# Patient Record
Sex: Female | Born: 1950 | Race: White | Hispanic: No | Marital: Married | Smoking: Never smoker
Health system: Southern US, Community
[De-identification: ages and names within clinical notes are randomized; demographics above are authoritative.]

## PROBLEM LIST (undated history)

## (undated) DIAGNOSIS — I10 Essential (primary) hypertension: Secondary | ICD-10-CM

## (undated) DIAGNOSIS — B019 Varicella without complication: Secondary | ICD-10-CM

## (undated) DIAGNOSIS — F419 Anxiety disorder, unspecified: Secondary | ICD-10-CM

## (undated) DIAGNOSIS — Z923 Personal history of irradiation: Secondary | ICD-10-CM

## (undated) DIAGNOSIS — J45909 Unspecified asthma, uncomplicated: Secondary | ICD-10-CM

## (undated) DIAGNOSIS — K219 Gastro-esophageal reflux disease without esophagitis: Secondary | ICD-10-CM

## (undated) DIAGNOSIS — E785 Hyperlipidemia, unspecified: Secondary | ICD-10-CM

## (undated) DIAGNOSIS — Z95828 Presence of other vascular implants and grafts: Secondary | ICD-10-CM

## (undated) DIAGNOSIS — F32A Depression, unspecified: Secondary | ICD-10-CM

## (undated) DIAGNOSIS — T7840XA Allergy, unspecified, initial encounter: Secondary | ICD-10-CM

## (undated) DIAGNOSIS — M1990 Unspecified osteoarthritis, unspecified site: Secondary | ICD-10-CM

## (undated) DIAGNOSIS — C259 Malignant neoplasm of pancreas, unspecified: Secondary | ICD-10-CM

## (undated) DIAGNOSIS — F329 Major depressive disorder, single episode, unspecified: Secondary | ICD-10-CM

## (undated) DIAGNOSIS — K635 Polyp of colon: Secondary | ICD-10-CM

## (undated) DIAGNOSIS — B029 Zoster without complications: Secondary | ICD-10-CM

## (undated) HISTORY — DX: Depression, unspecified: F32.A

## (undated) HISTORY — DX: Essential (primary) hypertension: I10

## (undated) HISTORY — DX: Hyperlipidemia, unspecified: E78.5

## (undated) HISTORY — DX: Unspecified asthma, uncomplicated: J45.909

## (undated) HISTORY — PX: BREAST REDUCTION SURGERY: SHX8

## (undated) HISTORY — DX: Unspecified osteoarthritis, unspecified site: M19.90

## (undated) HISTORY — DX: Varicella without complication: B01.9

## (undated) HISTORY — DX: Allergy, unspecified, initial encounter: T78.40XA

## (undated) HISTORY — DX: Polyp of colon: K63.5

## (undated) HISTORY — DX: Major depressive disorder, single episode, unspecified: F32.9

## (undated) HISTORY — PX: OTHER SURGICAL HISTORY: SHX169

---

## 2000-03-17 ENCOUNTER — Encounter: Admission: RE | Admit: 2000-03-17 | Discharge: 2000-03-17 | Payer: Self-pay | Admitting: Obstetrics and Gynecology

## 2000-03-17 ENCOUNTER — Encounter: Payer: Self-pay | Admitting: Obstetrics and Gynecology

## 2000-05-06 ENCOUNTER — Other Ambulatory Visit: Admission: RE | Admit: 2000-05-06 | Discharge: 2000-05-06 | Payer: Self-pay | Admitting: Obstetrics and Gynecology

## 2000-05-06 ENCOUNTER — Encounter (INDEPENDENT_AMBULATORY_CARE_PROVIDER_SITE_OTHER): Payer: Self-pay

## 2001-04-22 ENCOUNTER — Ambulatory Visit (HOSPITAL_COMMUNITY): Admission: RE | Admit: 2001-04-22 | Discharge: 2001-04-22 | Payer: Self-pay | Admitting: Obstetrics and Gynecology

## 2001-04-22 ENCOUNTER — Encounter (INDEPENDENT_AMBULATORY_CARE_PROVIDER_SITE_OTHER): Payer: Self-pay

## 2002-02-18 ENCOUNTER — Encounter: Admission: RE | Admit: 2002-02-18 | Discharge: 2002-02-18 | Payer: Self-pay | Admitting: Obstetrics and Gynecology

## 2002-02-18 ENCOUNTER — Encounter: Payer: Self-pay | Admitting: Obstetrics and Gynecology

## 2003-03-28 ENCOUNTER — Encounter: Admission: RE | Admit: 2003-03-28 | Discharge: 2003-03-28 | Payer: Self-pay | Admitting: Obstetrics and Gynecology

## 2003-03-28 ENCOUNTER — Encounter: Payer: Self-pay | Admitting: Obstetrics and Gynecology

## 2003-12-02 HISTORY — PX: HYSTEROSCOPY: SHX211

## 2005-08-21 ENCOUNTER — Encounter: Admission: RE | Admit: 2005-08-21 | Discharge: 2005-08-21 | Payer: Self-pay | Admitting: Obstetrics and Gynecology

## 2007-04-07 ENCOUNTER — Encounter: Admission: RE | Admit: 2007-04-07 | Discharge: 2007-04-07 | Payer: Self-pay | Admitting: Family Medicine

## 2008-09-15 ENCOUNTER — Encounter: Admission: RE | Admit: 2008-09-15 | Discharge: 2008-09-15 | Payer: Self-pay | Admitting: Family Medicine

## 2011-04-11 ENCOUNTER — Other Ambulatory Visit: Payer: Self-pay | Admitting: Obstetrics & Gynecology

## 2011-04-11 DIAGNOSIS — Z1231 Encounter for screening mammogram for malignant neoplasm of breast: Secondary | ICD-10-CM

## 2011-04-18 ENCOUNTER — Ambulatory Visit
Admission: RE | Admit: 2011-04-18 | Discharge: 2011-04-18 | Disposition: A | Discharge status: Home / Self Care | Payer: Medicare HMO | Source: Ambulatory Visit | Attending: Obstetrics & Gynecology | Admitting: Obstetrics & Gynecology

## 2011-04-18 DIAGNOSIS — Z1231 Encounter for screening mammogram for malignant neoplasm of breast: Secondary | ICD-10-CM

## 2011-04-18 NOTE — H&P (Signed)
[facility]  Patient:    DYMPHNA, Sonya Larson                        MRN: [id number] Adm. Date:  04/22/01 Attending:  Lenoard Aden, M.D.                         History and Physical  CHIEF COMPLAINT:              Postmenopausal bleeding.  HISTORY OF PRESENT ILLNESS:   Patient is a 60 year old white female G2, P2 with known history of symptomatic pelvic relaxation, rectocele, cystocele who presents with dysfunctional uterine bleeding.  Has a history of a normal endometrial biopsy and a saline sonohysterography which is consistent with posterior uterine thickening.  PAST MEDICAL HISTORY:         Remarkable for hypertension and asthma.  MEDICATIONS:                  Diovan, Claritin, Flovent, Serevent.  ALLERGIES:                    No known drug allergies.  FAMILY HISTORY:               Otherwise noncontributory.  PAST OBSTETRICAL HISTORY:     Two spontaneous vaginal deliveries.  PAST SURGICAL HISTORY:        None.  PHYSICAL EXAMINATION  GENERAL:                      She is a well-developed, well-nourished white female in no apparent distress.  HEENT:                        Normal.  LUNGS:                        Clear.  HEART:                        Regular rate and rhythm.  ABDOMEN:                      Soft, nontender.  PELVIC:                       Grade I cystocele, rectocele, and small amount of uterine descensus.  Uterus is small and anteflexed.  No adnexal masses are appreciated.  EXTREMITIES:                  No cords.  NEUROLOGIC:                   Nonfocal.  IMPRESSION:                   Postmenopausal bleeding with thickened endometrium, normal EBX, questionable structural lesion on saline sonohysterography.  PLAN:                         Proceed with diagnostic hysteroscopy/resectoscope.  Risks of anesthesia, infection, bleeding, injury to abdominal organs, need for repair is discussed.  Risks of uterine perforation  noted.  Patient acknowledges and will proceed with hysteroscopy and D&C. DD:  04/22/01 TD:  04/22/01 Job: [id number]

## 2011-04-18 NOTE — Op Note (Signed)
[facility]  Patient:    Sonya Larson, Sonya Larson                      MRN: [id number] Proc. Date: 04/22/01 Adm. Date:  [id number] Attending:  Lenoard Aden CC:         [facility]   Operative Report  PREOPERATIVE DIAGNOSIS:         Postmenopausal bleeding.  POSTOPERATIVE DIAGNOSIS:        Postoperative thickened endometrioma.  OPERATION:                      Diagnostic hysterectomy with dilatation and curettage.  SURGEON:                        Lenoard Aden, M.D.  ANESTHESIA:                     MAC with paracervical.  ESTIMATED BLOOD LOSS:           Less than 50 cc.  COMPLICATIONS:                  None.  FLUID DEFICIT:                  O.  DISPOSITION:                    Patient to recovery in good condition.  FINDINGS:                       Thickened endometrium.  Copious amounts of tissue on D&C specimen.  Normal tubal ostia.  No obvious structural intrauterine lesions.  DESCRIPTION OF PROCEDURE:       After being apprised of the risks of anesthesia, infection, bleeding, injury to abdominal organs, need for repair, uterine perforation with bowel and bladder injury and need for repair noted, the patient acknowledges.  Consents are signed.  The patient is brought to the operating room where she was administered IV sedation without difficulty. Prepped and draped in the usual sterile fashion.  Paracervical block placed using 25 cc of dilute Xylocaine solution in the standard fashion and dilute Pitressin solution, 12 cc placed in the cervicovaginal junction in the usual fashion.  At this time the anterior lip of the cervix was grasped using a single-tooth tenaculum.  The uterus sounded to 10 cm, dilated easily up to a #27 Pratt dilator.  Diagnostic hysteroscope placed through visualizing a very lush thickened endometrium but no obvious structural lesions.  D&C using a serrated Kevorkian curet is performed revealing copious amounts of  tissue for the D&C specimen.  Revisualization with the hysteroscope reveals an otherwise normal endometrial cavity and normal tubal ostia.  Fluid deficit is 0 as noted.  The procedure was terminated.  All instruments were removed.  The patient tolerated the procedure well and was transferred to recovery in good condition. DD:  04/22/01 TD:  04/22/01 Job: [id number]

## 2011-11-05 ENCOUNTER — Other Ambulatory Visit: Payer: Self-pay | Admitting: Occupational Medicine

## 2011-11-05 ENCOUNTER — Ambulatory Visit
Admission: RE | Admit: 2011-11-05 | Discharge: 2011-11-05 | Disposition: A | Discharge status: Home / Self Care | Payer: Medicare HMO | Source: Ambulatory Visit | Attending: Occupational Medicine | Admitting: Occupational Medicine

## 2011-11-05 DIAGNOSIS — Z0000 Encounter for general adult medical examination without abnormal findings: Secondary | ICD-10-CM

## 2013-01-26 ENCOUNTER — Other Ambulatory Visit: Payer: Self-pay

## 2013-01-26 DIAGNOSIS — Z1231 Encounter for screening mammogram for malignant neoplasm of breast: Secondary | ICD-10-CM

## 2013-02-28 ENCOUNTER — Ambulatory Visit
Admission: RE | Admit: 2013-02-28 | Discharge: 2013-02-28 | Disposition: A | Discharge status: Home / Self Care | Payer: BC Managed Care – PPO | Source: Ambulatory Visit

## 2013-02-28 DIAGNOSIS — Z1231 Encounter for screening mammogram for malignant neoplasm of breast: Secondary | ICD-10-CM

## 2013-12-21 ENCOUNTER — Telehealth: Payer: Self-pay | Admitting: Family Medicine

## 2013-12-21 NOTE — Telephone Encounter (Signed)
Pt used to be a pt of yours at [facility]. Would like to know if you will accept her back as a pt?

## 2013-12-21 NOTE — Telephone Encounter (Signed)
done

## 2013-12-21 NOTE — Telephone Encounter (Signed)
yes

## 2014-01-23 ENCOUNTER — Encounter: Payer: Self-pay | Admitting: Family Medicine

## 2014-01-23 ENCOUNTER — Ambulatory Visit (INDEPENDENT_AMBULATORY_CARE_PROVIDER_SITE_OTHER): Payer: BC Managed Care – PPO | Admitting: Family Medicine

## 2014-01-23 VITALS — BP 124/88 | HR 90 | Temp 98.6°F | Ht 63.5 in | Wt 210.0 lb

## 2014-01-23 DIAGNOSIS — J45998 Other asthma: Secondary | ICD-10-CM | POA: Insufficient documentation

## 2014-01-23 DIAGNOSIS — M79672 Pain in left foot: Secondary | ICD-10-CM

## 2014-01-23 DIAGNOSIS — Z860100 Personal history of colon polyps, unspecified: Secondary | ICD-10-CM

## 2014-01-23 DIAGNOSIS — Z8659 Personal history of other mental and behavioral disorders: Secondary | ICD-10-CM

## 2014-01-23 DIAGNOSIS — E669 Obesity, unspecified: Secondary | ICD-10-CM | POA: Insufficient documentation

## 2014-01-23 DIAGNOSIS — E785 Hyperlipidemia, unspecified: Secondary | ICD-10-CM

## 2014-01-23 DIAGNOSIS — J45909 Unspecified asthma, uncomplicated: Secondary | ICD-10-CM

## 2014-01-23 DIAGNOSIS — M79609 Pain in unspecified limb: Secondary | ICD-10-CM

## 2014-01-23 DIAGNOSIS — M1990 Unspecified osteoarthritis, unspecified site: Secondary | ICD-10-CM | POA: Insufficient documentation

## 2014-01-23 DIAGNOSIS — I10 Essential (primary) hypertension: Secondary | ICD-10-CM

## 2014-01-23 NOTE — Patient Instructions (Signed)
Schedule complete physical. 

## 2014-01-23 NOTE — Progress Notes (Signed)
   Subjective:    Patient ID: Sonya Larson, female    DOB: [birth year], 63 y.o.   MRN: [id number]  HPI Patient seen to establish care. Past medical history significant for asthma which is persistent and controlled, recurrent depression, hyperlipidemia, hypertension, history of benign colon polyps. She had colonoscopy 2014. Patient takes Symbicort for asthma but takes very low dosage of only one puff once daily. She takes lisinopril and HCTZ for hypertension and blood pressure been well controlled. No recent dizziness. She takes atorvastatin for hyperlipidemia. She has some arthralgias but no myalgias. No cardiac history.  Patient is married. She is retired [employer] social research officer. Nonsmoker. She has 4 grandchildren. Family history significant for parents with hypertension. Father had a brain tumor. Grandparent with pancreatic cancer.  She's had some arthritic issues mostly involving her ankles and feet. She's had some persistent left foot pain and is requesting podiatry referral. Her foot pain is poorly localized. She tried non-custom orthotic which has helped slightly.  Past Medical History  Diagnosis Date  . Asthma   . Arthritis   . Chicken pox   . Depression   . Allergy   . Hypertension   . Hyperlipidemia   . Polyp of colon    Past Surgical History  Procedure Laterality Date  . Breast reduction surgery      reports that she has never smoked. She does not have any smokeless tobacco history on file. She reports that she drinks about 0.5 ounces of alcohol per week. She reports that she does not use illicit drugs. family history includes Alcohol abuse in her father; Arthritis in her mother; Cancer in her father; Hypertension in her mother. No Known Allergies    Review of Systems  Constitutional: Negative for fatigue.  Eyes: Negative for visual disturbance.  Respiratory: Negative for cough, chest tightness, shortness of breath and wheezing.   Cardiovascular: Negative for  chest pain, palpitations and leg swelling.  Endocrine: Negative for polydipsia and polyuria.  Genitourinary: Negative for dysuria.  Neurological: Negative for dizziness, seizures, syncope, weakness, light-headedness and headaches.       Objective:   Physical Exam  Constitutional: She is oriented to person, place, and time. She appears well-developed and well-nourished.  Neck: Neck supple. No thyromegaly present.  Cardiovascular: Normal rate and regular rhythm.   Pulmonary/Chest: Effort normal and breath sounds normal. No respiratory distress. She has no wheezes. She has no rales.  Musculoskeletal: She exhibits no edema.  Lymphadenopathy:    She has no cervical adenopathy.  Neurological: She is alert and oriented to person, place, and time.  Psychiatric: She has a normal mood and affect. Her behavior is normal. Thought content normal.          Assessment & Plan:  #1 hypertension. Well controlled. Continue current medications #2 hyperlipidemia. Continue atorvastatin. We'll plan to check labs at followup in the fall  #3 persistent asthma well controlled. She is on substandard dose of Symbicort and we discussed increasing this if she has any persistent cough or wheezing. Consider Prevnar 13 at followup  #4 osteoarthritis involving multiple joints. Needs to lose some weight. Discussed further at physical #5 benign colon polyps #6 poorly localized left foot pain. Patient requesting names of podiatrists and these were given

## 2014-01-23 NOTE — Progress Notes (Signed)
Pre visit review using our clinic review tool, if applicable. No additional management support is needed unless otherwise documented below in the visit note. 

## 2014-01-24 ENCOUNTER — Telehealth: Payer: Self-pay | Admitting: Family Medicine

## 2014-01-24 NOTE — Telephone Encounter (Signed)
Relevant patient education assigned to patient using Emmi. ° °

## 2014-03-15 ENCOUNTER — Encounter: Payer: Self-pay | Admitting: Family Medicine

## 2014-03-15 ENCOUNTER — Other Ambulatory Visit: Payer: Self-pay

## 2014-03-15 MED ORDER — LISINOPRIL 40 MG PO TABS: 40.0000 mg | ORAL_TABLET | Freq: Every day | ORAL | Status: DC

## 2014-03-15 MED ORDER — HYDROCHLOROTHIAZIDE 25 MG PO TABS: 25.0000 mg | ORAL_TABLET | Freq: Every day | ORAL | Status: DC

## 2014-03-15 MED ORDER — ATORVASTATIN CALCIUM 10 MG PO TABS: 10.0000 mg | ORAL_TABLET | Freq: Every day | ORAL | Status: DC

## 2014-05-01 DIAGNOSIS — B029 Zoster without complications: Secondary | ICD-10-CM

## 2014-05-01 HISTORY — DX: Zoster without complications: B02.9

## 2014-05-09 ENCOUNTER — Telehealth: Payer: Self-pay | Admitting: Family Medicine

## 2014-05-09 ENCOUNTER — Ambulatory Visit (INDEPENDENT_AMBULATORY_CARE_PROVIDER_SITE_OTHER): Payer: BC Managed Care – PPO | Admitting: Family

## 2014-05-09 ENCOUNTER — Encounter: Payer: Self-pay | Admitting: Family

## 2014-05-09 VITALS — BP 140/72 | HR 84 | Temp 98.6°F | Wt 208.0 lb

## 2014-05-09 DIAGNOSIS — B029 Zoster without complications: Secondary | ICD-10-CM

## 2014-05-09 MED ORDER — OXYCODONE-ACETAMINOPHEN 5-325 MG PO TABS: 1.0000 | ORAL_TABLET | Freq: Three times a day (TID) | ORAL | Status: DC | PRN

## 2014-05-09 MED ORDER — ACYCLOVIR 400 MG PO TABS: 400.0000 mg | ORAL_TABLET | Freq: Every day | ORAL | Status: DC

## 2014-05-09 NOTE — Progress Notes (Signed)
Subjective:    Patient ID: Sonya Larson, female    DOB: [birth year], 63 y.o.   MRN: [id number]  HPI  63 yo caucasian female presents with c/o painful blistering lesions in a linear pattern along right clavicular-scapular region. Symptoms began last night following 2-3 days of soreness on R side of neck near the affected area. She has not tried anything for relief.   She had the Shingles vaccine on 10/2009.   Review of Systems  Constitutional: Negative for chills.  HENT: Negative.   Eyes: Negative.   Respiratory: Negative.   Cardiovascular: Negative.   Gastrointestinal: Negative.   Endocrine: Negative.   Genitourinary: Negative.   Musculoskeletal: Negative.   Skin: Positive for rash.  Allergic/Immunologic: Negative.   Neurological: Negative.   Hematological: Negative.   Psychiatric/Behavioral: Negative.    Past Medical History  Diagnosis Date  . Asthma   . Arthritis   . Chicken pox   . Depression   . Allergy   . Hypertension   . Hyperlipidemia   . Polyp of colon     History   Social History  . Marital Status: Married    Spouse Name: N/A    Number of Children: N/A  . Years of Education: N/A   Occupational History  . Not on file.   Social History Main Topics  . Smoking status: Never Smoker   . Smokeless tobacco: Not on file  . Alcohol Use: 0.5 oz/week    1 drink(s) per week  . Drug Use: No  . Sexual Activity: Not on file   Other Topics Concern  . Not on file   Social History Narrative  . No narrative on file    Past Surgical History  Procedure Laterality Date  . Breast reduction surgery      Family History  Problem Relation Age of Onset  . Arthritis Mother   . Hypertension Mother   . Alcohol abuse Father   . Cancer Father     brain tumor    No Known Allergies  Current Outpatient Prescriptions on File Prior to Visit  Medication Sig Dispense Refill  . atorvastatin (LIPITOR) 10 MG tablet Take 1 tablet (10 mg total) by mouth daily.  90  tablet  1  . budesonide-formoterol (SYMBICORT) 160-4.5 MCG/ACT inhaler Inhale 1 puff into the lungs daily.      . cetirizine (ZYRTEC) 10 MG tablet Take 10 mg by mouth daily.      . hydrochlorothiazide (HYDRODIURIL) 25 MG tablet Take 1 tablet (25 mg total) by mouth daily.  90 tablet  1  . lisinopril (PRINIVIL,ZESTRIL) 40 MG tablet Take 1 tablet (40 mg total) by mouth daily.  90 tablet  1  . Multiple Vitamins-Minerals ([employer] SPECTRAVITE ADULT 50+) TABS Take by mouth daily.       No current facility-administered medications on file prior to visit.    BP 140/72  Pulse 84  Temp(Src) 98.6 F (37 C) (Oral)  Wt 208 lb (94.348 kg)      Objective:   Physical Exam  Constitutional: She is oriented to person, place, and time. She appears well-developed and well-nourished. No distress.  HENT:  Head: Normocephalic and atraumatic.  Neck: Normal range of motion. Neck supple.  Cardiovascular: Normal rate and regular rhythm.   Pulmonary/Chest: Effort normal and breath sounds normal.  Musculoskeletal: She exhibits no edema.  Lymphadenopathy:    She has no cervical adenopathy.  Neurological: She is alert and oriented to person, place, and time.  Skin:  Rash noted. She is not diaphoretic.  Erythematous blistering papules in linear sequence following Right subclavicular-scapular dermatome.   Psychiatric: She has a normal mood and affect. Her behavior is normal. Judgment and thought content normal.       Assessment & Plan:  Shaun was seen today for no specified reason.  Diagnoses and associated orders for this visit:  Shingles - acyclovir (ZOVIRAX) 400 MG tablet; Take 1 tablet (400 mg total) by mouth 5 (five) times daily. - oxyCODONE-acetaminophen (ROXICET) 5-325 MG per tablet; Take 1 tablet by mouth every 8 (eight) hours as needed for severe pain.   Call the office with any questions or concerns. Recheck as needed.

## 2014-05-09 NOTE — Patient Instructions (Signed)
Shingles Shingles (herpes zoster) is an infection that is caused by the same virus that causes chickenpox (varicella). The infection causes a painful skin rash and fluid-filled blisters, which eventually break open, crust over, and heal. It may occur in any area of the body, but it usually affects only one side of the body or face. The pain of shingles usually lasts about 1 month. However, some people with shingles may develop long-term (chronic) pain in the affected area of the body. Shingles often occurs many years after the person had chickenpox. It is more common:  In people older than 50 years.  In people with weakened immune systems, such as those with HIV, AIDS, or cancer.  In people taking medicines that weaken the immune system, such as transplant medicines.  In people under great stress. CAUSES  Shingles is caused by the varicella zoster virus (VZV), which also causes chickenpox. After a person is infected with the virus, it can remain in the person's body for years in an inactive state (dormant). To cause shingles, the virus reactivates and breaks out as an infection in a nerve root. The virus can be spread from person to person (contagious) through contact with open blisters of the shingles rash. It will only spread to people who have not had chickenpox. When these people are exposed to the virus, they may develop chickenpox. They will not develop shingles. Once the blisters scab over, the person is no longer contagious and cannot spread the virus to others. SYMPTOMS  Shingles shows up in stages. The initial symptoms may be pain, itching, and tingling in an area of the skin. This pain is usually described as burning, stabbing, or throbbing.In a few days or weeks, a painful red rash will appear in the area where the pain, itching, and tingling were felt. The rash is usually on one side of the body in a band or belt-like pattern. Then, the rash usually turns into fluid-filled blisters. They  will scab over and dry up in approximately 2 3 weeks. Flu-like symptoms may also occur with the initial symptoms, the rash, or the blisters. These may include:  Fever.  Chills.  Headache.  Upset stomach. DIAGNOSIS  Your caregiver will perform a skin exam to diagnose shingles. Skin scrapings or fluid samples may also be taken from the blisters. This sample will be examined under a microscope or sent to a lab for further testing. TREATMENT  There is no specific cure for shingles. Your caregiver will likely prescribe medicines to help you manage the pain, recover faster, and avoid long-term problems. This may include antiviral drugs, anti-inflammatory drugs, and pain medicines. HOME CARE INSTRUCTIONS   Take a cool bath or apply cool compresses to the area of the rash or blisters as directed. This may help with the pain and itching.   Only take over-the-counter or prescription medicines as directed by your caregiver.   Rest as directed by your caregiver.  Keep your rash and blisters clean with mild soap and cool water or as directed by your caregiver.  Do not pick your blisters or scratch your rash. Apply an anti-itch cream or numbing creams to the affected area as directed by your caregiver.  Keep your shingles rash covered with a loose bandage (dressing).  Avoid skin contact with:  Babies.   Pregnant women.   Children with eczema.   Elderly people with transplants.   People with chronic illnesses, such as leukemia or AIDS.   Wear loose-fitting clothing to help ease   the pain of material rubbing against the rash.  Keep all follow-up appointments with your caregiver.If the area involved is on your face, you may receive a referral for follow-up to a specialist, such as an eye doctor (ophthalmologist) or an ear, nose, and throat (ENT) doctor. Keeping all follow-up appointments will help you avoid eye complications, chronic pain, or disability.  SEEK IMMEDIATE MEDICAL  CARE IF:   You have facial pain, pain around the eye area, or loss of feeling on one side of your face.  You have ear pain or ringing in your ear.  You have loss of taste.  Your pain is not relieved with prescribed medicines.   Your redness or swelling spreads.   You have more pain and swelling.  Your condition is worsening or has changed.   You have a feveror persistent symptoms for more than 2 3 days.  You have a fever and your symptoms suddenly get worse. MAKE SURE YOU:  Understand these instructions.  Will watch your condition.  Will get help right away if you are not doing well or get worse. Document Released: 11/17/2005 Document Revised: 08/11/2012 Document Reviewed: 07/01/2012 Exit[employer] Patient Information 2014 [employer].  

## 2014-05-09 NOTE — Telephone Encounter (Signed)
Noted  

## 2014-05-09 NOTE — Telephone Encounter (Signed)
Patient Information:  Caller Name: Vira  Phone: [phone]  Patient: Sonya Larson, Sonya Larson  Gender: Female  DOB: June 15, 1951  Age: 63 Years  PCP: Carolann Littler (Family Practice)  Office Follow Up:  Does the office need to follow up with this patient?: No  Instructions For The Office: N/A   Symptoms  Reason For Call & Symptoms: Pt states she thinks she has shingles.  Rash located on the right side of body at the breast, around to back and shoulder, small blisters.  Reviewed Health History In EMR: Yes  Reviewed Medications In EMR: Yes  Reviewed Allergies In EMR: Yes  Reviewed Surgeries / Procedures: Yes  Date of Onset of Symptoms: 05/08/2014  Guideline(s) Used:  Rash or Redness - Widespread  Disposition Per Guideline:   See Today in Office  Reason For Disposition Reached:   Patient wants to be seen  Advice Given:  Call Back If:   You become worse  Patient Will Follow Care Advice:  YES  Appointment Scheduled:  05/09/2014 14:00:00 Appointment Scheduled Provider:  Roxy Cedar [facility])

## 2014-05-09 NOTE — Progress Notes (Signed)
Pre visit review using our clinic review tool, if applicable. No additional management support is needed unless otherwise documented below in the visit note. 

## 2014-05-10 ENCOUNTER — Encounter: Payer: Self-pay | Admitting: Family

## 2014-06-07 ENCOUNTER — Telehealth: Payer: Self-pay | Admitting: Obstetrics & Gynecology

## 2014-06-07 DIAGNOSIS — N950 Postmenopausal bleeding: Secondary | ICD-10-CM

## 2014-06-07 NOTE — Telephone Encounter (Signed)
Spoke with patient. Advised spoke with Dr.Lathrop and patient will need OV early next week after returning from vacation. Patient agreeable. Appointment scheduled for 7/13 at 10:30 am. Possible EMB discussed with patient. Advised will be up to Dr.Lathrop and will be discussed at office visit if needed. Motrin instructions given. Motrin=Advil=Ibuprofen.800 mg (Can purchase over the counter, you will need four 200 mg pills) every 8 hours as needed.  Take with food. Patient agreeable and verbalizes understanding. Posbbile EMB order placed for precert.  Routing to provider for final review. Patient agreeable to disposition. Will close encounter

## 2014-06-07 NOTE — Telephone Encounter (Signed)
Patient had some light bleeding starting yesterday and stil today. Went through menopause 9 years ago. Is currently out of town and will not be in until Sunday.

## 2014-06-12 ENCOUNTER — Ambulatory Visit (INDEPENDENT_AMBULATORY_CARE_PROVIDER_SITE_OTHER): Payer: BC Managed Care – PPO | Admitting: Gynecology

## 2014-06-12 VITALS — BP 118/82 | HR 80 | Resp 14 | Wt 201.0 lb

## 2014-06-12 DIAGNOSIS — N950 Postmenopausal bleeding: Secondary | ICD-10-CM

## 2014-06-12 DIAGNOSIS — N882 Stricture and stenosis of cervix uteri: Secondary | ICD-10-CM

## 2014-06-12 MED ORDER — MISOPROSTOL 200 MCG PO TABS: ORAL_TABLET | ORAL | Status: DC

## 2014-06-12 NOTE — Patient Instructions (Signed)
sonohysterogram 06/13/14 at 2:45 Take motrin and cytotec before procedure

## 2014-06-12 NOTE — Progress Notes (Signed)
Subjective:    Sonya Larson is a 63 y.o., post-menopausal female who presents for concerns regarding vaginal bleeding. She has been menopausal for 9 years. Bleeding was after sex but it has never occurred after sex before. Currently on no HRT and has never been.  Bleeding is described as started as bright red, spot on underwear about 2" long, lasted for 2d, turned brown after the first day and has occurred no  time before this. Workup to date: none.  Pt recent stress, had shingles 4w ago, off zovarax.  Pt reports unexplained weight loss-8# over 4w but decrease in appetite due to indigestion. + nausea no vomiting. No recent change in medication. Pt was taking motrin for shingles pain but no longer.  Pt states using vaginal lubricants such as lotions, no sex after bleeding episode.  Pt did confirm vaginal in source  Menstrual History: OB History   Grav Para Term Preterm Abortions TAB SAB Ect Mult Living                  Menarche age: [place] No LMP recorded. Patient is postmenopausal.    The following portions of the patient's history were reviewed and updated as appropriate: allergies, current medications, past family history, past medical history, past social history, past surgical history and problem list.  Review of Systems Constitutional: positive for anorexia and weight loss Gastrointestinal: positive for dyspepsia Genitourinary:positive for bleeding Endocrine: negative    Objective:    BP 118/82  Pulse 80  Resp 14  Wt 201 lb (91.173 kg) General appearance: alert, cooperative and appears stated age Abdomen: soft, non-tender; bowel sounds normal; no masses,  no organomegaly Pelvic:   Pelvic: External genitalia:  no lesions              Urethra:  normal appearing urethra with no masses, tenderness or lesions              Bartholins and Skenes: normal                 Vagina: normal appearing vagina with normal color and discharge, no lesions              Cervix: post-menopausal,  stenotic os                      Bimanual Exam:  Uterus:  uterus is normal size, shape, consistency and nontender                                      Adnexa: normal adnexa in size, nontender and no masses                                       Assessment:    Postmenopausal bleeding   Plan:    Abdominal ultrasound. All questions answered. Endometrial biopsy - see separate procedure note.   Pt consented to EMB to evaluate lining.  Risks of bleeding, infection and uterine perforation reviewed and accepted. Speculum placed, cervix cleansed with betadine x3, xylocaine jelly placed anterior lip, single tooth tenaculum placed, os stenotic and os finder used to dilate.  pipelle advanced to 7cm, scant tissue.  Second attempt unsuccessful-aborted. Tissue held pending [facility] tomorrow 2:45, we will pretreat cervix with cytotec and motrin Pt tolerated well, no uterine tenderness Questions addressed  Addendum: U/S WITH ENDOMETRIAL DEFECT, SAMPLE DISCARDED

## 2014-06-13 ENCOUNTER — Encounter: Payer: Self-pay | Admitting: Gynecology

## 2014-06-13 ENCOUNTER — Ambulatory Visit (INDEPENDENT_AMBULATORY_CARE_PROVIDER_SITE_OTHER): Payer: BC Managed Care – PPO | Admitting: Gynecology

## 2014-06-13 ENCOUNTER — Other Ambulatory Visit: Payer: Self-pay | Admitting: Gynecology

## 2014-06-13 ENCOUNTER — Ambulatory Visit (INDEPENDENT_AMBULATORY_CARE_PROVIDER_SITE_OTHER): Payer: BC Managed Care – PPO

## 2014-06-13 VITALS — BP 130/88 | Resp 18 | Wt 202.0 lb

## 2014-06-13 DIAGNOSIS — N950 Postmenopausal bleeding: Secondary | ICD-10-CM

## 2014-06-13 DIAGNOSIS — N882 Stricture and stenosis of cervix uteri: Secondary | ICD-10-CM

## 2014-06-13 NOTE — Patient Instructions (Signed)
D&C hysteroscopy with tru clear  [email or web address] webmd

## 2014-06-13 NOTE — Progress Notes (Signed)
      Pt here for PUS for PMB and suspected inadequate biopsy earlier this week.  Pt has taken cytotec to aid in better uterine sampling.  Pt states that she has had no further bleeding since initial and no bleeding after EMB this week. Images reviewed with pt.   There is a minimal amount of fluid noted in cavity that outlines a small endometrial defect on anterior wall, lining otherwise thin, Nabothian cyst noted that most likely affected passage of pipelle 2nd time. Uterus normal-7.6x4.6x4cm, eMS 2.7 Ovaries atrophic appearing, no free fluid. Pt here with husband to discuss. Recommend assessment of cavity in OR, suspect minimal tissue obtained was inadequate. Pt had D&C in past between her 2 children.  Procedure outlined to pt.  Risks of bleeding, infection, uterine perforation discussed.  Pt aware that perforation may require laparoscopy to assess and treat.  She will have cytotec pre-op to decrease this risk.  Distending media NS monitoring intra-op, risks of DVT/PE reviewed.  Post-operative course as well as signs to be wary of reviewed. Questions addressed, pt agreeable to surgery. Pt concerned re uterine cancer, discussed need to properly evaluate to make that determination, overall risk of cancer and good prognosis reviewed. Questions addressed.  BP 130/88  Resp 18  Wt 202 lb (91.627 kg) General appearance: alert, cooperative and appears stated age Lungs: clear to auscultation bilaterally Heart: regular rate and rhythm, S1, S2 normal, no murmur, click, rub or gallop Abdomen: soft, non-tender; bowel sounds normal; no masses,  no organomegaly Pelvic: external genitalia normal, no adnexal masses or tenderness, no cervical motion tenderness, uterus normal size, shape, and consistency, vagina normal without discharge and cervix stenotic Inguinal LN negative  74m spent counseling, >50% face to face

## 2014-06-14 MED ORDER — MISOPROSTOL 200 MCG PO TABS: ORAL_TABLET | ORAL | Status: DC

## 2014-06-16 ENCOUNTER — Encounter: Payer: Self-pay | Admitting: Gynecology

## 2014-06-16 ENCOUNTER — Telehealth: Payer: Self-pay | Admitting: Gynecology

## 2014-06-16 ENCOUNTER — Encounter: Payer: Self-pay | Admitting: Family

## 2014-06-16 NOTE — Telephone Encounter (Signed)
Patient calling to schedule a surgery. She was seen on Tuesday, 06/13/14, and says she was told she would be hearing from [place] the very next day to get this scheduled.

## 2014-06-16 NOTE — Telephone Encounter (Signed)
Spoke to pt, she understands the time needed to schedule and the need to collect payment before, jsut very  anxoius since she knows that post-menopausal bleeding can be a sign of cancer, she is agreeable to pay upfront and will wait for call re time of OR

## 2014-06-16 NOTE — Telephone Encounter (Signed)
Answered call regarding scheduling. Patient states she has been waiting for two days. Apologized for miscommunication, it does take time for precert of insurance benefits and arrangements before scheduling. Patient upset that our office is even asking about payment. States she pays her bills. Advised this is not reflective to her, it is office policy to collect in advance (2 weeks)  for procedures/surgery.    Advised we may be able to get procedure schedule in less than 2 weeks. Patient states she might be "dead in 2 weeks."    Again states she pays her bills and she cant believe we are concerned with the bill before her health. Insists on speaking with Dr Charlies Constable personally.  Advise Dr Charlies Constable is still with patients but I will relay message to Dr Charlies Constable for her to call back at end of day.

## 2014-06-16 NOTE — Telephone Encounter (Signed)
Per note received from [facility] after reviewing this patients chart with Dr Charlies Constable, EMB was aborted. Patient did not have [place], she only had PUS. Patient responsibility for PUS $408.26 (due to deductible). Remaining deductible amount is $91.74. Patient responsibility for surgery is $186.05.  Called patient and advised of her out of pocket expense for PUS that was performed ($408.26) and her out of pocket for the surgeons portion of her surgery ($186.05). Advised patient that payment for the PUS is due and that payment for the surgeons costs is due 2 weeks prior to her scheduled surgery date. Advised that Gay Filler would call her regarding scheduling.  Patient response was that "I don't know if I don't like the fact that you are telling me this or if I don't like your office policy...what are you implying? I pay my bills and my health is my concern. I would like to speak with someone regarding scheduling". I told patient that I was not 'implying' anything, but wanted to make her aware of the financial aspect of the services performed and prospective services. Patient stated "my health is my concern" and insisted on speaking to someone regarding scheduling.  Passed call to [place].

## 2014-06-19 ENCOUNTER — Ambulatory Visit (INDEPENDENT_AMBULATORY_CARE_PROVIDER_SITE_OTHER): Payer: BC Managed Care – PPO | Admitting: Family Medicine

## 2014-06-19 ENCOUNTER — Encounter: Payer: Self-pay | Admitting: Family Medicine

## 2014-06-19 VITALS — BP 130/80 | HR 82 | Temp 98.6°F | Wt 201.0 lb

## 2014-06-19 DIAGNOSIS — K869 Disease of pancreas, unspecified: Secondary | ICD-10-CM

## 2014-06-19 DIAGNOSIS — R110 Nausea: Secondary | ICD-10-CM

## 2014-06-19 DIAGNOSIS — R634 Abnormal weight loss: Secondary | ICD-10-CM

## 2014-06-19 DIAGNOSIS — K8689 Other specified diseases of pancreas: Secondary | ICD-10-CM

## 2014-06-19 DIAGNOSIS — R109 Unspecified abdominal pain: Secondary | ICD-10-CM

## 2014-06-19 LAB — CBC WITH DIFFERENTIAL/PLATELET
Basophils Absolute: 0 10*3/uL (ref 0.0–0.1)
Basophils Relative: 0.5 % (ref 0.0–3.0)
Eosinophils Absolute: 0.2 10*3/uL (ref 0.0–0.7)
Eosinophils Relative: 3.6 % (ref 0.0–5.0)
HCT: 40.1 % (ref 36.0–46.0)
Hemoglobin: 13.5 g/dL (ref 12.0–15.0)
Lymphocytes Relative: 27.1 % (ref 12.0–46.0)
Lymphs Abs: 1.9 10*3/uL (ref 0.7–4.0)
MCHC: 33.8 g/dL (ref 30.0–36.0)
MCV: 87.6 fl (ref 78.0–100.0)
Monocytes Absolute: 0.6 10*3/uL (ref 0.1–1.0)
Monocytes Relative: 8.1 % (ref 3.0–12.0)
Neutro Abs: 4.2 10*3/uL (ref 1.4–7.7)
Neutrophils Relative %: 60.7 % (ref 43.0–77.0)
Platelets: 305 10*3/uL (ref 150.0–400.0)
RBC: 4.58 Mil/uL (ref 3.87–5.11)
RDW: 14.2 % (ref 11.5–15.5)
WBC: 6.8 10*3/uL (ref 4.0–10.5)

## 2014-06-19 NOTE — Progress Notes (Signed)
Pre visit review using our clinic review tool, if applicable. No additional management support is needed unless otherwise documented below in the visit note. 

## 2014-06-19 NOTE — Patient Instructions (Signed)

## 2014-06-19 NOTE — Progress Notes (Addendum)
   Subjective:    Patient ID: Sonya Larson, female    DOB: 10-Aug-1951, 63 y.o.   MRN: [id number]  HPI Patient seen with approximate 6 week history of abdominal pain, loss of appetite, and some modest weight loss. She had shingles about 5 weeks ago right thoracic region that is healing fairly well. She does have some mild numbness and mild persistent pain. Her abdominal pain actually preceded that for about one week. No clear trigger for pain. She has pain which is epigastric and right upper quadrant. Intermittent. She's lost 7 pounds since visit here in June.  Other pertinent history is that she had some recent vaginal spotting after 9 years menopause. Saw a gynecologist. Inadequate endometrial biopsy in patient scheduled for hysteroscopy.  Denies any recent exertional symptoms or chest pain. No dysphagia. No active GERD symptoms. No stool changes. No hematemesis.  Past Medical History  Diagnosis Date  . Asthma   . Arthritis   . Chicken pox   . Depression   . Allergy   . Hypertension   . Hyperlipidemia   . Polyp of colon    Past Surgical History  Procedure Laterality Date  . Breast reduction surgery      reports that she has never smoked. She does not have any smokeless tobacco history on file. She reports that she drinks about .5 ounces of alcohol per week. She reports that she does not use illicit drugs. family history includes Alcohol abuse in her father; Arthritis in her mother; Cancer in her father; Hypertension in her mother. No Known Allergies    Review of Systems  Constitutional: Positive for appetite change, fatigue and unexpected weight change. Negative for fever and chills.  Respiratory: Negative for cough and shortness of breath.   Cardiovascular: Negative for chest pain.  Gastrointestinal: Positive for nausea and abdominal pain. Negative for vomiting, diarrhea, constipation, blood in stool and abdominal distention.  Genitourinary: Negative for dysuria.         Objective:   Physical Exam  Constitutional: She appears well-developed and well-nourished.  Neck: Neck supple.  Cardiovascular: Normal rate and regular rhythm.   Pulmonary/Chest: Effort normal and breath sounds normal. No respiratory distress. She has no wheezes. She has no rales.  Abdominal: Soft. Bowel sounds are normal. She exhibits no distension and no mass. There is tenderness. There is no rebound and no guarding.  Patient has some mild tenderness epigastric and right upper quadrant  Skin: No rash noted.          Assessment & Plan:  Patient presents with several week history of loss of appetite, modest weight loss, epigastric and right upper quadrant pain and intermittent nausea. Start with labs with CBC, comprehensive metabolic panel, lipase. Set up abdominal ultrasound. Consider GI referral if above is unrevealing  MRI abdomen shows pancreatic mass worrisome for pancreatic cancer.  One enlarged lymph node.  Pt has been notified.  Will set set up referrals to general surgery and oncology.

## 2014-06-20 ENCOUNTER — Encounter: Payer: Self-pay | Admitting: Gynecology

## 2014-06-20 ENCOUNTER — Encounter: Payer: Self-pay | Admitting: *Deleted

## 2014-06-20 LAB — LIPASE: LIPASE: 54 U/L (ref 11.0–59.0)

## 2014-06-20 LAB — BASIC METABOLIC PANEL
BUN: 17 mg/dL (ref 6–23)
CO2: 31 mEq/L (ref 19–32)
CREATININE: 0.9 mg/dL (ref 0.4–1.2)
Calcium: 9.9 mg/dL (ref 8.4–10.5)
Chloride: 103 mEq/L (ref 96–112)
GFR: 63.58 mL/min (ref >60.00)
Glucose, Bld: 93 mg/dL (ref 70–99)
POTASSIUM: 4 meq/L (ref 3.5–5.1)
Sodium: 140 mEq/L (ref 135–145)

## 2014-06-20 LAB — HEPATIC FUNCTION PANEL
ALBUMIN: 4.3 g/dL (ref 3.5–5.2)
ALT: 35 U/L (ref 0–35)
AST: 33 U/L (ref 0–37)
Alkaline Phosphatase: 75 U/L (ref 39–117)
Bilirubin, Direct: 0.1 mg/dL (ref 0.0–0.3)
TOTAL PROTEIN: 7.4 g/dL (ref 6.0–8.3)
Total Bilirubin: 0.6 mg/dL (ref 0.2–1.2)

## 2014-06-20 NOTE — Telephone Encounter (Signed)
Sonya Larson contacted patient regarding bill. I was asked to speak with patient. I was put on speaker phone with patient and husband. They were both dissatisfied with our financial office policy, and did not want to pay for her surgery prior to the procedure and wanted to wait until she received her EOB from insurance. After a very lengthy phone call the patient expressed her desire to seek GYN care elsewhere and to not proceed with scheduling her surgery with Dr. Charlies Constable. I apologized to patient many times and after the phone call ended, I informed Dr. Charlies Constable of the conversation I had with patient and Dr. Charlies Constable agreed with decision to formally discharge patient from patient. Discharge letter created and waiting on Dr. Brion Aliment approval and then will mail certified and regular mail to patient.   Sent to provider for final review. Encounter closed.

## 2014-06-21 ENCOUNTER — Encounter: Payer: Self-pay | Admitting: Family Medicine

## 2014-06-23 ENCOUNTER — Telehealth: Payer: Self-pay

## 2014-06-23 ENCOUNTER — Ambulatory Visit
Admission: RE | Admit: 2014-06-23 | Discharge: 2014-06-23 | Disposition: A | Discharge status: Home / Self Care | Payer: BC Managed Care – PPO | Source: Ambulatory Visit | Attending: Family Medicine | Admitting: Family Medicine

## 2014-06-23 ENCOUNTER — Other Ambulatory Visit: Payer: BC Managed Care – PPO

## 2014-06-23 DIAGNOSIS — R109 Unspecified abdominal pain: Secondary | ICD-10-CM

## 2014-06-23 NOTE — Telephone Encounter (Signed)
Ultrasound results are in patient chart

## 2014-06-26 ENCOUNTER — Other Ambulatory Visit: Payer: Self-pay | Admitting: Family Medicine

## 2014-06-26 ENCOUNTER — Telehealth: Payer: Self-pay

## 2014-06-26 DIAGNOSIS — R1013 Epigastric pain: Secondary | ICD-10-CM

## 2014-06-26 NOTE — Telephone Encounter (Signed)
Pt has been notified.  We need to order "pancreatic protocol MRI" per radiologist.  Can we see how to get this ordered through EP[employer]?

## 2014-06-26 NOTE — Telephone Encounter (Signed)
Pt called about ultrasound results. Did you call her on Friday?

## 2014-06-26 NOTE — Telephone Encounter (Signed)
Ordered

## 2014-07-04 ENCOUNTER — Ambulatory Visit
Admission: RE | Admit: 2014-07-04 | Discharge: 2014-07-04 | Disposition: A | Discharge status: Home / Self Care | Payer: BC Managed Care – PPO | Source: Ambulatory Visit | Attending: Family Medicine | Admitting: Family Medicine

## 2014-07-04 DIAGNOSIS — R1013 Epigastric pain: Secondary | ICD-10-CM

## 2014-07-04 MED ORDER — GADOBENATE DIMEGLUMINE 529 MG/ML IV SOLN
18.0000 mL | Freq: Once | INTRAVENOUS | Status: AC | PRN
  Administered 2014-07-04: 18 mL via INTRAVENOUS

## 2014-07-06 ENCOUNTER — Telehealth: Payer: Self-pay | Admitting: Family Medicine

## 2014-07-06 ENCOUNTER — Encounter: Payer: Self-pay | Admitting: Family Medicine

## 2014-07-06 NOTE — Telephone Encounter (Signed)
Pt calling for results of MRI. Pt aware dr Elease Hashimoto out of office this week and someone to call her as soon as md reads results

## 2014-07-07 NOTE — Addendum Note (Signed)
Addended by: Eulas Post on: 07/07/2014 02:14 PM   Modules accepted: Orders

## 2014-07-10 ENCOUNTER — Telehealth: Payer: Self-pay | Admitting: Family Medicine

## 2014-07-10 ENCOUNTER — Telehealth: Payer: Self-pay | Admitting: *Deleted

## 2014-07-10 DIAGNOSIS — K8689 Other specified diseases of pancreas: Secondary | ICD-10-CM

## 2014-07-10 MED ORDER — LORAZEPAM 0.5 MG PO TABS: ORAL_TABLET | ORAL | Status: DC

## 2014-07-10 NOTE — Telephone Encounter (Signed)
Pt would like something call into [facility] for her nerves. Pt decline appt

## 2014-07-10 NOTE — Telephone Encounter (Signed)
Pt is wanting something to relax.

## 2014-07-10 NOTE — Telephone Encounter (Signed)
Pt informed and Rx called into pharmacy

## 2014-07-10 NOTE — Telephone Encounter (Signed)
Limited Lorazepam 0.5 mg every 6-8 hours prn severe anxiety #30 with no refills.

## 2014-07-10 NOTE — Telephone Encounter (Signed)
Barnett Applebaum called from the cancer center stating the pt is scheduled to see Dr Alen Blew on 07/13/2014 and she would like to know if the pt is aware of this appt as she did not see a note anywhere in EPIC stating the pt was informed of this nor did she see where the pt has had a biopsy and this was recommended by one of their physicians.  Barnett Applebaum wanted to know before she called the pt because most people are scared when they receive a call from them.  Please call her at ph#[phone].

## 2014-07-11 ENCOUNTER — Telehealth: Payer: Self-pay | Admitting: *Deleted

## 2014-07-11 ENCOUNTER — Other Ambulatory Visit: Payer: Self-pay | Admitting: Family Medicine

## 2014-07-11 ENCOUNTER — Other Ambulatory Visit: Payer: Self-pay | Admitting: Gastroenterology

## 2014-07-11 ENCOUNTER — Encounter (HOSPITAL_COMMUNITY): Payer: Self-pay | Admitting: *Deleted

## 2014-07-11 DIAGNOSIS — R1013 Epigastric pain: Secondary | ICD-10-CM

## 2014-07-11 NOTE — Telephone Encounter (Signed)
Pt has been notified of results.  See notes under imaging study (MRI) on 01-09-14 at 2:09 PM.  I was out of town last week but did notify pt by phone.  I have put in referrals for oncology and general surgery.  Pt is aware of all of this. I will also try to get interventional radiology to see to see if tissue bx can be obtained.

## 2014-07-11 NOTE — Addendum Note (Signed)
Addended by: Arta Silence on: 07/11/2014 04:25 PM   Modules accepted: Orders

## 2014-07-11 NOTE — Telephone Encounter (Signed)
Spoke with Dr. Elease Hashimoto re: patient appointment with Dr. Benay Spice on 07/17/14.  Per Dr. Benay Spice, patient needs to have EUS biopsy and staging.  Dr. Elease Hashimoto aware and in agreement.  Dr. Paulita Fujita to do EUS with biopsy on 07/12/14.  Patient to see Dr. Benay Spice on 07/17/14.  Spoke with patient and she is aware of above.  She expressed appreciation for the call and was without questions.  Contact names, numbers, and directions were provided.

## 2014-07-12 ENCOUNTER — Encounter (HOSPITAL_COMMUNITY)
Admission: RE | Disposition: A | Discharge status: Home / Self Care | Payer: Self-pay | Source: Ambulatory Visit | Attending: Gastroenterology

## 2014-07-12 ENCOUNTER — Encounter (HOSPITAL_COMMUNITY): Payer: Self-pay | Admitting: *Deleted

## 2014-07-12 ENCOUNTER — Encounter (HOSPITAL_COMMUNITY): Payer: BC Managed Care – PPO | Admitting: Anesthesiology

## 2014-07-12 ENCOUNTER — Ambulatory Visit (HOSPITAL_COMMUNITY)
Admission: RE | Admit: 2014-07-12 | Discharge: 2014-07-12 | Disposition: A | Discharge status: Home / Self Care | Payer: BC Managed Care – PPO | Source: Ambulatory Visit | Attending: Gastroenterology | Admitting: Gastroenterology

## 2014-07-12 ENCOUNTER — Ambulatory Visit (HOSPITAL_COMMUNITY): Payer: BC Managed Care – PPO | Admitting: Anesthesiology

## 2014-07-12 DIAGNOSIS — C259 Malignant neoplasm of pancreas, unspecified: Secondary | ICD-10-CM | POA: Diagnosis not present

## 2014-07-12 DIAGNOSIS — F3289 Other specified depressive episodes: Secondary | ICD-10-CM | POA: Insufficient documentation

## 2014-07-12 DIAGNOSIS — M129 Arthropathy, unspecified: Secondary | ICD-10-CM | POA: Diagnosis not present

## 2014-07-12 DIAGNOSIS — F329 Major depressive disorder, single episode, unspecified: Secondary | ICD-10-CM | POA: Diagnosis not present

## 2014-07-12 DIAGNOSIS — Z87891 Personal history of nicotine dependence: Secondary | ICD-10-CM | POA: Diagnosis not present

## 2014-07-12 DIAGNOSIS — J45909 Unspecified asthma, uncomplicated: Secondary | ICD-10-CM | POA: Diagnosis not present

## 2014-07-12 DIAGNOSIS — Z800 Family history of malignant neoplasm of digestive organs: Secondary | ICD-10-CM | POA: Diagnosis not present

## 2014-07-12 DIAGNOSIS — E785 Hyperlipidemia, unspecified: Secondary | ICD-10-CM | POA: Insufficient documentation

## 2014-07-12 DIAGNOSIS — K863 Pseudocyst of pancreas: Secondary | ICD-10-CM | POA: Diagnosis present

## 2014-07-12 DIAGNOSIS — I10 Essential (primary) hypertension: Secondary | ICD-10-CM | POA: Diagnosis not present

## 2014-07-12 DIAGNOSIS — Z860100 Personal history of colon polyps, unspecified: Secondary | ICD-10-CM | POA: Insufficient documentation

## 2014-07-12 DIAGNOSIS — K862 Cyst of pancreas: Secondary | ICD-10-CM | POA: Diagnosis present

## 2014-07-12 HISTORY — PX: FINE NEEDLE ASPIRATION: SHX5430

## 2014-07-12 HISTORY — DX: Malignant neoplasm of pancreas, unspecified: C25.9

## 2014-07-12 HISTORY — PX: EUS: SHX5427

## 2014-07-12 HISTORY — DX: Zoster without complications: B02.9

## 2014-07-12 SURGERY — ESOPHAGEAL ENDOSCOPIC ULTRASOUND (EUS) RADIAL
Anesthesia: Monitor Anesthesia Care

## 2014-07-12 MED ORDER — PROPOFOL INFUSION 10 MG/ML OPTIME
INTRAVENOUS | Status: DC | PRN
  Administered 2014-07-12: 140 ug/kg/min via INTRAVENOUS

## 2014-07-12 MED ORDER — GLYCOPYRROLATE 0.2 MG/ML IJ SOLN
INTRAMUSCULAR | Status: DC | PRN
  Administered 2014-07-12: 0.2 mg via INTRAVENOUS

## 2014-07-12 MED ORDER — MIDAZOLAM HCL 2 MG/2ML IJ SOLN
INTRAMUSCULAR | Status: AC
  Filled 2014-07-12: qty 2

## 2014-07-12 MED ORDER — LACTATED RINGERS IV SOLN
INTRAVENOUS | Status: DC
  Administered 2014-07-12: 1000 mL via INTRAVENOUS

## 2014-07-12 MED ORDER — LIDOCAINE HCL (CARDIAC) 20 MG/ML IV SOLN
INTRAVENOUS | Status: AC
  Filled 2014-07-12: qty 5

## 2014-07-12 MED ORDER — PROPOFOL 10 MG/ML IV BOLUS
INTRAVENOUS | Status: AC
  Filled 2014-07-12: qty 20

## 2014-07-12 MED ORDER — SODIUM CHLORIDE 0.9 % IV SOLN: INTRAVENOUS | Status: DC

## 2014-07-12 MED ORDER — MIDAZOLAM HCL 5 MG/5ML IJ SOLN
INTRAMUSCULAR | Status: DC | PRN
  Administered 2014-07-12: 2 mg via INTRAVENOUS

## 2014-07-12 MED ORDER — LACTATED RINGERS IV SOLN
INTRAVENOUS | Status: DC
  Administered 2014-07-12: 08:00:00 via INTRAVENOUS

## 2014-07-12 MED ORDER — BUTAMBEN-TETRACAINE-BENZOCAINE 2-2-14 % EX AERO
INHALATION_SPRAY | CUTANEOUS | Status: DC | PRN
  Administered 2014-07-12: 2 via TOPICAL

## 2014-07-12 MED ORDER — GLYCOPYRROLATE 0.2 MG/ML IJ SOLN
INTRAMUSCULAR | Status: AC
  Filled 2014-07-12: qty 1

## 2014-07-12 MED ORDER — FENTANYL CITRATE 0.05 MG/ML IJ SOLN: 25.0000 ug | INTRAMUSCULAR | Status: DC | PRN

## 2014-07-12 MED ORDER — LIDOCAINE HCL (CARDIAC) 20 MG/ML IV SOLN
INTRAVENOUS | Status: DC | PRN
  Administered 2014-07-12: 100 mg via INTRAVENOUS

## 2014-07-12 NOTE — Anesthesia Postprocedure Evaluation (Signed)
  Anesthesia Post-op Note  Patient: Sonya Larson  Procedure(s) Performed: Procedure(s) (LRB): ESOPHAGEAL ENDOSCOPIC ULTRASOUND (EUS) RADIAL (N/A) FINE NEEDLE ASPIRATION (FNA) RADIAL (N/A)  Patient Location: PACU  Anesthesia Type: MAC  Level of Consciousness: awake and alert   Airway and Oxygen Therapy: Patient Spontanous Breathing  Post-op Pain: mild  Post-op Assessment: Post-op Vital signs reviewed, Patient's Cardiovascular Status Stable, Respiratory Function Stable, Patent Airway and No signs of Nausea or vomiting  Last Vitals:  Filed Vitals:   07/12/14 1010  BP: 151/64  Pulse: 65  Temp:   Resp: 12    Post-op Vital Signs: stable   Complications: No apparent anesthesia complications

## 2014-07-12 NOTE — Transfer of Care (Signed)
Immediate Anesthesia Transfer of Care Note  Patient: Sonya Larson  Procedure(s) Performed: Procedure(s): ESOPHAGEAL ENDOSCOPIC ULTRASOUND (EUS) RADIAL (N/A) FINE NEEDLE ASPIRATION (FNA) RADIAL (N/A)  Patient Location: PACU  Anesthesia Type:MAC  Level of Consciousness: awake and alert   Airway & Oxygen Therapy: Patient Spontanous Breathing and Patient connected to nasal cannula oxygen  Post-op Assessment: Report given to PACU RN and Post -op Vital signs reviewed and stable  Post vital signs: Reviewed  Complications: No apparent anesthesia complications

## 2014-07-12 NOTE — Anesthesia Preprocedure Evaluation (Addendum)
Anesthesia Evaluation  Patient identified by MRN, date of birth, ID band Patient awake    Reviewed: Allergy & Precautions, H&P , NPO status , Patient's Chart, lab work & pertinent test results  Airway Mallampati: II TM Distance: >3 FB Neck ROM: full    Dental  (+) Caps, Dental Advisory Given Front upper 4 teeth all capped:   Pulmonary asthma , former smoker,  breath sounds clear to auscultation  Pulmonary exam normal       Cardiovascular Exercise Tolerance: Good hypertension, Pt. on medications Rhythm:regular Rate:Normal     Neuro/Psych negative neurological ROS  negative psych ROS   GI/Hepatic negative GI ROS, Neg liver ROS,   Endo/Other  negative endocrine ROS  Renal/GU negative Renal ROS  negative genitourinary   Musculoskeletal   Abdominal   Peds  Hematology negative hematology ROS (+)   Anesthesia Other Findings   Reproductive/Obstetrics negative OB ROS                          Anesthesia Physical Anesthesia Plan  ASA: III  Anesthesia Plan: MAC   Post-op Pain Management:    Induction:   Airway Management Planned:   Additional Equipment:   Intra-op Plan:   Post-operative Plan:   Informed Consent: I have reviewed the patients History and Physical, chart, labs and discussed the procedure including the risks, benefits and alternatives for the proposed anesthesia with the patient or authorized representative who has indicated his/her understanding and acceptance.   Dental Advisory Given  Plan Discussed with: CRNA and Surgeon  Anesthesia Plan Comments:         Anesthesia Quick Evaluation

## 2014-07-12 NOTE — Op Note (Signed)
[facility] [place], [id number]   ENDOSCOPIC ULTRASOUND PROCEDURE REPORT  PATIENT: Sonya Larson, Sonya Larson  MR#: [id number] BIRTHDATE: 03-11-1951  GENDER: Female ENDOSCOPIST: Arta Silence, MD REFERRED BY:  Arturo Morton, M.D.  Herbert Pun, M.D. PROCEDURE DATE:  07/12/2014 PROCEDURE:   Upper EUS w/FNA ASA CLASS:      Class II INDICATIONS:   1.  pancreatic mass. MEDICATIONS: MAC sedation, administered by CRNA  DESCRIPTION OF PROCEDURE:   After the risks benefits and alternatives of the procedure were  explained, informed consent was obtained. The patient was then placed in the left, lateral, decubitus postion and IV sedation was administered. Throughout the procedure, the patients blood pressure, pulse and oxygen saturations were monitored continuously.  Under direct visualization, the EUS scope 0464  endoscope was introduced through the mouth  and advanced to the second portion of the duodenum . Water was used as necessary to provide an acoustic interface.  Upon completion of the imaging, water was removed and the patient was sent to the recovery room in satisfactory condition.    FINDINGS:      Ill-defined hypoechoic approximately 2.5 x 3.0 cm mass along inferior margin of uncinate process.  Neighboring adenopathy seen.  No involvement of lesion with portal vasculature, SMV, SMA, celiac artery.  No biliary or pancreatic ductal dilatation.  Mass was biopsied x 3 with 25g FNA needle; preliminary cytology, reviewed in my presence by Dr. Tresa Moore, worrisome for adenocarcinoma.  IMPRESSION:     Uncinate pancreatic mass.  Assuming this is an adenocarcinoma, locoregional staging would be T3 N1 Mx by EUS.  RECOMMENDATIONS:     1.  Watch for potential complications of procedure. 2.  Await final cytology results. 3.  Will discuss results with Dr. Benay Spice.  There is no obvious involvement of lesion with neighboring vasculature nor  distant metastases on either EUS or MRI; accordingly, would consider surgical consultation for consideration of Whipple's procedure.    _______________________________ eSignedArta Silence, MD 07/12/2014 9:36 AM   CC:

## 2014-07-12 NOTE — Discharge Instructions (Signed)
Esophagogastroduodenoscopy °Care After °Refer to this sheet in the next few weeks. These instructions provide you with information on caring for yourself after your procedure. Your caregiver may also give you more specific instructions. Your treatment has been planned according to current medical practices, but problems sometimes occur. Call your caregiver if you have any problems or questions after your procedure.  °HOME CARE INSTRUCTIONS °· Do not eat or drink anything until the numbing medicine (local anesthetic) has worn off and your gag reflex has returned. You will know that the local anesthetic has worn off when you can swallow comfortably. °· Do not drive for 12 hours after the procedure or as directed by your caregiver. °· Only take medicines as directed by your caregiver. °SEEK MEDICAL CARE IF:  °· You cannot stop coughing. °· You are not urinating at all or less than usual. °SEEK IMMEDIATE MEDICAL CARE IF: °· You have difficulty swallowing. °· You cannot eat or drink. °· You have worsening throat or chest pain. °· You have dizziness, lightheadedness, or you faint. °· You have nausea or vomiting. °· You have chills. °· You have a fever. °· You have severe abdominal pain. °· You have black, tarry, or bloody stools. °Document Released: 11/03/2012 Document Reviewed: 11/03/2012 °Exit[employer]® Patient Information ©2015 [employer]. This information is not intended to replace advice given to you by your health care provider. Make sure you discuss any questions you have with your health care provider. ° °

## 2014-07-12 NOTE — H&P (Signed)
Patient interval history reviewed.  Patient examined again.  There has been no change from documented H/P dated 07/11/2014 (scanned into chart from our office) except as documented above.  Assessment:  1.  Abdominal pain. 2.  Unintentional weight loss. 3.  Pancreatic mass, as seen on MRI scan, without evidence of biliary obstruction.  Plan:  1.  Endoscopic ultrasound with anticipated fine needle aspiration biopsies. 2.  Risks (bleeding, infection, bowel perforation that could require surgery, sedation-related changes in cardiopulmonary systems), benefits (identification and possible treatment of source of symptoms, exclusion of certain causes of symptoms), and alternatives (watchful waiting, radiographic imaging studies, empiric medical treatment) of upper endoscopy with ultrasound and biopsies (EUS +/- FNA) were explained to patient/family in detail and patient wishes to proceed.

## 2014-07-13 ENCOUNTER — Other Ambulatory Visit: Payer: BC Managed Care – PPO

## 2014-07-13 ENCOUNTER — Ambulatory Visit: Payer: BC Managed Care – PPO

## 2014-07-13 ENCOUNTER — Encounter (HOSPITAL_COMMUNITY): Payer: Self-pay | Admitting: Gastroenterology

## 2014-07-13 ENCOUNTER — Ambulatory Visit: Payer: BC Managed Care – PPO | Admitting: Oncology

## 2014-07-17 ENCOUNTER — Telehealth: Payer: Self-pay | Admitting: Oncology

## 2014-07-17 ENCOUNTER — Ambulatory Visit (HOSPITAL_BASED_OUTPATIENT_CLINIC_OR_DEPARTMENT_OTHER): Payer: BC Managed Care – PPO | Admitting: Oncology

## 2014-07-17 ENCOUNTER — Other Ambulatory Visit: Payer: Self-pay | Admitting: *Deleted

## 2014-07-17 ENCOUNTER — Ambulatory Visit: Payer: BC Managed Care – PPO

## 2014-07-17 ENCOUNTER — Encounter: Payer: Self-pay | Admitting: Oncology

## 2014-07-17 ENCOUNTER — Ambulatory Visit (HOSPITAL_BASED_OUTPATIENT_CLINIC_OR_DEPARTMENT_OTHER): Payer: BC Managed Care – PPO

## 2014-07-17 VITALS — BP 163/88 | HR 60 | Temp 98.2°F | Resp 19 | Ht 63.0 in | Wt 194.0 lb

## 2014-07-17 DIAGNOSIS — C259 Malignant neoplasm of pancreas, unspecified: Secondary | ICD-10-CM

## 2014-07-17 DIAGNOSIS — C250 Malignant neoplasm of head of pancreas: Secondary | ICD-10-CM

## 2014-07-17 DIAGNOSIS — C251 Malignant neoplasm of body of pancreas: Secondary | ICD-10-CM

## 2014-07-17 DIAGNOSIS — R109 Unspecified abdominal pain: Secondary | ICD-10-CM

## 2014-07-17 DIAGNOSIS — C779 Secondary and unspecified malignant neoplasm of lymph node, unspecified: Secondary | ICD-10-CM

## 2014-07-17 DIAGNOSIS — E785 Hyperlipidemia, unspecified: Secondary | ICD-10-CM

## 2014-07-17 NOTE — Progress Notes (Signed)
Met with Sonya Larson and family. Explained role of nurse navigator. Educational information provided on pancreas cancer  Referral made to dietician for weight loss and diet education. [place] resources provided to patient, including SW service and support group information.  Contact names and phone numbers were provided for entire [facility] team.  Teach back method was used.  Patient staging work-up still in progress.   Will continue to follow as needed.

## 2014-07-17 NOTE — Progress Notes (Signed)
Checked in new pt with no financial concerns. °

## 2014-07-17 NOTE — Telephone Encounter (Signed)
gv and printed appt sched and avs for pt for Aug....sent pt to lab

## 2014-07-17 NOTE — Progress Notes (Signed)
[facility] New Patient Consult   Referring MD: Eulas Post, Md [place], [place] [address]   Sonya Larson 63 y.o.  January 15, 1951    Reason for Referral: Pancreas cancer   HPI: She reports abdominal pain and nausea beginning in June of this year. She had "shingles "at the right upper chest and back in June and initially related her symptoms to having shingles. She saw Dr. Elease Hashimoto when the pain persisted.  An abdominal ultrasound 06/23/2014 revealed no focal liver lesion. No gallbladder wall thickening or gallstones. A hypoechoic mass was noted in the pancreas head with no pancreatic ductal dilatation. She was referred for an MRI of the abdomen 07/04/2014. Diffuse pancreatic ductal dilatation was noted. A solid mass with central necrosis was seen in the pancreatic uncinate process measuring 2.4 x 4 cm. The mass does not involve the celiac axis, superior mesenteric artery, superior mesenteric vein, or portal vein. 11 mm lymph node was seen in the portacaval space suspicious for metastatic disease. No other lymphadenopathy. No liver masses.  Sonya Larson was referred to Dr. Paulita Fujita and was taken to an endoscopic ultrasound on 07/12/2014. A hypoechoic mass was noted in the uncinate process with may bring adenopathy. No involvement of the portal vasculature, superior mesenteric vein, superior mesenteric artery, or celiac artery. No biliary or pancreatic ductal dilatation. The mass was biopsied. The mass was staged as a T3 N1 lesion.  The cytology ([id number]) revealed adenocarcinoma.  Past Medical History  Diagnosis Date  . Asthma   . Arthritis   . Chicken pox   . Depression   . Allergy   . Hypertension   . Hyperlipidemia   . Polyp of colon   . Shingles JUNE 2015    RIGHT  SHOULDER    .   G2 P2  Past Surgical History  Procedure Laterality Date  . Breast reduction surgery Bilateral  1980   . Colonscopy   LAST DONE SEPT 2014    X 3  .  Hysteroscopy  2005  . Eus N/A 07/12/2014    Procedure: ESOPHAGEAL ENDOSCOPIC ULTRASOUND (EUS) RADIAL;  Surgeon: Arta Silence, MD;  Location: [facility];  Service: Endoscopy;  Laterality: N/A;  . Fine needle aspiration N/A 07/12/2014    Procedure: FINE NEEDLE ASPIRATION (FNA) RADIAL;  Surgeon: Arta Silence, MD;  Location: [facility];  Service: Endoscopy;  Laterality: N/A;    Medications: Reviewed  Allergies: No Known Allergies  Larson history: One sister. Her father had a brain tumor. Her maternal grandmother died of pancreas cancer at age 42.  Social History:   She is retired from an office occupation. She lives in [place]. She does not use tobacco. She drinks wine on rare occasion. No transfusion history. No risk factor for HIV or hepatitis.  History  Alcohol Use  . 0.5 oz/week  . 1 drink(s) per week    Comment: VERY RARE    History  Smoking status  . Former Smoker -- 0.50 packs/day for 5 years  . Quit date: 12/01/1978  Smokeless tobacco  . Never Used      ROS:   Positives include: Vaginal spotting for one day summer 2015-evaluated by gynecology, anorexia, 15 pound weight loss, nausea when she is anxious, left subcostal discomfort relieved with 600 mg of ibuprofen twice daily  A complete ROS was otherwise negative.  Physical Exam:  Blood pressure 163/88, pulse 60, temperature 98.2 F (36.8 C), temperature source Oral, resp. rate 19, height 5\' 3"  (1.6  m), weight 194 lb (87.998 kg), last menstrual period 10/31/2005, SpO2 99.00%.  HEENT: Oropharynx without visible mass, neck without mass Lungs: Clear bilaterally Cardiac: Regular rate and rhythm Abdomen: No hepatomegaly, nontender, no mass, no spinal megaly  Vascular: No leg edema Lymph nodes: No cervical, supraclavicular, axillary, or inguinal nodes Neurologic: Alert and oriented, the motor exam appears intact in the upper and lower extremities Skin: Resolved zoster rash at the right upper anterior  chest Musculoskeletal: No spine tenderness   LAB:  CBC  Lab Results  Component Value Date   WBC 6.8 06/19/2014   HGB 13.5 06/19/2014   HCT 40.1 06/19/2014   MCV 87.6 06/19/2014   PLT 305.0 06/19/2014   NEUTROABS 4.2 06/19/2014     CMP      Component Value Date/Time   NA 140 06/19/2014 1409   K 4.0 06/19/2014 1409   CL 103 06/19/2014 1409   CO2 31 06/19/2014 1409   GLUCOSE 93 06/19/2014 1409   BUN 17 06/19/2014 1409   CREATININE 0.9 06/19/2014 1409   CALCIUM 9.9 06/19/2014 1409   PROT 7.4 06/19/2014 1409   ALBUMIN 4.3 06/19/2014 1409   AST 33 06/19/2014 1409   ALT 35 06/19/2014 1409   ALKPHOS 75 06/19/2014 1409   BILITOT 0.6 06/19/2014 1409   CA 19-9 pending Imaging:  I reviewed the abdomen MRI from 07/04/2014 with Sonya Larson. There is a pancreas mass and a portacaval lymph node   Assessment/Plan:   1. Adenocarcinoma the pancreas, uncinate mass, EUS April 2015 revealed a uT3,uN1 lesion with an FNA biopsy confirming adenocarcinoma  MRI abdomen 07/04/2014 confirmed a pancreas uncinate 8 mass, 11 mm portacaval lymph node, no vascular involvement, and no evidence of distant metastatic disease 2. Hypertension  3.   Hyperlipidemia  4.   vaginal spotting summer 2015-evaluated by gynecology  5.   zoster rash at June 2015  6.   Asthma  7.   Larson history of pancreas cancer   Disposition:   Sonya Larson has been diagnosed with pancreas cancer. I discussed the staging evaluation and treatment options with Sonya Larson. She appears to be a resection candidate based on the current clinical staging. She understands surgery is the only potentially curative therapy in patients with adenocarcinoma of the pancreas.  She is scheduled to see Dr. Barry Dienes later this week.  We discussed adjuvant and neoadjuvant options pending Dr. Marlowe Aschoff evaluation and a staging PET scan.  She will use Ativan and Compazine as needed for nausea. Sonya Larson if  ibuprofen does not relieve her pain.  She will be scheduled for a staging PET scan within the next few days. Her case will be presented at the GI tumor conference and she will return for a followup visit 07/26/2014.  [place], [place] 07/17/2014, 6:19 PM

## 2014-07-18 ENCOUNTER — Other Ambulatory Visit: Payer: Self-pay | Admitting: *Deleted

## 2014-07-18 LAB — CANCER ANTIGEN 19-9: CA 19 9: 7872.6 U/mL — AB (ref <35.0)

## 2014-07-18 MED ORDER — PROCHLORPERAZINE MALEATE 5 MG PO TABS: 5.0000 mg | ORAL_TABLET | Freq: Four times a day (QID) | ORAL | Status: DC | PRN

## 2014-07-20 ENCOUNTER — Ambulatory Visit (HOSPITAL_COMMUNITY)
Admission: RE | Admit: 2014-07-20 | Discharge: 2014-07-20 | Disposition: A | Discharge status: Home / Self Care | Payer: BC Managed Care – PPO | Source: Ambulatory Visit | Attending: Oncology | Admitting: Oncology

## 2014-07-20 DIAGNOSIS — C259 Malignant neoplasm of pancreas, unspecified: Secondary | ICD-10-CM | POA: Insufficient documentation

## 2014-07-20 DIAGNOSIS — K7689 Other specified diseases of liver: Secondary | ICD-10-CM | POA: Diagnosis not present

## 2014-07-20 LAB — GLUCOSE, CAPILLARY: Glucose-Capillary: 104 mg/dL — ABNORMAL HIGH (ref 70–99)

## 2014-07-20 MED ORDER — FLUDEOXYGLUCOSE F - 18 (FDG) INJECTION
11.1000 | Freq: Once | INTRAVENOUS | Status: AC | PRN
  Administered 2014-07-20: 11.1 via INTRAVENOUS

## 2014-07-21 ENCOUNTER — Encounter (HOSPITAL_COMMUNITY): Payer: Self-pay | Admitting: *Deleted

## 2014-07-21 ENCOUNTER — Encounter (HOSPITAL_COMMUNITY): Payer: Self-pay | Admitting: Pharmacy Technician

## 2014-07-21 ENCOUNTER — Ambulatory Visit (INDEPENDENT_AMBULATORY_CARE_PROVIDER_SITE_OTHER): Payer: BC Managed Care – PPO | Admitting: General Surgery

## 2014-07-21 ENCOUNTER — Encounter (INDEPENDENT_AMBULATORY_CARE_PROVIDER_SITE_OTHER): Payer: Self-pay | Admitting: General Surgery

## 2014-07-21 VITALS — BP 142/90 | HR 64 | Temp 98.3°F | Resp 14 | Ht 63.0 in | Wt 192.8 lb

## 2014-07-21 DIAGNOSIS — C250 Malignant neoplasm of head of pancreas: Secondary | ICD-10-CM

## 2014-07-21 NOTE — Progress Notes (Signed)
I instructed patient to stop taking Ibuprofen, any Aspirin products and herbal medications.

## 2014-07-21 NOTE — Patient Instructions (Signed)
We will schedule diagnostic laparoscopy (looking for spread of disease) in the abdomen  If we find spread of disease, we will place a port a cath.    Then you can get chemoradiation or chemo set up with Dr. Benay Spice.

## 2014-07-22 DIAGNOSIS — C250 Malignant neoplasm of head of pancreas: Secondary | ICD-10-CM | POA: Insufficient documentation

## 2014-07-22 NOTE — Progress Notes (Signed)
Chief complaint:  New pancreatic cancer  Referring MD: Dr. Benay Spice  HISTORY: Pt is a 1 F who is here in consultation at the request of Dr. Benay Spice for a new pancreatic cancer.  She had shingles in June, but continued to have nausea and left sided abdominal pain even after lesions cleared up.  She underwent an ultrasound to look at her gallbladder and was found to have a mass in the head of her pancreas.  MRI demonstrated a 4 cm mass with necrosis in the uncinate process.  She also had an enlarged portacaval lymph node.  PET scan was positive in the pancreatic uncinate and in that lymph node.  EUS biopsy was positive for adenocarcinoma.  She has had some weight loss, but not a significant amount until the last 1-2 months.    She denies diarrhea, jaundice, vomiting.  She has had early satiety and decreased appetite.  She is not having any other rashes, and her shingles is better.  She continues to have some low grade left upper quadrant pain.    Past Medical History  Diagnosis Date  . Chicken pox   . Depression   . Allergy   . Hypertension   . Hyperlipidemia   . Polyp of colon   . Shingles JUNE 2015    RIGHT  SHOULDER  . Asthma     exercise induced  . Anxiety     since diagnosis  . Arthritis     mild    Past Surgical History  Procedure Laterality Date  . Breast reduction surgery Bilateral   . Colonscopy   LAST DONE SEPT 2014    X 3  . Hysteroscopy  2005  . Eus N/A 07/12/2014    Procedure: ESOPHAGEAL ENDOSCOPIC ULTRASOUND (EUS) RADIAL;  Surgeon: Arta Silence, MD;  Location: [facility];  Service: Endoscopy;  Laterality: N/A;  . Fine needle aspiration N/A 07/12/2014    Procedure: FINE NEEDLE ASPIRATION (FNA) RADIAL;  Surgeon: Arta Silence, MD;  Location: [facility];  Service: Endoscopy;  Laterality: N/A;    Current Outpatient Prescriptions  Medication Sig Dispense Refill  . atorvastatin (LIPITOR) 10 MG tablet Take 1 tablet (10 mg total) by mouth daily.  90 tablet  1  .  budesonide-formoterol (SYMBICORT) 160-4.5 MCG/ACT inhaler Inhale 1 puff into the lungs at bedtime.       . cetirizine (ZYRTEC) 10 MG tablet Take 10 mg by mouth daily.      . hydrochlorothiazide (HYDRODIURIL) 25 MG tablet Take 1 tablet (25 mg total) by mouth daily.  90 tablet  1  . lisinopril (PRINIVIL,ZESTRIL) 40 MG tablet Take 1 tablet (40 mg total) by mouth daily.  90 tablet  1  . Multiple Vitamins-Minerals ([employer] SPECTRAVITE ADULT 50+) TABS Take 1 tablet by mouth daily.       . Probiotic Product (ALIGN) 4 MG CAPS Take 4 mg by mouth daily.       Marland Kitchen ibuprofen (ADVIL,MOTRIN) 200 MG tablet Take 600 mg by mouth every 8 (eight) hours as needed (pain).      . LORazepam (ATIVAN) 0.5 MG tablet Take 0.5 mg by mouth every 6 (six) hours as needed for anxiety.       No current facility-administered medications for this visit.     No Known Allergies   Family History  Problem Relation Age of Onset  . Arthritis Mother   . Hypertension Mother   . Alcohol abuse Father   . Cancer Father     brain tumor  Pancreas cancer - grandmother 2 first cousins breast cancer   History   Social History  . Marital Status: Married    Spouse Name: N/A    Number of Children: N/A  . Years of Education: N/A   Social History Main Topics  . Smoking status: Former Smoker -- 0.50 packs/day for 5 years    Quit date: 12/01/1978  . Smokeless tobacco: Never Used  . Alcohol Use: Yes     Comment: VERY RARE  . Drug Use: No  . Sexual Activity: None   Other Topics Concern  . None   Social History Narrative  . None     REVIEW OF SYSTEMS - PERTINENT POSITIVES ONLY: 12 point review of systems negative other than HPI and PMH except for unexpected weight loss  EXAM: Filed Vitals:   07/21/14 0957  BP: 142/90  Pulse: 64  Temp: 98.3 F (36.8 C)  Resp: 14    Wt Readings from Last 3 Encounters:  07/21/14 192 lb 12.8 oz (87.454 kg)  07/17/14 194 lb (87.998 kg)  07/12/14 195 lb (88.451 kg)     Gen:  No acute  distress.  Well nourished and well groomed.   Neurological: Alert and oriented to person, place, and time. Coordination normal.  Head: Normocephalic and atraumatic.  Eyes: Conjunctivae are normal. Pupils are equal, round, and reactive to light. No scleral icterus.  Neck: Normal range of motion. Neck supple. No tracheal deviation or thyromegaly present.  Cardiovascular: Normal rate, regular rhythm, normal heart sounds and intact distal pulses.  Exam reveals no gallop and no friction rub.  No murmur heard. Respiratory: Effort normal.  No respiratory distress. No chest wall tenderness. Breath sounds normal.  No wheezes, rales or rhonchi.  GI: Soft. Bowel sounds are normal. The abdomen is soft and nontender.  There is no rebound and no guarding.  Musculoskeletal: Normal range of motion. Extremities are nontender.  Lymphadenopathy: No cervical, preauricular, postauricular or axillary adenopathy is present Skin: Skin is warm and dry. No rash noted. No diaphoresis. No erythema. No pallor. No clubbing, cyanosis, or edema.   Psychiatric:Very anxious, tremulous. Behavior is normal. Judgment and thought content normal.    LABORATORY RESULTS: Available labs are reviewed   Recent Results (from the past 2160 hour(s))  BASIC METABOLIC PANEL     Status: None   Collection Time    06/19/14  2:09 PM      Result Value Ref Range   Sodium 140  135 - 145 mEq/L   Potassium 4.0  3.5 - 5.1 mEq/L   Chloride 103  96 - 112 mEq/L   CO2 31  19 - 32 mEq/L   Glucose, Bld 93  70 - 99 mg/dL   BUN 17  6 - 23 mg/dL   Creatinine, Ser 0.9  0.4 - 1.2 mg/dL   Calcium 9.9  8.4 - 10.5 mg/dL   GFR 63.58  >60.00 mL/min  CBC WITH DIFFERENTIAL     Status: None   Collection Time    06/19/14  2:09 PM      Result Value Ref Range   WBC 6.8  4.0 - 10.5 K/uL   RBC 4.58  3.87 - 5.11 Mil/uL   Hemoglobin 13.5  12.0 - 15.0 g/dL   HCT 40.1  36.0 - 46.0 %   MCV 87.6  78.0 - 100.0 fl   MCHC 33.8  30.0 - 36.0 g/dL   RDW 14.2  11.5 -  15.5 %   Platelets 305.0  150.0 -  400.0 K/uL   Neutrophils Relative % 60.7  43.0 - 77.0 %   Lymphocytes Relative 27.1  12.0 - 46.0 %   Monocytes Relative 8.1  3.0 - 12.0 %   Eosinophils Relative 3.6  0.0 - 5.0 %   Basophils Relative 0.5  0.0 - 3.0 %   Neutro Abs 4.2  1.4 - 7.7 K/uL   Lymphs Abs 1.9  0.7 - 4.0 K/uL   Monocytes Absolute 0.6  0.1 - 1.0 K/uL   Eosinophils Absolute 0.2  0.0 - 0.7 K/uL   Basophils Absolute 0.0  0.0 - 0.1 K/uL  HEPATIC FUNCTION PANEL     Status: None   Collection Time    06/19/14  2:09 PM      Result Value Ref Range   Total Bilirubin 0.6  0.2 - 1.2 mg/dL   Bilirubin, Direct 0.1  0.0 - 0.3 mg/dL   Alkaline Phosphatase 75  39 - 117 U/L   AST 33  0 - 37 U/L   ALT 35  0 - 35 U/L   Total Protein 7.4  6.0 - 8.3 g/dL   Albumin 4.3  3.5 - 5.2 g/dL  LIPASE     Status: None   Collection Time    06/19/14  2:09 PM      Result Value Ref Range   Lipase 54.0  11.0 - 59.0 U/L  CANCER ANTIGEN 19-9     Status: Abnormal   Collection Time    07/17/14  3:41 PM      Result Value Ref Range   CA 19-9 7872.6 (*) <35.0 U/mL   Comment: Result repeated and verified.Result confirmed by automatic dilution.  GLUCOSE, CAPILLARY     Status: Abnormal   Collection Time    07/20/14 12:40 PM      Result Value Ref Range   Glucose-Capillary 104 (*) 70 - 99 mg/dL     RADIOLOGY RESULTS: See E-Chart or I-Site for most recent results.  Images and reports are reviewed.  Mr Abdomen W Wo Contrast  07/04/2014   CLINICAL DATA:  Epigastric abdominal pain and nausea. Pancreatic mass seen on ultrasound.  EXAM: MRI ABDOMEN WITHOUT AND WITH CONTRAST  TECHNIQUE: Multiplanar multisequence MR imaging of the abdomen was performed both before and after the administration of intravenous contrast.  CONTRAST:  45mL MULTIHANCE GADOBENATE DIMEGLUMINE 529 MG/ML IV SOLN  COMPARISON:  Abdominal ultrasound on 06/23/2014  FINDINGS: Diffuse pancreatic ductal dilatation is demonstrated. A solid mass with central  necrosis is seen in the pancreatic uncinate process which measures 2.4 x 4.0 cm on image 45 of series 14. This is consistent with pancreatic adenocarcinoma. This mass does not appear to involve the celiac axis, superior mesenteric artery or vein, or portal vein.  11 mm lymph node is seen in the portacaval space on image 31 of series 14, suspicious for metastatic disease. No other lymphadenopathy identified.  There is no evidence of biliary ductal dilatation. Hepatic steatosis is noted, however no liver masses are identified. The gallbladder, spleen, adrenal glands, and kidneys are normal in appearance. No evidence of hydronephrosis. No evidence of abdominal inflammatory process or abnormal fluid collections.  IMPRESSION: 4 cm solid mass with central necrosis in the pancreatic uncinate process, consistent with pancreatic adenocarcinoma.  11 mm portacaval lymph node, suspicious for metastatic disease.  No other metastatic disease identified within the abdomen. No evidence of biliary ductal dilatation.   Electronically Signed   By: Earle Gell M.D.   On: 07/04/2014 12:11  US Abdomen Complete  06/23/2014   CLINICAL DATA:  Epigastric pain radiating to the back with nausea and constipation and unintentional 10 lb weight loss over 6 weeks  EXAM: ULTRASOUND ABDOMEN COMPLETE  COMPARISON:  None.  FINDINGS: Gallbladder:  No gallstones or wall thickening visualized. No sonographic Murphy sign noted.  Common bile duct:  Diameter: 3.4 mm  Liver:  No focal lesion identified. The echotexture is heterogeneous and mildly increased. There is no ductal dilation.  IVC:  Partially obscured by bowel gas  Pancreas:  There is a hypoechoic mass in the pancreatic head measuring 3.2 x 2.1 x 3.6 cm. There is no pancreatic ductal dilation.  Spleen:  Size and appearance within normal limits.  Right Kidney:  Length: 11.2 cm. Echogenicity within normal limits. No mass or hydronephrosis visualized.  Left Kidney:  Length: 12.1 cm. Echogenicity  within normal limits. No mass or hydronephrosis visualized.  Abdominal aorta:  Bowel gas limits evaluation.  Other findings:  No ascites is demonstrated.  IMPRESSION: There is an abnormal hypodense mass measuring up to 3.6 cm in the pancreatic head that is worrisome for malignancy. A pancreatic protocol MRI or CT scan is recommended. No acute abnormality is demonstrated elsewhere.  These results will be called to the ordering clinician or representative by the Radiologist Assistant, and communication documented in the PACS or zVision Dashboard.   Electronically Signed   By: David  Martinique   On: 06/23/2014 11:29   Nm Pet Image Initial (pi) Skull Base To Thigh  07/20/2014   CLINICAL DATA:  Initial treatment strategy for pancreatic adenocarcinoma.  EXAM: NUCLEAR MEDICINE PET SKULL BASE TO THIGH  TECHNIQUE: 11.1 mCi F-18 FDG was injected intravenously. Full-ring PET imaging was performed from the skull base to thigh after the radiotracer. CT data was obtained and used for attenuation correction and anatomic localization.  FASTING BLOOD GLUCOSE:  Value: 104 mg/dl  COMPARISON:  MRI 07/04/2014  FINDINGS: NECK  No hypermetabolic lymph nodes in the neck.  CHEST  No hypermetabolic mediastinal or hilar nodes. No suspicious pulmonary nodules on the CT scan.  ABDOMEN/PELVIS  No hypermetabolic lesion in the uncinate process of pancreas with intense metabolic activity (SUV max 10.5). There is an adjacent intensely hypermetabolic lymph node at the periportal location (image 95 fused series) which was suspicious on comparison MRI and has intense metabolic activity also with SUV max 6.9.  There is no abnormal focal metabolic activity within the liver. There is low attenuation within the right hepatic lobe suggesting hepatic steatosis.  No hypermetabolic peritoneal or omental implants. No abnormal metabolic activity within pelvic lymph nodes.  SKELETON  No focal hypermetabolic activity to suggest skeletal metastasis.  IMPRESSION: 1.  Hypermetabolic mass in the uncinate process of the pancreas consists with primary pancreatic adenocarcinoma. 2. Single hypermetabolic nodal metastasis in periportal nodal station. 3. No evidence of liver metastasis or distant metastasis. 4. Hepatic steatosis in the right hepatic lobe.   Electronically Signed   By: Suzy Bouchard M.D.   On: 07/20/2014 15:12      ASSESSMENT AND PLAN: Adenocarcinoma of uncinate process of pancreas Patient appears to have probably resectable disease, but it is concerning that she has a very high CA 19-9 in association with the lack of jaundice.  She only appears to have the portal node metastatic, but is at high risk for having stage IV disease.    We will schedule her for a diagnostic laparoscopy in order to assess.  This would make a big difference in  the approach to her treatment.  If she had carcinomatosis, she would get more aggressive chemo, and would not get radiation.  If she appears to have localized disease, she would proceed with chemoradiation followed by plan of resection.  I discussed that she would be completely asleep, we would place 1-3 ports and look around for any small suspicious areas on her liver/diaphragm, peritoneal lining, or omentum.  We would send those for biopsy.  If she appears to have metastatic disease, we will place a port a cath for chemo.  We briefly reviewed pancreaticoduodenectomy.  I discussed the role for resection, the prognosis, the recovery and risks of complications.  She is very anxious. She has been given a script for ativan previously, but has not been taking it. I advised her to at least start taking it at night.   45 min spent in counseling with patient and family.       Milus Height MD Surgical Oncology, General and [facility]    Visit Diagnoses: 1. Adenocarcinoma of uncinate process of pancreas     Primary Care Physician: Eulas Post, MD  Other care  team [facility] [place]

## 2014-07-22 NOTE — Assessment & Plan Note (Signed)
Patient appears to have probably resectable disease, but it is concerning that she has a very high CA 19-9 in association with the lack of jaundice.  She only appears to have the portal node metastatic, but is at high risk for having stage IV disease.    We will schedule her for a diagnostic laparoscopy in order to assess.  This would make a big difference in the approach to her treatment.  If she had carcinomatosis, she would get more aggressive chemo, and would not get radiation.  If she appears to have localized disease, she would proceed with chemoradiation followed by plan of resection.  I discussed that she would be completely asleep, we would place 1-3 ports and look around for any small suspicious areas on her liver/diaphragm, peritoneal lining, or omentum.  We would send those for biopsy.  If she appears to have metastatic disease, we will place a port a cath for chemo.  We briefly reviewed pancreaticoduodenectomy.  I discussed the role for resection, the prognosis, the recovery and risks of complications.  She is very anxious. She has been given a script for ativan previously, but has not been taking it. I advised her to at least start taking it at night.   45 min spent in counseling with patient and family.

## 2014-07-23 MED ORDER — CEFAZOLIN SODIUM-DEXTROSE 2-3 GM-% IV SOLR
2.0000 g | INTRAVENOUS | Status: AC
  Administered 2014-07-24: 2 g via INTRAVENOUS
  Filled 2014-07-23: qty 50

## 2014-07-24 ENCOUNTER — Encounter (HOSPITAL_COMMUNITY)
Admission: RE | Disposition: A | Discharge status: Home / Self Care | Payer: Self-pay | Source: Ambulatory Visit | Attending: General Surgery

## 2014-07-24 ENCOUNTER — Encounter (HOSPITAL_COMMUNITY): Payer: Self-pay | Admitting: *Deleted

## 2014-07-24 ENCOUNTER — Ambulatory Visit (HOSPITAL_COMMUNITY): Payer: BC Managed Care – PPO

## 2014-07-24 ENCOUNTER — Ambulatory Visit (HOSPITAL_COMMUNITY): Payer: BC Managed Care – PPO | Admitting: Certified Registered Nurse Anesthetist

## 2014-07-24 ENCOUNTER — Ambulatory Visit (HOSPITAL_COMMUNITY)
Admission: RE | Admit: 2014-07-24 | Discharge: 2014-07-24 | Disposition: A | Discharge status: Home / Self Care | Payer: BC Managed Care – PPO | Source: Ambulatory Visit | Attending: General Surgery | Admitting: General Surgery

## 2014-07-24 ENCOUNTER — Encounter (HOSPITAL_COMMUNITY): Payer: BC Managed Care – PPO | Admitting: Certified Registered Nurse Anesthetist

## 2014-07-24 DIAGNOSIS — J45909 Unspecified asthma, uncomplicated: Secondary | ICD-10-CM | POA: Insufficient documentation

## 2014-07-24 DIAGNOSIS — Z79899 Other long term (current) drug therapy: Secondary | ICD-10-CM | POA: Diagnosis not present

## 2014-07-24 DIAGNOSIS — M129 Arthropathy, unspecified: Secondary | ICD-10-CM | POA: Diagnosis not present

## 2014-07-24 DIAGNOSIS — F411 Generalized anxiety disorder: Secondary | ICD-10-CM | POA: Insufficient documentation

## 2014-07-24 DIAGNOSIS — F329 Major depressive disorder, single episode, unspecified: Secondary | ICD-10-CM | POA: Diagnosis not present

## 2014-07-24 DIAGNOSIS — F3289 Other specified depressive episodes: Secondary | ICD-10-CM | POA: Diagnosis not present

## 2014-07-24 DIAGNOSIS — E785 Hyperlipidemia, unspecified: Secondary | ICD-10-CM | POA: Diagnosis not present

## 2014-07-24 DIAGNOSIS — I10 Essential (primary) hypertension: Secondary | ICD-10-CM | POA: Insufficient documentation

## 2014-07-24 DIAGNOSIS — C250 Malignant neoplasm of head of pancreas: Secondary | ICD-10-CM | POA: Insufficient documentation

## 2014-07-24 DIAGNOSIS — C259 Malignant neoplasm of pancreas, unspecified: Secondary | ICD-10-CM

## 2014-07-24 HISTORY — DX: Anxiety disorder, unspecified: F41.9

## 2014-07-24 HISTORY — PX: LAPAROSCOPY: SHX197

## 2014-07-24 LAB — BASIC METABOLIC PANEL
ANION GAP: 11 (ref 5–15)
BUN: 12 mg/dL (ref 6–23)
CHLORIDE: 102 meq/L (ref 96–112)
CO2: 26 meq/L (ref 19–32)
Calcium: 9.5 mg/dL (ref 8.4–10.5)
Creatinine, Ser: 0.82 mg/dL (ref 0.50–1.10)
GFR calc non Af Amer: 74 mL/min — ABNORMAL LOW (ref >90)
GFR, EST AFRICAN AMERICAN: 86 mL/min — AB (ref >90)
Glucose, Bld: 110 mg/dL — ABNORMAL HIGH (ref 70–99)
Potassium: 3.9 mEq/L (ref 3.7–5.3)
SODIUM: 139 meq/L (ref 137–147)

## 2014-07-24 LAB — CBC
HEMATOCRIT: 37.1 % (ref 36.0–46.0)
HEMOGLOBIN: 12.3 g/dL (ref 12.0–15.0)
MCH: 28.5 pg (ref 26.0–34.0)
MCHC: 33.2 g/dL (ref 30.0–36.0)
MCV: 85.9 fL (ref 78.0–100.0)
Platelets: 238 10*3/uL (ref 150–400)
RBC: 4.32 MIL/uL (ref 3.87–5.11)
RDW: 13.2 % (ref 11.5–15.5)
WBC: 4.2 10*3/uL (ref 4.0–10.5)

## 2014-07-24 SURGERY — LAPAROSCOPY, DIAGNOSTIC
Anesthesia: General | Site: Abdomen

## 2014-07-24 MED ORDER — ONDANSETRON HCL 4 MG/2ML IJ SOLN
INTRAMUSCULAR | Status: DC | PRN
  Administered 2014-07-24: 4 mg via INTRAVENOUS

## 2014-07-24 MED ORDER — ONDANSETRON HCL 4 MG/2ML IJ SOLN
4.0000 mg | Freq: Four times a day (QID) | INTRAMUSCULAR | Status: AC | PRN
  Administered 2014-07-24: 4 mg via INTRAVENOUS

## 2014-07-24 MED ORDER — LIDOCAINE HCL (CARDIAC) 20 MG/ML IV SOLN
INTRAVENOUS | Status: DC | PRN
Start: 2014-07-24 — End: 2014-07-24
  Administered 2014-07-24: 80 mg via INTRAVENOUS

## 2014-07-24 MED ORDER — EPHEDRINE SULFATE 50 MG/ML IJ SOLN
INTRAMUSCULAR | Status: DC | PRN
  Administered 2014-07-24 (×2): 5 mg via INTRAVENOUS

## 2014-07-24 MED ORDER — OXYCODONE-ACETAMINOPHEN 5-325 MG PO TABS: 1.0000 | ORAL_TABLET | ORAL | Status: DC | PRN

## 2014-07-24 MED ORDER — LIDOCAINE HCL (CARDIAC) 20 MG/ML IV SOLN
INTRAVENOUS | Status: AC
  Filled 2014-07-24: qty 5

## 2014-07-24 MED ORDER — LIDOCAINE HCL 1 % IJ SOLN
INTRAMUSCULAR | Status: DC | PRN
  Administered 2014-07-24: 08:00:00 via INTRAMUSCULAR

## 2014-07-24 MED ORDER — 0.9 % SODIUM CHLORIDE (POUR BTL) OPTIME
TOPICAL | Status: DC | PRN
  Administered 2014-07-24: 1000 mL

## 2014-07-24 MED ORDER — OXYCODONE HCL 5 MG PO TABS: 5.0000 mg | ORAL_TABLET | Freq: Once | ORAL | Status: DC | PRN

## 2014-07-24 MED ORDER — OXYCODONE HCL 5 MG/5ML PO SOLN: 5.0000 mg | Freq: Once | ORAL | Status: DC | PRN

## 2014-07-24 MED ORDER — MIDAZOLAM HCL 2 MG/2ML IJ SOLN
INTRAMUSCULAR | Status: AC
  Filled 2014-07-24: qty 2

## 2014-07-24 MED ORDER — FENTANYL CITRATE 0.05 MG/ML IJ SOLN
INTRAMUSCULAR | Status: AC
Start: 2014-07-24 — End: 2014-07-24
  Filled 2014-07-24: qty 5

## 2014-07-24 MED ORDER — FENTANYL CITRATE 0.05 MG/ML IJ SOLN
INTRAMUSCULAR | Status: DC | PRN
  Administered 2014-07-24 (×2): 50 ug via INTRAVENOUS

## 2014-07-24 MED ORDER — BUPIVACAINE-EPINEPHRINE (PF) 0.25% -1:200000 IJ SOLN
INTRAMUSCULAR | Status: AC
  Filled 2014-07-24: qty 30

## 2014-07-24 MED ORDER — ONDANSETRON HCL 4 MG/2ML IJ SOLN
INTRAMUSCULAR | Status: AC
  Filled 2014-07-24: qty 2

## 2014-07-24 MED ORDER — SODIUM CHLORIDE 0.9 % IR SOLN
Status: DC | PRN
  Administered 2014-07-24: 1000 mL

## 2014-07-24 MED ORDER — HYDROMORPHONE HCL PF 1 MG/ML IJ SOLN: 0.2500 mg | INTRAMUSCULAR | Status: DC | PRN

## 2014-07-24 MED ORDER — NEOSTIGMINE METHYLSULFATE 10 MG/10ML IV SOLN
INTRAVENOUS | Status: DC | PRN
  Administered 2014-07-24: 5 mg via INTRAVENOUS

## 2014-07-24 MED ORDER — ROCURONIUM BROMIDE 100 MG/10ML IV SOLN
INTRAVENOUS | Status: DC | PRN
  Administered 2014-07-24: 40 mg via INTRAVENOUS

## 2014-07-24 MED ORDER — PROPOFOL 10 MG/ML IV BOLUS
INTRAVENOUS | Status: AC
  Filled 2014-07-24: qty 20

## 2014-07-24 MED ORDER — MIDAZOLAM HCL 5 MG/5ML IJ SOLN
INTRAMUSCULAR | Status: DC | PRN
  Administered 2014-07-24: 2 mg via INTRAVENOUS

## 2014-07-24 MED ORDER — PROPOFOL 10 MG/ML IV BOLUS
INTRAVENOUS | Status: DC | PRN
  Administered 2014-07-24: 150 mg via INTRAVENOUS

## 2014-07-24 MED ORDER — LACTATED RINGERS IV SOLN
INTRAVENOUS | Status: DC | PRN
  Administered 2014-07-24: 07:00:00 via INTRAVENOUS

## 2014-07-24 MED ORDER — ROCURONIUM BROMIDE 50 MG/5ML IV SOLN
INTRAVENOUS | Status: AC
  Filled 2014-07-24: qty 1

## 2014-07-24 MED ORDER — GLYCOPYRROLATE 0.2 MG/ML IJ SOLN
INTRAMUSCULAR | Status: DC | PRN
  Administered 2014-07-24: .8 mg via INTRAVENOUS

## 2014-07-24 SURGICAL SUPPLY — 31 items
ADH SKN CLS APL DERMABOND .7 (GAUZE/BANDAGES/DRESSINGS) ×1
CANISTER SUCTION 2500CC (MISCELLANEOUS) ×3 IMPLANT
CHLORAPREP W/TINT 26ML (MISCELLANEOUS) ×3 IMPLANT
COVER SURGICAL LIGHT HANDLE (MISCELLANEOUS) ×3 IMPLANT
DERMABOND ADVANCED (GAUZE/BANDAGES/DRESSINGS) ×2
DERMABOND ADVANCED .7 DNX12 (GAUZE/BANDAGES/DRESSINGS) ×1 IMPLANT
DRAPE UTILITY 15X26 W/TAPE STR (DRAPE) ×6 IMPLANT
DRAPE WARM FLUID 44X44 (DRAPE) ×3 IMPLANT
ELECT REM PT RETURN 9FT ADLT (ELECTROSURGICAL) ×3
ELECTRODE REM PT RTRN 9FT ADLT (ELECTROSURGICAL) ×1 IMPLANT
GLOVE BIO SURGEON STRL SZ 6 (GLOVE) ×5 IMPLANT
GLOVE BIO SURGEON STRL SZ7.5 (GLOVE) ×2 IMPLANT
GLOVE BIOGEL PI IND STRL 6.5 (GLOVE) ×1 IMPLANT
GLOVE BIOGEL PI INDICATOR 6.5 (GLOVE) ×4
GLOVE SURG SS PI 6.5 STRL IVOR (GLOVE) ×2 IMPLANT
GOWN STRL REUS W/ TWL LRG LVL3 (GOWN DISPOSABLE) ×2 IMPLANT
GOWN STRL REUS W/TWL 2XL LVL3 (GOWN DISPOSABLE) ×6 IMPLANT
GOWN STRL REUS W/TWL LRG LVL3 (GOWN DISPOSABLE) ×6
KIT BASIN OR (CUSTOM PROCEDURE TRAY) ×3 IMPLANT
KIT ROOM TURNOVER OR (KITS) ×3 IMPLANT
NS IRRIG 1000ML POUR BTL (IV SOLUTION) ×3 IMPLANT
PAD ARMBOARD 7.5X6 YLW CONV (MISCELLANEOUS) ×6 IMPLANT
SCISSORS LAP 5X35 DISP (ENDOMECHANICALS) ×2 IMPLANT
SET IRRIG TUBING LAPAROSCOPIC (IRRIGATION / IRRIGATOR) ×2 IMPLANT
SLEEVE ENDOPATH XCEL 5M (ENDOMECHANICALS) ×3 IMPLANT
SUT MNCRL AB 4-0 PS2 18 (SUTURE) ×3 IMPLANT
TOWEL OR 17X26 10 PK STRL BLUE (TOWEL DISPOSABLE) ×3 IMPLANT
TRAY LAPAROSCOPIC (CUSTOM PROCEDURE TRAY) ×3 IMPLANT
TROCAR XCEL BLUNT TIP 100MML (ENDOMECHANICALS) ×2 IMPLANT
TROCAR XCEL NON-BLD 11X100MML (ENDOMECHANICALS) ×3 IMPLANT
TROCAR XCEL NON-BLD 5MMX100MML (ENDOMECHANICALS) ×3 IMPLANT

## 2014-07-24 NOTE — H&P (View-Only) (Signed)
Chief complaint:  New pancreatic cancer  Referring MD: Dr. Benay Spice  HISTORY: Pt is a 38 F who is here in consultation at the request of Dr. Benay Spice for a new pancreatic cancer.  She had shingles in June, but continued to have nausea and left sided abdominal pain even after lesions cleared up.  She underwent an ultrasound to look at her gallbladder and was found to have a mass in the head of her pancreas.  MRI demonstrated a 4 cm mass with necrosis in the uncinate process.  She also had an enlarged portacaval lymph node.  PET scan was positive in the pancreatic uncinate and in that lymph node.  EUS biopsy was positive for adenocarcinoma.  She has had some weight loss, but not a significant amount until the last 1-2 months.    She denies diarrhea, jaundice, vomiting.  She has had early satiety and decreased appetite.  She is not having any other rashes, and her shingles is better.  She continues to have some low grade left upper quadrant pain.    Past Medical History  Diagnosis Date  . Chicken pox   . Depression   . Allergy   . Hypertension   . Hyperlipidemia   . Polyp of colon   . Shingles JUNE 2015    RIGHT  SHOULDER  . Asthma     exercise induced  . Anxiety     since diagnosis  . Arthritis     mild    Past Surgical History  Procedure Laterality Date  . Breast reduction surgery Bilateral   . Colonscopy   LAST DONE SEPT 2014    X 3  . Hysteroscopy  2005  . Eus N/A 07/12/2014    Procedure: ESOPHAGEAL ENDOSCOPIC ULTRASOUND (EUS) RADIAL;  Surgeon: Arta Silence, MD;  Location: [facility];  Service: Endoscopy;  Laterality: N/A;  . Fine needle aspiration N/A 07/12/2014    Procedure: FINE NEEDLE ASPIRATION (FNA) RADIAL;  Surgeon: Arta Silence, MD;  Location: [facility];  Service: Endoscopy;  Laterality: N/A;    Current Outpatient Prescriptions  Medication Sig Dispense Refill  . atorvastatin (LIPITOR) 10 MG tablet Take 1 tablet (10 mg total) by mouth daily.  90 tablet  1  .  budesonide-formoterol (SYMBICORT) 160-4.5 MCG/ACT inhaler Inhale 1 puff into the lungs at bedtime.       . cetirizine (ZYRTEC) 10 MG tablet Take 10 mg by mouth daily.      . hydrochlorothiazide (HYDRODIURIL) 25 MG tablet Take 1 tablet (25 mg total) by mouth daily.  90 tablet  1  . lisinopril (PRINIVIL,ZESTRIL) 40 MG tablet Take 1 tablet (40 mg total) by mouth daily.  90 tablet  1  . Multiple Vitamins-Minerals ([employer] SPECTRAVITE ADULT 50+) TABS Take 1 tablet by mouth daily.       . Probiotic Product (ALIGN) 4 MG CAPS Take 4 mg by mouth daily.       Marland Kitchen ibuprofen (ADVIL,MOTRIN) 200 MG tablet Take 600 mg by mouth every 8 (eight) hours as needed (pain).      . LORazepam (ATIVAN) 0.5 MG tablet Take 0.5 mg by mouth every 6 (six) hours as needed for anxiety.       No current facility-administered medications for this visit.     No Known Allergies   Family History  Problem Relation Age of Onset  . Arthritis Mother   . Hypertension Mother   . Alcohol abuse Father   . Cancer Father     brain tumor  Pancreas cancer - grandmother 2 first cousins breast cancer   History   Social History  . Marital Status: Married    Spouse Name: N/A    Number of Children: N/A  . Years of Education: N/A   Social History Main Topics  . Smoking status: Former Smoker -- 0.50 packs/day for 5 years    Quit date: 12/01/1978  . Smokeless tobacco: Never Used  . Alcohol Use: Yes     Comment: VERY RARE  . Drug Use: No  . Sexual Activity: None   Other Topics Concern  . None   Social History Narrative  . None     REVIEW OF SYSTEMS - PERTINENT POSITIVES ONLY: 12 point review of systems negative other than HPI and PMH except for unexpected weight loss  EXAM: Filed Vitals:   07/21/14 0957  BP: 142/90  Pulse: 64  Temp: 98.3 F (36.8 C)  Resp: 14    Wt Readings from Last 3 Encounters:  07/21/14 192 lb 12.8 oz (87.454 kg)  07/17/14 194 lb (87.998 kg)  07/12/14 195 lb (88.451 kg)     Gen:  No acute  distress.  Well nourished and well groomed.   Neurological: Alert and oriented to person, place, and time. Coordination normal.  Head: Normocephalic and atraumatic.  Eyes: Conjunctivae are normal. Pupils are equal, round, and reactive to light. No scleral icterus.  Neck: Normal range of motion. Neck supple. No tracheal deviation or thyromegaly present.  Cardiovascular: Normal rate, regular rhythm, normal heart sounds and intact distal pulses.  Exam reveals no gallop and no friction rub.  No murmur heard. Respiratory: Effort normal.  No respiratory distress. No chest wall tenderness. Breath sounds normal.  No wheezes, rales or rhonchi.  GI: Soft. Bowel sounds are normal. The abdomen is soft and nontender.  There is no rebound and no guarding.  Musculoskeletal: Normal range of motion. Extremities are nontender.  Lymphadenopathy: No cervical, preauricular, postauricular or axillary adenopathy is present Skin: Skin is warm and dry. No rash noted. No diaphoresis. No erythema. No pallor. No clubbing, cyanosis, or edema.   Psychiatric:Very anxious, tremulous. Behavior is normal. Judgment and thought content normal.    LABORATORY RESULTS: Available labs are reviewed   Recent Results (from the past 2160 hour(s))  BASIC METABOLIC PANEL     Status: None   Collection Time    06/19/14  2:09 PM      Result Value Ref Range   Sodium 140  135 - 145 mEq/L   Potassium 4.0  3.5 - 5.1 mEq/L   Chloride 103  96 - 112 mEq/L   CO2 31  19 - 32 mEq/L   Glucose, Bld 93  70 - 99 mg/dL   BUN 17  6 - 23 mg/dL   Creatinine, Ser 0.9  0.4 - 1.2 mg/dL   Calcium 9.9  8.4 - 10.5 mg/dL   GFR 63.58  >60.00 mL/min  CBC WITH DIFFERENTIAL     Status: None   Collection Time    06/19/14  2:09 PM      Result Value Ref Range   WBC 6.8  4.0 - 10.5 K/uL   RBC 4.58  3.87 - 5.11 Mil/uL   Hemoglobin 13.5  12.0 - 15.0 g/dL   HCT 40.1  36.0 - 46.0 %   MCV 87.6  78.0 - 100.0 fl   MCHC 33.8  30.0 - 36.0 g/dL   RDW 14.2  11.5 -  15.5 %   Platelets 305.0  150.0 -  400.0 K/uL   Neutrophils Relative % 60.7  43.0 - 77.0 %   Lymphocytes Relative 27.1  12.0 - 46.0 %   Monocytes Relative 8.1  3.0 - 12.0 %   Eosinophils Relative 3.6  0.0 - 5.0 %   Basophils Relative 0.5  0.0 - 3.0 %   Neutro Abs 4.2  1.4 - 7.7 K/uL   Lymphs Abs 1.9  0.7 - 4.0 K/uL   Monocytes Absolute 0.6  0.1 - 1.0 K/uL   Eosinophils Absolute 0.2  0.0 - 0.7 K/uL   Basophils Absolute 0.0  0.0 - 0.1 K/uL  HEPATIC FUNCTION PANEL     Status: None   Collection Time    06/19/14  2:09 PM      Result Value Ref Range   Total Bilirubin 0.6  0.2 - 1.2 mg/dL   Bilirubin, Direct 0.1  0.0 - 0.3 mg/dL   Alkaline Phosphatase 75  39 - 117 U/L   AST 33  0 - 37 U/L   ALT 35  0 - 35 U/L   Total Protein 7.4  6.0 - 8.3 g/dL   Albumin 4.3  3.5 - 5.2 g/dL  LIPASE     Status: None   Collection Time    06/19/14  2:09 PM      Result Value Ref Range   Lipase 54.0  11.0 - 59.0 U/L  CANCER ANTIGEN 19-9     Status: Abnormal   Collection Time    07/17/14  3:41 PM      Result Value Ref Range   CA 19-9 7872.6 (*) <35.0 U/mL   Comment: Result repeated and verified.Result confirmed by automatic dilution.  GLUCOSE, CAPILLARY     Status: Abnormal   Collection Time    07/20/14 12:40 PM      Result Value Ref Range   Glucose-Capillary 104 (*) 70 - 99 mg/dL     RADIOLOGY RESULTS: See E-Chart or I-Site for most recent results.  Images and reports are reviewed.  Mr Abdomen W Wo Contrast  07/04/2014   CLINICAL DATA:  Epigastric abdominal pain and nausea. Pancreatic mass seen on ultrasound.  EXAM: MRI ABDOMEN WITHOUT AND WITH CONTRAST  TECHNIQUE: Multiplanar multisequence MR imaging of the abdomen was performed both before and after the administration of intravenous contrast.  CONTRAST:  89mL MULTIHANCE GADOBENATE DIMEGLUMINE 529 MG/ML IV SOLN  COMPARISON:  Abdominal ultrasound on 06/23/2014  FINDINGS: Diffuse pancreatic ductal dilatation is demonstrated. A solid mass with central  necrosis is seen in the pancreatic uncinate process which measures 2.4 x 4.0 cm on image 45 of series 14. This is consistent with pancreatic adenocarcinoma. This mass does not appear to involve the celiac axis, superior mesenteric artery or vein, or portal vein.  11 mm lymph node is seen in the portacaval space on image 31 of series 14, suspicious for metastatic disease. No other lymphadenopathy identified.  There is no evidence of biliary ductal dilatation. Hepatic steatosis is noted, however no liver masses are identified. The gallbladder, spleen, adrenal glands, and kidneys are normal in appearance. No evidence of hydronephrosis. No evidence of abdominal inflammatory process or abnormal fluid collections.  IMPRESSION: 4 cm solid mass with central necrosis in the pancreatic uncinate process, consistent with pancreatic adenocarcinoma.  11 mm portacaval lymph node, suspicious for metastatic disease.  No other metastatic disease identified within the abdomen. No evidence of biliary ductal dilatation.   Electronically Signed   By: Earle Gell M.D.   On: 07/04/2014 12:11  US Abdomen Complete  06/23/2014   CLINICAL DATA:  Epigastric pain radiating to the back with nausea and constipation and unintentional 10 lb weight loss over 6 weeks  EXAM: ULTRASOUND ABDOMEN COMPLETE  COMPARISON:  None.  FINDINGS: Gallbladder:  No gallstones or wall thickening visualized. No sonographic Murphy sign noted.  Common bile duct:  Diameter: 3.4 mm  Liver:  No focal lesion identified. The echotexture is heterogeneous and mildly increased. There is no ductal dilation.  IVC:  Partially obscured by bowel gas  Pancreas:  There is a hypoechoic mass in the pancreatic head measuring 3.2 x 2.1 x 3.6 cm. There is no pancreatic ductal dilation.  Spleen:  Size and appearance within normal limits.  Right Kidney:  Length: 11.2 cm. Echogenicity within normal limits. No mass or hydronephrosis visualized.  Left Kidney:  Length: 12.1 cm. Echogenicity  within normal limits. No mass or hydronephrosis visualized.  Abdominal aorta:  Bowel gas limits evaluation.  Other findings:  No ascites is demonstrated.  IMPRESSION: There is an abnormal hypodense mass measuring up to 3.6 cm in the pancreatic head that is worrisome for malignancy. A pancreatic protocol MRI or CT scan is recommended. No acute abnormality is demonstrated elsewhere.  These results will be called to the ordering clinician or representative by the Radiologist Assistant, and communication documented in the PACS or zVision Dashboard.   Electronically Signed   By: David  Martinique   On: 06/23/2014 11:29   Nm Pet Image Initial (pi) Skull Base To Thigh  07/20/2014   CLINICAL DATA:  Initial treatment strategy for pancreatic adenocarcinoma.  EXAM: NUCLEAR MEDICINE PET SKULL BASE TO THIGH  TECHNIQUE: 11.1 mCi F-18 FDG was injected intravenously. Full-ring PET imaging was performed from the skull base to thigh after the radiotracer. CT data was obtained and used for attenuation correction and anatomic localization.  FASTING BLOOD GLUCOSE:  Value: 104 mg/dl  COMPARISON:  MRI 07/04/2014  FINDINGS: NECK  No hypermetabolic lymph nodes in the neck.  CHEST  No hypermetabolic mediastinal or hilar nodes. No suspicious pulmonary nodules on the CT scan.  ABDOMEN/PELVIS  No hypermetabolic lesion in the uncinate process of pancreas with intense metabolic activity (SUV max 10.5). There is an adjacent intensely hypermetabolic lymph node at the periportal location (image 95 fused series) which was suspicious on comparison MRI and has intense metabolic activity also with SUV max 6.9.  There is no abnormal focal metabolic activity within the liver. There is low attenuation within the right hepatic lobe suggesting hepatic steatosis.  No hypermetabolic peritoneal or omental implants. No abnormal metabolic activity within pelvic lymph nodes.  SKELETON  No focal hypermetabolic activity to suggest skeletal metastasis.  IMPRESSION: 1.  Hypermetabolic mass in the uncinate process of the pancreas consists with primary pancreatic adenocarcinoma. 2. Single hypermetabolic nodal metastasis in periportal nodal station. 3. No evidence of liver metastasis or distant metastasis. 4. Hepatic steatosis in the right hepatic lobe.   Electronically Signed   By: Suzy Bouchard M.D.   On: 07/20/2014 15:12      ASSESSMENT AND PLAN: Adenocarcinoma of uncinate process of pancreas Patient appears to have probably resectable disease, but it is concerning that she has a very high CA 19-9 in association with the lack of jaundice.  She only appears to have the portal node metastatic, but is at high risk for having stage IV disease.    We will schedule her for a diagnostic laparoscopy in order to assess.  This would make a big difference in  the approach to her treatment.  If she had carcinomatosis, she would get more aggressive chemo, and would not get radiation.  If she appears to have localized disease, she would proceed with chemoradiation followed by plan of resection.  I discussed that she would be completely asleep, we would place 1-3 ports and look around for any small suspicious areas on her liver/diaphragm, peritoneal lining, or omentum.  We would send those for biopsy.  If she appears to have metastatic disease, we will place a port a cath for chemo.  We briefly reviewed pancreaticoduodenectomy.  I discussed the role for resection, the prognosis, the recovery and risks of complications.  She is very anxious. She has been given a script for ativan previously, but has not been taking it. I advised her to at least start taking it at night.   45 min spent in counseling with patient and family.       Milus Height MD Surgical Oncology, General and [facility]    Visit Diagnoses: 1. Adenocarcinoma of uncinate process of pancreas     Primary Care Physician: Eulas Post, MD  Other care  team Linus Orn

## 2014-07-24 NOTE — Op Note (Signed)
PRE-OPERATIVE DIAGNOSIS: pancreatic adenocarcinoma uncinate process  POST-OPERATIVE DIAGNOSIS:  Same  PROCEDURE:  Procedure(s): Diagnostic laparoscopy  SURGEON:  Surgeon(s): Stark Klein, MD  ANESTHESIA:   local and general  DRAINS: none   LOCAL MEDICATIONS USED:  BUPIVICAINE  and LIDOCAINE   SPECIMEN:  No Specimen  DISPOSITION OF SPECIMEN:  N/A  COUNTS:  YES  DICTATION: .Dragon Dictation  PLAN OF CARE: Discharge to home after PACU  PATIENT DISPOSITION:  PACU - hemodynamically stable.  EBL:  Minimal  FINDINGS: No evidence of metastatic disease  PROCEDURE:  The patient was identified in the holding area and taken to the operating room where she was placed supine on the operating room table. General anesthesia was induced. The abdomen was prepped and draped in sterile fashion. Timeout was performed according to the surgical safety checklist. When all was correct, we continued.  The infraumbilical skin was anesthetized with local anesthesia. A transverse curvilinear incision was made with #11 blade approximately 1.5 cm in length. The subcutaneous tissues were spread with a Kelly clamp. The fascia was elevated with 2 Kocher clamps. The fascia was then incised in the midline. A Claiborne Billings was used to confirm entrance into the peritoneal cavity. A 0 Vicryl pursestring suture was placed around the fascial incision. The [facility] trocar was introduced into the abdomen and pelvis in place the abdominal wall with the tails of the suture. Pneumoperitoneum was achieved to a pressure of 15 mmHg.  The patient was placed into reverse Trendelenburg position and rotated to the left. The peritoneal surfaces were examined very carefully with the angled scope. The anterior and posterior surfaces of the liver were evaluated. A second port was placed in the midline under direct visualization after administration of local. This is a 5 mm trocar. This was used to assist with manipulation of structures for  visualization. The omentum was examined as well and had no evidence of firm areas or peritoneal implants.  Patient was also placed into Trendelenburg position and no evidence of metastatic disease was seen in the pelvis.  The insufflation was allowed to evacuate. The pursestring suture was tied down at the umbilical site. There was no residual palpable fascial defect. The skin of both incisions was closed with a 4-0 Monocryl in subcuticular fashion. The wounds were then cleaned, dried, and dressed with Dermabond. The patient was allowed to emerge from anesthesia and taken to the PACU in stable condition. Needle, sponge, and instrument counts were correct x2.

## 2014-07-24 NOTE — Interval H&P Note (Signed)
History and Physical Interval Note:  07/24/2014 7:15 AM  Sonya Larson  has presented today for surgery, with the diagnosis of Pancreatic cancer  The various methods of treatment have been discussed with the patient and family. After consideration of risks, benefits and other options for treatment, the patient has consented to  Procedure(s): LAPAROSCOPY DIAGNOSTIC (N/A) as a surgical intervention .  The patient's history has been reviewed, patient examined, no change in status, stable for surgery.  I have reviewed the patient's chart and labs.  Questions were answered to the patient's satisfaction.     Dalonte Hardage

## 2014-07-24 NOTE — Anesthesia Postprocedure Evaluation (Signed)
Anesthesia Post Note  Patient: Sonya Larson  Procedure(s) Performed: Procedure(s) (LRB): LAPAROSCOPY DIAGNOSTIC (N/A)  Anesthesia type: General  Patient location: PACU  Post pain: Pain level controlled and Adequate analgesia  Post assessment: Post-op Vital signs reviewed, Patient's Cardiovascular Status Stable, Respiratory Function Stable, Patent Airway and Pain level controlled  Last Vitals:  Filed Vitals:   07/24/14 0838  BP: 152/81  Pulse: 80  Temp:   Resp: 15    Post vital signs: Reviewed and stable  Level of consciousness: awake, alert  and oriented  Complications: No apparent anesthesia complications

## 2014-07-24 NOTE — Transfer of Care (Signed)
Immediate Anesthesia Transfer of Care Note  Patient: Sonya Larson  Procedure(s) Performed: Procedure(s): LAPAROSCOPY DIAGNOSTIC (N/A)  Patient Location: PACU  Anesthesia Type:General  Level of Consciousness: awake, alert  and oriented  Airway & Oxygen Therapy: Patient Spontanous Breathing  Post-op Assessment: Report given to PACU RN  Post vital signs: Reviewed and stable  Complications: No apparent anesthesia complications

## 2014-07-24 NOTE — Anesthesia Preprocedure Evaluation (Addendum)
Anesthesia Evaluation  Patient identified by MRN, date of birth, ID band Patient awake    Reviewed: Allergy & Precautions, H&P , NPO status , Patient's Chart, lab work & pertinent test results  Airway Mallampati: II  Neck ROM: full    Dental   Pulmonary asthma ,          Cardiovascular hypertension, Pt. on medications Rhythm:Regular Rate:Normal     Neuro/Psych Anxiety Depression    GI/Hepatic Pancreatic CA   Endo/Other  obese  Renal/GU      Musculoskeletal  (+) Arthritis -,   Abdominal (+)  Abdomen: soft.    Peds  Hematology   Anesthesia Other Findings   Reproductive/Obstetrics                          Anesthesia Physical Anesthesia Plan  ASA: II  Anesthesia Plan: General   Post-op Pain Management:    Induction: Intravenous  Airway Management Planned: Oral ETT  Additional Equipment:   Intra-op Plan:   Post-operative Plan: Extubation in OR  Informed Consent: I have reviewed the patients History and Physical, chart, labs and discussed the procedure including the risks, benefits and alternatives for the proposed anesthesia with the patient or authorized representative who has indicated his/her understanding and acceptance.     Plan Discussed with: CRNA, Anesthesiologist and Surgeon  Anesthesia Plan Comments:         Anesthesia Quick Evaluation

## 2014-07-24 NOTE — Discharge Instructions (Addendum)
[facility], [place] [phone]  ABDOMINAL SURGERY: POST OP INSTRUCTIONS  Always review your discharge instruction sheet given to you by the facility where your surgery was performed.  IF YOU HAVE DISABILITY OR FAMILY LEAVE FORMS, YOU MUST BRING THEM TO THE OFFICE FOR PROCESSING.  PLEASE DO NOT GIVE THEM TO YOUR DOCTOR.  1. A prescription for pain medication may be given to you upon discharge.  Take your pain medication as prescribed, if needed.  If narcotic pain medicine is not needed, then you may take acetaminophen (Tylenol) or ibuprofen (Advil) as needed.  If you find that you are taking narcotic pain medication, you may combine with Advil, but DO NOT TAKE TYLENOL IN CONJUNCTION WITH THE PAIN MEDICATION OR YOU WILL BE TAKING TOO MUCH TYLENOL.   2. Take your usually prescribed medications unless otherwise directed. 3. If you need a refill on your pain medication, please contact your pharmacy. They will contact our office to request authorization.  Prescriptions will not be filled after 5pm or on week-ends. 4. You should follow a light diet the first few days after arrival home, such as soup and crackers, pudding, etc.unless your doctor has advised otherwise. A high-fiber, low fat diet can be resumed as tolerated.   Be sure to include lots of fluids daily. Most patients will experience some swelling and bruising on the chest and neck area.  Ice packs will help.  Swelling and bruising can take several days to resolve 5. Most patients will experience some swelling and bruising in the area of the incision. Ice pack will help. Swelling and bruising can take several days to resolve..  6. It is common to experience some constipation if taking pain medication after surgery.  Increasing fluid intake and taking a stool softener will usually help or prevent this problem from occurring.  A mild laxative (Milk of Magnesia or Miralax) should be taken according to package directions if there are  no bowel movements after 48 hours. 7.  You may have steri-strips (small skin tapes) in place directly over the incision.  These strips should be left on the skin for 10-14 days.  If your surgeon used skin glue on the incision, you may shower in 48 hours.  The glue will flake off over the next 2-3 weeks.  Any sutures or staples will be removed at the office during your follow-up visit. You may find that a light gauze bandage over your incision may keep your staples from being rubbed or pulled. You may shower and replace the bandage daily. 8. ACTIVITIES:  You may resume regular (light) daily activities beginning the next day--such as daily self-care, walking, climbing stairs--gradually increasing activities as tolerated.  You may have sexual intercourse when it is comfortable.  Refrain from any heavy lifting or straining until approved by your doctor. a. You may drive when you no longer are taking prescription pain medication, you can comfortably wear a seatbelt, and you can safely maneuver your car and apply brakes b. Return to Work: __________to be determined_________________________ 9. You should see your doctor in the office for a follow-up appointment approximately two weeks after your surgery.  Make sure that you call for this appointment within a day or two after you arrive home to insure a convenient appointment time. OTHER INSTRUCTIONS:  _____________________________________________________________ _____________________________________________________________  WHEN TO CALL YOUR DOCTOR: 1. Fever over 101.0 2. Inability to urinate 3. Nausea and/or vomiting 4. Extreme swelling or bruising 5. Continued bleeding from  incision. 6. Increased pain, redness, or drainage from the incision. 7. Difficulty swallowing or breathing 8. Muscle cramping or spasms. 9. Numbness or tingling in hands or feet or around lips.  The clinic staff is available to answer your questions during regular business hours.   Please dont hesitate to call and ask to speak to one of the nurses if you have concerns.  For further questions, please visit [email or web address]   General Anesthesia, Adult, Care After  Refer to this sheet in the next few weeks. These instructions provide you with information on caring for yourself after your procedure. Your health care provider may also give you more specific instructions. Your treatment has been planned according to current medical practices, but problems sometimes occur. Call your health care provider if you have any problems or questions after your procedure.  WHAT TO EXPECT AFTER THE PROCEDURE  After the procedure, it is typical to experience:  Sleepiness.  Nausea and vomiting. HOME CARE INSTRUCTIONS  For the first 24 hours after general anesthesia:  Have a responsible person with you.  Do not drive a car. If you are alone, do not take public transportation.  Do not drink alcohol.  Do not take medicine that has not been prescribed by your health care provider.  Do not sign important papers or make important decisions.  You may resume a normal diet and activities as directed by your health care provider.  Change bandages (dressings) as directed.  If you have questions or problems that seem related to general anesthesia, call the hospital and ask for the anesthetist or anesthesiologist on call. SEEK MEDICAL CARE IF:  You have nausea and vomiting that continue the day after anesthesia.  You develop a rash. SEEK IMMEDIATE MEDICAL CARE IF:  You have difficulty breathing.  You have chest pain.  You have any allergic problems. Document Released: 02/23/2001 Document Revised: 07/20/2013 Document Reviewed: 06/02/2013  [facility] Patient Information 2014 [place], [place].

## 2014-07-25 ENCOUNTER — Encounter (HOSPITAL_COMMUNITY): Payer: Self-pay | Admitting: General Surgery

## 2014-07-26 ENCOUNTER — Other Ambulatory Visit: Payer: Self-pay | Admitting: *Deleted

## 2014-07-26 ENCOUNTER — Ambulatory Visit (HOSPITAL_BASED_OUTPATIENT_CLINIC_OR_DEPARTMENT_OTHER): Payer: BC Managed Care – PPO | Admitting: Oncology

## 2014-07-26 ENCOUNTER — Encounter: Payer: Self-pay | Admitting: Radiation Oncology

## 2014-07-26 ENCOUNTER — Telehealth: Payer: Self-pay | Admitting: Oncology

## 2014-07-26 VITALS — BP 154/80 | HR 60 | Temp 98.3°F | Resp 20 | Ht 63.0 in | Wt 192.2 lb

## 2014-07-26 DIAGNOSIS — C259 Malignant neoplasm of pancreas, unspecified: Secondary | ICD-10-CM

## 2014-07-26 DIAGNOSIS — E785 Hyperlipidemia, unspecified: Secondary | ICD-10-CM

## 2014-07-26 DIAGNOSIS — Z800 Family history of malignant neoplasm of digestive organs: Secondary | ICD-10-CM

## 2014-07-26 DIAGNOSIS — I10 Essential (primary) hypertension: Secondary | ICD-10-CM

## 2014-07-26 DIAGNOSIS — C250 Malignant neoplasm of head of pancreas: Secondary | ICD-10-CM

## 2014-07-26 MED ORDER — CAPECITABINE 500 MG PO TABS: ORAL_TABLET | ORAL | Status: DC

## 2014-07-26 NOTE — Progress Notes (Signed)
  [facility] OFFICE PROGRESS NOTE   Diagnosis: Pancreas cancer  INTERVAL HISTORY:   She returns as scheduled. The CA 19-9 returned markedly elevated. She was referred to Dr. Barry Dienes. She was taken to a diagnostic laparoscopy 07/24/2014 and there was no evidence of metastatic disease. A staging PET scan 07/20/2012 revealed a hypermetabolic lesion in the uncinate process of the pancreas. An adjacent hypermetabolic lymph node was seen in the periportal area. No evidence of distant metastatic disease. She reports soreness of the abdomen following the procedure earlier this week. Her pain is controlled with ibuprofen.  Objective:  Vital signs in last 24 hours:  Blood pressure 154/80, pulse 60, temperature 98.3 F (36.8 C), temperature source Oral, resp. rate 20, height 5\' 3"  (1.6 m), weight 192 lb 3.2 oz (87.181 kg), last menstrual period 10/31/2005.    Resp: Lungs clear bilaterally Cardio: Regular rate and rhythm GI: Faint erythema inferior to the umbilical laparoscopy site. Vascular: No leg edema   Lab Results:  Lab Results  Component Value Date   WBC 4.2 07/24/2014   HGB 12.3 07/24/2014   HCT 37.1 07/24/2014   MCV 85.9 07/24/2014   PLT 238 07/24/2014   NEUTROABS 4.2 06/19/2014    CA 19-9 on 07/17/2012-7872  Imaging:  Dg Chest 2 View  07/24/2014   CLINICAL DATA:  Preoperative exploratory laparotomy. No chest complaints.  EXAM: CHEST  2 VIEW  COMPARISON:  11/05/2011  FINDINGS: The heart size and mediastinal contours are within normal limits. Both lungs are clear. The visualized skeletal structures are unremarkable.  IMPRESSION: No active cardiopulmonary disease.   Electronically Signed   By: Lucienne Capers M.D.   On: 07/24/2014 06:42    Medications: I have reviewed the patient's current medications.  Assessment/Plan: 1. Adenocarcinoma the pancreas, uncinate mass, EUS April 2015 revealed a uT3,uN1 lesion with an FNA biopsy confirming adenocarcinoma MRI abdomen  07/04/2014 confirmed a pancreas uncinate 8 mass, 11 mm portacaval lymph node, no vascular involvement, and no evidence of distant metastatic disease PET scan 07/20/2012 with a hypermetabolic uncinate process mass and hypermetabolic periportal lymph node Markedly elevated CA 19-9 Negative diagnostic laparoscopy 07/24/2014 2. Hypertension 3. Hyperlipidemia  4. vaginal spotting summer 2015-evaluated by gynecology  5. zoster rash at June 2015  6. Asthma  7. family history of pancreas cancer    Disposition:  She appears stable. Sonya Larson has been diagnosed with locally advanced pancreas cancer with a markedly elevated CA 19-9. Her case was presented at the GI tumor conference this morning. The consensus opinion is to proceed with neoadjuvant chemotherapy and radiation and then undergo restaging. She appears to be a surgical candidate if the restaging evaluation reveals no metastatic disease.  I recommend concurrent capecitabine and radiation. She will be referred to Dr. Lisbeth Renshaw. We discussed the potential toxicities associated with capecitabine including the chance for nausea, hematologic toxicity, diarrhea, and mucositis. We discussed the rash, hyperpigmentation, and hand/foot syndrome associated with capecitabine. She agrees to proceed. Ms. Correia will attend a chemotherapy teaching class.  I anticipate concurrent therapy starting on 08/08/2014. She will return for an office visit 08/21/2014. She will contact us in the interim for new symptoms, specifically diarrhea or hand/foot pain. There is mild erythema inferior to the umbilicus today. She will seek medical attention if this area enlarges.  Betsy Coder, MD  07/26/2014  11:44 AM

## 2014-07-26 NOTE — Telephone Encounter (Signed)
Pt confirmed labs/ov per 08/26 POF, gave pt AVS....KJ °

## 2014-07-26 NOTE — Progress Notes (Signed)
GI Location of Tumor / Histology: Pancreas  [name] presented  months ago with symptoms of: Abdominal pain,unintentional weight loss,nausea beginning June 215  Biopsies of  (if applicable) revealed: Diagnosis 07/12/2014: FINE NEEDLE ASPIRATION NEEDLE ASPIRATION NEEDLE ASPIRATION:NEEDLE ASPIRATION,ENDOSCOPIC, PANCREAS (SPECIMEN 1 OF 1 COLLECTED 07/12/14):ADENOCARCINOMA. EUS,Dr. Shon Baton  Past/Anticipated interventions by surgeon, if any: Dr. Ralene Muskrat 07/24/14 diagnostic laparoscopy  Past/Anticipated interventions by medical oncology, if any: Dr. Benay Spice last seen  07/26/14, scheduled Chemotherapy class 08/02/14 after Nutrition with Ernestene Kiel, follow appt  08/21/14   Weight changes, if any: 15 lb wt. loss  Bowel/Bladder complaints, if any: constipation,started colace OTC daily prn  Nausea / Vomiting, if any: none   Pain issues, if any:  none  SAFETY ISSUES: none  Prior radiation? NO  Pacemaker/ICD? no  Possible current pregnancy? No  Is the patient on methotrexate? No  Current Complaints / other details: Married,  Retired,  G2P2,  Breast reduction surgery b/l 1980, Colonoscopy x3 last 08/2013, neg., steroscopy 2005, former smoker 0.5ppd x 5 years, quit 1980;  smoker,no smokeless tobacco, drinks wine rare occasion, no illicit drug use, Shingles 05/2014 right upper chest and back ,depression, anxiety, HTn asthma, Father brain tumor, maternal grandmother died of pancreatic cancer age 47 2 maternal rst cousins breast cancer  Allergies: NKDA

## 2014-07-27 ENCOUNTER — Telehealth: Payer: Self-pay | Admitting: *Deleted

## 2014-07-27 NOTE — Telephone Encounter (Signed)
Insurance requires she obtain Xeloda from [facility] : Phone [phone] Fax [phone] Faxed script with demographics and insurance card copy to [facility].

## 2014-07-28 ENCOUNTER — Encounter: Payer: Self-pay | Admitting: *Deleted

## 2014-07-28 NOTE — Progress Notes (Signed)
[facility] Psychosocial Distress Screening Clinical Social Work  Clinical Social Work was referred by distress screening protocol.  The patient scored a 7 on the Psychosocial Distress Thermometer which indicates moderate distress. Clinical Social Worker phoned pt to assess for distress and other psychosocial needs. Pt shared concerns regarding anxiety with upcoming treatment. Pt has Ativan Rx, but is not sure if it is helping as she has only taken once or twice. CSW discussed common emotions experienced with new cancer diagnosis and coping resources. CSW educated pt about [facility] and resources to assist. Pt very interested in GI Group and completing ADRs. CSW discussed process. Pt has packet and would like to complete prior to chemo class on 08/02/14. Informed pt CSW that is available that day will contact her to confirm time to complete ADRS. Pt agrees to reach out if other needs/concerns arise.   ONCBCN DISTRESS SCREENING 07/26/2014  Screening Type Initial Screening  Elta [place] the number that describes how much distress you have been experiencing in the past week 7  Emotional problem type Nervousness/Anxiety;Adjusting to illness  Physical Problem type Pain;Nausea/vomiting  Physician notified of physical symptoms Yes    Clinical Social Worker follow up needed: Yes.    If yes, follow up plan: See above plan  Loren Racer, [place] Worker [facility] [place] for [facility] Wednesday, Thursday and Friday Phone: [phone] Fax: [phone]

## 2014-07-31 ENCOUNTER — Encounter: Payer: Self-pay | Admitting: Oncology

## 2014-07-31 ENCOUNTER — Ambulatory Visit
Admission: RE | Admit: 2014-07-31 | Discharge: 2014-07-31 | Disposition: A | Discharge status: Home / Self Care | Payer: BC Managed Care – PPO | Source: Ambulatory Visit | Attending: Radiation Oncology | Admitting: Radiation Oncology

## 2014-07-31 ENCOUNTER — Encounter: Payer: Self-pay | Admitting: Radiation Oncology

## 2014-07-31 VITALS — BP 144/84 | HR 64 | Temp 98.0°F | Resp 20 | Ht 63.25 in | Wt 191.6 lb

## 2014-07-31 DIAGNOSIS — F411 Generalized anxiety disorder: Secondary | ICD-10-CM | POA: Diagnosis not present

## 2014-07-31 DIAGNOSIS — E785 Hyperlipidemia, unspecified: Secondary | ICD-10-CM | POA: Insufficient documentation

## 2014-07-31 DIAGNOSIS — J45909 Unspecified asthma, uncomplicated: Secondary | ICD-10-CM | POA: Insufficient documentation

## 2014-07-31 DIAGNOSIS — Z87891 Personal history of nicotine dependence: Secondary | ICD-10-CM | POA: Insufficient documentation

## 2014-07-31 DIAGNOSIS — C250 Malignant neoplasm of head of pancreas: Secondary | ICD-10-CM

## 2014-07-31 DIAGNOSIS — I10 Essential (primary) hypertension: Secondary | ICD-10-CM | POA: Diagnosis not present

## 2014-07-31 DIAGNOSIS — Z510 Encounter for antineoplastic radiation therapy: Secondary | ICD-10-CM | POA: Insufficient documentation

## 2014-07-31 HISTORY — DX: Malignant neoplasm of pancreas, unspecified: C25.9

## 2014-07-31 NOTE — Progress Notes (Signed)
Radiation Oncology         [phone] ________________________________  Name: Sonya Larson MRN: [id number]  Date: 07/31/2014  DOB: [birth year]  JQ:[id number],TMAUQ W, MD  Ladell Pier, MD     REFERRING PHYSICIAN: Ladell Pier, MD   DIAGNOSIS: The encounter diagnosis was Adenocarcinoma of head of pancreas.   HISTORY OF PRESENT ILLNESS::Sonya Larson is a 63 y.o. female who is seen for an initial consultation visit. The patient indicates that she began experiencing some abdominal pain and nausea in June of this year. This occurred around the time she began having shingles and she therefore attributed her symptoms to this episode. However as her shingles resolved, the pain and nausea persisted. She underwent an abdominal ultrasound which showed a hypoechoic mass noted within the pancreas. No pancreatic ductal dilatation was seen. She therefore proceeded to undergo an MRI scan of the abdomen on 07/04/2014. A solid mass with central necrosis was seen within the uncinate process of the pancreas. This measured 4 cm. He was not found to involve the adjacent vasculature. An adjacent 11 mm lymph node was seen which was suspicious for regional disease. No other lymphadenopathy was seen. No suspicious liver masses were present.  The patient was referred to undergo an endoscopic ultrasound on 07/12/2014. A hypoechoic mass was noted within the uncinate process. No involvement of the vasculature was seen. Based on ultrasound, this was diagnosed as a T3 N1 lesion. Biopsy was obtained and this adenocarcinoma.  The patient underwent a staging PET scan on 07/20/2014. This revealed a hypermetabolic lesion within the uncinate process of the pancreas. The adjacent lymph node was hypermetabolic. No evidence of distant disease.  The patient has been found to have a markedly elevated CA 19-9 tumor marker. She proceeded with a diagnostic laparoscopy on 07/24/2014. There is no evidence of metastatic the at  that time. Her case has been discussed in multidisciplinary GI conference. A preoperative regimen of chemotherapy radiation treatment was recommended as her initial treatment followed by hopeful surgical resection after reevaluation.   PREVIOUS RADIATION THERAPY: No   PAST MEDICAL HISTORY:  has a past medical history of Chicken pox; Depression; Hypertension; Hyperlipidemia; Polyp of colon; Shingles (JUNE 2015); Asthma; Anxiety; Arthritis; Pancreatic cancer (07/12/14); and Allergy.     PAST SURGICAL HISTORY: Past Surgical History  Procedure Laterality Date  . Breast reduction surgery Bilateral   . Colonscopy   LAST DONE SEPT 2014    X 3  . Hysteroscopy  2005  . Eus N/A 07/12/2014    Procedure: ESOPHAGEAL ENDOSCOPIC ULTRASOUND (EUS) RADIAL;  Surgeon: Arta Silence, MD;  Location: [facility];  Service: Endoscopy;  Laterality: N/A;  . Fine needle aspiration N/A 07/12/2014    Procedure: FINE NEEDLE ASPIRATION (FNA) RADIAL;  Surgeon: Arta Silence, MD;  Location: [facility];  Service: Endoscopy;  Laterality: N/A;  . Laparoscopy N/A 07/24/2014    Procedure: LAPAROSCOPY DIAGNOSTIC;  Surgeon: Stark Klein, MD;  Location: [facility];  Service: General;  Laterality: N/A;     FAMILY HISTORY: family history includes Alcohol abuse in her father; Arthritis in her mother; Cancer in her cousin and father; Hypertension in her mother.   SOCIAL HISTORY:  reports that she quit smoking about 35 years ago. She has never used smokeless tobacco. She reports that she drinks alcohol. She reports that she does not use illicit drugs.   ALLERGIES: Review of patient's allergies indicates not on file.   MEDICATIONS:  Current Outpatient Prescriptions  Medication Sig Dispense Refill  .  atorvastatin (LIPITOR) 10 MG tablet Take 1 tablet (10 mg total) by mouth daily.  90 tablet  1  . budesonide-formoterol (SYMBICORT) 160-4.5 MCG/ACT inhaler Inhale 1 puff into the lungs at bedtime.       . cetirizine (ZYRTEC) 10 MG  tablet Take 10 mg by mouth daily.      Marland Kitchen docusate sodium (COLACE) 100 MG capsule Take 100 mg by mouth daily as needed for mild constipation.      . hydrochlorothiazide (HYDRODIURIL) 25 MG tablet Take 1 tablet (25 mg total) by mouth daily.  90 tablet  1  . ibuprofen (ADVIL,MOTRIN) 200 MG tablet Take 600 mg by mouth every 8 (eight) hours as needed (pain).      Marland Kitchen lisinopril (PRINIVIL,ZESTRIL) 40 MG tablet Take 1 tablet (40 mg total) by mouth daily.  90 tablet  1  . LORazepam (ATIVAN) 0.5 MG tablet Take 0.5 mg by mouth every 6 (six) hours as needed for anxiety.      . Multiple Vitamins-Minerals ([facility] SPECTRAVITE ADULT 50+) TABS Take 1 tablet by mouth daily.       . Probiotic Product (ALIGN) 4 MG CAPS Take 4 mg by mouth daily.       . capecitabine (XELODA) 500 MG tablet Take 3 tablet (=1500mg ) AM; Take 3 tablet (=1500mg ) PM.  Total daily dose 3000mg .  Take on days of RADIATION ONLY.  168 tablet  0   No current facility-administered medications for this encounter.     REVIEW OF SYSTEMS:  A 15 point review of systems is documented in the electronic medical record. This was obtained by the nursing staff. However, I reviewed this with the patient to discuss relevant findings and make appropriate changes.  Pertinent items are noted in HPI.    PHYSICAL EXAM:  height is 5' 3.25" (1.607 m) and weight is 191 lb 9.6 oz (86.909 kg). Her oral temperature is 98 F (36.7 C). Her blood pressure is 144/84 and her pulse is 64. Her respiration is 20.   ECOG = 1  0 - Asymptomatic (Fully active, able to carry on all predisease activities without restriction)  1 - Symptomatic but completely ambulatory (Restricted in physically strenuous activity but ambulatory and able to carry out work of a light or sedentary nature. For example, light housework, office work)  2 - Symptomatic, <50% in bed during the day (Ambulatory and capable of all self care but unable to carry out any work activities. Up and about more than 50% of  waking hours)  3 - Symptomatic, >50% in bed, but not bedbound (Capable of only limited self-care, confined to bed or chair 50% or more of waking hours)  4 - Bedbound (Completely disabled. Cannot carry on any self-care. Totally confined to bed or chair)  5 - Death   Eustace Pen MM, Creech RH, Tormey DC, et al. [id number]). "Toxicity and response criteria of the [facility]". [place] Oncol. 5 (6): 649-55  General: Well-developed, in no acute distress HEENT: Normocephalic, atraumatic; oral cavity clear Neck: Supple without any lymphadenopathy Cardiovascular: Regular rate and rhythm Respiratory: Clear to auscultation bilaterally GI: Soft, nontender, normal bowel sounds Extremities: No edema present Neuro: No focal deficits     LABORATORY DATA:  Lab Results  Component Value Date   WBC 4.2 07/24/2014   HGB 12.3 07/24/2014   HCT 37.1 07/24/2014   MCV 85.9 07/24/2014   PLT 238 07/24/2014   Lab Results  Component Value Date   NA 139 07/24/2014  K 3.9 07/24/2014   CL 102 07/24/2014   CO2 26 07/24/2014   Lab Results  Component Value Date   ALT 35 06/19/2014   AST 33 06/19/2014   ALKPHOS 75 06/19/2014   BILITOT 0.6 06/19/2014      RADIOGRAPHY: Dg Chest 2 View  07/24/2014   CLINICAL DATA:  Preoperative exploratory laparotomy. No chest complaints.  EXAM: CHEST  2 VIEW  COMPARISON:  11/05/2011  FINDINGS: The heart size and mediastinal contours are within normal limits. Both lungs are clear. The visualized skeletal structures are unremarkable.  IMPRESSION: No active cardiopulmonary disease.   Electronically Signed   By: Lucienne Capers M.D.   On: 07/24/2014 06:42   Mr Abdomen W Wo Contrast  07/04/2014   CLINICAL DATA:  Epigastric abdominal pain and nausea. Pancreatic mass seen on ultrasound.  EXAM: MRI ABDOMEN WITHOUT AND WITH CONTRAST  TECHNIQUE: Multiplanar multisequence MR imaging of the abdomen was performed both before and after the administration of intravenous contrast.   CONTRAST:  48mL MULTIHANCE GADOBENATE DIMEGLUMINE 529 MG/ML IV SOLN  COMPARISON:  Abdominal ultrasound on 06/23/2014  FINDINGS: Diffuse pancreatic ductal dilatation is demonstrated. A solid mass with central necrosis is seen in the pancreatic uncinate process which measures 2.4 x 4.0 cm on image 45 of series 14. This is consistent with pancreatic adenocarcinoma. This mass does not appear to involve the celiac axis, superior mesenteric artery or vein, or portal vein.  11 mm lymph node is seen in the portacaval space on image 31 of series 14, suspicious for metastatic disease. No other lymphadenopathy identified.  There is no evidence of biliary ductal dilatation. Hepatic steatosis is noted, however no liver masses are identified. The gallbladder, spleen, adrenal glands, and kidneys are normal in appearance. No evidence of hydronephrosis. No evidence of abdominal inflammatory process or abnormal fluid collections.  IMPRESSION: 4 cm solid mass with central necrosis in the pancreatic uncinate process, consistent with pancreatic adenocarcinoma.  11 mm portacaval lymph node, suspicious for metastatic disease.  No other metastatic disease identified within the abdomen. No evidence of biliary ductal dilatation.   Electronically Signed   By: Earle Gell M.D.   On: 07/04/2014 12:11   Nm Pet Image Initial (pi) Skull Base To Thigh  07/20/2014   CLINICAL DATA:  Initial treatment strategy for pancreatic adenocarcinoma.  EXAM: NUCLEAR MEDICINE PET SKULL BASE TO THIGH  TECHNIQUE: 11.1 mCi F-18 FDG was injected intravenously. Full-ring PET imaging was performed from the skull base to thigh after the radiotracer. CT data was obtained and used for attenuation correction and anatomic localization.  FASTING BLOOD GLUCOSE:  Value: 104 mg/dl  COMPARISON:  MRI 07/04/2014  FINDINGS: NECK  No hypermetabolic lymph nodes in the neck.  CHEST  No hypermetabolic mediastinal or hilar nodes. No suspicious pulmonary nodules on the CT scan.   ABDOMEN/PELVIS  No hypermetabolic lesion in the uncinate process of pancreas with intense metabolic activity (SUV max 10.5). There is an adjacent intensely hypermetabolic lymph node at the periportal location (image 95 fused series) which was suspicious on comparison MRI and has intense metabolic activity also with SUV max 6.9.  There is no abnormal focal metabolic activity within the liver. There is low attenuation within the right hepatic lobe suggesting hepatic steatosis.  No hypermetabolic peritoneal or omental implants. No abnormal metabolic activity within pelvic lymph nodes.  SKELETON  No focal hypermetabolic activity to suggest skeletal metastasis.  IMPRESSION: 1. Hypermetabolic mass in the uncinate process of the pancreas consists with primary pancreatic  adenocarcinoma. 2. Single hypermetabolic nodal metastasis in periportal nodal station. 3. No evidence of liver metastasis or distant metastasis. 4. Hepatic steatosis in the right hepatic lobe.   Electronically Signed   By: Suzy Bouchard M.D.   On: 07/20/2014 15:12       IMPRESSION: The patient has a diagnosis of adenocarcinoma of the pancreas, T3 N1 M0. She is a candidate for possible surgical resection. Preoperative chemoradiation has been recommended in multidisciplinary GI conference. The patient has seen Dr. Benay Spice and medical oncology who has discussed systemic treatment with her.  I discussed a possible course of radiation treatment with the patient. We discussed the rationale/of such treatment as well as the possible side effects and risks. We also discussed the logistics of treatment which would correspond to a likely 5-1/2 week course of treatment. All of her questions were answered.  The patient wishes to proceed with simulation as soon as possible.   PLAN: The patient will undergo simulation later this week. I anticipate proceeding with her treatment next week. She will receive concurrent Xeloda chemotherapy during this treatment.  Again, I anticipate 5-1/2 weeks of radiation treatment.    I spent 60 minutes face to face with the patient and more than 50% of that time was spent in counseling and/or coordination of care.    ________________________________   Jodelle Gross, MD, PhD   **Disclaimer: This note was dictated with voice recognition software. Similar sounding words can inadvertently be transcribed and this note may contain transcription errors which may not have been corrected upon publication of note.**

## 2014-07-31 NOTE — Progress Notes (Signed)
Please see the Nurse Progress Note in the MD Initial Consult Encounter for this patient. 

## 2014-07-31 NOTE — Progress Notes (Signed)
Faxed xeloda pa form to [facility]

## 2014-07-31 NOTE — Telephone Encounter (Signed)
RECEIVED A FAX FROM [facility] CONCERNING A PRIOR AUTHORIZATION REQUEST FOR XELODA. THIS REQUEST WAS PLACED IN THE MANAGED CARE BIN.

## 2014-08-01 ENCOUNTER — Encounter: Payer: Self-pay | Admitting: Oncology

## 2014-08-01 NOTE — Progress Notes (Signed)
[facility] approved xeloda from 07/31/14-07/31/16

## 2014-08-02 ENCOUNTER — Encounter: Payer: Self-pay | Admitting: Oncology

## 2014-08-02 ENCOUNTER — Ambulatory Visit
Admission: RE | Admit: 2014-08-02 | Discharge: 2014-08-02 | Disposition: A | Discharge status: Home / Self Care | Payer: BC Managed Care – PPO | Source: Ambulatory Visit | Attending: Radiation Oncology | Admitting: Radiation Oncology

## 2014-08-02 ENCOUNTER — Ambulatory Visit: Payer: BC Managed Care – PPO | Admitting: Nutrition

## 2014-08-02 ENCOUNTER — Other Ambulatory Visit: Payer: BC Managed Care – PPO

## 2014-08-02 ENCOUNTER — Encounter: Payer: Self-pay | Admitting: *Deleted

## 2014-08-02 DIAGNOSIS — C250 Malignant neoplasm of head of pancreas: Secondary | ICD-10-CM

## 2014-08-02 DIAGNOSIS — Z510 Encounter for antineoplastic radiation therapy: Secondary | ICD-10-CM | POA: Diagnosis not present

## 2014-08-02 NOTE — Progress Notes (Signed)
63 year old female diagnosed with cancer of the pancreas.  She is a patient of Dr. Benay Spice and Dr. Lisbeth Renshaw.  Past medical history includes chickenpox, depression, hypertension, hyperlipidemia, shingles, asthma, and anxiety.  Medications include Lipitor, Xeloda, Colace, Ativan, multivitamin and align.  Labs include glucose of 110 on August 24.  Height: 63 inches. Weight: 191.6 pounds August 31. Usual body weight 210 pounds 01/23/2014. BMI: 33.65.  Patient expresses concern regarding potential nausea with treatment.  She has been experiencing some constipation, and has begun utilizing a stool softener.  She has questions about foods she should or should not include in her meal plan.  Nutrition diagnosis: Unintended weight loss related to diagnosis of pancreas cancer and associated treatments as evidenced by 9% weight loss over 7 months.  Intervention: Educated patient on healthy, plant-based diet with adequate calories and protein to promote weight maintenance. Recommended 5-6 small meals or snacks daily. Educated patient on strategies for avoiding constipation. Discussed foods that could help anemia. Provided fact sheets on above, as well as contact information. Questions were answered and teach back method was used.  Monitoring, evaluation, goals: Patient will tolerate a healthy, plant-based diet to promote weight maintenance.  Next visit: Patient will contact me for followup if needed.  **Disclaimer: This note was dictated with voice recognition software. Similar sounding words can inadvertently be transcribed and this note may contain transcription errors which may not have been corrected upon publication of note.**

## 2014-08-02 NOTE — Progress Notes (Signed)
As of today no date for chemo. Radiation is on the schedule only.

## 2014-08-04 NOTE — Progress Notes (Signed)
  Radiation Oncology         (336) [phone] ________________________________  Name: Sonya Larson MRN: [id number]  Date: 08/02/2014  DOB: [birth year]  SIMULATION AND TREATMENT PLANNING NOTE   CONSENT VERIFIED: yes   SET UP: Patient is set-up supine   IMMOBILIZATION: The following immobilization is used: alpha-cradle. This complex treatment device will be used on a daily basis during the patient's treatment.   Diagnosis: Pancreatic cancer   NARRATIVE: The patient was brought to the [facility]. Identity was confirmed. All relevant records and images related to the planned course of therapy were reviewed. Then, the patient was positioned in a stable reproducible clinical set-up for radiation therapy using a customized Vac-lock bag. Skin markings were placed. The CT images were loaded into the planning software where the target and avoidance structures were contoured.The radiation prescription was entered and confirmed.   The patient will receive 50.4 Gy in 28 fractions.   Daily image guidance is ordered, and this will be used on a daily basis. This is necessary to ensure accurate and precise localization of the target in addition to accurate alignment of the normal tissue structures in this region.   Treatment planning then occurred.   I have requested : Intensity Modulated Radiotherapy (IMRT) is medically necessary for this case for the following reason: Dose homogeneity; the target is in close proximity to critical normal structures, including the kidneys and the liver. IMRT is thus medically to appropriately treat the patient.   Special treatment procedure  The patient will receive chemotherapy during the course of radiation treatment. The patient may experience increased or overlapping toxicity due to this combined-modality approach and the patient will be monitored for such problems. This may include extra lab work as necessary. This therefore constitutes a special  treatment procedure.    ________________________________  Jodelle Gross, MD, PhD

## 2014-08-08 NOTE — Telephone Encounter (Signed)
RECEIVED A FAX FROM [facility] CONCERNING A CONFIRMATION OF PRESCRIPTION SHIPMENT FOR CAPECITABINE ON 08/03/14.

## 2014-08-09 DIAGNOSIS — Z510 Encounter for antineoplastic radiation therapy: Secondary | ICD-10-CM | POA: Diagnosis not present

## 2014-08-10 ENCOUNTER — Ambulatory Visit
Admission: RE | Admit: 2014-08-10 | Discharge: 2014-08-10 | Disposition: A | Discharge status: Home / Self Care | Payer: BC Managed Care – PPO | Source: Ambulatory Visit | Attending: Radiation Oncology | Admitting: Radiation Oncology

## 2014-08-10 ENCOUNTER — Encounter: Payer: Self-pay | Admitting: Radiation Oncology

## 2014-08-10 VITALS — BP 146/86 | HR 61 | Temp 98.6°F | Resp 16 | Wt 190.0 lb

## 2014-08-10 DIAGNOSIS — C250 Malignant neoplasm of head of pancreas: Secondary | ICD-10-CM

## 2014-08-10 DIAGNOSIS — Z510 Encounter for antineoplastic radiation therapy: Secondary | ICD-10-CM | POA: Diagnosis not present

## 2014-08-10 MED ORDER — RADIAPLEXRX EX GEL
Freq: Once | CUTANEOUS | Status: AC
  Administered 2014-08-10: 14:00:00 via TOPICAL

## 2014-08-10 NOTE — Progress Notes (Signed)
Weekly rad txs1st  Abdomen, patient education done, radiation therapy and you book, my business card, radiaplex gel cream given, discussed skin irritation, pain,  fatigue, nausea,vomiting, diarrhea, , urinary changes, may need to eat 5-6 smaller meals throughoput the day, have imodium ad on hand prn for diarrhea, increase protein in diet, stay hydrated, saw Ernestene Kiel 08/02/14 for help with nutrition,

## 2014-08-10 NOTE — Progress Notes (Signed)
   Department of Radiation Oncology  Phone:  [phone] Fax:        [phone]  Weekly Treatment Note    Name: Sonya Larson Date: 08/10/2014 MRN: [id number] DOB: Nov 07, 1951   Current dose: 1.8 Gy  Current fraction: 1   MEDICATIONS: Current Outpatient Prescriptions  Medication Sig Dispense Refill  . atorvastatin (LIPITOR) 10 MG tablet Take 1 tablet (10 mg total) by mouth daily.  90 tablet  1  . budesonide-formoterol (SYMBICORT) 160-4.5 MCG/ACT inhaler Inhale 1 puff into the lungs at bedtime.       . capecitabine (XELODA) 500 MG tablet Take 3 tablet (=1500mg ) AM; Take 3 tablet (=1500mg ) PM.  Total daily dose 3000mg .  Take on days of RADIATION ONLY.  168 tablet  0  . cetirizine (ZYRTEC) 10 MG tablet Take 10 mg by mouth daily.      Marland Kitchen docusate sodium (COLACE) 100 MG capsule Take 100 mg by mouth daily as needed for mild constipation.      . ENDOCET 5-325 MG per tablet       . [START ON 08/14/2014] hyaluronate sodium (RADIAPLEXRX) GEL Apply 1 application topically 2 (two) times daily.      . hydrochlorothiazide (HYDRODIURIL) 25 MG tablet Take 1 tablet (25 mg total) by mouth daily.  90 tablet  1  . ibuprofen (ADVIL,MOTRIN) 200 MG tablet Take 600 mg by mouth every 8 (eight) hours as needed (pain).      Marland Kitchen lisinopril (PRINIVIL,ZESTRIL) 40 MG tablet Take 1 tablet (40 mg total) by mouth daily.  90 tablet  1  . LORazepam (ATIVAN) 0.5 MG tablet Take 0.5 mg by mouth every 6 (six) hours as needed for anxiety.      . Multiple Vitamins-Minerals ([facility] SPECTRAVITE ADULT 50+) TABS Take 1 tablet by mouth daily.       . Probiotic Product (ALIGN) 4 MG CAPS Take 4 mg by mouth daily.        No current facility-administered medications for this encounter.     ALLERGIES: Review of patient's allergies indicates not on file.   LABORATORY DATA:  Lab Results  Component Value Date   WBC 4.2 07/24/2014   HGB 12.3 07/24/2014   HCT 37.1 07/24/2014   MCV 85.9 07/24/2014   PLT 238 07/24/2014   Lab  Results  Component Value Date   NA 139 07/24/2014   K 3.9 07/24/2014   CL 102 07/24/2014   CO2 26 07/24/2014   Lab Results  Component Value Date   ALT 35 06/19/2014   AST 33 06/19/2014   ALKPHOS 75 06/19/2014   BILITOT 0.6 06/19/2014     NARRATIVE: Sonya Larson was seen today for weekly treatment management. The chart was checked and the patient's films were reviewed. The patient is doing very well and her first week of treatment. No complaints of far. She had several questions which were answered today regarding the specifics of her treatment.  PHYSICAL EXAMINATION: weight is 190 lb (86.183 kg). Her oral temperature is 98.6 F (37 C). Her blood pressure is 146/86 and her pulse is 61. Her respiration is 16.        ASSESSMENT: The patient is doing satisfactorily with treatment.  PLAN: We will continue with the patient's radiation treatment as planned.

## 2014-08-11 ENCOUNTER — Ambulatory Visit
Admission: RE | Admit: 2014-08-11 | Discharge: 2014-08-11 | Disposition: A | Discharge status: Home / Self Care | Payer: BC Managed Care – PPO | Source: Ambulatory Visit | Attending: Radiation Oncology | Admitting: Radiation Oncology

## 2014-08-11 DIAGNOSIS — Z510 Encounter for antineoplastic radiation therapy: Secondary | ICD-10-CM | POA: Diagnosis not present

## 2014-08-14 ENCOUNTER — Ambulatory Visit
Admission: RE | Admit: 2014-08-14 | Discharge: 2014-08-14 | Disposition: A | Discharge status: Home / Self Care | Payer: BC Managed Care – PPO | Source: Ambulatory Visit | Attending: Radiation Oncology | Admitting: Radiation Oncology

## 2014-08-14 DIAGNOSIS — Z510 Encounter for antineoplastic radiation therapy: Secondary | ICD-10-CM | POA: Diagnosis not present

## 2014-08-15 ENCOUNTER — Ambulatory Visit
Admission: RE | Admit: 2014-08-15 | Discharge: 2014-08-15 | Disposition: A | Discharge status: Home / Self Care | Payer: BC Managed Care – PPO | Source: Ambulatory Visit | Attending: Radiation Oncology | Admitting: Radiation Oncology

## 2014-08-15 DIAGNOSIS — Z510 Encounter for antineoplastic radiation therapy: Secondary | ICD-10-CM | POA: Diagnosis not present

## 2014-08-16 ENCOUNTER — Ambulatory Visit
Admission: RE | Admit: 2014-08-16 | Discharge: 2014-08-16 | Disposition: A | Discharge status: Home / Self Care | Payer: BC Managed Care – PPO | Source: Ambulatory Visit | Attending: Radiation Oncology | Admitting: Radiation Oncology

## 2014-08-16 DIAGNOSIS — Z510 Encounter for antineoplastic radiation therapy: Secondary | ICD-10-CM | POA: Diagnosis not present

## 2014-08-17 ENCOUNTER — Ambulatory Visit: Payer: BC Managed Care – PPO

## 2014-08-18 ENCOUNTER — Ambulatory Visit: Payer: BC Managed Care – PPO | Admitting: Radiation Oncology

## 2014-08-18 ENCOUNTER — Ambulatory Visit: Payer: BC Managed Care – PPO

## 2014-08-21 ENCOUNTER — Ambulatory Visit (HOSPITAL_BASED_OUTPATIENT_CLINIC_OR_DEPARTMENT_OTHER): Payer: BC Managed Care – PPO | Admitting: Nurse Practitioner

## 2014-08-21 ENCOUNTER — Other Ambulatory Visit: Payer: Self-pay | Admitting: Family Medicine

## 2014-08-21 ENCOUNTER — Ambulatory Visit
Admission: RE | Admit: 2014-08-21 | Discharge: 2014-08-21 | Disposition: A | Discharge status: Home / Self Care | Payer: BC Managed Care – PPO | Source: Ambulatory Visit | Attending: Radiation Oncology | Admitting: Radiation Oncology

## 2014-08-21 ENCOUNTER — Telehealth: Payer: Self-pay | Admitting: Nurse Practitioner

## 2014-08-21 ENCOUNTER — Encounter: Payer: Self-pay | Admitting: Oncology

## 2014-08-21 ENCOUNTER — Other Ambulatory Visit (HOSPITAL_BASED_OUTPATIENT_CLINIC_OR_DEPARTMENT_OTHER): Payer: BC Managed Care – PPO

## 2014-08-21 ENCOUNTER — Other Ambulatory Visit: Payer: Self-pay

## 2014-08-21 VITALS — BP 108/66 | HR 66 | Temp 97.5°F | Resp 20 | Ht 63.25 in | Wt 185.8 lb

## 2014-08-21 DIAGNOSIS — C257 Malignant neoplasm of other parts of pancreas: Secondary | ICD-10-CM

## 2014-08-21 DIAGNOSIS — I10 Essential (primary) hypertension: Secondary | ICD-10-CM

## 2014-08-21 DIAGNOSIS — C250 Malignant neoplasm of head of pancreas: Secondary | ICD-10-CM

## 2014-08-21 DIAGNOSIS — Z510 Encounter for antineoplastic radiation therapy: Secondary | ICD-10-CM | POA: Diagnosis not present

## 2014-08-21 LAB — CBC WITH DIFFERENTIAL/PLATELET
BASO%: 1.2 % (ref 0.0–2.0)
Basophils Absolute: 0.1 10*3/uL (ref 0.0–0.1)
EOS%: 9.1 % — AB (ref 0.0–7.0)
Eosinophils Absolute: 0.6 10*3/uL — ABNORMAL HIGH (ref 0.0–0.5)
HEMATOCRIT: 41.2 % (ref 34.8–46.6)
HGB: 13.8 g/dL (ref 11.6–15.9)
LYMPH%: 13 % — AB (ref 14.0–49.7)
MCH: 29.3 pg (ref 25.1–34.0)
MCHC: 33.5 g/dL (ref 31.5–36.0)
MCV: 87.4 fL (ref 79.5–101.0)
MONO#: 0.5 10*3/uL (ref 0.1–0.9)
MONO%: 8.6 % (ref 0.0–14.0)
NEUT#: 4.3 10*3/uL (ref 1.5–6.5)
NEUT%: 68.1 % (ref 38.4–76.8)
PLATELETS: 272 10*3/uL (ref 145–400)
RBC: 4.72 10*6/uL (ref 3.70–5.45)
RDW: 13.6 % (ref 11.2–14.5)
WBC: 6.2 10*3/uL (ref 3.9–10.3)
lymph#: 0.8 10*3/uL — ABNORMAL LOW (ref 0.9–3.3)

## 2014-08-21 LAB — COMPREHENSIVE METABOLIC PANEL (CC13)
ALT: 164 U/L — AB (ref 0–55)
AST: 67 U/L — AB (ref 5–34)
Albumin: 4.1 g/dL (ref 3.5–5.0)
Alkaline Phosphatase: 167 U/L — ABNORMAL HIGH (ref 40–150)
Anion Gap: 13 mEq/L — ABNORMAL HIGH (ref 3–11)
BUN: 18.6 mg/dL (ref 7.0–26.0)
CO2: 26 meq/L (ref 22–29)
CREATININE: 0.9 mg/dL (ref 0.6–1.1)
Calcium: 10 mg/dL (ref 8.4–10.4)
Chloride: 101 mEq/L (ref 98–109)
Glucose: 134 mg/dl (ref 70–140)
Potassium: 3.3 mEq/L — ABNORMAL LOW (ref 3.5–5.1)
Sodium: 141 mEq/L (ref 136–145)
Total Bilirubin: 0.54 mg/dL (ref 0.20–1.20)
Total Protein: 7.7 g/dL (ref 6.4–8.3)

## 2014-08-21 MED ORDER — CAPECITABINE 500 MG PO TABS: ORAL_TABLET | ORAL | Status: DC

## 2014-08-21 MED ORDER — ONDANSETRON HCL 8 MG PO TABS: 8.0000 mg | ORAL_TABLET | Freq: Three times a day (TID) | ORAL | Status: DC | PRN

## 2014-08-21 NOTE — Telephone Encounter (Signed)
RECEIVED A FAX FROM [facility] CONCERNING A PRIOR AUTHORIZATION FOR XELODA. THIS REQUEST WAS PLACED IN THE MANAGED CARE BIN.

## 2014-08-21 NOTE — Progress Notes (Signed)
  [facility] OFFICE PROGRESS NOTE   Diagnosis:  Pancreas cancer  INTERVAL HISTORY:   Sonya Larson returns as scheduled. She began radiation and Xeloda on 08/10/2014. She has had some nausea. No vomiting. Compazine was not effective. She also notes anxiety. No mouth sores. No diarrhea. No hand or foot pain or redness. She has noted some improvement in the abdominal pain.  Objective:  Vital signs in last 24 hours:  Blood pressure 108/66, pulse 66, temperature 97.5 F (36.4 C), temperature source Oral, resp. rate 20, height 5' 3.25" (1.607 m), weight 185 lb 12.8 oz (84.278 kg), last menstrual period 10/31/2005.    HEENT: No thrush or ulcers. Resp: Lungs clear bilaterally. Cardio: Regular rate and rhythm. GI: Abdomen soft. Mild tenderness right upper quadrant. No hepatomegaly. Vascular: No leg edema.  Skin: Palms without erythema.    Lab Results:  Lab Results  Component Value Date   WBC 6.2 08/21/2014   HGB 13.8 08/21/2014   HCT 41.2 08/21/2014   MCV 87.4 08/21/2014   PLT 272 08/21/2014   NEUTROABS 4.3 08/21/2014    Imaging:  No results found.  Medications: I have reviewed the patient's current medications.  Assessment/Plan: 1. Adenocarcinoma the pancreas, uncinate mass, EUS April 2015 revealed a uT3,uN1 lesion with an FNA biopsy confirming adenocarcinoma MRI abdomen 07/04/2014 confirmed a pancreas uncinate 8 mass, 11 mm portacaval lymph node, no vascular involvement, and no evidence of distant metastatic disease  PET scan 07/20/2012 with a hypermetabolic uncinate process mass and hypermetabolic periportal lymph node  Markedly elevated CA 19-9  Negative diagnostic laparoscopy 07/24/2014 Initiation of Xeloda/radiation 08/10/2014. 2. Hypertension 3. Hyperlipidemia  4. vaginal spotting summer 2015-evaluated by gynecology  5. zoster rash at June 2015  6. Asthma  7. family history of pancreas cancer     Disposition: Sonya Larson appears stable. She continues  radiation/Xeloda. A prescription was sent to her pharmacy for Zofran 8 mg every 8 hours as needed for nausea. We scheduled a return visit on 09/07/2014. She will contact the office in the interim with any problems.  Plan reviewed with Dr. Benay Spice.    Ned Card ANP/GNP-BC   08/21/2014  11:11 AM

## 2014-08-21 NOTE — Telephone Encounter (Signed)
Refill OK

## 2014-08-21 NOTE — Progress Notes (Signed)
Faxed xeloda prescription to [facility]

## 2014-08-21 NOTE — Telephone Encounter (Signed)
Pt confirmed labs/ov per 09/21 POF, gave pt AVS....KJ

## 2014-08-21 NOTE — Telephone Encounter (Signed)
Last visit 06/19/14 Last refill 07/10/14 #30 0 refill

## 2014-08-21 NOTE — Progress Notes (Signed)
[facility], [phone], approved xeloda from 07/31/14-07/31/16

## 2014-08-22 ENCOUNTER — Ambulatory Visit
Admission: RE | Admit: 2014-08-22 | Discharge: 2014-08-22 | Disposition: A | Discharge status: Home / Self Care | Payer: BC Managed Care – PPO | Source: Ambulatory Visit | Attending: Radiation Oncology | Admitting: Radiation Oncology

## 2014-08-22 ENCOUNTER — Other Ambulatory Visit: Payer: Self-pay

## 2014-08-22 ENCOUNTER — Telehealth: Payer: Self-pay

## 2014-08-22 DIAGNOSIS — Z510 Encounter for antineoplastic radiation therapy: Secondary | ICD-10-CM | POA: Diagnosis not present

## 2014-08-22 NOTE — Telephone Encounter (Signed)
Called pharmacy, received not from [place] stating rx needed clarification on days for Xeloda. Called and clarified with Pharmacist pt to take on radiation days only and we prescribed 54 per Dr. Learta Codding, MD. RX was verified. And will be sent out to pt.

## 2014-08-23 ENCOUNTER — Other Ambulatory Visit: Payer: Self-pay

## 2014-08-23 ENCOUNTER — Telehealth: Payer: Self-pay

## 2014-08-23 ENCOUNTER — Ambulatory Visit
Admission: RE | Admit: 2014-08-23 | Discharge: 2014-08-23 | Disposition: A | Discharge status: Home / Self Care | Payer: BC Managed Care – PPO | Source: Ambulatory Visit | Attending: Radiation Oncology | Admitting: Radiation Oncology

## 2014-08-23 DIAGNOSIS — Z510 Encounter for antineoplastic radiation therapy: Secondary | ICD-10-CM | POA: Diagnosis not present

## 2014-08-23 DIAGNOSIS — C250 Malignant neoplasm of head of pancreas: Secondary | ICD-10-CM

## 2014-08-23 NOTE — Telephone Encounter (Signed)
Called and informed pt of new lab appt on 9/28. Scheduled pt for 1215 for labs and radiation is to follow at 1300. Pt verbalized understanding, and denies any questions or concerns at this time.

## 2014-08-24 ENCOUNTER — Ambulatory Visit
Admission: RE | Admit: 2014-08-24 | Discharge: 2014-08-24 | Disposition: A | Discharge status: Home / Self Care | Payer: BC Managed Care – PPO | Source: Ambulatory Visit | Attending: Radiation Oncology | Admitting: Radiation Oncology

## 2014-08-24 DIAGNOSIS — Z510 Encounter for antineoplastic radiation therapy: Secondary | ICD-10-CM | POA: Diagnosis not present

## 2014-08-25 ENCOUNTER — Ambulatory Visit
Admission: RE | Admit: 2014-08-25 | Discharge: 2014-08-25 | Disposition: A | Discharge status: Home / Self Care | Payer: BC Managed Care – PPO | Source: Ambulatory Visit | Attending: Radiation Oncology | Admitting: Radiation Oncology

## 2014-08-25 ENCOUNTER — Encounter: Payer: Self-pay | Admitting: Radiation Oncology

## 2014-08-25 VITALS — BP 108/73 | HR 62 | Temp 98.1°F | Resp 20 | Wt 187.2 lb

## 2014-08-25 DIAGNOSIS — Z510 Encounter for antineoplastic radiation therapy: Secondary | ICD-10-CM | POA: Diagnosis not present

## 2014-08-25 DIAGNOSIS — C250 Malignant neoplasm of head of pancreas: Secondary | ICD-10-CM

## 2014-08-25 NOTE — Progress Notes (Signed)
   Department of Radiation Oncology  Phone:  [phone] Fax:        [phone]  Weekly Treatment Note    Name: Sonya Larson Date: 08/25/2014 MRN: [id number] DOB: [birth year]   Current dose: 18 Gy  Current fraction: 10   MEDICATIONS: Current Outpatient Prescriptions  Medication Sig Dispense Refill  . atorvastatin (LIPITOR) 10 MG tablet Take 1 tablet (10 mg total) by mouth daily.  90 tablet  1  . budesonide-formoterol (SYMBICORT) 160-4.5 MCG/ACT inhaler Inhale 1 puff into the lungs at bedtime.       . capecitabine (XELODA) 500 MG tablet Take 3 tablet (=1500mg ) AM; Take 3 tablet (=1500mg ) PM.  Total daily dose 3000mg .  Take on days of RADIATION ONLY.  54 tablet  0  . cetirizine (ZYRTEC) 10 MG tablet Take 10 mg by mouth daily.      Marland Kitchen docusate sodium (COLACE) 100 MG capsule Take 100 mg by mouth daily as needed for mild constipation.      . ENDOCET 5-325 MG per tablet       . hyaluronate sodium (RADIAPLEXRX) GEL Apply 1 application topically 2 (two) times daily.      . hydrochlorothiazide (HYDRODIURIL) 25 MG tablet Take 1 tablet (25 mg total) by mouth daily.  90 tablet  1  . ibuprofen (ADVIL,MOTRIN) 200 MG tablet Take 600 mg by mouth every 8 (eight) hours as needed (pain).      Marland Kitchen lisinopril (PRINIVIL,ZESTRIL) 40 MG tablet Take 1 tablet (40 mg total) by mouth daily.  90 tablet  1  . LORazepam (ATIVAN) 0.5 MG tablet TAKE 1 TABLET BY MOUTH EVERY 6 TO 8 HOURS AS NEEDED FOR SEVERE ANXIETY  30 tablet  0  . Multiple Vitamins-Minerals ([employer] SPECTRAVITE ADULT 50+) TABS Take 1 tablet by mouth daily.       . ondansetron (ZOFRAN) 8 MG tablet Take 1 tablet (8 mg total) by mouth every 8 (eight) hours as needed for nausea or vomiting.  30 tablet  1  . Probiotic Product (ALIGN) 4 MG CAPS Take 4 mg by mouth daily.        No current facility-administered medications for this encounter.     ALLERGIES: Review of patient's allergies indicates not on file.   LABORATORY DATA:  Lab Results    Component Value Date   WBC 6.2 08/21/2014   HGB 13.8 08/21/2014   HCT 41.2 08/21/2014   MCV 87.4 08/21/2014   PLT 272 08/21/2014   Lab Results  Component Value Date   NA 141 08/21/2014   K 3.3* 08/21/2014   CL 102 07/24/2014   CO2 26 08/21/2014   Lab Results  Component Value Date   ALT 164* 08/21/2014   AST 67* 08/21/2014   ALKPHOS 167* 08/21/2014   BILITOT 0.54 08/21/2014     NARRATIVE: Sonya Larson was seen today for weekly treatment management. The chart was checked and the patient's films were reviewed. The patient complains of some ongoing nausea. She began using Zofran medication earlier this week and this is working better.  PHYSICAL EXAMINATION: weight is 187 lb 3.2 oz (84.913 kg). Her oral temperature is 98.1 F (36.7 C). Her blood pressure is 108/73 and her pulse is 62. Her respiration is 20.        ASSESSMENT: The patient is doing satisfactorily with treatment.  PLAN: We will continue with the patient's radiation treatment as planned.

## 2014-08-25 NOTE — Progress Notes (Addendum)
Weekly rad txs abdomen, 10/ completed, takes zofran for nausea, this is helping stated patient, using radiaplex to abdomen, no skin changes, skin intact, having regular bm's, fair appetite ,no pain,takes Xeloda bid,  1:36 PM

## 2014-08-28 ENCOUNTER — Other Ambulatory Visit (HOSPITAL_BASED_OUTPATIENT_CLINIC_OR_DEPARTMENT_OTHER): Payer: BC Managed Care – PPO

## 2014-08-28 ENCOUNTER — Ambulatory Visit
Admission: RE | Admit: 2014-08-28 | Discharge: 2014-08-28 | Disposition: A | Discharge status: Home / Self Care | Payer: BC Managed Care – PPO | Source: Ambulatory Visit | Attending: Radiation Oncology | Admitting: Radiation Oncology

## 2014-08-28 DIAGNOSIS — Z510 Encounter for antineoplastic radiation therapy: Secondary | ICD-10-CM | POA: Diagnosis not present

## 2014-08-28 DIAGNOSIS — C250 Malignant neoplasm of head of pancreas: Secondary | ICD-10-CM

## 2014-08-28 LAB — COMPREHENSIVE METABOLIC PANEL (CC13)
ALT: 88 U/L — ABNORMAL HIGH (ref 0–55)
ANION GAP: 9 meq/L (ref 3–11)
AST: 39 U/L — AB (ref 5–34)
Albumin: 3.8 g/dL (ref 3.5–5.0)
Alkaline Phosphatase: 119 U/L (ref 40–150)
BUN: 10.6 mg/dL (ref 7.0–26.0)
CALCIUM: 9.9 mg/dL (ref 8.4–10.4)
CO2: 29 meq/L (ref 22–29)
CREATININE: 0.9 mg/dL (ref 0.6–1.1)
Chloride: 102 mEq/L (ref 98–109)
Glucose: 142 mg/dl — ABNORMAL HIGH (ref 70–140)
Potassium: 3.4 mEq/L — ABNORMAL LOW (ref 3.5–5.1)
Sodium: 140 mEq/L (ref 136–145)
Total Bilirubin: 0.55 mg/dL (ref 0.20–1.20)
Total Protein: 7 g/dL (ref 6.4–8.3)

## 2014-08-28 LAB — CBC WITH DIFFERENTIAL/PLATELET
BASO%: 0.7 % (ref 0.0–2.0)
BASOS ABS: 0 10*3/uL (ref 0.0–0.1)
EOS%: 8.1 % — AB (ref 0.0–7.0)
Eosinophils Absolute: 0.4 10*3/uL (ref 0.0–0.5)
HCT: 38.3 % (ref 34.8–46.6)
HGB: 12.6 g/dL (ref 11.6–15.9)
LYMPH%: 9.5 % — ABNORMAL LOW (ref 14.0–49.7)
MCH: 28.9 pg (ref 25.1–34.0)
MCHC: 32.9 g/dL (ref 31.5–36.0)
MCV: 87.9 fL (ref 79.5–101.0)
MONO#: 0.4 10*3/uL (ref 0.1–0.9)
MONO%: 7.6 % (ref 0.0–14.0)
NEUT%: 74.1 % (ref 38.4–76.8)
NEUTROS ABS: 3.7 10*3/uL (ref 1.5–6.5)
Platelets: 229 10*3/uL (ref 145–400)
RBC: 4.36 10*6/uL (ref 3.70–5.45)
RDW: 13.5 % (ref 11.2–14.5)
WBC: 5 10*3/uL (ref 3.9–10.3)
lymph#: 0.5 10*3/uL — ABNORMAL LOW (ref 0.9–3.3)

## 2014-08-29 ENCOUNTER — Ambulatory Visit
Admission: RE | Admit: 2014-08-29 | Discharge: 2014-08-29 | Disposition: A | Discharge status: Home / Self Care | Payer: BC Managed Care – PPO | Source: Ambulatory Visit | Attending: Radiation Oncology | Admitting: Radiation Oncology

## 2014-08-29 DIAGNOSIS — Z510 Encounter for antineoplastic radiation therapy: Secondary | ICD-10-CM | POA: Diagnosis not present

## 2014-08-30 ENCOUNTER — Ambulatory Visit
Admission: RE | Admit: 2014-08-30 | Discharge: 2014-08-30 | Disposition: A | Discharge status: Home / Self Care | Payer: BC Managed Care – PPO | Source: Ambulatory Visit | Attending: Radiation Oncology | Admitting: Radiation Oncology

## 2014-08-30 DIAGNOSIS — Z510 Encounter for antineoplastic radiation therapy: Secondary | ICD-10-CM | POA: Diagnosis not present

## 2014-08-31 ENCOUNTER — Ambulatory Visit
Admission: RE | Admit: 2014-08-31 | Discharge: 2014-08-31 | Disposition: A | Discharge status: Home / Self Care | Payer: BC Managed Care – PPO | Source: Ambulatory Visit | Attending: Radiation Oncology | Admitting: Radiation Oncology

## 2014-08-31 DIAGNOSIS — R12 Heartburn: Secondary | ICD-10-CM | POA: Insufficient documentation

## 2014-08-31 DIAGNOSIS — J45909 Unspecified asthma, uncomplicated: Secondary | ICD-10-CM | POA: Insufficient documentation

## 2014-08-31 DIAGNOSIS — E785 Hyperlipidemia, unspecified: Secondary | ICD-10-CM | POA: Insufficient documentation

## 2014-08-31 DIAGNOSIS — C250 Malignant neoplasm of head of pancreas: Secondary | ICD-10-CM | POA: Insufficient documentation

## 2014-08-31 DIAGNOSIS — I10 Essential (primary) hypertension: Secondary | ICD-10-CM | POA: Insufficient documentation

## 2014-08-31 DIAGNOSIS — R112 Nausea with vomiting, unspecified: Secondary | ICD-10-CM | POA: Insufficient documentation

## 2014-08-31 DIAGNOSIS — K5900 Constipation, unspecified: Secondary | ICD-10-CM | POA: Insufficient documentation

## 2014-08-31 DIAGNOSIS — Z510 Encounter for antineoplastic radiation therapy: Secondary | ICD-10-CM | POA: Diagnosis not present

## 2014-09-01 ENCOUNTER — Encounter: Payer: Self-pay | Admitting: Radiation Oncology

## 2014-09-01 ENCOUNTER — Ambulatory Visit
Admission: RE | Admit: 2014-09-01 | Discharge: 2014-09-01 | Disposition: A | Discharge status: Home / Self Care | Payer: BC Managed Care – PPO | Source: Ambulatory Visit | Attending: Radiation Oncology | Admitting: Radiation Oncology

## 2014-09-01 DIAGNOSIS — Z510 Encounter for antineoplastic radiation therapy: Secondary | ICD-10-CM | POA: Diagnosis not present

## 2014-09-01 DIAGNOSIS — C250 Malignant neoplasm of head of pancreas: Secondary | ICD-10-CM

## 2014-09-01 NOTE — Progress Notes (Signed)
Weekly rad txs, pancreas  Nausea but takes zofran daily before coming here which helps, gas in abdomen, constipated,  Last bm 2 days , will try prune juice  Warmed today, stool sog=ftners not helping stated, appetite fair, energy level fatigued,  1:45 PM

## 2014-09-01 NOTE — Progress Notes (Signed)
   Department of Radiation Oncology  Phone:  [phone] Fax:        [phone]  Weekly Treatment Note    Name: [name] Date: 09/01/2014 MRN: [id number] DOB: [birth year]   Current dose: 27 Gy  Current fraction: 15   MEDICATIONS: Current Outpatient Prescriptions  Medication Sig Dispense Refill  . atorvastatin (LIPITOR) 10 MG tablet Take 1 tablet (10 mg total) by mouth daily.  90 tablet  1  . budesonide-formoterol (SYMBICORT) 160-4.5 MCG/ACT inhaler Inhale 1 puff into the lungs at bedtime.       . capecitabine (XELODA) 500 MG tablet Take 3 tablet (=1500mg ) AM; Take 3 tablet (=1500mg ) PM.  Total daily dose 3000mg .  Take on days of RADIATION ONLY.  54 tablet  0  . cetirizine (ZYRTEC) 10 MG tablet Take 10 mg by mouth daily.      Marland Kitchen docusate sodium (COLACE) 100 MG capsule Take 100 mg by mouth daily as needed for mild constipation.      . ENDOCET 5-325 MG per tablet       . hyaluronate sodium (RADIAPLEXRX) GEL Apply 1 application topically 2 (two) times daily.      . hydrochlorothiazide (HYDRODIURIL) 25 MG tablet Take 1 tablet (25 mg total) by mouth daily.  90 tablet  1  . ibuprofen (ADVIL,MOTRIN) 200 MG tablet Take 600 mg by mouth every 8 (eight) hours as needed (pain).      Marland Kitchen lisinopril (PRINIVIL,ZESTRIL) 40 MG tablet Take 1 tablet (40 mg total) by mouth daily.  90 tablet  1  . LORazepam (ATIVAN) 0.5 MG tablet TAKE 1 TABLET BY MOUTH EVERY 6 TO 8 HOURS AS NEEDED FOR SEVERE ANXIETY  30 tablet  0  . Multiple Vitamins-Minerals ([employer] SPECTRAVITE ADULT 50+) TABS Take 1 tablet by mouth daily.       . ondansetron (ZOFRAN) 8 MG tablet Take 1 tablet (8 mg total) by mouth every 8 (eight) hours as needed for nausea or vomiting.  30 tablet  1  . Probiotic Product (ALIGN) 4 MG CAPS Take 4 mg by mouth daily.        No current facility-administered medications for this encounter.     ALLERGIES: Review of patient's allergies indicates not on file.   LABORATORY DATA:  Lab Results    Component Value Date   WBC 5.0 08/28/2014   HGB 12.6 08/28/2014   HCT 38.3 08/28/2014   MCV 87.9 08/28/2014   PLT 229 08/28/2014   Lab Results  Component Value Date   NA 140 08/28/2014   K 3.4* 08/28/2014   CL 102 07/24/2014   CO2 29 08/28/2014   Lab Results  Component Value Date   ALT 88* 08/28/2014   AST 39* 08/28/2014   ALKPHOS 119 08/28/2014   BILITOT 0.55 08/28/2014     NARRATIVE: Sonya Larson was seen today for weekly treatment management. The chart was checked and the patient's films were reviewed. The patient continues to do well. She is taking nausea medication prior to treatment each day. This is working very well.  PHYSICAL EXAMINATION: vitals were not taken for this visit.     vital signs were taken. Weight is 185.2 pounds blood pressure 127/82 pulse 63 respiratory rate 20  ASSESSMENT: The patient is doing satisfactorily with treatment.  PLAN: We will continue with the patient's radiation treatment as planned.

## 2014-09-04 ENCOUNTER — Ambulatory Visit
Admission: RE | Admit: 2014-09-04 | Discharge: 2014-09-04 | Disposition: A | Discharge status: Home / Self Care | Payer: BC Managed Care – PPO | Source: Ambulatory Visit | Attending: Radiation Oncology | Admitting: Radiation Oncology

## 2014-09-04 DIAGNOSIS — Z510 Encounter for antineoplastic radiation therapy: Secondary | ICD-10-CM | POA: Diagnosis not present

## 2014-09-05 ENCOUNTER — Ambulatory Visit
Admission: RE | Admit: 2014-09-05 | Discharge: 2014-09-05 | Disposition: A | Discharge status: Home / Self Care | Payer: BC Managed Care – PPO | Source: Ambulatory Visit | Attending: Radiation Oncology | Admitting: Radiation Oncology

## 2014-09-05 DIAGNOSIS — Z510 Encounter for antineoplastic radiation therapy: Secondary | ICD-10-CM | POA: Diagnosis not present

## 2014-09-06 ENCOUNTER — Ambulatory Visit
Admission: RE | Admit: 2014-09-06 | Discharge: 2014-09-06 | Disposition: A | Discharge status: Home / Self Care | Payer: BC Managed Care – PPO | Source: Ambulatory Visit | Attending: Radiation Oncology | Admitting: Radiation Oncology

## 2014-09-06 DIAGNOSIS — Z510 Encounter for antineoplastic radiation therapy: Secondary | ICD-10-CM | POA: Diagnosis not present

## 2014-09-07 ENCOUNTER — Ambulatory Visit
Admission: RE | Admit: 2014-09-07 | Discharge: 2014-09-07 | Disposition: A | Discharge status: Home / Self Care | Payer: BC Managed Care – PPO | Source: Ambulatory Visit | Attending: Radiation Oncology | Admitting: Radiation Oncology

## 2014-09-07 ENCOUNTER — Telehealth: Payer: Self-pay | Admitting: Oncology

## 2014-09-07 ENCOUNTER — Ambulatory Visit (HOSPITAL_BASED_OUTPATIENT_CLINIC_OR_DEPARTMENT_OTHER): Payer: BC Managed Care – PPO | Admitting: Oncology

## 2014-09-07 ENCOUNTER — Other Ambulatory Visit (HOSPITAL_BASED_OUTPATIENT_CLINIC_OR_DEPARTMENT_OTHER): Payer: BC Managed Care – PPO

## 2014-09-07 VITALS — BP 122/64 | HR 63 | Temp 97.7°F | Resp 18 | Ht 63.0 in | Wt 182.5 lb

## 2014-09-07 DIAGNOSIS — C250 Malignant neoplasm of head of pancreas: Secondary | ICD-10-CM

## 2014-09-07 DIAGNOSIS — C257 Malignant neoplasm of other parts of pancreas: Secondary | ICD-10-CM

## 2014-09-07 DIAGNOSIS — I10 Essential (primary) hypertension: Secondary | ICD-10-CM

## 2014-09-07 DIAGNOSIS — Z510 Encounter for antineoplastic radiation therapy: Secondary | ICD-10-CM | POA: Diagnosis not present

## 2014-09-07 LAB — COMPREHENSIVE METABOLIC PANEL (CC13)
ALBUMIN: 3.7 g/dL (ref 3.5–5.0)
ALK PHOS: 82 U/L (ref 40–150)
ALT: 33 U/L (ref 0–55)
AST: 25 U/L (ref 5–34)
Anion Gap: 7 mEq/L (ref 3–11)
BUN: 13 mg/dL (ref 7.0–26.0)
CHLORIDE: 100 meq/L (ref 98–109)
CO2: 30 mEq/L — ABNORMAL HIGH (ref 22–29)
Calcium: 9.9 mg/dL (ref 8.4–10.4)
Creatinine: 0.8 mg/dL (ref 0.6–1.1)
Glucose: 123 mg/dl (ref 70–140)
Potassium: 3.6 mEq/L (ref 3.5–5.1)
SODIUM: 137 meq/L (ref 136–145)
Total Bilirubin: 0.65 mg/dL (ref 0.20–1.20)
Total Protein: 6.8 g/dL (ref 6.4–8.3)

## 2014-09-07 NOTE — Telephone Encounter (Signed)
gv adn printed appts ched and avs for pt fro NOV °

## 2014-09-07 NOTE — Progress Notes (Signed)
  [facility] OFFICE PROGRESS NOTE   Diagnosis: Pancreas cancer  INTERVAL HISTORY:   She returns as scheduled. She continues Xeloda and radiation. No mouth sores, diarrhea, or hand/foot pain. She developed "heartburn ". This was relieved with "TUMS ". She has mild nausea. She reports constipation. She belches frequently.  Objective:  Vital signs in last 24 hours:  Blood pressure 122/64, pulse 63, temperature 97.7 F (36.5 C), temperature source Oral, resp. rate 18, height 5\' 3"  (1.6 m), weight 182 lb 8 oz (82.781 kg), last menstrual period 10/31/2005, SpO2 97.00%.    HEENT: No thrush or ulcers Resp: Lungs clear bilaterally Cardio: Regular rate and rhythm GI: No hepatomegaly, no mass. Tender in the subxiphoid region. Vascular: No leg edema  Skin: Palms and soles without erythema or skin breakdown.     Lab Results:  Lab Results  Component Value Date   WBC 5.0 08/28/2014   HGB 12.6 08/28/2014   HCT 38.3 08/28/2014   MCV 87.9 08/28/2014   PLT 229 08/28/2014   NEUTROABS 3.7 08/28/2014    Medications: I have reviewed the patient's current medications.  Assessment/Plan: 1. Adenocarcinoma the pancreas, uncinate mass, EUS April 2015 revealed a uT3,uN1 lesion with an FNA biopsy confirming adenocarcinoma MRI abdomen 07/04/2014 confirmed a pancreas uncinate 8 mass, 11 mm portacaval lymph node, no vascular involvement, and no evidence of distant metastatic disease  PET scan 07/20/2012 with a hypermetabolic uncinate process mass and hypermetabolic periportal lymph node  Markedly elevated CA 19-9  Negative diagnostic laparoscopy 07/24/2014  Initiation of Xeloda/radiation 08/10/2014. 2. Hypertension 3. Hyperlipidemia  4. vaginal spotting summer 2015-evaluated by gynecology  5. zoster rash at June 2015  6. Asthma  7. family history of pancreas cancer  Disposition:  Ms. Fahrner appears to be tolerating the treatment well. She will continue capecitabine and radiation.  She is scheduled to complete treatment 09/20/2014. I will see her for office visit and repeat CA 19-9 level on 10/04/2014. She will contact us in the interim for new symptoms. We will refer her to Dr. Barry Dienes at the completion of treatment.  Betsy Coder, MD  09/07/2014  11:17 AM

## 2014-09-08 ENCOUNTER — Encounter: Payer: Self-pay | Admitting: Radiation Oncology

## 2014-09-08 ENCOUNTER — Ambulatory Visit
Admission: RE | Admit: 2014-09-08 | Discharge: 2014-09-08 | Disposition: A | Discharge status: Home / Self Care | Payer: BC Managed Care – PPO | Source: Ambulatory Visit | Attending: Radiation Oncology | Admitting: Radiation Oncology

## 2014-09-08 VITALS — BP 102/74 | HR 67 | Temp 98.3°F | Resp 20 | Wt 182.4 lb

## 2014-09-08 DIAGNOSIS — Z510 Encounter for antineoplastic radiation therapy: Secondary | ICD-10-CM | POA: Diagnosis not present

## 2014-09-08 DIAGNOSIS — C250 Malignant neoplasm of head of pancreas: Secondary | ICD-10-CM | POA: Diagnosis not present

## 2014-09-08 MED ORDER — RADIAPLEXRX EX GEL
Freq: Once | CUTANEOUS | Status: AC
  Administered 2014-09-08: 14:00:00 via TOPICAL

## 2014-09-08 NOTE — Progress Notes (Signed)
Weekly rad  tx pancreas  20/28 completed, c/o gas bubbling in in stomach, no nausea, no skin changes ,using radiaplex, bm's every other day,  1:29 PM

## 2014-09-08 NOTE — Progress Notes (Signed)
Department of Radiation Oncology  Phone:  [phone] Fax:        [phone]  Weekly Treatment Note    Name: Sonya Larson Date: 09/08/2014 MRN: [id number] DOB: Jul 03, 1951   Current dose: 36 Gy  Current fraction: 20   MEDICATIONS: Current Outpatient Prescriptions  Medication Sig Dispense Refill  . atorvastatin (LIPITOR) 10 MG tablet Take 1 tablet (10 mg total) by mouth daily.  90 tablet  1  . budesonide-formoterol (SYMBICORT) 160-4.5 MCG/ACT inhaler Inhale 1 puff into the lungs at bedtime.       . calcium carbonate (TUMS - DOSED IN MG ELEMENTAL CALCIUM) 500 MG chewable tablet Chew 2 tablets by mouth as needed for indigestion or heartburn.      . capecitabine (XELODA) 500 MG tablet Take 3 tablet (=1500mg ) AM; Take 3 tablet (=1500mg ) PM.  Total daily dose 3000mg .  Take on days of RADIATION ONLY.  54 tablet  0  . cetirizine (ZYRTEC) 10 MG tablet Take 10 mg by mouth daily.      Marland Kitchen docusate sodium (COLACE) 100 MG capsule Take 100 mg by mouth daily as needed for mild constipation.      . ENDOCET 5-325 MG per tablet       . hyaluronate sodium (RADIAPLEXRX) GEL Apply 1 application topically 2 (two) times daily. 2nd tube given 09/08/14      . hydrochlorothiazide (HYDRODIURIL) 25 MG tablet Take 1 tablet (25 mg total) by mouth daily.  90 tablet  1  . ibuprofen (ADVIL,MOTRIN) 200 MG tablet Take 600 mg by mouth every 8 (eight) hours as needed (pain).      Marland Kitchen lisinopril (PRINIVIL,ZESTRIL) 40 MG tablet Take 1 tablet (40 mg total) by mouth daily.  90 tablet  1  . LORazepam (ATIVAN) 0.5 MG tablet TAKE 1 TABLET BY MOUTH EVERY 6 TO 8 HOURS AS NEEDED FOR SEVERE ANXIETY  30 tablet  0  . Multiple Vitamins-Minerals ([employer] SPECTRAVITE ADULT 50+) TABS Take 1 tablet by mouth daily.       . ondansetron (ZOFRAN) 8 MG tablet Take 1 tablet (8 mg total) by mouth every 8 (eight) hours as needed for nausea or vomiting.  30 tablet  1  . simethicone (MYLICON) 416 MG chewable tablet Chew 125 mg by mouth every 6  (six) hours as needed for flatulence.       No current facility-administered medications for this encounter.     ALLERGIES: Review of patient's allergies indicates not on file.   LABORATORY DATA:  Lab Results  Component Value Date   WBC 5.0 08/28/2014   HGB 12.6 08/28/2014   HCT 38.3 08/28/2014   MCV 87.9 08/28/2014   PLT 229 08/28/2014   Lab Results  Component Value Date   NA 137 09/07/2014   K 3.6 09/07/2014   CL 102 07/24/2014   CO2 30* 09/07/2014   Lab Results  Component Value Date   ALT 33 09/07/2014   AST 25 09/07/2014   ALKPHOS 82 09/07/2014   BILITOT 0.65 09/07/2014     NARRATIVE: Sonya Larson was seen today for weekly treatment management. The chart was checked and the patient's films were reviewed. The patient states that she is doing quite well this week. No nausea. No diarrhea. No significant new issues. Some occasional stomach gas.  PHYSICAL EXAMINATION: weight is 182 lb 6.4 oz (82.736 kg). Her oral temperature is 98.3 F (36.8 C). Her blood pressure is 102/74 and her pulse is 67. Her respiration is 20.  ASSESSMENT: The patient is doing satisfactorily with treatment.  PLAN: We will continue with the patient's radiation treatment as planned.

## 2014-09-11 ENCOUNTER — Ambulatory Visit
Admission: RE | Admit: 2014-09-11 | Discharge: 2014-09-11 | Disposition: A | Discharge status: Home / Self Care | Payer: BC Managed Care – PPO | Source: Ambulatory Visit | Attending: Radiation Oncology | Admitting: Radiation Oncology

## 2014-09-11 DIAGNOSIS — Z510 Encounter for antineoplastic radiation therapy: Secondary | ICD-10-CM | POA: Diagnosis not present

## 2014-09-12 ENCOUNTER — Ambulatory Visit
Admission: RE | Admit: 2014-09-12 | Discharge: 2014-09-12 | Disposition: A | Discharge status: Home / Self Care | Payer: BC Managed Care – PPO | Source: Ambulatory Visit | Attending: Radiation Oncology | Admitting: Radiation Oncology

## 2014-09-12 DIAGNOSIS — Z510 Encounter for antineoplastic radiation therapy: Secondary | ICD-10-CM | POA: Diagnosis not present

## 2014-09-13 ENCOUNTER — Ambulatory Visit
Admission: RE | Admit: 2014-09-13 | Discharge: 2014-09-13 | Disposition: A | Discharge status: Home / Self Care | Payer: BC Managed Care – PPO | Source: Ambulatory Visit | Attending: Radiation Oncology | Admitting: Radiation Oncology

## 2014-09-13 DIAGNOSIS — Z510 Encounter for antineoplastic radiation therapy: Secondary | ICD-10-CM | POA: Diagnosis not present

## 2014-09-14 ENCOUNTER — Ambulatory Visit
Admission: RE | Admit: 2014-09-14 | Discharge: 2014-09-14 | Disposition: A | Discharge status: Home / Self Care | Payer: BC Managed Care – PPO | Source: Ambulatory Visit | Attending: Radiation Oncology | Admitting: Radiation Oncology

## 2014-09-14 DIAGNOSIS — Z510 Encounter for antineoplastic radiation therapy: Secondary | ICD-10-CM | POA: Diagnosis not present

## 2014-09-15 ENCOUNTER — Encounter: Payer: Self-pay | Admitting: Radiation Oncology

## 2014-09-15 ENCOUNTER — Other Ambulatory Visit: Payer: Self-pay

## 2014-09-15 ENCOUNTER — Ambulatory Visit
Admission: RE | Admit: 2014-09-15 | Discharge: 2014-09-15 | Disposition: A | Discharge status: Home / Self Care | Payer: BC Managed Care – PPO | Source: Ambulatory Visit | Attending: Radiation Oncology | Admitting: Radiation Oncology

## 2014-09-15 ENCOUNTER — Other Ambulatory Visit: Payer: Self-pay | Admitting: Family Medicine

## 2014-09-15 VITALS — BP 108/61 | HR 60 | Temp 98.1°F | Resp 20 | Wt 180.1 lb

## 2014-09-15 DIAGNOSIS — C250 Malignant neoplasm of head of pancreas: Secondary | ICD-10-CM

## 2014-09-15 DIAGNOSIS — Z510 Encounter for antineoplastic radiation therapy: Secondary | ICD-10-CM | POA: Diagnosis not present

## 2014-09-15 NOTE — Progress Notes (Signed)
Department of Radiation Oncology  Phone:  [phone] Fax:        [phone]  Weekly Treatment Note    Name: Sonya Larson Date: 09/15/2014 MRN: [id number] DOB: [birth year]   Current dose: 45 Gy  Current fraction: 25   MEDICATIONS: Current Outpatient Prescriptions  Medication Sig Dispense Refill  . atorvastatin (LIPITOR) 10 MG tablet Take 1 tablet (10 mg total) by mouth daily.  90 tablet  1  . budesonide-formoterol (SYMBICORT) 160-4.5 MCG/ACT inhaler Inhale 1 puff into the lungs at bedtime.       . calcium carbonate (TUMS - DOSED IN MG ELEMENTAL CALCIUM) 500 MG chewable tablet Chew 2 tablets by mouth as needed for indigestion or heartburn.      . capecitabine (XELODA) 500 MG tablet Take 3 tablet (=1500mg ) AM; Take 3 tablet (=1500mg ) PM.  Total daily dose 3000mg .  Take on days of RADIATION ONLY.  54 tablet  0  . cetirizine (ZYRTEC) 10 MG tablet Take 10 mg by mouth daily.      Marland Kitchen docusate sodium (COLACE) 100 MG capsule Take 100 mg by mouth daily as needed for mild constipation.      . ENDOCET 5-325 MG per tablet       . hyaluronate sodium (RADIAPLEXRX) GEL Apply 1 application topically 2 (two) times daily. 2nd tube given 09/08/14      . hydrochlorothiazide (HYDRODIURIL) 25 MG tablet Take 1 tablet (25 mg total) by mouth daily.  90 tablet  1  . ibuprofen (ADVIL,MOTRIN) 200 MG tablet Take 600 mg by mouth every 8 (eight) hours as needed (pain).      Marland Kitchen lisinopril (PRINIVIL,ZESTRIL) 40 MG tablet Take 1 tablet (40 mg total) by mouth daily.  90 tablet  1  . LORazepam (ATIVAN) 0.5 MG tablet TAKE 1 TABLET BY MOUTH EVERY 6 TO 8 HOURS AS NEEDED FOR SEVERE ANXIETY  30 tablet  0  . Multiple Vitamins-Minerals ([employer] SPECTRAVITE ADULT 50+) TABS Take 1 tablet by mouth daily.       . ondansetron (ZOFRAN) 8 MG tablet Take 1 tablet (8 mg total) by mouth every 8 (eight) hours as needed for nausea or vomiting.  30 tablet  1  . simethicone (MYLICON) 676 MG chewable tablet Chew 125 mg by mouth every 6  (six) hours as needed for flatulence.       No current facility-administered medications for this encounter.     ALLERGIES: Review of patient's allergies indicates not on file.   LABORATORY DATA:  Lab Results  Component Value Date   WBC 5.0 08/28/2014   HGB 12.6 08/28/2014   HCT 38.3 08/28/2014   MCV 87.9 08/28/2014   PLT 229 08/28/2014   Lab Results  Component Value Date   NA 137 09/07/2014   K 3.6 09/07/2014   CL 102 07/24/2014   CO2 30* 09/07/2014   Lab Results  Component Value Date   ALT 33 09/07/2014   AST 25 09/07/2014   ALKPHOS 82 09/07/2014   BILITOT 0.65 09/07/2014     NARRATIVE: Sonya Larson was seen today for weekly treatment management. The chart was checked and the patient's films were reviewed. The patient is doing fairly well this week. Poor appetite. Nausea is controlled with medication. Energy down. Overall things have been manageable.  PHYSICAL EXAMINATION: weight is 180 lb 1.6 oz (81.693 kg). Her oral temperature is 98.1 F (36.7 C). Her blood pressure is 108/61 and her pulse is 60. Her respiration is 20.  ASSESSMENT: The patient is doing satisfactorily with treatment.  PLAN: We will continue with the patient's radiation treatment as planned.

## 2014-09-15 NOTE — Progress Notes (Signed)
Weekly rad rxs pancreas, 25/28 completed, stil has nausea takes nasusea med 1 hour before treatment and that helps, poor appetite,  Energy level down also, no pain, did have loose stools on Monday and Tuesday, WEd., back to constipation, no c/o's stated,"It's manageable" 1:35 PM

## 2014-09-18 ENCOUNTER — Ambulatory Visit
Admission: RE | Admit: 2014-09-18 | Discharge: 2014-09-18 | Disposition: A | Discharge status: Home / Self Care | Payer: BC Managed Care – PPO | Source: Ambulatory Visit | Attending: Radiation Oncology | Admitting: Radiation Oncology

## 2014-09-18 DIAGNOSIS — Z510 Encounter for antineoplastic radiation therapy: Secondary | ICD-10-CM | POA: Diagnosis not present

## 2014-09-19 ENCOUNTER — Ambulatory Visit: Payer: BC Managed Care – PPO

## 2014-09-20 ENCOUNTER — Ambulatory Visit: Payer: BC Managed Care – PPO

## 2014-09-20 ENCOUNTER — Ambulatory Visit
Admission: RE | Admit: 2014-09-20 | Discharge: 2014-09-20 | Disposition: A | Discharge status: Home / Self Care | Payer: BC Managed Care – PPO | Source: Ambulatory Visit | Attending: Radiation Oncology | Admitting: Radiation Oncology

## 2014-09-20 ENCOUNTER — Encounter: Payer: Self-pay | Admitting: Radiation Oncology

## 2014-09-20 VITALS — BP 119/74 | HR 66 | Temp 98.2°F | Resp 20 | Ht 63.0 in | Wt 178.0 lb

## 2014-09-20 DIAGNOSIS — C250 Malignant neoplasm of head of pancreas: Secondary | ICD-10-CM

## 2014-09-20 DIAGNOSIS — Z510 Encounter for antineoplastic radiation therapy: Secondary | ICD-10-CM | POA: Diagnosis not present

## 2014-09-20 NOTE — Progress Notes (Signed)
Sonya Larson has completed 27/28 fractions to her pancreas.  She denies pain but has had stomach pains in her upper abdomen on and off for the past week.  She reports having nausea and does not want to eat or drink anything.  She has lost 2 lbs since last week.  She is taking zofran and ativan.  She is also taking xeloda.  She reports constipation and fatigue.  She has an appointment with Dr. Barry Dienes on Monday.  She has been given a one month follow up appointment for Dr. Lisbeth Renshaw.

## 2014-09-20 NOTE — Progress Notes (Signed)
Department of Radiation Oncology  Phone:  [phone] Fax:        [phone]  Weekly Treatment Note    Name: Sonya Larson Date: 09/20/2014 MRN: [id number] DOB: 07-20-1951   Current dose: 48.6 Gy  Current fraction: 27   MEDICATIONS: Current Outpatient Prescriptions  Medication Sig Dispense Refill  . atorvastatin (LIPITOR) 10 MG tablet TAKE 1 TABLET BY MOUTH EVERY DAY  90 tablet  1  . calcium carbonate (TUMS - DOSED IN MG ELEMENTAL CALCIUM) 500 MG chewable tablet Chew 2 tablets by mouth as needed for indigestion or heartburn.      . capecitabine (XELODA) 500 MG tablet Take 3 tablet (=1500mg ) AM; Take 3 tablet (=1500mg ) PM.  Total daily dose 3000mg .  Take on days of RADIATION ONLY.  54 tablet  0  . cetirizine (ZYRTEC) 10 MG tablet Take 10 mg by mouth daily.      Marland Kitchen docusate sodium (COLACE) 100 MG capsule Take 100 mg by mouth daily as needed for mild constipation.      . hydrochlorothiazide (HYDRODIURIL) 25 MG tablet TAKE 1 TABLET BY MOUTH EVERY DAY  90 tablet  1  . ibuprofen (ADVIL,MOTRIN) 200 MG tablet Take 600 mg by mouth every 8 (eight) hours as needed (pain).      Marland Kitchen lisinopril (PRINIVIL,ZESTRIL) 40 MG tablet TAKE 1 TABLET BY MOUTH EVERY DAY  90 tablet  1  . LORazepam (ATIVAN) 0.5 MG tablet TAKE 1 TABLET BY MOUTH EVERY 6 TO 8 HOURS AS NEEDED FOR SEVERE ANXIETY  30 tablet  0  . ondansetron (ZOFRAN) 8 MG tablet Take 1 tablet (8 mg total) by mouth every 8 (eight) hours as needed for nausea or vomiting.  30 tablet  1  . simethicone (MYLICON) 097 MG chewable tablet Chew 125 mg by mouth every 6 (six) hours as needed for flatulence.      . budesonide-formoterol (SYMBICORT) 160-4.5 MCG/ACT inhaler Inhale 1 puff into the lungs at bedtime.       . ENDOCET 5-325 MG per tablet       . hyaluronate sodium (RADIAPLEXRX) GEL Apply 1 application topically 2 (two) times daily. 2nd tube given 09/08/14      . Multiple Vitamins-Minerals ([employer] SPECTRAVITE ADULT 50+) TABS Take 1 tablet by mouth  daily.        No current facility-administered medications for this encounter.     ALLERGIES: Review of patient's allergies indicates not on file.   LABORATORY DATA:  Lab Results  Component Value Date   WBC 5.0 08/28/2014   HGB 12.6 08/28/2014   HCT 38.3 08/28/2014   MCV 87.9 08/28/2014   PLT 229 08/28/2014   Lab Results  Component Value Date   NA 137 09/07/2014   K 3.6 09/07/2014   CL 102 07/24/2014   CO2 30* 09/07/2014   Lab Results  Component Value Date   ALT 33 09/07/2014   AST 25 09/07/2014   ALKPHOS 82 09/07/2014   BILITOT 0.65 09/07/2014     NARRATIVE: Sonya Larson was seen today for weekly treatment management. The chart was checked and the patient's films were reviewed. The patient has one more fraction remaining. She has had increased nausea this past week as well as some aching pain in the abdomen. She has been taking Advil for this. Strong her pain medicine has caused increased nausea.  PHYSICAL EXAMINATION: height is 5\' 3"  (1.6 m) and weight is 178 lb (80.74 kg). Her oral temperature is 98.2 F (36.8 C). Her  blood pressure is 119/74 and her pulse is 66. Her respiration is 20.        ASSESSMENT: The patient is doing satisfactorily with treatment.  PLAN: We will continue with the patient's radiation treatment as planned. The patient has had a little more trouble this past week but overall has done very well. We will see her back one month after completing radiation treatment.

## 2014-09-21 ENCOUNTER — Encounter: Payer: BC Managed Care – PPO | Admitting: Radiation Oncology

## 2014-09-21 ENCOUNTER — Ambulatory Visit: Payer: BC Managed Care – PPO

## 2014-09-21 ENCOUNTER — Encounter: Payer: Self-pay | Admitting: Radiation Oncology

## 2014-09-21 ENCOUNTER — Ambulatory Visit
Admission: RE | Admit: 2014-09-21 | Discharge: 2014-09-21 | Disposition: A | Discharge status: Home / Self Care | Payer: BC Managed Care – PPO | Source: Ambulatory Visit | Attending: Radiation Oncology | Admitting: Radiation Oncology

## 2014-09-21 DIAGNOSIS — Z510 Encounter for antineoplastic radiation therapy: Secondary | ICD-10-CM | POA: Diagnosis not present

## 2014-09-25 ENCOUNTER — Other Ambulatory Visit: Payer: Self-pay | Admitting: Family Medicine

## 2014-09-25 ENCOUNTER — Other Ambulatory Visit (INDEPENDENT_AMBULATORY_CARE_PROVIDER_SITE_OTHER): Payer: Self-pay | Admitting: *Deleted

## 2014-09-25 DIAGNOSIS — C250 Malignant neoplasm of head of pancreas: Secondary | ICD-10-CM

## 2014-09-25 NOTE — Telephone Encounter (Signed)
Last visit 06/19/14 Last refill 08/21/14 #30 0 refill

## 2014-09-25 NOTE — Telephone Encounter (Signed)
Refill OK

## 2014-09-28 ENCOUNTER — Telehealth: Payer: Self-pay | Admitting: *Deleted

## 2014-09-28 NOTE — Telephone Encounter (Signed)
Call from pt asking if contrast for CT will alter her lab results. She is scheduled for office visit day after scan. No, follow up as scheduled. Labs should not be affected by oral contrast. She voiced understanding.

## 2014-09-29 ENCOUNTER — Telehealth: Payer: Self-pay | Admitting: *Deleted

## 2014-09-29 NOTE — Telephone Encounter (Signed)
Call from [facility] with [place] to confirm pt has completed Xeloda therapy. YES. They will close acct.

## 2014-10-03 ENCOUNTER — Ambulatory Visit
Admission: RE | Admit: 2014-10-03 | Discharge: 2014-10-03 | Disposition: A | Discharge status: Home / Self Care | Payer: Medicare Other | Source: Ambulatory Visit | Attending: General Surgery | Admitting: General Surgery

## 2014-10-03 MED ORDER — IOHEXOL 350 MG/ML SOLN: 100.0000 mL | Freq: Once | INTRAVENOUS | Status: AC | PRN

## 2014-10-04 ENCOUNTER — Ambulatory Visit: Payer: Medicare Other | Admitting: Nutrition

## 2014-10-04 ENCOUNTER — Ambulatory Visit (HOSPITAL_BASED_OUTPATIENT_CLINIC_OR_DEPARTMENT_OTHER): Payer: Medicare Other | Admitting: Oncology

## 2014-10-04 ENCOUNTER — Other Ambulatory Visit (HOSPITAL_BASED_OUTPATIENT_CLINIC_OR_DEPARTMENT_OTHER): Payer: Medicare Other

## 2014-10-04 ENCOUNTER — Encounter: Payer: Self-pay | Admitting: *Deleted

## 2014-10-04 ENCOUNTER — Telehealth: Payer: Self-pay | Admitting: Oncology

## 2014-10-04 ENCOUNTER — Other Ambulatory Visit: Payer: BC Managed Care – PPO

## 2014-10-04 VITALS — BP 120/78 | HR 92 | Temp 98.4°F | Resp 18 | Ht 63.0 in | Wt 172.9 lb

## 2014-10-04 DIAGNOSIS — Z800 Family history of malignant neoplasm of digestive organs: Secondary | ICD-10-CM

## 2014-10-04 DIAGNOSIS — C250 Malignant neoplasm of head of pancreas: Secondary | ICD-10-CM

## 2014-10-04 DIAGNOSIS — C259 Malignant neoplasm of pancreas, unspecified: Secondary | ICD-10-CM

## 2014-10-04 DIAGNOSIS — I10 Essential (primary) hypertension: Secondary | ICD-10-CM

## 2014-10-04 DIAGNOSIS — E785 Hyperlipidemia, unspecified: Secondary | ICD-10-CM

## 2014-10-04 DIAGNOSIS — R978 Other abnormal tumor markers: Secondary | ICD-10-CM

## 2014-10-04 LAB — COMPREHENSIVE METABOLIC PANEL (CC13)
ALBUMIN: 3.8 g/dL (ref 3.5–5.0)
ALK PHOS: 79 U/L (ref 40–150)
ALT: 38 U/L (ref 0–55)
AST: 32 U/L (ref 5–34)
Anion Gap: 11 mEq/L (ref 3–11)
BUN: 12.1 mg/dL (ref 7.0–26.0)
CO2: 25 mEq/L (ref 22–29)
CREATININE: 0.9 mg/dL (ref 0.6–1.1)
Calcium: 9.9 mg/dL (ref 8.4–10.4)
Chloride: 104 mEq/L (ref 98–109)
GLUCOSE: 121 mg/dL (ref 70–140)
Potassium: 3.2 mEq/L — ABNORMAL LOW (ref 3.5–5.1)
Sodium: 140 mEq/L (ref 136–145)
Total Bilirubin: 0.8 mg/dL (ref 0.20–1.20)
Total Protein: 6.7 g/dL (ref 6.4–8.3)

## 2014-10-04 LAB — CBC WITH DIFFERENTIAL/PLATELET
BASO%: 0.5 % (ref 0.0–2.0)
Basophils Absolute: 0 10*3/uL (ref 0.0–0.1)
EOS ABS: 0.2 10*3/uL (ref 0.0–0.5)
EOS%: 5.7 % (ref 0.0–7.0)
HCT: 35.5 % (ref 34.8–46.6)
HGB: 12.5 g/dL (ref 11.6–15.9)
LYMPH#: 0.2 10*3/uL — AB (ref 0.9–3.3)
LYMPH%: 4.7 % — AB (ref 14.0–49.7)
MCH: 31.5 pg (ref 25.1–34.0)
MCHC: 35.2 g/dL (ref 31.5–36.0)
MCV: 89.4 fL (ref 79.5–101.0)
MONO#: 0.6 10*3/uL (ref 0.1–0.9)
MONO%: 13.9 % (ref 0.0–14.0)
NEUT%: 75.2 % (ref 38.4–76.8)
NEUTROS ABS: 3 10*3/uL (ref 1.5–6.5)
NRBC: 0 % (ref 0–0)
PLATELETS: 190 10*3/uL (ref 145–400)
RBC: 3.97 10*6/uL (ref 3.70–5.45)
RDW: 17.7 % — ABNORMAL HIGH (ref 11.2–14.5)
WBC: 4 10*3/uL (ref 3.9–10.3)

## 2014-10-04 LAB — CANCER ANTIGEN 19-9: CA 19 9: 3182.6 U/mL — AB (ref <35.0)

## 2014-10-04 NOTE — Progress Notes (Signed)
[place] OFFICE PROGRESS NOTE   Diagnosis: pancreas cancer  INTERVAL HISTORY:   She returns as scheduled. She completed radiation and capecitabine on 09/21/2014.she reports her appetite is improving. She saw Dr. Barry Dienes and is scheduled for surgery 11/02/2014. No abdominal pain.  Objective:  Vital signs in last 24 hours:  Blood pressure 120/78, pulse 92, temperature 98.4 F (36.9 C), temperature source Oral, resp. rate 18, height 5\' 3"  (1.6 m), weight 172 lb 14.4 oz (78.427 kg), last menstrual period 10/31/2005, SpO2 98 %.    HEENT: no thrush or ulcers Lymphatics: no cervical or supraclavicular nodes Resp: lungs clear bilaterally Cardio: regular rate and rhythm GI: no hepatomegaly, nontender, no mass Vascular: no leg edema  Skin:palms without erythema     Lab Results:  Lab Results  Component Value Date   WBC 4.0 10/04/2014   HGB 12.5 10/04/2014   HCT 35.5 10/04/2014   MCV 89.4 10/04/2014   PLT 190 10/04/2014   NEUTROABS 3.0 10/04/2014      Imaging:  Ct Abd Wo & W Cm  10/03/2014   CLINICAL DATA:  Adenocarcinoma the pancreas diagnosed 7/15. Treated by radiation therapy and chemotherapy. Chemotherapy completed 12 days ago. Constipation.Adenocarcinoma of head of pancreas C25.0 (ICD-10-CM)  EXAM: CT ABDOMEN WITHOUT AND WITH CONTRAST  TECHNIQUE: Multidetector CT imaging of the abdomen was performed following the standard protocol before and following the bolus administration of intravenous contrast.  CONTRAST:  100 cc Omnipaque 350  COMPARISON:  07/20/2014 PET.  07/04/2014 MRI.  FINDINGS: Lower chest: Clear lung bases. Normal heart size, without pericardial or pleural effusion.  Hepatobiliary: Mild hepatic steatosis. More focal steatosis adjacent the gallbladder and falciform ligament. Normal gallbladder, without biliary ductal dilatation.  Pancreas: Mild pancreatic dilatation within the head and neck, similar. Hypo attenuating mass within the pancreatic  uncinate process measures 2.4 x 2.0 cm on image 51 of series 4. Compare 3.8 x 2.5 cm on image 45 of series 12 of the 07/04/2014 MRI. Abnormal soft tissue extends superiorly and to the left, surrounding the superior mesenteric artery about its posterior 180 degrees. Example image 22 of series 3. Felt to be similar to image 35 of series 12 of the 07/04/2014 MRI. Celiac, splenic vein, portal vein, and superior mesenteric vein uninvolved by tumor. No evidence of acute pancreatitis.  Spleen: Normal  Adrenals/Urinary Tract: Normal adrenal glands. Normal kidneys, without hydronephrosis or hydroureter.  Stomach/Bowel: Normal stomach, without wall thickening. Normal large and small bowel loops.  Vascular/Lymphatic: Aortic and branch vessel atherosclerosis. Suspicion of superior mesenteric artery involvement, as detailed above. Accessory lower pole right renal artery. Portacaval node measures 1.0 cm on image 21 of series 3 and is unchanged.  Other:  No ascites.  No evidence of omental or peritoneal disease.  Musculoskeletal: No focal osseous abnormality.  IMPRESSION: 1. Decreased size of an uncinate process pancreatic mass. 2. Similar size of a portal caval node, presumed malignant based on hypermetabolism on prior PET. 3. Soft tissue density extending off the cephalad aspect of the uncinate process mass. This contacts the superior mesenteric artery over an approximately 180 degree span, suspicious for vascular involvement. This appearance is felt to be similar to on the prior. 4. Hepatic steatosis.   Electronically Signed   By: Abigail Miyamoto M.D.   On: 10/03/2014 10:45    Medications: I have reviewed the patient's current medications.  Assessment/Plan: 1. Adenocarcinoma the pancreas, uncinate mass, EUS April 2015 revealed a uT3,uN1 lesion with an FNA biopsy confirming adenocarcinoma  MRI abdomen 07/04/2014 confirmed a pancreas uncinate 8 mass, 11 mm portacaval lymph node, no vascular involvement, and no evidence of  distant metastatic disease   PET scan 07/20/2012 with a hypermetabolic uncinate process mass and hypermetabolic periportal lymph node   Markedly elevated CA 19-9   Negative diagnostic laparoscopy 07/24/2014   Initiation of Xeloda/radiation 08/10/2014.completed 09/21/2014.  CT 10/03/2014 confirmed a decrease in the pancreas mass, stable portacaval lymph node, and apparent involvement of the superior mesenteric artery 2. Hypertension 3. Hyperlipidemia  4. vaginal spotting summer 2015-evaluated by gynecology  5. zoster rash at June 2015  6. Asthma  7. family history of pancreas cancer   Disposition:  She has completed neoadjuvant capecitabine and radiation. The restaging CT confirms a decrease in the pancreas mass, but there may be involvement of the superior mesenteric artery. She is scheduled for surgery 11/02/2014. We will followup on the CA19-9 from today. I will present her case at the GI tumor conference 10/11/2014 to get Dr. Marlowe Aschoff opinion regarding resectability.  She will return for an office visit during the week of 11/27/2014. We will see her sooner pending the GI tumor conference recommendation.  Betsy Coder, MD  10/04/2014  10:49 AM

## 2014-10-04 NOTE — Telephone Encounter (Signed)
Gave avs & cal for Dec. °

## 2014-10-04 NOTE — Progress Notes (Signed)
Email to [facility] Pathology department/Kim Sharlett Iles to add Caisley to GI Conference on 11/11-RADIOLOGY only.

## 2014-10-04 NOTE — Progress Notes (Signed)
Nutrition followup completed with patient and husband.  Patient has completed radiation and chemotherapy for cancer of the pancreas.  Surgery has been scheduled for December 3, for patient to undergo Whipple.  Patient has had continued weight loss with current weight documented as 173 pounds today down from 210 pounds February 2015.  Patient reports nausea has improved.  She no longer takes nausea medication.  She is beginning to feel hungry.  She still consume small amounts of food but feels overall she is increasing her oral intake.  Constipation continues to be an issue, and she is taking stool softeners.  Nutrition diagnosis: Unintended weight loss continues.  Intervention: Patient educated on adding calories and protein between meals and snacks 3 times a day.   Provided patient with an updated protein list and provided examples of how to incorporate protein at snack time. Educated patient on strategies for improving constipation.  Recommended gradual increase of fiber-containing foods along with  increased water. Educated patient about nutrition trial using Impact AR.  Provided written information. Questions were answered and teach back method used.  Monitoring, evaluation, goals: Patient will tolerate increased calories and protein to minimize further weight loss.  Next visit: Tuesday, November 24.  **Disclaimer: This note was dictated with voice recognition software. Similar sounding words can inadvertently be transcribed and this note may contain transcription errors which may not have been corrected upon publication of note.**

## 2014-10-05 ENCOUNTER — Telehealth: Payer: Self-pay | Admitting: *Deleted

## 2014-10-05 DIAGNOSIS — C250 Malignant neoplasm of head of pancreas: Secondary | ICD-10-CM

## 2014-10-05 MED ORDER — POTASSIUM CHLORIDE CRYS ER 20 MEQ PO TBCR: 20.0000 meq | EXTENDED_RELEASE_TABLET | Freq: Two times a day (BID) | ORAL | Status: DC

## 2014-10-05 NOTE — Telephone Encounter (Addendum)
Made patient aware of lab results and to start KCl 20 meq daily. She understands and agrees. Having labs on 10/24/14 at [facility] for preop workup-will attempt to have Bmet added to these labs. Orders placed in EPIC as requested-Short Stay would not accept verbal or written order.

## 2014-10-05 NOTE — Addendum Note (Signed)
Addended by: Tania Ade on: 10/05/2014 02:42 PM   Modules accepted: Orders

## 2014-10-05 NOTE — Telephone Encounter (Signed)
Left VM for patient to call regarding lab results. Can schedule repeat labs on 11/24 when here to see Ernestene Kiel if she prefers.

## 2014-10-05 NOTE — Telephone Encounter (Signed)
-----   Message from Ladell Pier, MD sent at 10/04/2014  6:44 PM EST ----- Please call patient, K+ is low start kcl 43meq daily, ca19-9 is better Repeat bmet 2 weeks

## 2014-10-13 ENCOUNTER — Other Ambulatory Visit (INDEPENDENT_AMBULATORY_CARE_PROVIDER_SITE_OTHER): Payer: Self-pay

## 2014-10-13 ENCOUNTER — Telehealth (INDEPENDENT_AMBULATORY_CARE_PROVIDER_SITE_OTHER): Payer: Self-pay

## 2014-10-13 DIAGNOSIS — C259 Malignant neoplasm of pancreas, unspecified: Secondary | ICD-10-CM

## 2014-10-13 NOTE — Telephone Encounter (Signed)
Per Dr Marlowe Aschoff request ref back to Dr Paulita Fujita for EUS in epic and sent to ref coord to set up asap. Hx pancreatic cancer.

## 2014-10-14 NOTE — Progress Notes (Signed)
  Radiation Oncology         [phone] ________________________________  Name: Sonya Larson MRN: [id number]  Date: 09/21/2014  DOB: 08/27/1951  End of Treatment Note  Diagnosis:   Adenocarcinoma of the pancreas     Indication for treatment:  curative       Radiation treatment dates:   08/10/2014 through 09/21/2014  Site/dose:   The patient was treated to the gross tumor and surrounding high-risk region. The patient received a IMRT technique with daily image guidance and concurrent chemotherapy. The patient received 50.4 gray in 28 fractions.  Narrative: The patient tolerated radiation treatment relatively well.   Expected issues such as nausea and fatigue were managed during treatment. Overall the patient did quite well.  Plan: The patient has completed radiation treatment. The patient will return to radiation oncology clinic for routine followup in one month. I advised the patient to call or return sooner if they have any questions or concerns related to their recovery or treatment. ________________________________  Jodelle Gross, M.D., Ph.D.

## 2014-10-17 NOTE — Telephone Encounter (Signed)
The patient's most recent CT makes it appear that her tumor is not able to be removed.  We need a repeat ultrasound to evaluate the vessels near the mass.

## 2014-10-18 ENCOUNTER — Telehealth (INDEPENDENT_AMBULATORY_CARE_PROVIDER_SITE_OTHER): Payer: Self-pay | Admitting: General Surgery

## 2014-10-18 ENCOUNTER — Encounter (HOSPITAL_COMMUNITY): Payer: Self-pay | Admitting: *Deleted

## 2014-10-18 NOTE — Telephone Encounter (Signed)
Discussed the concerning findings on CT scan of possible arterial involvement.  Reviewed that endoscopic ultrasound will help [place] see if the artery looks involved.  This is scheduled for Wednesday of next week (november 25).

## 2014-10-20 ENCOUNTER — Encounter: Payer: Self-pay | Admitting: Radiation Oncology

## 2014-10-21 ENCOUNTER — Other Ambulatory Visit (INDEPENDENT_AMBULATORY_CARE_PROVIDER_SITE_OTHER): Payer: Self-pay | Admitting: General Surgery

## 2014-10-24 ENCOUNTER — Other Ambulatory Visit: Payer: Self-pay | Admitting: Gastroenterology

## 2014-10-24 ENCOUNTER — Encounter (HOSPITAL_COMMUNITY): Payer: Self-pay

## 2014-10-24 ENCOUNTER — Encounter (HOSPITAL_COMMUNITY)
Admission: RE | Admit: 2014-10-24 | Discharge: 2014-10-24 | Disposition: A | Discharge status: Home / Self Care | Payer: Medicare Other | Source: Ambulatory Visit | Attending: General Surgery | Admitting: General Surgery

## 2014-10-24 ENCOUNTER — Ambulatory Visit: Payer: Medicare Other | Admitting: Nutrition

## 2014-10-24 DIAGNOSIS — C259 Malignant neoplasm of pancreas, unspecified: Secondary | ICD-10-CM | POA: Diagnosis present

## 2014-10-24 DIAGNOSIS — Z87891 Personal history of nicotine dependence: Secondary | ICD-10-CM | POA: Diagnosis not present

## 2014-10-24 DIAGNOSIS — K269 Duodenal ulcer, unspecified as acute or chronic, without hemorrhage or perforation: Secondary | ICD-10-CM | POA: Diagnosis not present

## 2014-10-24 DIAGNOSIS — K219 Gastro-esophageal reflux disease without esophagitis: Secondary | ICD-10-CM | POA: Diagnosis not present

## 2014-10-24 DIAGNOSIS — I10 Essential (primary) hypertension: Secondary | ICD-10-CM | POA: Diagnosis not present

## 2014-10-24 HISTORY — DX: Gastro-esophageal reflux disease without esophagitis: K21.9

## 2014-10-24 LAB — PREPARE RBC (CROSSMATCH)

## 2014-10-24 LAB — URINALYSIS, ROUTINE W REFLEX MICROSCOPIC
GLUCOSE, UA: NEGATIVE mg/dL
Hgb urine dipstick: NEGATIVE
Ketones, ur: NEGATIVE mg/dL
NITRITE: NEGATIVE
PH: 5.5 (ref 5.0–8.0)
Protein, ur: NEGATIVE mg/dL
Specific Gravity, Urine: 1.023 (ref 1.005–1.030)
Urobilinogen, UA: 0.2 mg/dL (ref 0.0–1.0)

## 2014-10-24 LAB — URINE MICROSCOPIC-ADD ON

## 2014-10-24 LAB — COMPREHENSIVE METABOLIC PANEL
ALK PHOS: 91 U/L (ref 39–117)
ALT: 25 U/L (ref 0–35)
AST: 23 U/L (ref 0–37)
Albumin: 4.1 g/dL (ref 3.5–5.2)
Anion gap: 15 (ref 5–15)
BUN: 12 mg/dL (ref 6–23)
CO2: 24 mEq/L (ref 19–32)
Calcium: 10.1 mg/dL (ref 8.4–10.5)
Chloride: 103 mEq/L (ref 96–112)
Creatinine, Ser: 0.74 mg/dL (ref 0.50–1.10)
GFR calc Af Amer: >90 mL/min (ref >90)
GFR calc non Af Amer: 87 mL/min — ABNORMAL LOW (ref >90)
Glucose, Bld: 113 mg/dL — ABNORMAL HIGH (ref 70–99)
POTASSIUM: 4.1 meq/L (ref 3.7–5.3)
Sodium: 142 mEq/L (ref 137–147)
Total Bilirubin: 0.5 mg/dL (ref 0.3–1.2)
Total Protein: 7.2 g/dL (ref 6.0–8.3)

## 2014-10-24 LAB — CBC WITH DIFFERENTIAL/PLATELET
BASOS PCT: 1 % (ref 0–1)
Basophils Absolute: 0 10*3/uL (ref 0.0–0.1)
Eosinophils Absolute: 0.1 10*3/uL (ref 0.0–0.7)
Eosinophils Relative: 3 % (ref 0–5)
HCT: 38.2 % (ref 36.0–46.0)
Hemoglobin: 12.8 g/dL (ref 12.0–15.0)
Lymphocytes Relative: 8 % — ABNORMAL LOW (ref 12–46)
Lymphs Abs: 0.4 10*3/uL — ABNORMAL LOW (ref 0.7–4.0)
MCH: 31.7 pg (ref 26.0–34.0)
MCHC: 33.5 g/dL (ref 30.0–36.0)
MCV: 94.6 fL (ref 78.0–100.0)
MONOS PCT: 9 % (ref 3–12)
Monocytes Absolute: 0.4 10*3/uL (ref 0.1–1.0)
Neutro Abs: 3.4 10*3/uL (ref 1.7–7.7)
Neutrophils Relative %: 79 % — ABNORMAL HIGH (ref 43–77)
PLATELETS: 227 10*3/uL (ref 150–400)
RBC: 4.04 MIL/uL (ref 3.87–5.11)
RDW: 15.1 % (ref 11.5–15.5)
WBC: 4.3 10*3/uL (ref 4.0–10.5)

## 2014-10-24 LAB — PROTIME-INR
INR: 1.08 (ref 0.00–1.49)
Prothrombin Time: 14.1 seconds (ref 11.6–15.2)

## 2014-10-24 LAB — AMYLASE: Amylase: 70 U/L (ref 0–105)

## 2014-10-24 LAB — ABO/RH: ABO/RH(D): O POS

## 2014-10-24 LAB — HEMOGLOBIN A1C
HEMOGLOBIN A1C: 5.5 % (ref <5.7)
Mean Plasma Glucose: 111 mg/dL (ref <117)

## 2014-10-24 NOTE — Pre-Procedure Instructions (Signed)
LEVORA WERDEN  10/24/2014   Your procedure is scheduled on:  Thursday, November 02, 2014  Report to [facility] Admitting at 5:30 AM.  Call this number if you have problems the morning of surgery: [phone]   Remember: Fleets enema night before surgery   Do not eat food or drink liquids after midnight Wednesday, November 01, 2014   Take these medicines the morning of surgery with A SIP OF WATER: if needed:cetirizine (ZYRTEC) for allergies, LORazepam (ATIVAN) for severe anxiety, ondansetron (ZOFRAN) for nausea or vomiting, budesonide-formoterol (SYMBICORT) inhaler for asthma symptoms ( bring inhaler with you on day of procedure).  Stop taking Aspirin, vitamins, and herbal medications. Do not take any NSAIDs ie: Ibuprofen, Advil, Naproxen or any medication containing Aspirin; stop 5 days prior to procedure (Saturday, October 28, 2014).  Do not wear jewelry, make-up or nail polish.  Do not wear lotions, powders, or perfumes. You may not wear deodorant.  Do not shave 48 hours prior to surgery.   Do not bring valuables to the hospital.  [facility] is not responsible  for any belongings or valuables.               Contacts, dentures or bridgework may not be worn into surgery.  Leave suitcase in the car. After surgery it may be brought to your room.  For patients admitted to the hospital, discharge time is determined by your treatment team.               Patients discharged the day of surgery will not be allowed to drive home.  Name and phone number of your driver:   Special Instructions:  Special Instructions:Special Instructions: [facility] - Preparing for Surgery  Before surgery, you can play an important role.  Because skin is not sterile, your skin needs to be as free of germs as possible.  You can reduce the number of germs on you skin by washing with CHG (chlorahexidine gluconate) soap before surgery.  CHG is an antiseptic cleaner which kills germs and bonds with the skin  to continue killing germs even after washing.  Please DO NOT use if you have an allergy to CHG or antibacterial soaps.  If your skin becomes reddened/irritated stop using the CHG and inform your nurse when you arrive at Short Stay.  Do not shave (including legs and underarms) for at least 48 hours prior to the first CHG shower.  You may shave your face.  Please follow these instructions carefully:   1.  Shower with CHG Soap the night before surgery and the morning of Surgery.  2.  If you choose to wash your hair, wash your hair first as usual with your normal shampoo.  3.  After you shampoo, rinse your hair and body thoroughly to remove the Shampoo.  4.  Use CHG as you would any other liquid soap.  You can apply chg directly  to the skin and wash gently with scrungie or a clean washcloth.  5.  Apply the CHG Soap to your body ONLY FROM THE NECK DOWN.  Do not use on open wounds or open sores.  Avoid contact with your eyes, ears, mouth and genitals (private parts).  Wash genitals (private parts) with your normal soap.  6.  Wash thoroughly, paying special attention to the area where your surgery will be performed.  7.  Thoroughly rinse your body with warm water from the neck down.  8.  DO NOT shower/wash with your normal  soap after using and rinsing off the CHG Soap.  9.  Pat yourself dry with a clean towel.            10.  Wear clean pajamas.            11.  Place clean sheets on your bed the night of your first shower and do not sleep with pets.  Day of Surgery  Do not apply any lotions/deodorants the morning of surgery.  Please wear clean clothes to the hospital/surgery center.   Please read over the following fact sheets that you were given: Pain Booklet, Coughing and Deep Breathing, Blood Transfusion Information and Surgical Site Infection Prevention

## 2014-10-24 NOTE — Progress Notes (Signed)
Nutrition followup completed with patient and husband. Patient has completed radiation and chemotherapy for cancer of the pancreas. Whipple procedure has been scheduled for December 3 after one more test/procedure, which patient will receive tomorrow.   Patient has had continued weight loss with current weight documented as 170.9 pounds down from 173 pounds on November 4. Patient reports oral intake is slowly improving.  Nutrition diagnosis: Unintended weight loss continues.  Intervention:   Patient was educated on participating in trial using impact AR before and after her pending Whipple surgery. Trial was explained to patient.  Patient signed consent form to participate. Patient will pick up one case of impact AR tomorrow after final procedure. This is complimentary. Questions were answered.  Teach back method used.  Monitoring, evaluation, and goals: Patient will tolerate 3 cartons of impact AR for 5 days prior to Whipple surgery and 5 days after Whipple surgery. Patient will have improved outcomes after surgery.  Next visit: To be scheduled.   **Disclaimer: This note was dictated with voice recognition software. Similar sounding words can inadvertently be transcribed and this note may contain transcription errors which may not have been corrected upon publication of note.**

## 2014-10-25 ENCOUNTER — Encounter (HOSPITAL_COMMUNITY)
Admission: RE | Disposition: A | Discharge status: Home / Self Care | Payer: Self-pay | Source: Ambulatory Visit | Attending: Gastroenterology

## 2014-10-25 ENCOUNTER — Encounter (HOSPITAL_COMMUNITY): Payer: Self-pay | Admitting: *Deleted

## 2014-10-25 ENCOUNTER — Ambulatory Visit (HOSPITAL_COMMUNITY)
Admission: RE | Admit: 2014-10-25 | Discharge: 2014-10-25 | Disposition: A | Discharge status: Home / Self Care | Payer: Medicare Other | Source: Ambulatory Visit | Attending: Gastroenterology | Admitting: Gastroenterology

## 2014-10-25 ENCOUNTER — Ambulatory Visit (HOSPITAL_COMMUNITY): Payer: Medicare Other | Admitting: Registered Nurse

## 2014-10-25 ENCOUNTER — Encounter: Payer: Self-pay | Admitting: *Deleted

## 2014-10-25 DIAGNOSIS — K219 Gastro-esophageal reflux disease without esophagitis: Secondary | ICD-10-CM | POA: Insufficient documentation

## 2014-10-25 DIAGNOSIS — C259 Malignant neoplasm of pancreas, unspecified: Secondary | ICD-10-CM | POA: Insufficient documentation

## 2014-10-25 DIAGNOSIS — K269 Duodenal ulcer, unspecified as acute or chronic, without hemorrhage or perforation: Secondary | ICD-10-CM | POA: Diagnosis not present

## 2014-10-25 DIAGNOSIS — Z87891 Personal history of nicotine dependence: Secondary | ICD-10-CM | POA: Insufficient documentation

## 2014-10-25 DIAGNOSIS — I10 Essential (primary) hypertension: Secondary | ICD-10-CM | POA: Insufficient documentation

## 2014-10-25 HISTORY — PX: FINE NEEDLE ASPIRATION: SHX5430

## 2014-10-25 HISTORY — PX: EUS: SHX5427

## 2014-10-25 SURGERY — ESOPHAGEAL ENDOSCOPIC ULTRASOUND (EUS) RADIAL
Anesthesia: Monitor Anesthesia Care

## 2014-10-25 MED ORDER — SODIUM CHLORIDE 0.9 % IV SOLN: INTRAVENOUS | Status: DC

## 2014-10-25 MED ORDER — PROPOFOL INFUSION 10 MG/ML OPTIME
INTRAVENOUS | Status: DC | PRN
  Administered 2014-10-25: 300 ug/kg/min via INTRAVENOUS

## 2014-10-25 MED ORDER — PROPOFOL 10 MG/ML IV BOLUS
INTRAVENOUS | Status: AC
  Filled 2014-10-25: qty 20

## 2014-10-25 MED ORDER — LACTATED RINGERS IV SOLN
INTRAVENOUS | Status: DC | PRN
  Administered 2014-10-25: 09:00:00 via INTRAVENOUS

## 2014-10-25 MED ORDER — PROPOFOL 10 MG/ML IV BOLUS
INTRAVENOUS | Status: AC
Start: 2014-10-25 — End: 2014-10-25
  Filled 2014-10-25: qty 20

## 2014-10-25 MED ORDER — GLYCOPYRROLATE 0.2 MG/ML IJ SOLN
INTRAMUSCULAR | Status: DC | PRN
  Administered 2014-10-25: 0.2 mg via INTRAVENOUS

## 2014-10-25 NOTE — Op Note (Signed)
[place], [id number]   ENDOSCOPIC ULTRASOUND PROCEDURE REPORT  PATIENT: Sonya Larson, Sonya Larson  MR#: [id number] BIRTHDATE: [birth year]  GENDER: female ENDOSCOPIST: Arta Silence, MD REFERRED BY:  Stark Klein, M.D. PROCEDURE DATE:  10/25/2014 PROCEDURE:   Upper EUS ASA CLASS:      Class III INDICATIONS:   1.  restaging of pancreatic cancer after neoadjuvant therapy. MEDICATIONS: Monitored anesthesia care  DESCRIPTION OF PROCEDURE:   After the risks benefits and alternatives of the procedure were  explained, informed consent was obtained. The patient was then placed in the left, lateral, decubitus postion and IV sedation was administered. Throughout the procedure, the patients blood pressure, pulse and oxygen saturations were monitored continuously.  Under direct visualization, the     endoscope was introduced through the mouth and advanced to the second portion of the duodenum .  Water was used as necessary to provide an acoustic interface.  Upon completion of the imaging, water was removed and the patient was sent to the recovery room in satisfactory condition.   FINDINGS:      Approximately 9mm x 1mm  ill-defined hypoechoic lesion seen at interface of head and uncinate pancreas.  Lesion neighbors, but does not appear to invade or abut, the portal vein or superior mesenteric vein.  No involvement of celiac artery was seen.  The lesion does appear to at least abut, and perhaps superficially invade, the superior mesenteric artery along an approximately 1-cm long segment.  There is no obvious neighboring adenopathy.  Incidental mucosal views showed clean-based pyloric channel ulcer as well as some erythema and ulceration in the duodenal bulb, tumor- versus neoadjuvant therapy-related.  IMPRESSION:     As above.  Suspicious findings for at least abutment of part of the tumor along the SMA.  If there is SMA involvement, post-neoadjuvant  radiation treatment staging would be T4 N0 Mx by EUS.  RECOMMENDATIONS:     1.  Watch for potential complications of procedure. 2.  Case discussed with Dr. Barry Dienes; will plan to proceed with attempted Whipple next week, with understanding that if clear arterial involvement is seen intraoperatively then surgery will be aborted. 3.  Follow-up with [facility] on as-needed basis.   _______________________________ Lorrin MaisArta Silence, MD 10/25/2014 10:18 AM   CC:

## 2014-10-25 NOTE — Addendum Note (Signed)
Addended by: Arta Silence on: 10/25/2014 08:35 AM   Modules accepted: Orders

## 2014-10-25 NOTE — Anesthesia Postprocedure Evaluation (Signed)
  Anesthesia Post-op Note  Patient: Sonya Larson  Procedure(s) Performed: Procedure(s): ESOPHAGEAL ENDOSCOPIC ULTRASOUND (EUS) RADIAL (N/A) FINE NEEDLE ASPIRATION (FNA) LINEAR (N/A) Patient is awake and responsive. Pain and nausea are reasonably well controlled. Vital signs are stable and clinically acceptable. Oxygen saturation is clinically acceptable. There are no apparent anesthetic complications at this time. Patient is ready for discharge.

## 2014-10-25 NOTE — Transfer of Care (Signed)
Immediate Anesthesia Transfer of Care Note  Patient: Sonya Larson  Procedure(s) Performed: Procedure(s): ESOPHAGEAL ENDOSCOPIC ULTRASOUND (EUS) RADIAL (N/A) FINE NEEDLE ASPIRATION (FNA) LINEAR (N/A)  Patient Location: PACU  Anesthesia Type:MAC  Level of Consciousness: awake, alert , oriented and patient cooperative  Airway & Oxygen Therapy: Patient Spontanous Breathing and Patient connected to face mask oxygen  Post-op Assessment: Report given to PACU RN, Post -op Vital signs reviewed and stable and Patient moving all extremities X 4  Post vital signs: stable  Complications: No apparent anesthesia complications

## 2014-10-25 NOTE — H&P (Signed)
Patient interval history reviewed.  Patient examined again.  There has been no change from documented H/P dated 10/16/14 (scanned into chart from our office) except as documented above.  Assessment:  1.  Pancreatic adenocarcinoma of uncinate process.  Patient has completed chemoradiative therapy.  There is question of superior mesenteric artery involvement on recent imaging.  Plan:  1.  Repeat endoscopic ultrasound to reassess tumor and reassess for vascular involvement. 2.  Risks (bleeding, infection, bowel perforation that could require surgery, sedation-related changes in cardiopulmonary systems), benefits (identification and possible treatment of source of symptoms, exclusion of certain causes of symptoms), and alternatives (watchful waiting, radiographic imaging studies, empiric medical treatment) of upper endoscopy with ultrasound (EUS) were explained to patient/family in detail and patient wishes to proceed.

## 2014-10-25 NOTE — Anesthesia Preprocedure Evaluation (Signed)
Anesthesia Evaluation  Patient identified by MRN, date of birth, ID band Patient awake    Reviewed: Allergy & Precautions, H&P , Patient's Chart, lab work & pertinent test results, reviewed documented beta blocker date and time   Airway Mallampati: II  TM Distance: >3 FB Neck ROM: full    Dental no notable dental hx.    Pulmonary asthma (No Inhaler) , former smoker,  breath sounds clear to auscultation  Pulmonary exam normal       Cardiovascular hypertension, On Medications Rhythm:regular Rate:Normal     Neuro/Psych    GI/Hepatic GERD-  Medicated,  Endo/Other    Renal/GU      Musculoskeletal   Abdominal   Peds  Hematology   Anesthesia Other Findings   Reproductive/Obstetrics                             Anesthesia Physical Anesthesia Plan  ASA: II  Anesthesia Plan: MAC   Post-op Pain Management:    Induction: Intravenous  Airway Management Planned: Mask and Natural Airway  Additional Equipment:   Intra-op Plan:   Post-operative Plan:   Informed Consent: I have reviewed the patients History and Physical, chart, labs and discussed the procedure including the risks, benefits and alternatives for the proposed anesthesia with the patient or authorized representative who has indicated his/her understanding and acceptance.   Dental Advisory Given  Plan Discussed with: CRNA and Surgeon  Anesthesia Plan Comments: (Discussed sedation and potential to need to place airway or ETT if warranted by clinical changes intra-operatively. We will start procedure as MAC.)        Anesthesia Quick Evaluation

## 2014-10-25 NOTE — Discharge Instructions (Signed)
Esophagogastroduodenoscopy °Care After °Refer to this sheet in the next few weeks. These instructions provide you with information on caring for yourself after your procedure. Your caregiver may also give you more specific instructions. Your treatment has been planned according to current medical practices, but problems sometimes occur. Call your caregiver if you have any problems or questions after your procedure.  °HOME CARE INSTRUCTIONS °· Do not eat or drink anything until the numbing medicine (local anesthetic) has worn off and your gag reflex has returned. You will know that the local anesthetic has worn off when you can swallow comfortably. °· Do not drive for 12 hours after the procedure or as directed by your caregiver. °· Only take medicines as directed by your caregiver. °SEEK MEDICAL CARE IF:  °· You cannot stop coughing. °· You are not urinating at all or less than usual. °SEEK IMMEDIATE MEDICAL CARE IF: °· You have difficulty swallowing. °· You cannot eat or drink. °· You have worsening throat or chest pain. °· You have dizziness, lightheadedness, or you faint. °· You have nausea or vomiting. °· You have chills. °· You have a fever. °· You have severe abdominal pain. °· You have black, tarry, or bloody stools. °Document Released: 11/03/2012 Document Reviewed: 11/03/2012 °Exit[employer]® Patient Information ©2015 [employer]. This information is not intended to replace advice given to you by your health care provider. Make sure you discuss any questions you have with your health care provider. ° °

## 2014-10-25 NOTE — Progress Notes (Signed)
[place] Social Work  Clinical Social Work was referred by patient  to review and complete healthcare advance directives.  Clinical Social Worker met with patient and patients husband in [place] office.  The patient designated Eliyanna Ault as their primary healthcare agent and Virginia Curl as their secondary agent.  Patient declined to completed healthcare living will.    Clinical Social Worker notarized documents and made copies for patient/family. Clinical Social Worker will send documents to medical records to be scanned into patient's chart. Clinical Social Worker encouraged patient/family to contact with any additional questions or concerns.  Johnnye Lana, MSW, LCSW, OSW-C Clinical Social Worker [facility] [phone]

## 2014-10-30 ENCOUNTER — Ambulatory Visit
Admission: RE | Admit: 2014-10-30 | Discharge: 2014-10-30 | Disposition: A | Discharge status: Home / Self Care | Payer: BC Managed Care – PPO | Source: Ambulatory Visit | Attending: Radiation Oncology | Admitting: Radiation Oncology

## 2014-10-30 ENCOUNTER — Encounter (HOSPITAL_COMMUNITY): Payer: Self-pay | Admitting: Gastroenterology

## 2014-10-30 VITALS — BP 113/62 | HR 66 | Temp 98.0°F | Ht 63.0 in | Wt 175.5 lb

## 2014-10-30 DIAGNOSIS — C250 Malignant neoplasm of head of pancreas: Secondary | ICD-10-CM

## 2014-10-30 HISTORY — DX: Personal history of irradiation: Z92.3

## 2014-10-30 NOTE — Progress Notes (Signed)
Sonya Larson here today for reassessment s/p radiation for pancreatic cancer.  She states an improvement in her energy level and denies any pain today.  She has gained 5 lbs since last Epic weight and is eating anything she desires at this time.

## 2014-10-31 ENCOUNTER — Encounter: Payer: Self-pay | Admitting: Radiation Oncology

## 2014-10-31 NOTE — Progress Notes (Signed)
Radiation Oncology         [phone] ________________________________  Name: Sonya Larson MRN: [id number]  Date: 10/30/2014  DOB: [birth year]  Follow-Up Visit Note  CC: Eulas Post, MD  Eulas Post, MD  Diagnosis:   Adenocarcinoma of the pancreas, locally advanced  Interval Since Last Radiation:  One month   Narrative:  The patient returns today for routine follow-up.  The patient states that she is doing well. Her energy level has improved. She has gained 5 pounds and she states that her appetite has been good. Overall she states that she feels quite well at this time. The patient has undergone repeat imaging including CT imaging as well as endoscopic ultrasound. The tumor is close to the vasculature but it is felt that she is suitable to try to proceed with surgical resection. This is scheduled within the next week.                              ALLERGIES:  has No Known Allergies.  Meds: Current Outpatient Prescriptions  Medication Sig Dispense Refill  . atorvastatin (LIPITOR) 10 MG tablet TAKE 1 TABLET BY MOUTH EVERY DAY (Patient taking differently: TAKE 1 TABLET BY MOUTH EVERY DAY, morning) 90 tablet 1  . budesonide-formoterol (SYMBICORT) 160-4.5 MCG/ACT inhaler Inhale 1 puff into the lungs daily as needed (asthma symptoms).     . calcium carbonate (TUMS - DOSED IN MG ELEMENTAL CALCIUM) 500 MG chewable tablet Chew 2 tablets by mouth daily as needed for indigestion or heartburn.     . cetirizine (ZYRTEC) 10 MG tablet Take 10 mg by mouth daily as needed for allergies or rhinitis.     . hydrochlorothiazide (HYDRODIURIL) 25 MG tablet TAKE 1 TABLET BY MOUTH EVERY DAY (Patient taking differently: TAKE 1 TABLET BY MOUTH EVERY DAY, morning) 90 tablet 1  . ibuprofen (ADVIL,MOTRIN) 200 MG tablet Take 600 mg by mouth 2 (two) times daily as needed (pain).     Marland Kitchen lisinopril (PRINIVIL,ZESTRIL) 40 MG tablet TAKE 1 TABLET BY MOUTH EVERY DAY (Patient taking differently: TAKE 1 TABLET  BY MOUTH EVERY DAY, morning) 90 tablet 1  . LORazepam (ATIVAN) 0.5 MG tablet TAKE 1 TABLET BY MOUTH EVERY 6 TO 8 HOURS AS NEEDED FOR SEVERE ANXIETY 30 tablet 0  . potassium chloride SA (K-DUR,KLOR-CON) 20 MEQ tablet Take 1 tablet (20 mEq total) by mouth 2 (two) times daily. 30 tablet 2  . ondansetron (ZOFRAN) 8 MG tablet Take 1 tablet (8 mg total) by mouth every 8 (eight) hours as needed for nausea or vomiting. (Patient not taking: Reported on 10/30/2014) 30 tablet 1   No current facility-administered medications for this encounter.    Physical Findings: The patient is in no acute distress. Patient is alert and oriented.  height is 5\' 3"  (1.6 m) and weight is 175 lb 8 oz (79.606 kg). Her temperature is 98 F (36.7 C). Her blood pressure is 113/62 and her pulse is 66. .    Lab Findings: Lab Results  Component Value Date   WBC 4.3 10/24/2014   HGB 12.8 10/24/2014   HCT 38.2 10/24/2014   MCV 94.6 10/24/2014   PLT 227 10/24/2014     Radiographic Findings: Ct Abd Wo & W Cm  10/03/2014   CLINICAL DATA:  Adenocarcinoma the pancreas diagnosed 7/15. Treated by radiation therapy and chemotherapy. Chemotherapy completed 12 days ago. Constipation.Adenocarcinoma of head of pancreas C25.0 (ICD-10-CM)  EXAM: CT  ABDOMEN WITHOUT AND WITH CONTRAST  TECHNIQUE: Multidetector CT imaging of the abdomen was performed following the standard protocol before and following the bolus administration of intravenous contrast.  CONTRAST:  100 cc Omnipaque 350  COMPARISON:  07/20/2014 PET.  07/04/2014 MRI.  FINDINGS: Lower chest: Clear lung bases. Normal heart size, without pericardial or pleural effusion.  Hepatobiliary: Mild hepatic steatosis. More focal steatosis adjacent the gallbladder and falciform ligament. Normal gallbladder, without biliary ductal dilatation.  Pancreas: Mild pancreatic dilatation within the head and neck, similar. Hypo attenuating mass within the pancreatic uncinate process measures 2.4 x 2.0 cm  on image 51 of series 4. Compare 3.8 x 2.5 cm on image 45 of series 12 of the 07/04/2014 MRI. Abnormal soft tissue extends superiorly and to the left, surrounding the superior mesenteric artery about its posterior 180 degrees. Example image 22 of series 3. Felt to be similar to image 35 of series 12 of the 07/04/2014 MRI. Celiac, splenic vein, portal vein, and superior mesenteric vein uninvolved by tumor. No evidence of acute pancreatitis.  Spleen: Normal  Adrenals/Urinary Tract: Normal adrenal glands. Normal kidneys, without hydronephrosis or hydroureter.  Stomach/Bowel: Normal stomach, without wall thickening. Normal large and small bowel loops.  Vascular/Lymphatic: Aortic and branch vessel atherosclerosis. Suspicion of superior mesenteric artery involvement, as detailed above. Accessory lower pole right renal artery. Portacaval node measures 1.0 cm on image 21 of series 3 and is unchanged.  Other:  No ascites.  No evidence of omental or peritoneal disease.  Musculoskeletal: No focal osseous abnormality.  IMPRESSION: 1. Decreased size of an uncinate process pancreatic mass. 2. Similar size of a portal caval node, presumed malignant based on hypermetabolism on prior PET. 3. Soft tissue density extending off the cephalad aspect of the uncinate process mass. This contacts the superior mesenteric artery over an approximately 180 degree span, suspicious for vascular involvement. This appearance is felt to be similar to on the prior. 4. Hepatic steatosis.   Electronically Signed   By: Abigail Miyamoto M.D.   On: 10/03/2014 10:45    Impression:    The patient clinically is doing quite well. Feeling well and is receding with surgery in the near future. She is very pleased with this plan and the opportunity for possible resection.   Plan:  Follow-up in 6 months.   Jodelle Gross, M.D., Ph.D.

## 2014-11-01 ENCOUNTER — Ambulatory Visit: Payer: BC Managed Care – PPO | Admitting: Radiation Oncology

## 2014-11-01 MED ORDER — CEFAZOLIN SODIUM-DEXTROSE 2-3 GM-% IV SOLR
2.0000 g | INTRAVENOUS | Status: AC
  Administered 2014-11-02 (×2): 2 g via INTRAVENOUS
  Filled 2014-11-01: qty 50

## 2014-11-02 ENCOUNTER — Inpatient Hospital Stay (HOSPITAL_COMMUNITY)
Admission: RE | Admit: 2014-11-02 | Discharge: 2014-11-20 | DRG: 405 | Disposition: A | Discharge status: Home Health | Payer: Medicare Other | Source: Ambulatory Visit | Attending: General Surgery | Admitting: General Surgery

## 2014-11-02 ENCOUNTER — Inpatient Hospital Stay (HOSPITAL_COMMUNITY): Payer: Medicare Other | Admitting: Anesthesiology

## 2014-11-02 ENCOUNTER — Encounter (HOSPITAL_COMMUNITY): Payer: Self-pay | Admitting: *Deleted

## 2014-11-02 ENCOUNTER — Encounter (HOSPITAL_COMMUNITY)
Admission: RE | Disposition: A | Discharge status: Home Health | Payer: Self-pay | Source: Ambulatory Visit | Attending: General Surgery

## 2014-11-02 DIAGNOSIS — Z95828 Presence of other vascular implants and grafts: Secondary | ICD-10-CM

## 2014-11-02 DIAGNOSIS — Z823 Family history of stroke: Secondary | ICD-10-CM

## 2014-11-02 DIAGNOSIS — Z800 Family history of malignant neoplasm of digestive organs: Secondary | ICD-10-CM | POA: Diagnosis not present

## 2014-11-02 DIAGNOSIS — Z803 Family history of malignant neoplasm of breast: Secondary | ICD-10-CM

## 2014-11-02 DIAGNOSIS — D62 Acute posthemorrhagic anemia: Secondary | ICD-10-CM | POA: Diagnosis present

## 2014-11-02 DIAGNOSIS — K30 Functional dyspepsia: Secondary | ICD-10-CM | POA: Diagnosis present

## 2014-11-02 DIAGNOSIS — D638 Anemia in other chronic diseases classified elsewhere: Secondary | ICD-10-CM | POA: Diagnosis present

## 2014-11-02 DIAGNOSIS — F419 Anxiety disorder, unspecified: Secondary | ICD-10-CM | POA: Diagnosis present

## 2014-11-02 DIAGNOSIS — C250 Malignant neoplasm of head of pancreas: Secondary | ICD-10-CM | POA: Diagnosis present

## 2014-11-02 DIAGNOSIS — Z87891 Personal history of nicotine dependence: Secondary | ICD-10-CM

## 2014-11-02 DIAGNOSIS — E780 Pure hypercholesterolemia: Secondary | ICD-10-CM | POA: Diagnosis present

## 2014-11-02 DIAGNOSIS — I10 Essential (primary) hypertension: Secondary | ICD-10-CM | POA: Diagnosis present

## 2014-11-02 DIAGNOSIS — Z23 Encounter for immunization: Secondary | ICD-10-CM | POA: Diagnosis not present

## 2014-11-02 DIAGNOSIS — F329 Major depressive disorder, single episode, unspecified: Secondary | ICD-10-CM | POA: Diagnosis present

## 2014-11-02 DIAGNOSIS — E43 Unspecified severe protein-calorie malnutrition: Secondary | ICD-10-CM | POA: Diagnosis present

## 2014-11-02 DIAGNOSIS — E876 Hypokalemia: Secondary | ICD-10-CM | POA: Diagnosis present

## 2014-11-02 DIAGNOSIS — J45909 Unspecified asthma, uncomplicated: Secondary | ICD-10-CM | POA: Diagnosis present

## 2014-11-02 DIAGNOSIS — K649 Unspecified hemorrhoids: Secondary | ICD-10-CM | POA: Diagnosis present

## 2014-11-02 HISTORY — PX: WHIPPLE PROCEDURE: SHX2667

## 2014-11-02 HISTORY — PX: LAPAROSCOPY: SHX197

## 2014-11-02 LAB — GLUCOSE, CAPILLARY: Glucose-Capillary: 168 mg/dL — ABNORMAL HIGH (ref 70–99)

## 2014-11-02 LAB — CBC
HEMATOCRIT: 31.3 % — AB (ref 36.0–46.0)
HEMOGLOBIN: 10.5 g/dL — AB (ref 12.0–15.0)
MCH: 31.6 pg (ref 26.0–34.0)
MCHC: 33.5 g/dL (ref 30.0–36.0)
MCV: 94.3 fL (ref 78.0–100.0)
Platelets: 216 10*3/uL (ref 150–400)
RBC: 3.32 MIL/uL — AB (ref 3.87–5.11)
RDW: 14.8 % (ref 11.5–15.5)
WBC: 13.9 10*3/uL — ABNORMAL HIGH (ref 4.0–10.5)

## 2014-11-02 LAB — CREATININE, SERUM
Creatinine, Ser: 0.64 mg/dL (ref 0.50–1.10)
GFR calc Af Amer: >90 mL/min (ref >90)
GFR calc non Af Amer: >90 mL/min (ref >90)

## 2014-11-02 LAB — MRSA PCR SCREENING: MRSA by PCR: NEGATIVE

## 2014-11-02 SURGERY — LAPAROSCOPY, DIAGNOSTIC
Anesthesia: General | Site: Abdomen

## 2014-11-02 MED ORDER — LORAZEPAM BOLUS VIA INFUSION: 0.5000 mg | Freq: Two times a day (BID) | INTRAVENOUS | Status: DC | PRN

## 2014-11-02 MED ORDER — CHLORHEXIDINE GLUCONATE 0.12 % MT SOLN
15.0000 mL | Freq: Two times a day (BID) | OROMUCOSAL | Status: DC
  Administered 2014-11-03 – 2014-11-13 (×12): 15 mL via OROMUCOSAL
  Filled 2014-11-02 (×16): qty 15

## 2014-11-02 MED ORDER — NEOSTIGMINE METHYLSULFATE 10 MG/10ML IV SOLN
INTRAVENOUS | Status: DC | PRN
  Administered 2014-11-02: 5 mg via INTRAVENOUS

## 2014-11-02 MED ORDER — VECURONIUM BROMIDE 10 MG IV SOLR
INTRAVENOUS | Status: AC
  Filled 2014-11-02: qty 10

## 2014-11-02 MED ORDER — STERILE WATER FOR IRRIGATION IR SOLN
Status: DC | PRN
  Administered 2014-11-02: 1000 mL

## 2014-11-02 MED ORDER — LACTATED RINGERS IV SOLN
INTRAVENOUS | Status: DC | PRN
  Administered 2014-11-02: 07:00:00 via INTRAVENOUS

## 2014-11-02 MED ORDER — LIDOCAINE HCL (PF) 1 % IJ SOLN
INTRAMUSCULAR | Status: AC
  Filled 2014-11-02: qty 30

## 2014-11-02 MED ORDER — DIPHENHYDRAMINE HCL 50 MG/ML IJ SOLN: 12.5000 mg | Freq: Four times a day (QID) | INTRAMUSCULAR | Status: DC | PRN

## 2014-11-02 MED ORDER — BUPIVACAINE 0.25 % ON-Q PUMP DUAL CATH 300 ML
300.0000 mL | INJECTION | Status: AC
  Filled 2014-11-02: qty 300

## 2014-11-02 MED ORDER — SODIUM CHLORIDE 0.9 % IV BOLUS (SEPSIS)
500.0000 mL | Freq: Once | INTRAVENOUS | Status: AC
  Administered 2014-11-02: 500 mL via INTRAVENOUS

## 2014-11-02 MED ORDER — NALOXONE HCL 0.4 MG/ML IJ SOLN: 0.4000 mg | INTRAMUSCULAR | Status: DC | PRN

## 2014-11-02 MED ORDER — ARTIFICIAL TEARS OP OINT
TOPICAL_OINTMENT | OPHTHALMIC | Status: DC | PRN
  Administered 2014-11-02: 1 via OPHTHALMIC

## 2014-11-02 MED ORDER — PHENYLEPHRINE HCL 10 MG/ML IJ SOLN
INTRAMUSCULAR | Status: DC | PRN
  Administered 2014-11-02 (×3): 40 ug via INTRAVENOUS

## 2014-11-02 MED ORDER — EVICEL 5 ML EX KIT
PACK | CUTANEOUS | Status: DC | PRN
  Administered 2014-11-02: 5 mL

## 2014-11-02 MED ORDER — GLYCOPYRROLATE 0.2 MG/ML IJ SOLN
INTRAMUSCULAR | Status: DC | PRN
  Administered 2014-11-02: 0.2 mg via INTRAVENOUS
  Administered 2014-11-02: 0.6 mg via INTRAVENOUS

## 2014-11-02 MED ORDER — FENTANYL CITRATE 0.05 MG/ML IJ SOLN
INTRAMUSCULAR | Status: DC | PRN
  Administered 2014-11-02 (×3): 50 ug via INTRAVENOUS
  Administered 2014-11-02: 100 ug via INTRAVENOUS
  Administered 2014-11-02 (×2): 50 ug via INTRAVENOUS
  Administered 2014-11-02: 100 ug via INTRAVENOUS
  Administered 2014-11-02: 50 ug via INTRAVENOUS

## 2014-11-02 MED ORDER — ENOXAPARIN SODIUM 40 MG/0.4ML ~~LOC~~ SOLN
40.0000 mg | SUBCUTANEOUS | Status: DC
  Administered 2014-11-03 – 2014-11-20 (×18): 40 mg via SUBCUTANEOUS
  Filled 2014-11-02 (×25): qty 0.4

## 2014-11-02 MED ORDER — MORPHINE SULFATE (PF) 1 MG/ML IV SOLN
INTRAVENOUS | Status: AC
  Filled 2014-11-02: qty 25

## 2014-11-02 MED ORDER — STERILE WATER FOR INJECTION IJ SOLN
INTRAMUSCULAR | Status: AC
  Filled 2014-11-02: qty 10

## 2014-11-02 MED ORDER — ONDANSETRON HCL 4 MG PO TABS
4.0000 mg | ORAL_TABLET | Freq: Four times a day (QID) | ORAL | Status: DC | PRN
  Administered 2014-11-11 – 2014-11-20 (×2): 4 mg via ORAL
  Filled 2014-11-02 (×2): qty 1

## 2014-11-02 MED ORDER — CETYLPYRIDINIUM CHLORIDE 0.05 % MT LIQD
7.0000 mL | Freq: Two times a day (BID) | OROMUCOSAL | Status: DC
  Administered 2014-11-03 – 2014-11-06 (×5): 7 mL via OROMUCOSAL

## 2014-11-02 MED ORDER — LIDOCAINE HCL (CARDIAC) 20 MG/ML IV SOLN
INTRAVENOUS | Status: DC | PRN
  Administered 2014-11-02: 80 mg via INTRAVENOUS

## 2014-11-02 MED ORDER — LIDOCAINE HCL (CARDIAC) 20 MG/ML IV SOLN
INTRAVENOUS | Status: AC
Start: 2014-11-02 — End: 2014-11-02
  Filled 2014-11-02: qty 5

## 2014-11-02 MED ORDER — SODIUM CHLORIDE 0.9 % IJ SOLN: 9.0000 mL | INTRAMUSCULAR | Status: DC | PRN

## 2014-11-02 MED ORDER — 0.9 % SODIUM CHLORIDE (POUR BTL) OPTIME
TOPICAL | Status: DC | PRN
  Administered 2014-11-02 (×3): 1000 mL

## 2014-11-02 MED ORDER — ONDANSETRON HCL 4 MG/2ML IJ SOLN
4.0000 mg | Freq: Four times a day (QID) | INTRAMUSCULAR | Status: DC | PRN
  Administered 2014-11-02 – 2014-11-19 (×15): 4 mg via INTRAVENOUS
  Filled 2014-11-02 (×17): qty 2

## 2014-11-02 MED ORDER — BUPIVACAINE 0.25 % ON-Q PUMP DUAL CATH 300 ML
INJECTION | Status: DC | PRN
  Administered 2014-11-02: 300 mL

## 2014-11-02 MED ORDER — MIDAZOLAM HCL 2 MG/2ML IJ SOLN
INTRAMUSCULAR | Status: AC
  Filled 2014-11-02: qty 2

## 2014-11-02 MED ORDER — ONDANSETRON HCL 4 MG/2ML IJ SOLN
INTRAMUSCULAR | Status: DC | PRN
  Administered 2014-11-02: 4 mg via INTRAVENOUS

## 2014-11-02 MED ORDER — OXYCODONE HCL 5 MG/5ML PO SOLN: 5.0000 mg | Freq: Once | ORAL | Status: DC | PRN

## 2014-11-02 MED ORDER — LACTATED RINGERS IV SOLN
INTRAVENOUS | Status: DC | PRN
  Administered 2014-11-02 (×4): via INTRAVENOUS

## 2014-11-02 MED ORDER — OXYCODONE HCL 5 MG PO TABS: 5.0000 mg | ORAL_TABLET | Freq: Once | ORAL | Status: DC | PRN

## 2014-11-02 MED ORDER — BUDESONIDE-FORMOTEROL FUMARATE 160-4.5 MCG/ACT IN AERO: 1.0000 | INHALATION_SPRAY | Freq: Every day | RESPIRATORY_TRACT | Status: DC | PRN

## 2014-11-02 MED ORDER — ROCURONIUM BROMIDE 50 MG/5ML IV SOLN
INTRAVENOUS | Status: AC
  Filled 2014-11-02: qty 1

## 2014-11-02 MED ORDER — NEOSTIGMINE METHYLSULFATE 10 MG/10ML IV SOLN
INTRAVENOUS | Status: AC
  Filled 2014-11-02: qty 1

## 2014-11-02 MED ORDER — LIDOCAINE HCL 1 % IJ SOLN
INTRAMUSCULAR | Status: DC | PRN
  Administered 2014-11-02: 11 mL via SUBCUTANEOUS

## 2014-11-02 MED ORDER — PHENYLEPHRINE HCL 10 MG/ML IJ SOLN
10.0000 mg | INTRAVENOUS | Status: DC | PRN
  Administered 2014-11-02: 10 ug/min via INTRAVENOUS

## 2014-11-02 MED ORDER — PROPOFOL 10 MG/ML IV BOLUS
INTRAVENOUS | Status: DC | PRN
  Administered 2014-11-02: 150 mg via INTRAVENOUS

## 2014-11-02 MED ORDER — LORAZEPAM 2 MG/ML IJ SOLN
0.5000 mg | Freq: Two times a day (BID) | INTRAMUSCULAR | Status: DC | PRN
  Administered 2014-11-04 – 2014-11-19 (×6): 0.5 mg via INTRAVENOUS
  Filled 2014-11-02 (×6): qty 1

## 2014-11-02 MED ORDER — PROPOFOL 10 MG/ML IV BOLUS
INTRAVENOUS | Status: AC
  Filled 2014-11-02: qty 20

## 2014-11-02 MED ORDER — KCL IN DEXTROSE-NACL 20-5-0.45 MEQ/L-%-% IV SOLN
INTRAVENOUS | Status: DC
  Administered 2014-11-02: 100 mL/h via INTRAVENOUS
  Administered 2014-11-03: 100 mL via INTRAVENOUS
  Administered 2014-11-03: 14:00:00 via INTRAVENOUS
  Administered 2014-11-04: 100 mL via INTRAVENOUS
  Filled 2014-11-02 (×5): qty 1000

## 2014-11-02 MED ORDER — VECURONIUM BROMIDE 10 MG IV SOLR
INTRAVENOUS | Status: DC | PRN
  Administered 2014-11-02 (×3): 1 mg via INTRAVENOUS
  Administered 2014-11-02 (×3): 2 mg via INTRAVENOUS
  Administered 2014-11-02 (×2): 1 mg via INTRAVENOUS

## 2014-11-02 MED ORDER — FENTANYL CITRATE 0.05 MG/ML IJ SOLN
INTRAMUSCULAR | Status: AC
  Filled 2014-11-02: qty 5

## 2014-11-02 MED ORDER — EPHEDRINE SULFATE 50 MG/ML IJ SOLN
INTRAMUSCULAR | Status: DC | PRN
  Administered 2014-11-02 (×2): 10 mg via INTRAVENOUS

## 2014-11-02 MED ORDER — MIDAZOLAM HCL 5 MG/5ML IJ SOLN
INTRAMUSCULAR | Status: DC | PRN
  Administered 2014-11-02: 2 mg via INTRAVENOUS

## 2014-11-02 MED ORDER — EVICEL 5 ML EX KIT
PACK | CUTANEOUS | Status: AC
  Filled 2014-11-02: qty 1

## 2014-11-02 MED ORDER — ALBUMIN HUMAN 5 % IV SOLN
INTRAVENOUS | Status: DC | PRN
  Administered 2014-11-02: 11:00:00 via INTRAVENOUS

## 2014-11-02 MED ORDER — GLYCOPYRROLATE 0.2 MG/ML IJ SOLN
INTRAMUSCULAR | Status: AC
  Filled 2014-11-02: qty 3

## 2014-11-02 MED ORDER — ROCURONIUM BROMIDE 100 MG/10ML IV SOLN
INTRAVENOUS | Status: DC | PRN
  Administered 2014-11-02: 50 mg via INTRAVENOUS

## 2014-11-02 MED ORDER — DIPHENHYDRAMINE HCL 12.5 MG/5ML PO ELIX
12.5000 mg | ORAL_SOLUTION | Freq: Four times a day (QID) | ORAL | Status: DC | PRN
  Filled 2014-11-02: qty 5

## 2014-11-02 MED ORDER — INSULIN ASPART 100 UNIT/ML ~~LOC~~ SOLN
0.0000 [IU] | SUBCUTANEOUS | Status: DC
  Administered 2014-11-02: 4 [IU] via SUBCUTANEOUS
  Administered 2014-11-03: 3 [IU] via SUBCUTANEOUS
  Administered 2014-11-03: 4 [IU] via SUBCUTANEOUS
  Administered 2014-11-03 – 2014-11-14 (×21): 3 [IU] via SUBCUTANEOUS

## 2014-11-02 MED ORDER — SUCCINYLCHOLINE CHLORIDE 20 MG/ML IJ SOLN
INTRAMUSCULAR | Status: AC
Start: 2014-11-02 — End: 2014-11-02
  Filled 2014-11-02: qty 1

## 2014-11-02 MED ORDER — ONDANSETRON HCL 4 MG/2ML IJ SOLN
4.0000 mg | Freq: Four times a day (QID) | INTRAMUSCULAR | Status: DC | PRN
  Administered 2014-11-03 – 2014-11-06 (×3): 4 mg via INTRAVENOUS
  Filled 2014-11-02: qty 2

## 2014-11-02 MED ORDER — ONDANSETRON HCL 4 MG/2ML IJ SOLN
INTRAMUSCULAR | Status: AC
  Filled 2014-11-02: qty 2

## 2014-11-02 MED ORDER — CEFAZOLIN SODIUM-DEXTROSE 2-3 GM-% IV SOLR
2.0000 g | Freq: Four times a day (QID) | INTRAVENOUS | Status: AC
  Administered 2014-11-02 – 2014-11-03 (×3): 2 g via INTRAVENOUS
  Filled 2014-11-02 (×3): qty 50

## 2014-11-02 MED ORDER — MORPHINE SULFATE (PF) 1 MG/ML IV SOLN
INTRAVENOUS | Status: DC
  Administered 2014-11-02: 10.5 mg via INTRAVENOUS
  Administered 2014-11-02: 13:00:00 via INTRAVENOUS
  Administered 2014-11-02: 3 mg via INTRAVENOUS
  Administered 2014-11-03: 05:00:00 via INTRAVENOUS
  Administered 2014-11-03: 7.5 mg via INTRAVENOUS
  Administered 2014-11-03: 3 mg via INTRAVENOUS
  Administered 2014-11-03: 12 mg via INTRAVENOUS
  Administered 2014-11-03: 10.5 mg via INTRAVENOUS
  Administered 2014-11-03: 6 mg via INTRAVENOUS
  Administered 2014-11-04: 3.5 mg via INTRAVENOUS
  Administered 2014-11-04: 7.5 mg via INTRAVENOUS
  Administered 2014-11-04: 1.5 mg via INTRAVENOUS
  Administered 2014-11-04: 4.5 mg via INTRAVENOUS
  Administered 2014-11-04: 6 mg via INTRAVENOUS
  Administered 2014-11-04: 03:00:00 via INTRAVENOUS
  Administered 2014-11-04: 4.5 mg via INTRAVENOUS
  Administered 2014-11-05: 1.5 mg via INTRAVENOUS
  Administered 2014-11-05: 1 mg via INTRAVENOUS
  Administered 2014-11-05: 7.5 mg via INTRAVENOUS
  Administered 2014-11-05: 0 mg via INTRAVENOUS
  Administered 2014-11-06 (×2): 1.5 mg via INTRAVENOUS
  Administered 2014-11-06: 0 mg via INTRAVENOUS
  Administered 2014-11-06: 3 mg via INTRAVENOUS
  Administered 2014-11-06 – 2014-11-07 (×2): 1.5 mg via INTRAVENOUS
  Administered 2014-11-07 (×2): 3 mg via INTRAVENOUS
  Filled 2014-11-02 (×4): qty 25

## 2014-11-02 MED ORDER — HYDROMORPHONE HCL 1 MG/ML IJ SOLN
0.2500 mg | INTRAMUSCULAR | Status: DC | PRN
  Administered 2014-11-02 (×4): 0.5 mg via INTRAVENOUS

## 2014-11-02 MED ORDER — HYDROMORPHONE HCL 1 MG/ML IJ SOLN
INTRAMUSCULAR | Status: AC
  Administered 2014-11-02: 0.5 mg via INTRAVENOUS
  Filled 2014-11-02: qty 2

## 2014-11-02 MED ORDER — PANTOPRAZOLE SODIUM 40 MG IV SOLR
40.0000 mg | Freq: Every day | INTRAVENOUS | Status: DC
Start: 2014-11-02 — End: 2014-11-07
  Administered 2014-11-02 – 2014-11-06 (×5): 40 mg via INTRAVENOUS
  Filled 2014-11-02 (×8): qty 40

## 2014-11-02 MED ORDER — BUPIVACAINE-EPINEPHRINE (PF) 0.25% -1:200000 IJ SOLN
INTRAMUSCULAR | Status: AC
  Filled 2014-11-02: qty 30

## 2014-11-02 MED ORDER — METOPROLOL TARTRATE 1 MG/ML IV SOLN
5.0000 mg | Freq: Four times a day (QID) | INTRAVENOUS | Status: DC | PRN
Start: 2014-11-02 — End: 2014-11-20
  Filled 2014-11-02: qty 5

## 2014-11-02 MED ORDER — PROMETHAZINE HCL 25 MG/ML IJ SOLN: 6.2500 mg | INTRAMUSCULAR | Status: DC | PRN

## 2014-11-02 MED ORDER — BUPIVACAINE ON-Q PAIN PUMP (FOR ORDER SET NO CHG)
INJECTION | Status: AC
  Filled 2014-11-02: qty 1

## 2014-11-02 MED ORDER — MORPHINE SULFATE 2 MG/ML IJ SOLN
1.0000 mg | INTRAMUSCULAR | Status: DC | PRN
  Administered 2014-11-07 – 2014-11-08 (×3): 2 mg via INTRAVENOUS
  Filled 2014-11-02 (×4): qty 1

## 2014-11-02 SURGICAL SUPPLY — 123 items
ADH SKN CLS APL DERMABOND .7 (GAUZE/BANDAGES/DRESSINGS) ×1
APL SKNCLS STERI-STRIP NONHPOA (GAUZE/BANDAGES/DRESSINGS) ×1
BENZOIN TINCTURE PRP APPL 2/3 (GAUZE/BANDAGES/DRESSINGS) ×2 IMPLANT
BLADE SURG ROTATE 9660 (MISCELLANEOUS) IMPLANT
BOOT SUTURE AID YELLOW STND (SUTURE) ×3 IMPLANT
BRR ADH 5X3 SEPRAFILM 6 SHT (MISCELLANEOUS)
CANISTER SUCTION 2500CC (MISCELLANEOUS) ×3 IMPLANT
CATH KIT ON Q 7.5IN SLV (PAIN MANAGEMENT) ×6 IMPLANT
CATH ROBINSON RED A/P 16FR (CATHETERS) IMPLANT
CHLORAPREP W/TINT 26ML (MISCELLANEOUS) ×3 IMPLANT
CLIP LIGATING HEM O LOK PURPLE (MISCELLANEOUS) ×10 IMPLANT
CLIP LIGATING HEMO O LOK GREEN (MISCELLANEOUS) ×5 IMPLANT
CLIP LIGATING HEMOLOK MED (MISCELLANEOUS) ×3 IMPLANT
CLIP TI LARGE 6 (CLIP) ×3 IMPLANT
CLIP TI MEDIUM 24 (CLIP) ×3 IMPLANT
CLOSURE WOUND 1/2 X4 (GAUZE/BANDAGES/DRESSINGS) ×1
CONT SPEC 4OZ CLIKSEAL STRL BL (MISCELLANEOUS) ×1 IMPLANT
COVER SURGICAL LIGHT HANDLE (MISCELLANEOUS) ×3 IMPLANT
DECANTER SPIKE VIAL GLASS SM (MISCELLANEOUS) ×2 IMPLANT
DERMABOND ADVANCED (GAUZE/BANDAGES/DRESSINGS) ×2
DERMABOND ADVANCED .7 DNX12 (GAUZE/BANDAGES/DRESSINGS) ×1 IMPLANT
DRAIN CHANNEL 19F RND (DRAIN) ×6 IMPLANT
DRAIN PENROSE 1/2X36 STERILE (WOUND CARE) ×3 IMPLANT
DRAPE LAPAROSCOPIC ABDOMINAL (DRAPES) ×3 IMPLANT
DRAPE UTILITY XL STRL (DRAPES) ×6 IMPLANT
DRAPE WARM FLUID 44X44 (DRAPE) ×3 IMPLANT
DRSG COVADERM 4X10 (GAUZE/BANDAGES/DRESSINGS) ×2 IMPLANT
DRSG COVADERM 4X14 (GAUZE/BANDAGES/DRESSINGS) IMPLANT
DRSG COVADERM 4X6 (GAUZE/BANDAGES/DRESSINGS) IMPLANT
DRSG COVADERM 4X8 (GAUZE/BANDAGES/DRESSINGS) IMPLANT
DRSG TEGADERM 4X4.75 (GAUZE/BANDAGES/DRESSINGS) ×2 IMPLANT
DRSG TELFA 3X8 NADH (GAUZE/BANDAGES/DRESSINGS) IMPLANT
ELECT BLADE 4.0 EZ CLEAN MEGAD (MISCELLANEOUS) ×3
ELECT BLADE 6.5 EXT (BLADE) ×3 IMPLANT
ELECT CAUTERY BLADE 6.4 (BLADE) ×5 IMPLANT
ELECT PAD DSPR THERM+ ADLT (MISCELLANEOUS) ×3 IMPLANT
ELECT REM PT RETURN 9FT ADLT (ELECTROSURGICAL) ×3
ELECTRODE BLDE 4.0 EZ CLN MEGD (MISCELLANEOUS) ×1 IMPLANT
ELECTRODE REM PT RTRN 9FT ADLT (ELECTROSURGICAL) ×1 IMPLANT
EVACUATOR SILICONE 100CC (DRAIN) ×6 IMPLANT
GAUZE SPONGE 4X4 12PLY STRL (GAUZE/BANDAGES/DRESSINGS) IMPLANT
GAUZE SPONGE 4X4 16PLY XRAY LF (GAUZE/BANDAGES/DRESSINGS) IMPLANT
GLOVE BIO SURGEON STRL SZ 6 (GLOVE) ×6 IMPLANT
GLOVE BIO SURGEON STRL SZ7.5 (GLOVE) ×2 IMPLANT
GLOVE BIOGEL PI IND STRL 6.5 (GLOVE) ×1 IMPLANT
GLOVE BIOGEL PI IND STRL 7.5 (GLOVE) IMPLANT
GLOVE BIOGEL PI INDICATOR 6.5 (GLOVE) ×2
GLOVE BIOGEL PI INDICATOR 7.5 (GLOVE) ×2
GOWN STRL REUS W/ TWL LRG LVL3 (GOWN DISPOSABLE) ×2 IMPLANT
GOWN STRL REUS W/TWL 2XL LVL3 (GOWN DISPOSABLE) ×6 IMPLANT
GOWN STRL REUS W/TWL LRG LVL3 (GOWN DISPOSABLE) ×18
HAND PENCIL TRP OPTION (MISCELLANEOUS) IMPLANT
HEMOSTAT SURGICEL 2X14 (HEMOSTASIS) IMPLANT
KIT BASIN OR (CUSTOM PROCEDURE TRAY) ×3 IMPLANT
KIT MARKER MARGIN INK (KITS) ×2 IMPLANT
KIT ROOM TURNOVER OR (KITS) ×3 IMPLANT
LOOP VESSEL MAXI BLUE (MISCELLANEOUS) ×3 IMPLANT
NDL BIOPSY 14X6 SOFT TISS (NEEDLE) IMPLANT
NEEDLE BIOPSY 14X6 SOFT TISS (NEEDLE) IMPLANT
NS IRRIG 1000ML POUR BTL (IV SOLUTION) ×6 IMPLANT
PACK GENERAL/GYN (CUSTOM PROCEDURE TRAY) ×3 IMPLANT
PAD ARMBOARD 7.5X6 YLW CONV (MISCELLANEOUS) ×6 IMPLANT
PAD DRESSING TELFA 3X8 NADH (GAUZE/BANDAGES/DRESSINGS) IMPLANT
PAD SHARPS MAGNETIC DISPOSAL (MISCELLANEOUS) IMPLANT
PLUG CATH AND CAP STER (CATHETERS) IMPLANT
RELOAD PROXIMATE 75MM BLUE (ENDOMECHANICALS) IMPLANT
RELOAD STAPLE 75 3.8 BLU REG (ENDOMECHANICALS) IMPLANT
SCISSORS LAP 5X35 DISP (ENDOMECHANICALS) IMPLANT
SEPRAFILM PROCEDURAL PACK 3X5 (MISCELLANEOUS) IMPLANT
SET IRRIG TUBING LAPAROSCOPIC (IRRIGATION / IRRIGATOR) IMPLANT
SHEARS FOC LG CVD HARMONIC 17C (MISCELLANEOUS) ×3 IMPLANT
SLEEVE ENDOPATH XCEL 5M (ENDOMECHANICALS) ×1 IMPLANT
SPONGE GAUZE 4X4 12PLY STER LF (GAUZE/BANDAGES/DRESSINGS) ×4 IMPLANT
SPONGE INTESTINAL PEANUT (DISPOSABLE) IMPLANT
SPONGE LAP 18X18 X RAY DECT (DISPOSABLE) ×8 IMPLANT
SPONGE SURGIFOAM ABS GEL 100 (HEMOSTASIS) IMPLANT
STAPLER PROXIMATE 75MM BLUE (STAPLE) IMPLANT
STAPLER VISISTAT 35W (STAPLE) ×3 IMPLANT
STRIP CLOSURE SKIN 1/2X4 (GAUZE/BANDAGES/DRESSINGS) ×1 IMPLANT
SUCTION POOLE TIP (SUCTIONS) ×3 IMPLANT
SUT 5.0 PDS RB-1 (SUTURE)
SUT ETHILON 2 0 FS 18 (SUTURE) ×6 IMPLANT
SUT ETHILON 2 LR (SUTURE) ×3 IMPLANT
SUT MNCRL AB 4-0 PS2 18 (SUTURE) ×3 IMPLANT
SUT PDS AB 1 TP1 96 (SUTURE) ×9 IMPLANT
SUT PDS AB 3-0 SH 27 (SUTURE) ×8 IMPLANT
SUT PDS AB 4-0 RB1 27 (SUTURE) ×16 IMPLANT
SUT PDS PLUS AB 5-0 RB-1 (SUTURE) IMPLANT
SUT PROLENE 3 0 SH 48 (SUTURE) ×22 IMPLANT
SUT PROLENE 4 0 RB 1 (SUTURE) ×6
SUT PROLENE 4 0 SH DA (SUTURE) ×6 IMPLANT
SUT PROLENE 4-0 RB1 .5 CRCL 36 (SUTURE) ×2 IMPLANT
SUT PROLENE 5 0 RB 1 DA (SUTURE) ×16 IMPLANT
SUT SILK 2 0 SH CR/8 (SUTURE) ×7 IMPLANT
SUT SILK 2 0 TIES 10X30 (SUTURE) ×3 IMPLANT
SUT SILK 3 0 SH CR/8 (SUTURE) ×3 IMPLANT
SUT SILK 3 0 TIES 10X30 (SUTURE) ×3 IMPLANT
SUT VIC AB 2-0 CT1 27 (SUTURE)
SUT VIC AB 2-0 CT1 TAPERPNT 27 (SUTURE) IMPLANT
SUT VIC AB 2-0 SH 18 (SUTURE) ×3 IMPLANT
SUT VIC AB 2-0 SH 27 (SUTURE) ×3
SUT VIC AB 2-0 SH 27XBRD (SUTURE) IMPLANT
SUT VIC AB 3-0 SH 18 (SUTURE) ×3 IMPLANT
SUT VIC AB 3-0 SH 27 (SUTURE) ×3
SUT VIC AB 3-0 SH 27X BRD (SUTURE) ×1 IMPLANT
SUT VIC AB 4-0 RB1 18 (SUTURE) ×3 IMPLANT
SUT VICRYL AB 2 0 TIES (SUTURE) IMPLANT
TAPE CLOTH SOFT 2X10 (GAUZE/BANDAGES/DRESSINGS) ×2 IMPLANT
TAPE CLOTH SURG 6X10 WHT LF (GAUZE/BANDAGES/DRESSINGS) ×2 IMPLANT
TAPE UMBILICAL 1/8 X36 TWILL (MISCELLANEOUS) ×3 IMPLANT
TOWEL OR 17X24 6PK STRL BLUE (TOWEL DISPOSABLE) ×3 IMPLANT
TOWEL OR 17X26 10 PK STRL BLUE (TOWEL DISPOSABLE) ×3 IMPLANT
TRAY FOLEY CATH 14FRSI W/METER (CATHETERS) ×3 IMPLANT
TRAY LAPAROSCOPIC (CUSTOM PROCEDURE TRAY) ×3 IMPLANT
TROCAR XCEL BLUNT TIP 100MML (ENDOMECHANICALS) IMPLANT
TROCAR XCEL NON-BLD 11X100MML (ENDOMECHANICALS) ×1 IMPLANT
TROCAR XCEL NON-BLD 5MMX100MML (ENDOMECHANICALS) ×3 IMPLANT
TUBE FEEDING 5FR 15 INCH (TUBING) ×2 IMPLANT
TUBE FEEDING 8FR 16IN STR KANG (MISCELLANEOUS) IMPLANT
TUBING INSUFFLATION (TUBING) ×3 IMPLANT
TUNNELER SHEATH ON-Q 16GX12 DP (PAIN MANAGEMENT) ×3 IMPLANT
WATER STERILE IRR 1000ML POUR (IV SOLUTION) ×2 IMPLANT
YANKAUER SUCT BULB TIP NO VENT (SUCTIONS) ×3 IMPLANT

## 2014-11-02 NOTE — H&P (Signed)
Sonya Larson 09/25/2014 3:01 PM Location: [facility] Patient #: [id number] DOB: 1950-12-07 Married / Language: English / Race: White Female  History of Present Illness Patient words: f/u after chemo and radiation.  The patient is a 63 year old female who presents with pancreatic cancer. 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 Tumor markers include CA 19 - 9 level of 7872.6 (pre treatment 07/17/2014). Disease involvement includes regional lymph nodes (periportal LN). The patient was referred by a specialty consultant (Dr. Benay Spice). Initial presentation was 3 month(s) ago for nausea and abdominal pain. Current diagnosis was determined by endoscopic ultrasonography. Past evaluation has included PET CT and Abdominal MRI. Treatment has included neoadjuvant chemotherapy (Xeloda) and radiation therapy. Symptoms include weight loss, anorexia and abdominal pain. The pain radiates to the mid back. Risk factors include smoking (former smoker, only 2.5 pack years). She had a negative diagnostic laparoscopy to evaluate for carcinomatosis. She has just completed neoadjuvant chemoradiation.    Other Problems Anxiety Disorder Asthma Depression Hemorrhoids High blood pressure Hypercholesterolemia Pancreatic Cancer  Past Surgical History  Colon Polyp Removal - Colonoscopy Mammoplasty; Reduction Bilateral.  Diagnostic Studies History Colonoscopy 1-5 years ago Mammogram within last year Pap Smear 1-5 years ago  Allergies  No Known Drug Allergies  Medication History LORazepam (0.5MG  Tablet, Oral) Active. Lisinopril (40MG  Tablet, Oral) Active. Hydrochlorothiazide (25MG  Tablet, Oral) Active. Lipitor (10MG  Tablet, Oral) Active. Xeloda (500MG  Tablet, Oral) Active. ZyrTEC Allergy (10MG  Capsule, Oral) Active. Colace (100MG  Capsule, Oral) Active.  Social History Alcohol use Occasional  alcohol use. Caffeine use Carbonated beverages, Coffee, Tea. No drug use Tobacco use Never smoker.  Family History  Alcohol Abuse Father. Arthritis Mother, Sister. Breast Cancer Family Members In General. Cancer Father. Cerebrovascular Accident Family Members In General. Depression Sister. Hypertension Mother. Malignant Neoplasm Of Pancreas Family Members In General. Thyroid problems Mother.  Pregnancy / Birth History Age at menarche 42 years. Age of menopause 6-60 Gravida 2 Maternal age 78-20 Para 2  Review of Systems General Present- Appetite Loss, Fatigue and Weight Loss. Not Present- Chills, Fever, Night Sweats and Weight Gain. Skin Present- Dryness. Not Present- Change in Wart/Mole, Hives, Jaundice, New Lesions, Non-Healing Wounds, Rash and Ulcer. HEENT Present- Ringing in the Ears, Seasonal Allergies and Wears glasses/contact lenses. Not Present- Earache, Hearing Loss, Hoarseness, Nose Bleed, Oral Ulcers, Sinus Pain, Sore Throat, Visual Disturbances and Yellow Eyes. Respiratory Not Present- Bloody sputum, Chronic Cough, Difficulty Breathing, Snoring and Wheezing. Breast Not Present- Breast Mass, Breast Pain, Nipple Discharge and Skin Changes. Cardiovascular Not Present- Chest Pain, Difficulty Breathing Lying Down, Leg Cramps, Palpitations, Rapid Heart Rate, Shortness of Breath and Swelling of Extremities. Gastrointestinal Present- Abdominal Pain, Bloating, Constipation, Gets full quickly at meals, Hemorrhoids, Indigestion and Nausea. Not Present- Bloody Stool, Change in Bowel Habits, Chronic diarrhea, Difficulty Swallowing, Excessive gas, Rectal Pain and Vomiting. Female Genitourinary Not Present- Frequency, Nocturia, Painful Urination, Pelvic Pain and Urgency. Musculoskeletal Not Present- Back Pain, Joint Pain, Joint Stiffness, Muscle Pain, Muscle Weakness and Swelling of Extremities. Neurological Not Present- Decreased Memory, Fainting, Headaches, Numbness,  Seizures, Tingling, Tremor, Trouble walking and Weakness. Psychiatric Present- Anxiety and Change in Sleep Pattern. Not Present- Bipolar, Depression, Fearful and Frequent crying. Endocrine Not Present- Cold Intolerance, Excessive Hunger, Hair Changes, Heat Intolerance, Hot flashes and New Diabetes. Hematology Not Present- Easy Bruising, Excessive bleeding, Gland problems, HIV and Persistent Infections.   Vitals  Wt Readings from Last 3 Encounters:  11/02/14 175 lb (79.379 kg)  10/30/14 175 lb 8 oz (79.606 kg)  10/24/14 170 lb 13.7 oz (77.5 kg)   Temp Readings from Last 3 Encounters:  11/02/14 98.4 F (36.9 C)   10/30/14 98 F (36.7 C)   10/25/14 97.8 F (36.6 C) Oral   BP Readings from Last 3 Encounters:  11/02/14 130/78  10/30/14 113/62  10/25/14 134/56   Pulse Readings from Last 3 Encounters:  11/02/14 67  10/30/14 66  10/25/14 74     Physical Exam  General Mental Status-Alert. General Appearance-Consistent with stated age. Hydration-Well hydrated. Voice-Normal.  Head and Neck Head-normocephalic, atraumatic with no lesions or palpable masses.  Eye Sclera/Conjunctiva - Bilateral-No scleral icterus.  Chest and Lung Exam Chest and lung exam reveals -quiet, even and easy respiratory effort with no use of accessory muscles. Inspection Chest Wall - Normal. Back - normal.  Breast - Did not examine.  Cardiovascular Cardiovascular examination reveals -normal pedal pulses bilaterally. Note: regular rate and rhythm  Abdomen Inspection-Inspection Normal. Palpation/Percussion Palpation and Percussion of the abdomen reveal - Soft, Non Tender, No Rebound tenderness, No Rigidity (guarding) and No hepatosplenomegaly.  Peripheral Vascular Upper Extremity Inspection - Bilateral - Normal - No Clubbing, No Cyanosis, No Edema, Pulses Intact. Lower Extremity Palpation - Edema - Bilateral - No edema.  Neurologic Neurologic evaluation reveals -alert  and oriented x 3 with no impairment of recent or remote memory. Mental Status-Normal.  Musculoskeletal Global Assessment -Note: no gross deformities.  Normal Exam - Left-Upper Extremity Strength Normal and Lower Extremity Strength Normal. Normal Exam - Right-Upper Extremity Strength Normal and Lower Extremity Strength Normal.  Lymphatic Head & Neck  General Head & Neck Lymphatics: Bilateral - Description - Normal. Axillary  General Axillary Region: Bilateral - Description - Normal. Tenderness - Non Tender.    Assessment & Plan ADENOCARCINOMA OF HEAD OF PANCREAS (157.0  C25.0) Impression: We will set up imaging to follow up chemoradiation. If positive for mets, will not have surgery.  If negative for progression, will set up pancreaticoduodenectomy around 6-8 weeks post completion of treatment.  I discussed the surgery with the patient including diagrams of anatomy. I discussed the potential for diagnostic laparoscopy. In the case of pancreatic cancer, if spread of the disease is found, we will abort the procedure and not proceed with resection. The rationale for this was discussed with the patient. There has not been data to support resection of Stage IV disease in terms of survival benefit.  We discussed possible complications including: Potential of aborting procedure if tumor is invading the superior mesenteric or hepatic arteries Bleeding Infection and possible wound complications such as hernia Damage to adjacent structures Leak of anastamoses, primarily pancreatic Possible need for other procedures Possible prolonged nausea with possible need for external feeding. Possible prolonged hospital stay. Possible development of diabetes or worsening of current diabetes. Possible pancreatic exocrine insufficiency Prolonged fatigue/weakness/appetite Possible early recurrence of cancer   The patient understands and wishes to proceed. The patient has been advised to  turn in disability paperwork to our office. Current Plans  Pt Education - [facility] pt info Schedule for Surgery

## 2014-11-02 NOTE — Anesthesia Preprocedure Evaluation (Addendum)
Anesthesia Evaluation  Patient identified by MRN, date of birth, ID band Patient awake    Reviewed: Allergy & Precautions, NPO status   History of Anesthesia Complications Negative for: history of anesthetic complications  Airway Mallampati: II   Neck ROM: Full    Dental  (+) Teeth Intact, Caps, Dental Advisory Given   Pulmonary asthma , former smoker,  breath sounds clear to auscultation        Cardiovascular hypertension, Rhythm:Regular Rate:Normal     Neuro/Psych    GI/Hepatic   Endo/Other    Renal/GU      Musculoskeletal   Abdominal   Peds  Hematology   Anesthesia Other Findings   Reproductive/Obstetrics                            Anesthesia Physical Anesthesia Plan  ASA: II  Anesthesia Plan: General   Post-op Pain Management:    Induction: Intravenous  Airway Management Planned: Oral ETT  Additional Equipment: Arterial line  Intra-op Plan:   Post-operative Plan: Extubation in OR  Informed Consent: I have reviewed the patients History and Physical, chart, labs and discussed the procedure including the risks, benefits and alternatives for the proposed anesthesia with the patient or authorized representative who has indicated his/her understanding and acceptance.   Dental advisory given  Plan Discussed with: CRNA  Anesthesia Plan Comments:         Anesthesia Quick Evaluation

## 2014-11-02 NOTE — Op Note (Signed)
PREOPERATIVE DIAGNOSIS: adenocarcinoma of the pancreatic head/uncinate process  POSTOPERATIVE DIAGNOSIS: Same.   PROCEDURES PERFORMED:  Diagnostic laparoscopy  Classic pancreaticoduodenectomy with duct to mucosa pancreaticojejunostomy  Placement of pancreatic stent   SURGEON: Stark Klein, MD   ASSISTANT: Coralie Keens, MD   ANESTHESIA: General and epidural   FINDINGS: 3 cm pancreatic head mass. firm pancreatic tissue. 9 mm common bile duct. 3 mm pancreatic duct  SPECIMENS:  1. Pancreaticoduodenectomy with gallbladder:   ESTIMATED BLOOD LOSS: 150 mL.   COMPLICATIONS: None known.   PROCEDURE:   Pt was identified in the holding area and taken to  the operating room, and placed supine on the operating room  table. General anesthesia was induced. The patient's abdomen was  prepped and draped in a sterile fashion, after a Foley catheter was  placed. A time-out was performed according to the surgical safety check  list. When all was correct we continued.   The patient was placed in reverse trendelenburg position and rotated to the right.  The left subcostal margin was anesthetized with local anesthesia.  A 5 mm optiview trocar was placed under direct visualization.  The abdomen was insufflated with carbon dioxide.  The abdomen was examined.    A midline incision was made from the xiphoid to just below the umbilicus. The subcutaneous tissues were divided with the Bovie cautery. The peritoneum was entered in the center of the abdomen. Digital retraction was then used to elevate the preperitoneal fat, and  this was taken with the cautery as well. The subcutaneous tissues and  fascia of the muscular layers were taken laterally with the cautery.  Care was taken to protect the underlying viscera. The Bookwalter self-retaining retractor was placed for visualization. The right colon was taken down off of the white line  of Toldt and from the retroperitoneum at the hepatic flexure. The  porta was identified. The  duodenum was kocherized extensively with blunt dissection and with cautery. The mass was adherent to the retroperitoneal tissues, but with careful dissection, we were able to get the mass off.   The gallbladder was taken off the liver with a combination of blunt dissection and cautery. The cystic duct was clipped with the Hemalock clips. The cystic duct was divided and the gallbladder was passed off.   The common bile duct was skeletonized near the duodenum. A vessel loop was passed around it. The gastroduodenal artery, as well as the common hepatic artery were skeletonized. The proper hepatic artery was traced out to make sure that flow was going to both sides of the liver when the GDA was clamped. The GDA was test clamped with the bulldog, with good flow to the liver and  no signs of ischemia. This was divided with 2-0 silk ties and then clipped. The proper hepatic artery was reflected upward, and the anterior portal vein was exposed.  A Kelly clamp was passed underneath the pancreas at the superior mesenteric vein, and this passed  easily with no signs of tumor involvement.   Attention was then directed to the stomach, and the omentum was taken  off of the stomach at the border of the antrum and the body. The  gastrohepatic ligament was taken down with the harmonic, and care was  taken to make sure there was not a replaced left hepatic artery in this  location. The stomach was divided with the GIA-75 stapler. The border  of the stomach was oversewn with a 3-0 running PDS suture.   Attention was then  directed to the small bowel. Around 10 cm past the  ligament of Treitz was located, and this was divided with the 75-GIA.  The distal portion of the jejunum was also oversewn with a 3-0 PDS  suture. The fourth portion of the duodenum was skeletonized with the  harmonic scalpel, taking down all of the mesenteric vessels. The  ligament of Treitz was taken down. The IMV was  preserved.  The duodenum was then passed underneath the portal vein.   At this point the Claiborne Billings was replaced and the pancreas was divided with the cautery. 2-0 silk sutures were tied down on the inferior and superior border of the pancreas. The Bovie was used to coagulate the small bleeders at the border of the pancreas.  The Overholt in combination with the harmonic and locking Weck clips  were then used to take the uncinate process off of the portal vein and  the superior mesenteric artery. The mass was a bit stuck to the mesenteric artery.  Care was taken not to incorporate the superior mesenteric artery in the dissection. The specimen was then marked and passed off the table for frozen section margin.   The jejunum was then passed underneath  the SMV in order to get appropriate lie for the pancreatic and biliary  anastomoses. The more distal portion of the jejunum was pulled up over  the colon, and two 3-0 silks were placed through the posterior border  of the stomach for the gastrojejunostomy. The stomach and the small  bowel were opened, and a GIA-75 was used to create an end-to-end  anastomosis. The open areas of the staple line were examined to ensure  that there was hemostasis. The defect was then closed with a single  layer of running Connell suture of 3-0 PDS. Prior to a complete  closure, the NG tube was passed toward the afferent limb.   The appropriate location for the choledochojejunostomy was identified, and  the small bowel was opened approximately 10 mm. The anastamosis was created with approximately ten 4-0 interrupted PDS sutures.   The 2 corner sutures were placed first  and then the posterior layer was done in an interrupted fashion tying on  the inside. The superior layer was then closed with interrupted sutures as  well.   At this point the frozens returned back as negative on the pancreatic duct and the bile duct. No additional tissue could be taken from the SMA.  The  pancreatic anastomosis was then created by opening the muscular layer of the jejunum the length of the pancreatic parenchyma. The mucosa was opened around 4 mm.  The pancreas was firm, and the duct was 3 mm. A pediatric feeding tube was used as a pancreatic stent. The posterior layer was formed first with 2-0 silk sutures in interrupted fashion.  The duct to mucosa anastamosis was created with five 5-0 PDS sutures.  The anterior layer was then oversewn with 2-0 silks.The abdomen was irrigated with water.    The areas were then irrigated and then those anastomoses were covered  with Evicel. A small tongue of omentum was placed behind the pancreaticojejunostomy.  This was allowed to dry. The abdomen was then irrigated  again and all the laparotomy sponges were removed. A lap count was  performed, which was correct. Two 19-Blake drains were placed, with the  lateral-most drain placed behind the choledochojejunostomy. The medial  Blake drain was placed just anterior and slightly superior to the  pancreaticojejunostomy. Additional omentum was placed  over the pancreatic and biliary anastamoses.  The fascia was then closed with #1 looped running PDS sutures. The skin was irrigated and then closed with staples. The wounds were cleaned, dried and dressed with a sterile  dressing.   The patient tolerated the procedure well and was extubated and taken to  PACU in stable condition. Needle and sponge counts were correct x2.

## 2014-11-02 NOTE — Anesthesia Postprocedure Evaluation (Signed)
  Anesthesia Post-op Note  Patient: Sonya Larson  Procedure(s) Performed: Procedure(s): LAPAROSCOPY DIAGNOSTIC (N/A) WHIPPLE PROCEDURE (N/A)  Patient Location: PACU  Anesthesia Type:General  Level of Consciousness: awake, oriented and sedated  Airway and Oxygen Therapy: Patient Spontanous Breathing  Post-op Pain: mild  Post-op Assessment: Post-op Vital signs reviewed, Patient's Cardiovascular Status Stable, Respiratory Function Stable, Patent Airway and No signs of Nausea or vomiting  Post-op Vital Signs: Reviewed and stable  Last Vitals:  Filed Vitals:   11/02/14 1400  BP: 102/69  Pulse: 91  Temp: 36.4 C  Resp: 15    Complications: No apparent anesthesia complications

## 2014-11-02 NOTE — Anesthesia Procedure Notes (Signed)
Procedure Name: Intubation Date/Time: 11/02/2014 7:48 AM Performed by: Suzy Bouchard Pre-anesthesia Checklist: Patient identified, Emergency Drugs available, Timeout performed, Suction available and Patient being monitored Patient Re-evaluated:Patient Re-evaluated prior to inductionOxygen Delivery Method: Circle system utilized Preoxygenation: Pre-oxygenation with 100% oxygen Intubation Type: IV induction Ventilation: Mask ventilation without difficulty Laryngoscope Size: Miller and 1 Grade View: Grade I Tube type: Oral Tube size: 7.5 mm Number of attempts: 1 Placement Confirmation: ETT inserted through vocal cords under direct vision,  breath sounds checked- equal and bilateral and positive ETCO2 Secured at: 22 cm Tube secured with: Tape Dental Injury: Teeth and Oropharynx as per pre-operative assessment

## 2014-11-02 NOTE — Transfer of Care (Signed)
Immediate Anesthesia Transfer of Care Note  Patient: Sonya Larson  Procedure(s) Performed: Procedure(s): LAPAROSCOPY DIAGNOSTIC (N/A) WHIPPLE PROCEDURE (N/A)  Patient Location: PACU  Anesthesia Type:General  Level of Consciousness: awake and alert   Airway & Oxygen Therapy: Patient Spontanous Breathing and Patient connected to nasal cannula oxygen  Post-op Assessment: Report given to PACU RN and Post -op Vital signs reviewed and stable  Post vital signs: Reviewed and stable  Complications: No apparent anesthesia complications

## 2014-11-03 ENCOUNTER — Encounter (HOSPITAL_COMMUNITY): Payer: Self-pay | Admitting: General Surgery

## 2014-11-03 LAB — COMPREHENSIVE METABOLIC PANEL
ALT: 39 U/L — AB (ref 0–35)
AST: 43 U/L — ABNORMAL HIGH (ref 0–37)
Albumin: 2.6 g/dL — ABNORMAL LOW (ref 3.5–5.2)
Alkaline Phosphatase: 56 U/L (ref 39–117)
Anion gap: 11 (ref 5–15)
BUN: 13 mg/dL (ref 6–23)
CO2: 23 mEq/L (ref 19–32)
Calcium: 8.5 mg/dL (ref 8.4–10.5)
Chloride: 107 mEq/L (ref 96–112)
Creatinine, Ser: 0.59 mg/dL (ref 0.50–1.10)
GFR calc non Af Amer: >90 mL/min (ref >90)
Glucose, Bld: 146 mg/dL — ABNORMAL HIGH (ref 70–99)
Potassium: 4.2 mEq/L (ref 3.7–5.3)
SODIUM: 141 meq/L (ref 137–147)
TOTAL PROTEIN: 4.9 g/dL — AB (ref 6.0–8.3)
Total Bilirubin: 0.4 mg/dL (ref 0.3–1.2)

## 2014-11-03 LAB — GLUCOSE, CAPILLARY
Glucose-Capillary: 183 mg/dL — ABNORMAL HIGH (ref 70–99)
Glucose-Capillary: 145 mg/dL — ABNORMAL HIGH (ref 70–99)
Glucose-Capillary: 128 mg/dL — ABNORMAL HIGH (ref 70–99)

## 2014-11-03 LAB — CBC
HCT: 28.1 % — ABNORMAL LOW (ref 36.0–46.0)
Hemoglobin: 9.2 g/dL — ABNORMAL LOW (ref 12.0–15.0)
MCH: 30.6 pg (ref 26.0–34.0)
MCHC: 32.7 g/dL (ref 30.0–36.0)
MCV: 93.4 fL (ref 78.0–100.0)
Platelets: 177 10*3/uL (ref 150–400)
RBC: 3.01 MIL/uL — ABNORMAL LOW (ref 3.87–5.11)
RDW: 14.9 % (ref 11.5–15.5)
WBC: 9.3 10*3/uL (ref 4.0–10.5)

## 2014-11-03 LAB — MAGNESIUM: Magnesium: 1.6 mg/dL (ref 1.5–2.5)

## 2014-11-03 LAB — PROTIME-INR
INR: 1.21 (ref 0.00–1.49)
PROTHROMBIN TIME: 15.5 s — AB (ref 11.6–15.2)

## 2014-11-03 LAB — PHOSPHORUS: PHOSPHORUS: 3 mg/dL (ref 2.3–4.6)

## 2014-11-03 MED ORDER — PROMETHAZINE HCL 25 MG/ML IJ SOLN
12.5000 mg | Freq: Four times a day (QID) | INTRAMUSCULAR | Status: DC | PRN
  Administered 2014-11-08 – 2014-11-09 (×2): 12.5 mg via INTRAVENOUS
  Filled 2014-11-03 (×2): qty 1

## 2014-11-03 MED ORDER — MAGNESIUM SULFATE 2 GM/50ML IV SOLN
2.0000 g | Freq: Once | INTRAVENOUS | Status: AC
  Administered 2014-11-03: 2 g via INTRAVENOUS

## 2014-11-03 MED ORDER — MAGNESIUM SULFATE 2 GM/50ML IV SOLN
INTRAVENOUS | Status: AC
  Filled 2014-11-03: qty 50

## 2014-11-03 MED ORDER — INFLUENZA VAC SPLIT QUAD 0.5 ML IM SUSY
0.5000 mL | PREFILLED_SYRINGE | INTRAMUSCULAR | Status: DC
Start: 2014-11-04 — End: 2014-11-05
  Filled 2014-11-03 (×2): qty 0.5

## 2014-11-03 MED ORDER — PNEUMOCOCCAL VAC POLYVALENT 25 MCG/0.5ML IJ INJ
0.5000 mL | INJECTION | INTRAMUSCULAR | Status: DC
  Filled 2014-11-03 (×2): qty 0.5

## 2014-11-03 NOTE — Plan of Care (Signed)
Problem: Phase I Progression Outcomes Goal: Vital signs/hemodynamically stable Outcome: Completed/Met Date Met:  11/03/14 Goal: Other Phase I Outcomes/Goals Outcome: Completed/Met Date Met:  11/03/14

## 2014-11-03 NOTE — Plan of Care (Signed)
Problem: Phase I Progression Outcomes Goal: Tubes/drains patent Outcome: Completed/Met Date Met:  11/03/14

## 2014-11-03 NOTE — Progress Notes (Signed)
Received 7.5 mg morphine last 4 hours, system downtime will not record in [facility], computer in room will not work. Syringe replaced.

## 2014-11-03 NOTE — Plan of Care (Signed)
Problem: Phase I Progression Outcomes Goal: Pain controlled with appropriate interventions Outcome: Progressing Goal: Sutures/staples intact Outcome: Completed/Met Date Met:  11/03/14

## 2014-11-03 NOTE — Progress Notes (Signed)
Spoke with Dr. Redmond Pulling regarding need for sliding scale insulin and CBG check orders. Also updated on urine output 10cc/hr for last three hours. Orders for coverage and 572mL bolus received. Will continue to closely monitor. Richarda Blade RN

## 2014-11-03 NOTE — Plan of Care (Signed)
Problem: Phase I Progression Outcomes Goal: Pain controlled with appropriate interventions Outcome: Completed/Met Date Met:  11/03/14 Goal: OOB as tolerated unless otherwise ordered Outcome: Completed/Met Date Met:  11/03/14 Goal: Incision/dressings dry and intact Outcome: Completed/Met Date Met:  11/03/14 Goal: Initial discharge plan identified Outcome: Completed/Met Date Met:  11/03/14

## 2014-11-04 LAB — GLUCOSE, CAPILLARY
GLUCOSE-CAPILLARY: 137 mg/dL — AB (ref 70–99)
GLUCOSE-CAPILLARY: 112 mg/dL — AB (ref 70–99)
GLUCOSE-CAPILLARY: 108 mg/dL — AB (ref 70–99)
Glucose-Capillary: 113 mg/dL — ABNORMAL HIGH (ref 70–99)
Glucose-Capillary: 124 mg/dL — ABNORMAL HIGH (ref 70–99)
Glucose-Capillary: 126 mg/dL — ABNORMAL HIGH (ref 70–99)
Glucose-Capillary: 109 mg/dL — ABNORMAL HIGH (ref 70–99)
Glucose-Capillary: 98 mg/dL (ref 70–99)
Glucose-Capillary: 98 mg/dL (ref 70–99)

## 2014-11-04 LAB — CBC
HCT: 28 % — ABNORMAL LOW (ref 36.0–46.0)
Hemoglobin: 9.1 g/dL — ABNORMAL LOW (ref 12.0–15.0)
MCH: 31 pg (ref 26.0–34.0)
MCHC: 32.5 g/dL (ref 30.0–36.0)
MCV: 95.2 fL (ref 78.0–100.0)
PLATELETS: 165 10*3/uL (ref 150–400)
RBC: 2.94 MIL/uL — AB (ref 3.87–5.11)
RDW: 15.1 % (ref 11.5–15.5)
WBC: 12.4 10*3/uL — ABNORMAL HIGH (ref 4.0–10.5)

## 2014-11-04 LAB — COMPREHENSIVE METABOLIC PANEL
ALT: 24 U/L (ref 0–35)
AST: 29 U/L (ref 0–37)
Albumin: 2.3 g/dL — ABNORMAL LOW (ref 3.5–5.2)
Alkaline Phosphatase: 63 U/L (ref 39–117)
Anion gap: 9 (ref 5–15)
BUN: 14 mg/dL (ref 6–23)
CO2: 26 meq/L (ref 19–32)
CREATININE: 0.73 mg/dL (ref 0.50–1.10)
Calcium: 8.8 mg/dL (ref 8.4–10.5)
Chloride: 102 mEq/L (ref 96–112)
GFR, EST NON AFRICAN AMERICAN: 88 mL/min — AB (ref >90)
GLUCOSE: 123 mg/dL — AB (ref 70–99)
Potassium: 5 mEq/L (ref 3.7–5.3)
Sodium: 137 mEq/L (ref 137–147)
Total Bilirubin: 0.8 mg/dL (ref 0.3–1.2)
Total Protein: 5.1 g/dL — ABNORMAL LOW (ref 6.0–8.3)

## 2014-11-04 MED ORDER — DEXTROSE-NACL 5-0.45 % IV SOLN
INTRAVENOUS | Status: DC
  Administered 2014-11-04: 21:00:00 via INTRAVENOUS
  Administered 2014-11-04: 100 mL/h via INTRAVENOUS
  Administered 2014-11-06 – 2014-11-07 (×2): via INTRAVENOUS

## 2014-11-04 NOTE — Progress Notes (Signed)
1 day post op, delayed entry from epic downtime.   Subjective:  Pt with nausea, very sore  Objective: Vital signs in last 24 hours: Temp:  [97.7 F (36.5 C)-99.3 F (37.4 C)] 99.3 F (37.4 C) (12/05 0400) Pulse Rate:  [79-103] 97 (12/05 0700) Resp:  [11-22] 19 (12/05 0700) BP: (95-116)/(53-70) 105/58 mmHg (12/05 0700) SpO2:  [98 %-100 %] 99 % (12/05 0700) Arterial Line BP: (79)/(73) 79/73 mmHg (12/04 0900) Last BM Date: 10/31/14  Intake/Output from previous day: 12/04 0701 - 12/05 0700 In: 2380 [I.V.:2300; NG/GT:30; IV Piggyback:50] Out: 1155 [Urine:845; Emesis/NG output:150; Drains:160] Intake/Output this shift:    General appearance: alert, cooperative and mild distress Resp: breathing comfortably GI: soft, non distended, approp tender, drains serosang  Lab Results:   Recent Labs  11/03/14 0430 11/04/14 0307  WBC 9.3 12.4*  HGB 9.2* 9.1*  HCT 28.1* 28.0*  PLT 177 165   BMET  Recent Labs  11/03/14 0430 11/04/14 0307  NA 141 137  K 4.2 5.0  CL 107 102  CO2 23 26  GLUCOSE 146* 123*  BUN 13 14  CREATININE 0.59 0.73  CALCIUM 8.5 8.8   PT/INR  Recent Labs  11/03/14 0430  LABPROT 15.5*  INR 1.21   ABG No results for input(s): PHART, HCO3 in the last 72 hours.  Invalid input(s): PCO2, PO2  Studies/Results: No results found.  Anti-infectives: Anti-infectives    Start     Dose/Rate Route Frequency Ordered Stop   11/02/14 1600  ceFAZolin (ANCEF) IVPB 2 g/50 mL premix     2 g100 mL/hr over 30 Minutes Intravenous Every 6 hours 11/02/14 1451 11/03/14 0453   11/02/14 0600  ceFAZolin (ANCEF) IVPB 2 g/50 mL premix     2 g100 mL/hr over 30 Minutes Intravenous On call to O.R. 11/01/14 1541 11/02/14 1207      Assessment/Plan: s/p Procedure(s): LAPAROSCOPY DIAGNOSTIC (N/A) WHIPPLE PROCEDURE (N/A) Continue foley due to urinary output monitoring leave NGT/NPO  LOS: 2 days    [facility] 11/03/2014

## 2014-11-04 NOTE — Progress Notes (Signed)
Patient ID: Sonya Larson, female   DOB: 22-Mar-1951, 63 y.o.   MRN: [id number] POD2 Subjective:  Nausea improved.  Still sore, but moving easier than yesterday.  Pathology back, negative margins and negative LN.    Objective: Vital signs in last 24 hours: Temp:  [97.7 F (36.5 C)-99.3 F (37.4 C)] 99.3 F (37.4 C) (12/05 0400) Pulse Rate:  [79-103] 97 (12/05 0700) Resp:  [11-22] 19 (12/05 0700) BP: (95-116)/(53-70) 105/58 mmHg (12/05 0700) SpO2:  [98 %-100 %] 99 % (12/05 0700) Arterial Line BP: (79)/(73) 79/73 mmHg (12/04 0900) Last BM Date: 10/31/14  Intake/Output from previous day: 12/04 0701 - 12/05 0700 In: 2380 [I.V.:2300; NG/GT:30; IV Piggyback:50] Out: 1155 [Urine:845; Emesis/NG output:150; Drains:160] Intake/Output this shift:    General appearance: alert, cooperative, no distress Resp: breathing comfortably GI: soft, non distended, approp tender, drains serosang, onQ still with local in it.    Lab Results:   Recent Labs  11/03/14 0430 11/04/14 0307  WBC 9.3 12.4*  HGB 9.2* 9.1*  HCT 28.1* 28.0*  PLT 177 165   BMET  Recent Labs  11/03/14 0430 11/04/14 0307  NA 141 137  K 4.2 5.0  CL 107 102  CO2 23 26  GLUCOSE 146* 123*  BUN 13 14  CREATININE 0.59 0.73  CALCIUM 8.5 8.8   PT/INR  Recent Labs  11/03/14 0430  LABPROT 15.5*  INR 1.21   ABG No results for input(s): PHART, HCO3 in the last 72 hours.  Invalid input(s): PCO2, PO2  Studies/Results: No results found.  Anti-infectives: Anti-infectives    Start     Dose/Rate Route Frequency Ordered Stop   11/02/14 1600  ceFAZolin (ANCEF) IVPB 2 g/50 mL premix     2 g100 mL/hr over 30 Minutes Intravenous Every 6 hours 11/02/14 1451 11/03/14 0453   11/02/14 0600  ceFAZolin (ANCEF) IVPB 2 g/50 mL premix     2 g100 mL/hr over 30 Minutes Intravenous On call to O.R. 11/01/14 1541 11/02/14 1207      Assessment/Plan: s/p Procedure(s): LAPAROSCOPY DIAGNOSTIC (N/A) WHIPPLE PROCEDURE (N/A) Leave  foley for urine output monitoring Transfer to floor Leave NGT since still putting out very bilious material. OK to have a few ice chips. Take K out of IVF since K up to 5.0 Hold on flu/pneumonia shot until less critically ill.  Will give prior to d/c.   Mild hyperglycemia.    LOS: 2 days    [facility] 11/03/2014

## 2014-11-05 LAB — COMPREHENSIVE METABOLIC PANEL
ALK PHOS: 75 U/L (ref 39–117)
ALT: 17 U/L (ref 0–35)
ANION GAP: 9 (ref 5–15)
AST: 18 U/L (ref 0–37)
Albumin: 2 g/dL — ABNORMAL LOW (ref 3.5–5.2)
BUN: 7 mg/dL (ref 6–23)
CO2: 27 mEq/L (ref 19–32)
CREATININE: 0.58 mg/dL (ref 0.50–1.10)
Calcium: 8.5 mg/dL (ref 8.4–10.5)
Chloride: 103 mEq/L (ref 96–112)
GFR calc non Af Amer: >90 mL/min (ref >90)
GLUCOSE: 97 mg/dL (ref 70–99)
POTASSIUM: 3.5 meq/L — AB (ref 3.7–5.3)
Sodium: 139 mEq/L (ref 137–147)
TOTAL PROTEIN: 5 g/dL — AB (ref 6.0–8.3)
Total Bilirubin: 0.5 mg/dL (ref 0.3–1.2)

## 2014-11-05 LAB — GLUCOSE, CAPILLARY
GLUCOSE-CAPILLARY: 123 mg/dL — AB (ref 70–99)
GLUCOSE-CAPILLARY: 110 mg/dL — AB (ref 70–99)
Glucose-Capillary: 110 mg/dL — ABNORMAL HIGH (ref 70–99)
Glucose-Capillary: 113 mg/dL — ABNORMAL HIGH (ref 70–99)
Glucose-Capillary: 118 mg/dL — ABNORMAL HIGH (ref 70–99)
Glucose-Capillary: 96 mg/dL (ref 70–99)

## 2014-11-05 LAB — CBC
HEMATOCRIT: 24 % — AB (ref 36.0–46.0)
HEMOGLOBIN: 8.1 g/dL — AB (ref 12.0–15.0)
MCH: 32 pg (ref 26.0–34.0)
MCHC: 33.8 g/dL (ref 30.0–36.0)
MCV: 94.9 fL (ref 78.0–100.0)
Platelets: 163 10*3/uL (ref 150–400)
RBC: 2.53 MIL/uL — ABNORMAL LOW (ref 3.87–5.11)
RDW: 14.4 % (ref 11.5–15.5)
WBC: 6.9 10*3/uL (ref 4.0–10.5)

## 2014-11-05 MED ORDER — INFLUENZA VAC SPLIT QUAD 0.5 ML IM SUSY
0.5000 mL | PREFILLED_SYRINGE | INTRAMUSCULAR | Status: AC
  Administered 2014-11-06: 0.5 mL via INTRAMUSCULAR
  Filled 2014-11-05: qty 0.5

## 2014-11-05 MED ORDER — HYDROMORPHONE 0.3 MG/ML IV SOLN
INTRAVENOUS | Status: AC
  Administered 2014-11-05: 07:00:00
  Filled 2014-11-05: qty 25

## 2014-11-05 MED ORDER — PNEUMOCOCCAL VAC POLYVALENT 25 MCG/0.5ML IJ INJ
0.5000 mL | INJECTION | INTRAMUSCULAR | Status: AC
  Administered 2014-11-06: 0.5 mL via INTRAMUSCULAR
  Filled 2014-11-05: qty 0.5

## 2014-11-05 NOTE — Progress Notes (Signed)
Patient ID: Sonya Larson, female   DOB: 1951/03/05, 63 y.o.   MRN: [id number] POD3 Subjective:  Doing a bit better this AM.  No flatus yet.  Objective: Vital signs in last 24 hours: Temp:  [97.6 F (36.4 C)-99.8 F (37.7 C)] 99 F (37.2 C) (12/06 0640) Pulse Rate:  [81-103] 81 (12/06 0640) Resp:  [15-27] 15 (12/06 0640) BP: (105-117)/(58-76) 107/71 mmHg (12/06 0640) SpO2:  [98 %-100 %] 99 % (12/06 0640) Weight:  [172 lb 6.4 oz (78.2 kg)] 172 lb 6.4 oz (78.2 kg) (12/05 1110) Last BM Date: 10/31/14  Intake/Output from previous day: 12/05 0701 - 12/06 0700 In: 120 [I.V.:120] Out: 2075 [Urine:1675; Emesis/NG output:175; Drains:225] Intake/Output this shift: Total I/O In: -  Out: 1287 [Urine:1625; Drains:105]  General appearance: alert, cooperative, no distress Resp: breathing comfortably GI: soft, non distended, approp tender, drains serosang, onQ still with local in it.    Lab Results:   Recent Labs  11/03/14 0430 11/04/14 0307  WBC 9.3 12.4*  HGB 9.2* 9.1*  HCT 28.1* 28.0*  PLT 177 165   BMET  Recent Labs  11/03/14 0430 11/04/14 0307  NA 141 137  K 4.2 5.0  CL 107 102  CO2 23 26  GLUCOSE 146* 123*  BUN 13 14  CREATININE 0.59 0.73  CALCIUM 8.5 8.8   PT/INR  Recent Labs  11/03/14 0430  LABPROT 15.5*  INR 1.21   ABG No results for input(s): PHART, HCO3 in the last 72 hours.  Invalid input(s): PCO2, PO2  Studies/Results: No results found.  Anti-infectives: Anti-infectives    Start     Dose/Rate Route Frequency Ordered Stop   11/02/14 1600  ceFAZolin (ANCEF) IVPB 2 g/50 mL premix     2 g100 mL/hr over 30 Minutes Intravenous Every 6 hours 11/02/14 1451 11/03/14 0453   11/02/14 0600  ceFAZolin (ANCEF) IVPB 2 g/50 mL premix     2 g100 mL/hr over 30 Minutes Intravenous On call to O.R. 11/01/14 1541 11/02/14 1207      Assessment/Plan: s/p Procedure(s): LAPAROSCOPY DIAGNOSTIC (N/A) WHIPPLE PROCEDURE (N/A) D/C foley, NGT Decrease IVF Await  labs Ambulate Hold on flu/pneumonia shot until less critically ill.  Will give prior to d/c.   Mild hyperglycemia.    LOS: 3 days    [facility] 11/03/2014

## 2014-11-06 DIAGNOSIS — C250 Malignant neoplasm of head of pancreas: Secondary | ICD-10-CM | POA: Diagnosis not present

## 2014-11-06 LAB — GLUCOSE, CAPILLARY
GLUCOSE-CAPILLARY: 109 mg/dL — AB (ref 70–99)
GLUCOSE-CAPILLARY: 109 mg/dL — AB (ref 70–99)
GLUCOSE-CAPILLARY: 121 mg/dL — AB (ref 70–99)
Glucose-Capillary: 105 mg/dL — ABNORMAL HIGH (ref 70–99)
Glucose-Capillary: 118 mg/dL — ABNORMAL HIGH (ref 70–99)
Glucose-Capillary: 143 mg/dL — ABNORMAL HIGH (ref 70–99)
Glucose-Capillary: 111 mg/dL — ABNORMAL HIGH (ref 70–99)

## 2014-11-06 LAB — CBC
HEMATOCRIT: 24.4 % — AB (ref 36.0–46.0)
HEMOGLOBIN: 8.1 g/dL — AB (ref 12.0–15.0)
MCH: 30.5 pg (ref 26.0–34.0)
MCHC: 33.2 g/dL (ref 30.0–36.0)
MCV: 91.7 fL (ref 78.0–100.0)
Platelets: 213 10*3/uL (ref 150–400)
RBC: 2.66 MIL/uL — ABNORMAL LOW (ref 3.87–5.11)
RDW: 14.2 % (ref 11.5–15.5)
WBC: 5.2 10*3/uL (ref 4.0–10.5)

## 2014-11-06 LAB — COMPREHENSIVE METABOLIC PANEL
ALBUMIN: 2.2 g/dL — AB (ref 3.5–5.2)
ALK PHOS: 72 U/L (ref 39–117)
ALT: 14 U/L (ref 0–35)
ANION GAP: 11 (ref 5–15)
AST: 15 U/L (ref 0–37)
BILIRUBIN TOTAL: 0.5 mg/dL (ref 0.3–1.2)
BUN: 6 mg/dL (ref 6–23)
CO2: 28 mEq/L (ref 19–32)
Calcium: 8.7 mg/dL (ref 8.4–10.5)
Chloride: 102 mEq/L (ref 96–112)
Creatinine, Ser: 0.59 mg/dL (ref 0.50–1.10)
GFR calc Af Amer: >90 mL/min (ref >90)
GFR calc non Af Amer: >90 mL/min (ref >90)
GLUCOSE: 101 mg/dL — AB (ref 70–99)
POTASSIUM: 3.3 meq/L — AB (ref 3.7–5.3)
Sodium: 141 mEq/L (ref 137–147)
Total Protein: 5.5 g/dL — ABNORMAL LOW (ref 6.0–8.3)

## 2014-11-06 NOTE — Plan of Care (Signed)
Problem: Phase III Progression Outcomes Goal: Voiding independently Outcome: Completed/Met Date Met:  11/06/14 Goal: Nasogastric tube discontinued Outcome: Not Applicable Date Met:  66/06/30 Goal: Demonstrates TCDB, IS independently Outcome: Completed/Met Date Met:  11/06/14

## 2014-11-06 NOTE — Progress Notes (Signed)
Patient had flu and pneumonia vaccines ordered for administration at 10:00 am.  VSS and patient hemodynamically stable post surgical procedure on 12/3.  Vaccines were administered as ordered.  After administration, RN noted a comment at the bottom of MD's rounding note stating to "Hold flu/pneumonia shot until less critically ill. Will administer prior to d/c."  No additional care order or instruction noted about this in chart. Patient tolerated both vaccines well with no adverse reactions.  SZP completed for nursing. Will continue to monitor.

## 2014-11-06 NOTE — Progress Notes (Signed)
Patient ID: NEHEMIAH MONTEE, female   DOB: 05-31-1951, 63 y.o.   MRN: [id number] POD4  Subjective:  One episode of nausea this AM.  Tolerated clears in the past.  No flatus  Objective: Vital signs in last 24 hours: Temp:  [96.8 F (36 C)-99.3 F (37.4 C)] 98.5 F (36.9 C) (12/07 9735) Pulse Rate:  [74-94] 74 (12/07 0633) Resp:  [16-22] 19 (12/07 0800) BP: (122-130)/(65-78) 122/78 mmHg (12/07 0633) SpO2:  [94 %-98 %] 97 % (12/07 0800) Last BM Date: 10/31/14  Intake/Output from previous day: 12/06 0701 - 12/07 0700 In: 1695 [P.O.:560; I.V.:860] Out: 1648 [Urine:1525; Drains:123] Intake/Output this shift:    General appearance: alert, cooperative, no distress. In chair Resp: breathing comfortably GI: soft, non distended, approp tender, drains serosang, onQ empty  Lab Results:   Recent Labs  11/05/14 0816 11/06/14 0755  WBC 6.9 5.2  HGB 8.1* 8.1*  HCT 24.0* 24.4*  PLT 163 213   BMET  Recent Labs  11/05/14 0816 11/06/14 0755  NA 139 141  K 3.5* 3.3*  CL 103 102  CO2 27 28  GLUCOSE 97 101*  BUN 7 6  CREATININE 0.58 0.59  CALCIUM 8.5 8.7   PT/INR No results for input(s): LABPROT, INR in the last 72 hours. ABG No results for input(s): PHART, HCO3 in the last 72 hours.  Invalid input(s): PCO2, PO2  Studies/Results: No results found.  Anti-infectives: Anti-infectives    Start     Dose/Rate Route Frequency Ordered Stop   11/02/14 1600  ceFAZolin (ANCEF) IVPB 2 g/50 mL premix     2 g100 mL/hr over 30 Minutes Intravenous Every 6 hours 11/02/14 1451 11/03/14 0453   11/02/14 0600  ceFAZolin (ANCEF) IVPB 2 g/50 mL premix     2 g100 mL/hr over 30 Minutes Intravenous On call to O.R. 11/01/14 1541 11/02/14 1207      Assessment/Plan: s/p Procedure(s): LAPAROSCOPY DIAGNOSTIC (N/A) WHIPPLE PROCEDURE (N/A) D/c dressings. Stay on clear liquids.   Await labs Continue PCA. Ambulate Hold on flu/pneumonia shot until less critically ill.  Will give prior to d/c.    Mild hyperglycemia.    LOS: 4 days    [facility] 11/03/2014

## 2014-11-06 NOTE — Progress Notes (Signed)
UR completed.  Inis Borneman, RN BSN [facility] CCM Trauma/Neuro ICU Case Manager [phone]  

## 2014-11-07 LAB — GLUCOSE, CAPILLARY
GLUCOSE-CAPILLARY: 119 mg/dL — AB (ref 70–99)
GLUCOSE-CAPILLARY: 121 mg/dL — AB (ref 70–99)
GLUCOSE-CAPILLARY: 107 mg/dL — AB (ref 70–99)
GLUCOSE-CAPILLARY: 126 mg/dL — AB (ref 70–99)
Glucose-Capillary: 113 mg/dL — ABNORMAL HIGH (ref 70–99)

## 2014-11-07 LAB — COMPREHENSIVE METABOLIC PANEL
ALT: 13 U/L (ref 0–35)
ANION GAP: 11 (ref 5–15)
AST: 16 U/L (ref 0–37)
Albumin: 2.1 g/dL — ABNORMAL LOW (ref 3.5–5.2)
Alkaline Phosphatase: 69 U/L (ref 39–117)
BUN: 5 mg/dL — AB (ref 6–23)
CALCIUM: 8.6 mg/dL (ref 8.4–10.5)
CHLORIDE: 102 meq/L (ref 96–112)
CO2: 28 meq/L (ref 19–32)
CREATININE: 0.62 mg/dL (ref 0.50–1.10)
GFR calc non Af Amer: >90 mL/min (ref >90)
GLUCOSE: 121 mg/dL — AB (ref 70–99)
Potassium: 3.4 mEq/L — ABNORMAL LOW (ref 3.7–5.3)
Sodium: 141 mEq/L (ref 137–147)
Total Bilirubin: 0.4 mg/dL (ref 0.3–1.2)
Total Protein: 5.3 g/dL — ABNORMAL LOW (ref 6.0–8.3)

## 2014-11-07 LAB — CBC
HEMATOCRIT: 23.6 % — AB (ref 36.0–46.0)
HEMOGLOBIN: 8 g/dL — AB (ref 12.0–15.0)
MCH: 31.6 pg (ref 26.0–34.0)
MCHC: 33.9 g/dL (ref 30.0–36.0)
MCV: 93.3 fL (ref 78.0–100.0)
PLATELETS: 205 10*3/uL (ref 150–400)
RBC: 2.53 MIL/uL — AB (ref 3.87–5.11)
RDW: 14.1 % (ref 11.5–15.5)
WBC: 4.9 10*3/uL (ref 4.0–10.5)

## 2014-11-07 MED ORDER — PANTOPRAZOLE SODIUM 40 MG PO TBEC
40.0000 mg | DELAYED_RELEASE_TABLET | Freq: Every day | ORAL | Status: DC
  Administered 2014-11-07: 40 mg via ORAL
  Filled 2014-11-07: qty 1

## 2014-11-07 MED ORDER — KCL IN DEXTROSE-NACL 20-5-0.45 MEQ/L-%-% IV SOLN
INTRAVENOUS | Status: AC
  Administered 2014-11-07 – 2014-11-10 (×4): via INTRAVENOUS
  Filled 2014-11-07 (×7): qty 1000

## 2014-11-07 NOTE — Progress Notes (Signed)
Patient ID: Sonya Larson, female   DOB: July 05, 1951, 63 y.o.   MRN: [id number] POD4  Subjective:  Continued to have some nausea.  Pain improved.  Objective: Vital signs in last 24 hours: Temp:  [98.6 F (37 C)-99.8 F (37.7 C)] 99.3 F (37.4 C) (12/08 1310) Pulse Rate:  [77-82] 81 (12/08 1310) Resp:  [17-22] 17 (12/08 1310) BP: (129-138)/(70-80) 129/78 mmHg (12/08 1310) SpO2:  [94 %-98 %] 97 % (12/08 1310) Last BM Date: 10/31/14  Intake/Output from previous day: 12/07 0701 - 12/08 0700 In: 2517.5 [P.O.:120; I.V.:2397.5] Out: 1233 [Urine:1000; Drains:233] Intake/Output this shift:    General appearance: alert, cooperative, no distress. Resp: breathing comfortably GI: soft, non distended, approp tender, no erythema or drainage of incision.   Lab Results:   Recent Labs  11/06/14 0755 11/07/14 0631  WBC 5.2 4.9  HGB 8.1* 8.0*  HCT 24.4* 23.6*  PLT 213 205   BMET  Recent Labs  11/06/14 0755 11/07/14 0631  NA 141 141  K 3.3* 3.4*  CL 102 102  CO2 28 28  GLUCOSE 101* 121*  BUN 6 5*  CREATININE 0.59 0.62  CALCIUM 8.7 8.6   PT/INR No results for input(s): LABPROT, INR in the last 72 hours. ABG No results for input(s): PHART, HCO3 in the last 72 hours.  Invalid input(s): PCO2, PO2  Studies/Results: No results found.  Anti-infectives: Anti-infectives    Start     Dose/Rate Route Frequency Ordered Stop   11/02/14 1600  ceFAZolin (ANCEF) IVPB 2 g/50 mL premix     2 g100 mL/hr over 30 Minutes Intravenous Every 6 hours 11/02/14 1451 11/03/14 0453   11/02/14 0600  ceFAZolin (ANCEF) IVPB 2 g/50 mL premix     2 g100 mL/hr over 30 Minutes Intravenous On call to O.R. 11/01/14 1541 11/02/14 1207      Assessment/Plan: s/p Procedure(s): LAPAROSCOPY DIAGNOSTIC (N/A) WHIPPLE PROCEDURE (N/A)  Stay on clear liquids.   D/c PCA Ambulate Hold on flu/pneumonia shot until less critically ill.  Will give prior to d/c.   Mild hyperglycemia.    LOS: 5 days     [facility] 11/03/2014

## 2014-11-07 NOTE — Care Management Note (Signed)
  Page 1 of 1   11/16/2014     1:19:12 PM CARE MANAGEMENT NOTE 11/16/2014  Patient:  MELANY, WIESMAN   Account Number:  [id number]  Date Initiated:  11/02/2014  Documentation initiated by:  [facility]  Subjective/Objective Assessment:   post op abd surgery for pancreatic tumor.     Action/Plan:   Anticipated DC Date:  11/17/2014   Anticipated DC Plan:  [place]  CM consult      Choice offered to / List presented to:  C-1 Patient           [place].   Status of service:  In process, will continue to follow Medicare Important Message given?  YES (If response is "NO", the following Medicare IM given date fields will be blank) Date Medicare IM given:  11/16/2014 Medicare IM given by:  Magdalen Spatz Date Additional Medicare IM given:  11/15/2014 Additional Medicare IM given by:  Magdalen Spatz  Discharge Disposition:    Per UR Regulation:  Reviewed for med. necessity/level of care/duration of stay  If discussed at [place] of Stay Meetings, dates discussed:   11/07/2014  11/09/2014  11/14/2014  11/16/2014    Comments:  ContactMarland Kitchen  Kipp Laurence [phone]  [phone]   11-16-14  Discussed home TNA with patient and husband . [facility] consulted for home TNA . Magdalen Spatz RN SBN [phone]  11/06/2014 Sandi Mariscal, RN BSN [facility] CCM [phone]--Pt remains on acute unit. NGT d/c yesterday and clear liq diet started.  Await toleration of diet for d/c home.

## 2014-11-07 NOTE — Progress Notes (Signed)
Patient started shivering, Temp 99-100, Md made aware,. No new order noted.

## 2014-11-08 LAB — COMPREHENSIVE METABOLIC PANEL
ALT: 18 U/L (ref 0–35)
ANION GAP: 14 (ref 5–15)
AST: 28 U/L (ref 0–37)
Albumin: 2 g/dL — ABNORMAL LOW (ref 3.5–5.2)
Alkaline Phosphatase: 79 U/L (ref 39–117)
BUN: 4 mg/dL — ABNORMAL LOW (ref 6–23)
CALCIUM: 8.7 mg/dL (ref 8.4–10.5)
CO2: 26 mEq/L (ref 19–32)
CREATININE: 0.6 mg/dL (ref 0.50–1.10)
Chloride: 103 mEq/L (ref 96–112)
GLUCOSE: 110 mg/dL — AB (ref 70–99)
Potassium: 3.4 mEq/L — ABNORMAL LOW (ref 3.7–5.3)
Sodium: 143 mEq/L (ref 137–147)
TOTAL PROTEIN: 5.4 g/dL — AB (ref 6.0–8.3)
Total Bilirubin: 0.4 mg/dL (ref 0.3–1.2)

## 2014-11-08 LAB — GLUCOSE, CAPILLARY
GLUCOSE-CAPILLARY: 108 mg/dL — AB (ref 70–99)
GLUCOSE-CAPILLARY: 115 mg/dL — AB (ref 70–99)
GLUCOSE-CAPILLARY: 99 mg/dL (ref 70–99)
Glucose-Capillary: 101 mg/dL — ABNORMAL HIGH (ref 70–99)
Glucose-Capillary: 112 mg/dL — ABNORMAL HIGH (ref 70–99)
Glucose-Capillary: 116 mg/dL — ABNORMAL HIGH (ref 70–99)

## 2014-11-08 LAB — URINALYSIS, ROUTINE W REFLEX MICROSCOPIC
Bilirubin Urine: NEGATIVE
Glucose, UA: NEGATIVE mg/dL
Hgb urine dipstick: NEGATIVE
KETONES UR: NEGATIVE mg/dL
Leukocytes, UA: NEGATIVE
NITRITE: NEGATIVE
PH: 7 (ref 5.0–8.0)
Protein, ur: NEGATIVE mg/dL
SPECIFIC GRAVITY, URINE: 1.016 (ref 1.005–1.030)
Urobilinogen, UA: 1 mg/dL (ref 0.0–1.0)

## 2014-11-08 LAB — TYPE AND SCREEN
ABO/RH(D): O POS
ANTIBODY SCREEN: NEGATIVE
UNIT DIVISION: 0
UNIT DIVISION: 0
Unit division: 0
Unit division: 0

## 2014-11-08 LAB — CBC
HCT: 22.8 % — ABNORMAL LOW (ref 36.0–46.0)
Hemoglobin: 7.6 g/dL — ABNORMAL LOW (ref 12.0–15.0)
MCH: 30.4 pg (ref 26.0–34.0)
MCHC: 33.3 g/dL (ref 30.0–36.0)
MCV: 91.2 fL (ref 78.0–100.0)
Platelets: 227 10*3/uL (ref 150–400)
RBC: 2.5 MIL/uL — AB (ref 3.87–5.11)
RDW: 14.2 % (ref 11.5–15.5)
WBC: 5.6 10*3/uL (ref 4.0–10.5)

## 2014-11-08 LAB — AMYLASE, PERITONEAL FLUID: Amylase, peritoneal fluid: 1612 U/L

## 2014-11-08 MED ORDER — PANTOPRAZOLE SODIUM 40 MG IV SOLR
40.0000 mg | INTRAVENOUS | Status: DC
  Administered 2014-11-08 – 2014-11-20 (×13): 40 mg via INTRAVENOUS
  Filled 2014-11-08 (×16): qty 40

## 2014-11-08 MED ORDER — HYDROMORPHONE HCL 1 MG/ML IJ SOLN
0.5000 mg | INTRAMUSCULAR | Status: DC | PRN
  Administered 2014-11-08: 1 mg via INTRAVENOUS
  Filled 2014-11-08: qty 1

## 2014-11-08 NOTE — Clinical Documentation Improvement (Signed)
Hgb / Hct as below.  Please identify if any additional clinical conditions are Larson related to the abnormal lab values: Component      Hemoglobin HCT  Latest Ref Rng      12.0 - 15.0 g/dL 36.0 - 46.0 %  11/02/2014     3:30 PM 10.5 (L) 31.3 (L)  11/03/2014     4:30 AM 9.2 (L) 28.1 (L)  11/04/2014     3:07 AM 9.1 (L) 28.0 (L)  11/05/2014     8:16 AM 8.1 (L) 24.0 (L)  11/06/2014     7:55 AM 8.1 (L) 24.4 (L)  11/07/2014     6:31 AM 8.0 (L) 23.6 (L)  11/08/2014     3:27 AM 7.6 (L) 22.8 (L)   Possible Clinical Conditions: -Acute blood loss anemia -Acute blood loss anemia on chronic anemia (if Larson, please specify cause / type of chronic anemia) -Chronic anemia only (please specify cause / type) -Other condition -Sonya Larson  Thank you, Mateo Flow, RN [phone] Clinical Documentation Specialist

## 2014-11-08 NOTE — Progress Notes (Signed)
Patient ID: Sonya Larson, female   DOB: 03/17/1951, 63 y.o.   MRN: [id number] POD 6  Subjective: Pain continues to improve.  However, pt still nauseated and threw up last night.  She has associated the nausea with receiving pain medication.  She also had temp of 100 with an episode of rigors.    Objective: Vital signs in last 24 hours: Temp:  [98.8 F (37.1 C)-100 F (37.8 C)] 98.8 F (37.1 C) (12/09 0652) Pulse Rate:  [78-110] 78 (12/09 0652) Resp:  [17-18] 17 (12/09 0652) BP: (129-167)/(74-90) 132/74 mmHg (12/09 0652) SpO2:  [95 %-97 %] 97 % (12/09 0652) Last BM Date: 10/31/14  Intake/Output from previous day: 12/08 0701 - 12/09 0700 In: 783.3 [I.V.:783.3] Out: 1268 [Urine:700; Emesis/NG output:500; Drains:68] Intake/Output this shift:    General appearance: alert, cooperative, no distress. Resp: breathing comfortably GI: soft, non distended, approp tender, no erythema or drainage of incision.   Drain Number one with some murkiness to it.    Lab Results:   Recent Labs  11/07/14 0631 11/08/14 0327  WBC 4.9 5.6  HGB 8.0* 7.6*  HCT 23.6* 22.8*  PLT 205 227   BMET  Recent Labs  11/07/14 0631 11/08/14 0327  NA 141 143  K 3.4* 3.4*  CL 102 103  CO2 28 26  GLUCOSE 121* 110*  BUN 5* 4*  CREATININE 0.62 0.60  CALCIUM 8.6 8.7   PT/INR No results for input(s): LABPROT, INR in the last 72 hours. ABG No results for input(s): PHART, HCO3 in the last 72 hours.  Invalid input(s): PCO2, PO2  Studies/Results: No results found.  Anti-infectives: Anti-infectives    Start     Dose/Rate Route Frequency Ordered Stop   11/02/14 1600  ceFAZolin (ANCEF) IVPB 2 g/50 mL premix     2 g100 mL/hr over 30 Minutes Intravenous Every 6 hours 11/02/14 1451 11/03/14 0453   11/02/14 0600  ceFAZolin (ANCEF) IVPB 2 g/50 mL premix     2 g100 mL/hr over 30 Minutes Intravenous On call to O.R. 11/01/14 1541 11/02/14 1207      Assessment/Plan: s/p Procedure(s): LAPAROSCOPY  DIAGNOSTIC (N/A) WHIPPLE PROCEDURE (N/A) Go back to sips of clears.    Switch to dilaudid from morphine Send drain number 1 for amylase May need to start antibiotics.   If no improvement in nausea overnight, will start TNA tomorrow.   Ambulate Hold on flu/pneumonia shot until less critically ill.  Will give prior to d/c.   Mild hyperglycemia.    LOS: 6 days    [facility] 11/03/2014

## 2014-11-08 NOTE — Plan of Care (Signed)
Problem: Phase II Progression Outcomes Goal: Discharge plan established Outcome: Progressing Goal: Tolerating diet Outcome: Progressing Goal: Other Phase II Outcomes/Goals Outcome: Progressing  Problem: Phase III Progression Outcomes Goal: Pain controlled on oral analgesia Outcome: Progressing Goal: Activity at appropriate level-compared to baseline (UP IN CHAIR FOR HEMODIALYSIS)  Outcome: Progressing Goal: IV changed to normal saline lock Outcome: Not Applicable Date Met:  99/14/44

## 2014-11-09 LAB — GLUCOSE, CAPILLARY
GLUCOSE-CAPILLARY: 102 mg/dL — AB (ref 70–99)
GLUCOSE-CAPILLARY: 88 mg/dL (ref 70–99)
GLUCOSE-CAPILLARY: 92 mg/dL (ref 70–99)
Glucose-Capillary: 104 mg/dL — ABNORMAL HIGH (ref 70–99)
Glucose-Capillary: 105 mg/dL — ABNORMAL HIGH (ref 70–99)
Glucose-Capillary: 106 mg/dL — ABNORMAL HIGH (ref 70–99)
Glucose-Capillary: 88 mg/dL (ref 70–99)

## 2014-11-09 LAB — CREATININE, SERUM
CREATININE: 0.62 mg/dL (ref 0.50–1.10)
GFR calc Af Amer: >90 mL/min (ref >90)

## 2014-11-09 MED ORDER — VANCOMYCIN HCL IN DEXTROSE 750-5 MG/150ML-% IV SOLN
750.0000 mg | Freq: Two times a day (BID) | INTRAVENOUS | Status: DC
  Administered 2014-11-09 – 2014-11-20 (×23): 750 mg via INTRAVENOUS
  Filled 2014-11-09 (×25): qty 150

## 2014-11-09 MED ORDER — PIPERACILLIN-TAZOBACTAM 3.375 G IVPB
3.3750 g | Freq: Three times a day (TID) | INTRAVENOUS | Status: DC
Start: 2014-11-09 — End: 2014-11-16
  Administered 2014-11-09 – 2014-11-15 (×21): 3.375 g via INTRAVENOUS
  Filled 2014-11-09 (×27): qty 50

## 2014-11-09 NOTE — Progress Notes (Signed)
Patient ID: Sonya Larson, female   DOB: May 09, 1951, 63 y.o.   MRN: [id number] POD 6  Subjective: Some improvement in nausea.  Amylase elevated in drain.  +4 loose stools.    Objective: Vital signs in last 24 hours: Temp:  [98.4 F (36.9 C)-99.1 F (37.3 C)] 98.4 F (36.9 C) (12/10 0642) Pulse Rate:  [81-87] 87 (12/10 0642) Resp:  [15-17] 17 (12/10 0642) BP: (129-134)/(72-76) 131/72 mmHg (12/10 0642) SpO2:  [94 %-98 %] 98 % (12/10 0642) Last BM Date: 11/08/14  Intake/Output from previous day: 12/09 0701 - 12/10 0700 In: 1566.7 [I.V.:1566.7] Out: 408 [Urine:400; Drains:8] Intake/Output this shift:    General appearance: alert, cooperative, no distress. Resp: breathing comfortably GI: soft, non distended, approp tender, no erythema or drainage of incision.   Drain Number one with some murkiness to it.    Lab Results:   Recent Labs  11/07/14 0631 11/08/14 0327  WBC 4.9 5.6  HGB 8.0* 7.6*  HCT 23.6* 22.8*  PLT 205 227   BMET  Recent Labs  11/07/14 0631 11/08/14 0327 11/09/14 0405  NA 141 143  --   K 3.4* 3.4*  --   CL 102 103  --   CO2 28 26  --   GLUCOSE 121* 110*  --   BUN 5* 4*  --   CREATININE 0.62 0.60 0.62  CALCIUM 8.6 8.7  --    PT/INR No results for input(s): LABPROT, INR in the last 72 hours. ABG No results for input(s): PHART, HCO3 in the last 72 hours.  Invalid input(s): PCO2, PO2  Studies/Results: No results found.  Anti-infectives: Anti-infectives    Start     Dose/Rate Route Frequency Ordered Stop   11/09/14 0800  vancomycin (VANCOCIN) IVPB 750 mg/150 ml premix     750 mg150 mL/hr over 60 Minutes Intravenous Every 12 hours 11/09/14 0707     11/09/14 0700  piperacillin-tazobactam (ZOSYN) IVPB 3.375 g     3.375 g12.5 mL/hr over 240 Minutes Intravenous 3 times per day 11/09/14 0655     11/02/14 1600  ceFAZolin (ANCEF) IVPB 2 g/50 mL premix     2 g100 mL/hr over 30 Minutes Intravenous Every 6 hours 11/02/14 1451 11/03/14 0453   11/02/14 0600  ceFAZolin (ANCEF) IVPB 2 g/50 mL premix     2 g100 mL/hr over 30 Minutes Intravenous On call to O.R. 11/01/14 1541 11/02/14 1207      Assessment/Plan: s/p Procedure(s): LAPAROSCOPY DIAGNOSTIC (N/A) WHIPPLE PROCEDURE (N/A) Sips of clears.    Start antibiotics.   Acute blood loss anemia, dilutional anemia, and anemia of chronic disease.   Ambulate Hold on flu/pneumonia shot until less critically ill.  Will give prior to d/c.   Mild hyperglycemia.     LOS: 7 days    [facility] 11/03/2014

## 2014-11-09 NOTE — Progress Notes (Signed)
ANTIBIOTIC CONSULT NOTE - INITIAL  Pharmacy Consult for Vancomycin  Indication: Wound infection  No Known Allergies  Patient Measurements: Height: 5\' 3"  (160 cm) Weight: 172 lb 6.4 oz (78.2 kg) IBW/kg (Calculated) : 52.4  Vital Signs: Temp: 98.4 F (36.9 C) (12/10 0642) Temp Source: Oral (12/09 2053) BP: 131/72 mmHg (12/10 0642) Pulse Rate: 87 (12/10 0642)  Labs:  Recent Labs  11/06/14 0755 11/07/14 0631 11/08/14 0327 11/09/14 0405  WBC 5.2 4.9 5.6  --   HGB 8.1* 8.0* 7.6*  --   PLT 213 205 227  --   CREATININE 0.59 0.62 0.60 0.62   Estimated Creatinine Clearance: 69.4 mL/min (by C-G formula based on Cr of 0.62).  Medical History: Past Medical History  Diagnosis Date  . Chicken pox   . Depression   . Hypertension   . Hyperlipidemia   . Polyp of colon   . Shingles JUNE 2015    RIGHT  SHOULDER  . Anxiety     since diagnosis  . Allergy   . Pancreatic cancer 07/12/14    Adenocarcinoma. Chemo/ radiation completed early 9'15-Dr. Sherril follows  . Hx of radiation therapy 08/10/14-09/21/14    pancreas 50.4Gy/53fx  . Asthma     exercise induced, seasonal   . GERD (gastroesophageal reflux disease)     uses TUM  PRN, since chemo-   . Arthritis     mild, hips     Assessment: 63 y/o F s/p whipple procedure, having some mild temps, WBC WNL, starting anti-biotics for wound infection, other labs as above.   Goal of Therapy:  Vancomycin trough level 15-20 mcg/ml  Plan:  -Vancomycin 750 mg IV q12h -Zosyn per MD -Trend WBC, temp, renal function  -Drug levels as indicated   Narda Bonds 11/09/2014,7:04 AM

## 2014-11-10 LAB — GLUCOSE, CAPILLARY
GLUCOSE-CAPILLARY: 106 mg/dL — AB (ref 70–99)
GLUCOSE-CAPILLARY: 106 mg/dL — AB (ref 70–99)
GLUCOSE-CAPILLARY: 112 mg/dL — AB (ref 70–99)
Glucose-Capillary: 108 mg/dL — ABNORMAL HIGH (ref 70–99)
Glucose-Capillary: 117 mg/dL — ABNORMAL HIGH (ref 70–99)
Glucose-Capillary: 129 mg/dL — ABNORMAL HIGH (ref 70–99)

## 2014-11-10 NOTE — Progress Notes (Signed)
8 Days Post-Op  Subjective: She is feeling better today. Had a couple of loose BM's.  Nausea resolved. Wants to advance diet a little  Objective: Vital signs in last 24 hours: Temp:  [98.5 F (36.9 C)-98.6 F (37 C)] 98.5 F (36.9 C) (12/11 0516) Pulse Rate:  [76-83] 76 (12/11 0516) Resp:  [17] 17 (12/11 0516) BP: (128-135)/(69-79) 128/73 mmHg (12/11 0516) SpO2:  [94 %-98 %] 95 % (12/11 0516) Last BM Date: 11/09/14  Intake/Output from previous day: 12/10 0701 - 12/11 0700 In: 1236 [P.O.:150; I.V.:836; IV Piggyback:250] Out: 35 [Drains:35] Intake/Output this shift: Total I/O In: 5366 [P.O.:120; I.V.:836; IV Piggyback:250] Out: 35 [Drains:35]  Comfortable Abdomen soft, non-distended, minimal to no tenderness. Drain output minimal, clearer  Lab Results:   Recent Labs  11/08/14 0327  WBC 5.6  HGB 7.6*  HCT 22.8*  PLT 227   BMET  Recent Labs  11/08/14 0327 11/09/14 0405  NA 143  --   K 3.4*  --   CL 103  --   CO2 26  --   GLUCOSE 110*  --   BUN 4*  --   CREATININE 0.60 0.62  CALCIUM 8.7  --    PT/INR No results for input(s): LABPROT, INR in the last 72 hours. ABG No results for input(s): PHART, HCO3 in the last 72 hours.  Invalid input(s): PCO2, PO2  Studies/Results: No results found.  Anti-infectives: Anti-infectives    Start     Dose/Rate Route Frequency Ordered Stop   11/09/14 0800  vancomycin (VANCOCIN) IVPB 750 mg/150 ml premix     750 mg150 mL/hr over 60 Minutes Intravenous Every 12 hours 11/09/14 0707     11/09/14 0700  piperacillin-tazobactam (ZOSYN) IVPB 3.375 g     3.375 g12.5 mL/hr over 240 Minutes Intravenous 3 times per day 11/09/14 0655     11/02/14 1600  ceFAZolin (ANCEF) IVPB 2 g/50 mL premix     2 g100 mL/hr over 30 Minutes Intravenous Every 6 hours 11/02/14 1451 11/03/14 0453   11/02/14 0600  ceFAZolin (ANCEF) IVPB 2 g/50 mL premix     2 g100 mL/hr over 30 Minutes Intravenous On call to O.R. 11/01/14 1541 11/02/14 1207       Assessment/Plan: s/p Procedure(s): LAPAROSCOPY DIAGNOSTIC (N/A) WHIPPLE PROCEDURE (N/A)  Will try full liquids today and hold on TNA Continue antibiotics   LOS: 8 days    Larry Alcock A 11/10/2014

## 2014-11-11 ENCOUNTER — Inpatient Hospital Stay (HOSPITAL_COMMUNITY): Payer: Medicare Other

## 2014-11-11 LAB — GLUCOSE, CAPILLARY
GLUCOSE-CAPILLARY: 114 mg/dL — AB (ref 70–99)
GLUCOSE-CAPILLARY: 134 mg/dL — AB (ref 70–99)
GLUCOSE-CAPILLARY: 93 mg/dL (ref 70–99)
Glucose-Capillary: 114 mg/dL — ABNORMAL HIGH (ref 70–99)
Glucose-Capillary: 93 mg/dL (ref 70–99)
Glucose-Capillary: 130 mg/dL — ABNORMAL HIGH (ref 70–99)

## 2014-11-11 LAB — BASIC METABOLIC PANEL
ANION GAP: 15 (ref 5–15)
BUN: 7 mg/dL (ref 6–23)
CHLORIDE: 101 meq/L (ref 96–112)
CO2: 25 mEq/L (ref 19–32)
Calcium: 8.6 mg/dL (ref 8.4–10.5)
Creatinine, Ser: 0.95 mg/dL (ref 0.50–1.10)
GFR calc non Af Amer: 62 mL/min — ABNORMAL LOW (ref >90)
GFR, EST AFRICAN AMERICAN: 71 mL/min — AB (ref >90)
Glucose, Bld: 122 mg/dL — ABNORMAL HIGH (ref 70–99)
Potassium: 2.7 mEq/L — CL (ref 3.7–5.3)
Sodium: 141 mEq/L (ref 137–147)

## 2014-11-11 LAB — MAGNESIUM: Magnesium: 2 mg/dL (ref 1.5–2.5)

## 2014-11-11 LAB — PHOSPHORUS: Phosphorus: 3.7 mg/dL (ref 2.3–4.6)

## 2014-11-11 MED ORDER — SODIUM CHLORIDE 0.9 % IJ SOLN
10.0000 mL | INTRAMUSCULAR | Status: DC | PRN
  Administered 2014-11-12 – 2014-11-20 (×5): 10 mL
  Filled 2014-11-11 (×5): qty 40

## 2014-11-11 MED ORDER — FAT EMULSION 20 % IV EMUL
240.0000 mL | INTRAVENOUS | Status: AC
  Administered 2014-11-11: 240 mL via INTRAVENOUS
  Filled 2014-11-11: qty 250

## 2014-11-11 MED ORDER — POTASSIUM CHLORIDE 10 MEQ/100ML IV SOLN
10.0000 meq | INTRAVENOUS | Status: AC
  Administered 2014-11-11 (×4): 10 meq via INTRAVENOUS
  Filled 2014-11-11 (×4): qty 100

## 2014-11-11 MED ORDER — POTASSIUM CHLORIDE 10 MEQ/100ML IV SOLN
10.0000 meq | INTRAVENOUS | Status: AC
  Administered 2014-11-11 (×3): 10 meq via INTRAVENOUS
  Filled 2014-11-11 (×3): qty 100

## 2014-11-11 MED ORDER — TRACE MINERALS CR-CU-F-FE-I-MN-MO-SE-ZN IV SOLN
INTRAVENOUS | Status: AC
  Administered 2014-11-11: 22:00:00 via INTRAVENOUS
  Filled 2014-11-11: qty 1000

## 2014-11-11 MED ORDER — SODIUM CHLORIDE 0.45 % IV SOLN
INTRAVENOUS | Status: AC
  Administered 2014-11-11: 18:00:00 via INTRAVENOUS

## 2014-11-11 MED ORDER — SODIUM CHLORIDE 0.9 % IJ SOLN
10.0000 mL | Freq: Two times a day (BID) | INTRAMUSCULAR | Status: DC
  Administered 2014-11-11 – 2014-11-18 (×2): 10 mL

## 2014-11-11 NOTE — Progress Notes (Addendum)
PARENTERAL NUTRITION CONSULT NOTE - INITIAL  Pharmacy Consult for TPN Indication: prolonged ileus  No Known Allergies  Patient Measurements: Height: 5\' 3"  (160 cm) Weight: 172 lb 6.4 oz (78.2 kg) IBW/kg (Calculated) : 52.4 Adjusted Body Weight: 62.7kg   Vital Signs: Temp: 98.2 F (36.8 C) (12/12 0440) Temp Source: Oral (12/12 0440) BP: 128/73 mmHg (12/12 0440) Pulse Rate: 86 (12/12 0440) Intake/Output from previous day: 12/11 0701 - 12/12 0700 In: 81 [P.O.:220; I.V.:400; IV Piggyback:200] Out: 16 [Drains:16] Intake/Output from this shift:    Labs: No results for input(s): WBC, HGB, HCT, PLT, APTT, INR in the last 72 hours.   Recent Labs  11/09/14 0405  CREATININE 0.62   Estimated Creatinine Clearance: 69.4 mL/min (by C-G formula based on Cr of 0.62).    Recent Labs  11/10/14 2341 11/11/14 0443 11/11/14 0801  GLUCAP 106* 114* 114*    Medical History: Past Medical History  Diagnosis Date  . Chicken pox   . Depression   . Hypertension   . Hyperlipidemia   . Polyp of colon   . Shingles JUNE 2015    RIGHT  SHOULDER  . Anxiety     since diagnosis  . Allergy   . Pancreatic cancer 07/12/14    Adenocarcinoma. Chemo/ radiation completed early 9'15-Dr. Sherril follows  . Hx of radiation therapy 08/10/14-09/21/14    pancreas 50.4Gy/22fx  . Asthma     exercise induced, seasonal   . GERD (gastroesophageal reflux disease)     uses TUM  PRN, since chemo-   . Arthritis     mild, hips      Insulin Requirements in the past 24 hours:  3 units SSI  Current Nutrition:  Clear liquid- had 75% intake 12/11 with dinner  Nutritional Goals:  1650-1850 kCal, 90-100 grams of protein per day per RD recommendations on 12/12  Assessment: 57 YOF s/p whipple procedure on 12/3 with persistent nausea and unable to tolerate PO intake.   GI: Has had ~10% weight loss in past 3 months. Possible pancreatic leak with cloudy drainage and elevated lipase- improving on abx.  Probable ileus/delayed emptying with persistent nausea. Last BM 12/11.   Endo: A1C 5.5 from November of this year. CBGs have been 106-129 in last 24h- currently on resistant SSI with q6 checks  Lytes: last labs from 12/9- K was low at 3.4; last mag from 12/4 was 1.6, last phos from 12/4 was 3.  Renal: last SCr from 12/10- 0.62.   Pulm: 97/RA; she is only taking Symbicort PRN PTA which is not correct dosing  Cards: VSS- not on any meds currently. PTA she was on lisinopril and HCTZ along with statin  Hepatobil: last LFTs from 12/9- WNL except low albumin at 2  Neuro: A&O, has hx of anxiety and depression  ID:  On vancomycin and Zosyn D#3 for ?wound infection. Improving, afebrile, WBC wnl. No cultures.  Best Practices: IV PPI, MC, Lovenox  TPN Access: PICC to be placed 12/12 TPN day#: 0 (starting 12/12)  Plan:  -awaiting lab work from this morning- will follow up and order electrolyte repletion if needed as per protocol -start Clinimix E 5/15 at 86mL/hr + 20% fat emulsion at 25mL/hr tonight at 1800- this will provide 36g protein and 991kcal in a 24h period. Patient is at risk for refeeding, therefore started at a slightly lower rate than half of her goal rate (Goal rate will be Clinimix 5/15 at 87mL/hr + 20% fat emulsion at 25mL/hr- this will provide 90g  protein and 1758kcal in a 24h period) -daily multivitamin and trace elements in TPN -continue CBGs and SSI as ordered (resistent scale, q6h checks) -change MIVF to 1/2NS only at 85mL/hr starting at 1800 tonight with TPN is hung -TPN labs as ordered (full panel tomorrow)   Ander Purpura D. Darek Eifler, PharmD, BCPS Clinical Pharmacist Pager: [phone] 11/11/2014 9:57 AM   ADDENDUM K 2.7, phos 3.7, mag 2, Na 141, CL 101.  Plan: -KCl 39mEq runs x4 already ordered. Will order 3 more as she likely needs more supplementation (11mEq of supplementation typically raises K value 0.1) -full TPN labs in the morning  Devinn Voshell D. Daxtin Leiker, PharmD,  BCPS Clinical Pharmacist Pager: [phone] 11/11/2014 2:07 PM

## 2014-11-11 NOTE — Progress Notes (Signed)
INITIAL NUTRITION ASSESSMENT  DOCUMENTATION CODES Per approved criteria  -Severe malnutrition in the context of chronic illness  Pt meets criteria for severe MALNUTRITION in the context of chronic illness as evidenced by 10.4% body weight loss in 3 months, PO intake < 75% for > one month.   INTERVENTION: -TPN per pharmacy; pt poses refeeding risk d/t suboptimal nutrition intake for > 7 days, and severe progressive weight loss -Monitor refeeding labs for 3 days and replete as needed -Recommend anti-emetic to assist with PO intake -Supplementation as tolerated (pt declined at this time) -Pt may benefit from Whipple post op nutrition education needs once appetite improves -RD to continue to monitor  NUTRITION DIAGNOSIS: Inadequate oral intake related to nausea/abd pain as evidenced by PO intake < 75%, 10.4% body weight loss in 3 motnhs.   Goal: TPN + PO to meet >/= 90% of their estimated nutrition needs    Monitor:  TPN tolerance, GI profile, total protein/energy intake, labs, weights, nutrition education needs  Reason for Assessment: TPN Consult  63 y.o. female  Admitting Dx: <principal problem not specified>  ASSESSMENT: The patient is a 63 year old female who presents with pancreatic cancer. 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 Tumor markers include CA 19 - 9 level of 7872.6 (pre treatment 07/17/2014). Disease involvement includes regional lymph nodes (periportal LN). She has just completed neoadjuvant chemoradiation.   Pt s/p Procedure(s): LAPAROSCOPY DIAGNOSTIC WHIPPLE PROCEDURE  -Pt has been evaluated by oncology outpatient RD since 08/2014. Reported usual body weight around 190 lbs, and was experiencing decreased appetite d/t nausea, constipation and abd pain. RD had educated pt on constipation nutrition therapy, and encouraged intake of high protein/kcal snacks and meals, 5-6 times daily -Pt had  been selected for [employer] nutritional supplement study (3 cartons of supplement daily for 5 days before and after Whipple procedure) -However, pt reported be unable to comply with study as supplement caused decreased intake of solid foods and she had overall intolerance of supplement taste. -Pt post op 8 days with minimal PO intake of meals d/t nausea, NPO and clear liquid status. Has < 50% meal completion per pt. -Declined any supplement at this time. Will re-address once nausea improves -Appetite pta was improving, and had stable weight in past several weeks following completion of chemo and radiation -Discussed initiation of TPN to supplement nutrient needs while PO intake improves. Pt and family verbalized understanding. PICC line to be placed later today per surgery note -Pt having BMs, hypoactive BS noted -CBG elevated but within goal < 150 mg/dL   Height: Ht Readings from Last 1 Encounters:  11/04/14 5\' 3"  (1.6 m)    Weight: Wt Readings from Last 1 Encounters:  11/04/14 172 lb 6.4 oz (78.2 kg)    Ideal Body Weight: 115 lb  % Ideal Body Weight: 150%  Wt Readings from Last 10 Encounters:  11/04/14 172 lb 6.4 oz (78.2 kg)  10/30/14 175 lb 8 oz (79.606 kg)  10/24/14 170 lb 13.7 oz (77.5 kg)  10/04/14 172 lb 14.4 oz (78.427 kg)  10/04/14 173 lb (78.472 kg)  09/20/14 178 lb (80.74 kg)  09/15/14 180 lb 1.6 oz (81.693 kg)  09/08/14 182 lb 6.4 oz (82.736 kg)  09/07/14 182 lb 8 oz (82.781 kg)  08/25/14 187 lb 3.2 oz (84.913 kg)    Usual Body Weight: 190  % Usual Body Weight: 90%  BMI:  Body mass  index is 30.55 kg/(m^2).  Estimated Nutritional Needs: Kcal: 1650-1850 Protein: 90-100 gram Fluid: >/=1700 ml daily  Skin: surgical wound on abd  Diet Order: Diet full liquid  EDUCATION NEEDS: -Education not appropriate at this time   Intake/Output Summary (Last 24 hours) at 11/11/14 1037 Last data filed at 11/11/14 0700  Gross per 24 hour  Intake    820 ml  Output      16 ml  Net    804 ml    Last BM: 12/11   Labs:   Recent Labs Lab 11/06/14 0755 11/07/14 0631 11/08/14 0327 11/09/14 0405  NA 141 141 143  --   K 3.3* 3.4* 3.4*  --   CL 102 102 103  --   CO2 28 28 26   --   BUN 6 5* 4*  --   CREATININE 0.59 0.62 0.60 0.62  CALCIUM 8.7 8.6 8.7  --   GLUCOSE 101* 121* 110*  --     CBG (last 3)   Recent Labs  11/10/14 2341 11/11/14 0443 11/11/14 0801  GLUCAP 106* 114* 114*    Scheduled Meds: . antiseptic oral rinse  7 mL Mouth Rinse q12n4p  . chlorhexidine  15 mL Mouth Rinse BID  . enoxaparin (LOVENOX) injection  40 mg Subcutaneous Q24H  . insulin aspart  0-20 Units Subcutaneous 6 times per day  . pantoprazole (PROTONIX) IV  40 mg Intravenous Q24H  . piperacillin-tazobactam (ZOSYN)  IV  3.375 g Intravenous 3 times per day  . vancomycin  750 mg Intravenous Q12H    Continuous Infusions: . dextrose 5 % and 0.45 % NaCl with KCl 20 mEq/L 50 mL/hr at 11/10/14 2035    Past Medical History  Diagnosis Date  . Chicken pox   . Depression   . Hypertension   . Hyperlipidemia   . Polyp of colon   . Shingles JUNE 2015    RIGHT  SHOULDER  . Anxiety     since diagnosis  . Allergy   . Pancreatic cancer 07/12/14    Adenocarcinoma. Chemo/ radiation completed early 9'15-Dr. Sherril follows  . Hx of radiation therapy 08/10/14-09/21/14    pancreas 50.4Gy/94fx  . Asthma     exercise induced, seasonal   . GERD (gastroesophageal reflux disease)     uses TUM  PRN, since chemo-   . Arthritis     mild, hips     Past Surgical History  Procedure Laterality Date  . Breast reduction surgery Bilateral   . Colonscopy   LAST DONE SEPT 2014    X 3  . Hysteroscopy  2005    "heavy menopause"  . Eus N/A 07/12/2014    Procedure: ESOPHAGEAL ENDOSCOPIC ULTRASOUND (EUS) RADIAL;  Surgeon: Arta Silence, MD;  Location: [facility];  Service: Endoscopy;  Laterality: N/A;  . Fine needle aspiration N/A 07/12/2014    Procedure: FINE NEEDLE ASPIRATION (FNA)  RADIAL;  Surgeon: Arta Silence, MD;  Location: [facility];  Service: Endoscopy;  Laterality: N/A;  . Laparoscopy N/A 07/24/2014    Procedure: LAPAROSCOPY DIAGNOSTIC;  Surgeon: Stark Klein, MD;  Location: [facility];  Service: General;  Laterality: N/A;-"no unusual fingings"  . Breast enhancement surgery Bilateral     "breast reduction" -80's   . Eus N/A 10/25/2014    Procedure: ESOPHAGEAL ENDOSCOPIC ULTRASOUND (EUS) RADIAL;  Surgeon: Arta Silence, MD;  Location: [facility];  Service: Endoscopy;  Laterality: N/A;  . Fine needle aspiration N/A 10/25/2014    Procedure: FINE NEEDLE ASPIRATION (FNA) LINEAR;  Surgeon: Arta Silence, MD;  Location: [facility];  Service: Endoscopy;  Laterality: N/A;  . Laparoscopy N/A 11/02/2014    Procedure: LAPAROSCOPY DIAGNOSTIC;  Surgeon: Stark Klein, MD;  Location: [facility];  Service: General;  Laterality: N/A;  . Whipple procedure N/A 11/02/2014    Procedure: WHIPPLE PROCEDURE;  Surgeon: Stark Klein, MD;  Location: [facility];  Service: General;  Laterality: N/A;    Atlee Abide MS RD LDN Clinical Dietitian [facility]CH:[phone]

## 2014-11-11 NOTE — Progress Notes (Signed)
Patient ID: Sonya Larson, female   DOB: [birth year], 63 y.o.   MRN: [id number] 9 Days Post-Op  Subjective: Generally feels better but has persistent low-grade nausea and full feeling and really unable to tolerate much in the way of clear liquids. No vomiting. No significant pain. Energy level is gradually improving  Objective: Vital signs in last 24 hours: Temp:  [98.2 F (36.8 C)-99.1 F (37.3 C)] 98.2 F (36.8 C) (12/12 0440) Pulse Rate:  [76-86] 86 (12/12 0440) Resp:  [17-18] 18 (12/12 0440) BP: (128-133)/(71-75) 128/73 mmHg (12/12 0440) SpO2:  [96 %-98 %] 97 % (12/12 0440) Last BM Date: 11/10/14  Intake/Output from previous day: 12/11 0701 - 12/12 0700 In: 70 [P.O.:220; I.V.:400; IV Piggyback:200] Out: 16 [Drains:16] Intake/Output this shift:    General appearance: alert, cooperative and no distress Resp: no wheezing or increased work of breathing GI: normal findings: soft, non-tender and nondistended Incision/Wound: clean and dry without evidence of infection. JP drains with scant cloudy drainage  Lab Results:  No results for input(s): WBC, HGB, HCT, PLT in the last 72 hours. BMET  Recent Labs  11/09/14 0405  CREATININE 0.62     Studies/Results: No results found.  Anti-infectives: Anti-infectives    Start     Dose/Rate Route Frequency Ordered Stop   11/09/14 0800  vancomycin (VANCOCIN) IVPB 750 mg/150 ml premix     750 mg150 mL/hr over 60 Minutes Intravenous Every 12 hours 11/09/14 0707     11/09/14 0700  piperacillin-tazobactam (ZOSYN) IVPB 3.375 g     3.375 g12.5 mL/hr over 240 Minutes Intravenous 3 times per day 11/09/14 0655     11/02/14 1600  ceFAZolin (ANCEF) IVPB 2 g/50 mL premix     2 g100 mL/hr over 30 Minutes Intravenous Every 6 hours 11/02/14 1451 11/03/14 0453   11/02/14 0600  ceFAZolin (ANCEF) IVPB 2 g/50 mL premix     2 g100 mL/hr over 30 Minutes Intravenous On call to O.R. 11/01/14 1541 11/02/14 1207      Assessment/Plan: s/p  Procedure(s): LAPAROSCOPY DIAGNOSTIC WHIPPLE PROCEDURE Possible pancreatic leak with cloudy drainage and elevated lipase, now on antibiotics, drainage decreased and abdomen benign Persistent nausea, probable ileus/delayed gastric emptying. We will place a PICC line and start TNA   LOS: 9 days    Naithen Rivenburg T 11/11/2014

## 2014-11-11 NOTE — Progress Notes (Signed)
Peripherally Inserted Central Catheter/Midline Placement  The IV Nurse has discussed with the patient and/or persons authorized to consent for the patient, the purpose of this procedure and the potential benefits and risks involved with this procedure.  The benefits include less needle sticks, lab draws from the catheter and patient may be discharged home with the catheter.  Risks include, but not limited to, infection, bleeding, blood clot (thrombus formation), and puncture of an artery; nerve damage and irregular heat beat.  Alternatives to this procedure were also discussed.  PICC/Midline Placement Documentation  PICC / Midline Double Lumen 07/37/10 PICC Right Basilic 40 cm 0 cm (Active)  Indication for Insertion or Continuance of Line Administration of hyperosmolar/irritating solutions (i.e. TPN, Vancomycin, etc.) 11/11/2014  3:01 PM  Exposed Catheter (cm) 0 cm 11/11/2014  3:01 PM  Site Assessment Clean;Dry;Intact 11/11/2014  3:01 PM  Lumen #1 Status Flushed;Saline locked;Blood return noted 11/11/2014  3:01 PM  Lumen #2 Status Flushed;Saline locked;Blood return noted 11/11/2014  3:01 PM  Dressing Type Transparent 11/11/2014  3:01 PM  Dressing Status Clean;Dry;Intact;Antimicrobial disc in place 11/11/2014  3:01 PM  Line Care Connections checked and tightened 11/11/2014  3:01 PM  Line Adjustment (NICU/IV Team Only) No 11/11/2014  3:01 PM  Dressing Intervention New dressing 11/11/2014  3:01 PM  Dressing Change Due 11/18/14 11/11/2014  3:01 PM       Rolena Infante 11/11/2014, 3:02 PM

## 2014-11-12 DIAGNOSIS — E43 Unspecified severe protein-calorie malnutrition: Secondary | ICD-10-CM | POA: Insufficient documentation

## 2014-11-12 LAB — GLUCOSE, CAPILLARY
GLUCOSE-CAPILLARY: 120 mg/dL — AB (ref 70–99)
GLUCOSE-CAPILLARY: 125 mg/dL — AB (ref 70–99)
GLUCOSE-CAPILLARY: 113 mg/dL — AB (ref 70–99)
Glucose-Capillary: 115 mg/dL — ABNORMAL HIGH (ref 70–99)
Glucose-Capillary: 129 mg/dL — ABNORMAL HIGH (ref 70–99)

## 2014-11-12 LAB — COMPREHENSIVE METABOLIC PANEL
ALBUMIN: 2.2 g/dL — AB (ref 3.5–5.2)
ALT: 31 U/L (ref 0–35)
AST: 34 U/L (ref 0–37)
Alkaline Phosphatase: 86 U/L (ref 39–117)
Anion gap: 13 (ref 5–15)
BUN: 7 mg/dL (ref 6–23)
CHLORIDE: 101 meq/L (ref 96–112)
CO2: 25 meq/L (ref 19–32)
Calcium: 8.5 mg/dL (ref 8.4–10.5)
Creatinine, Ser: 0.96 mg/dL (ref 0.50–1.10)
GFR calc Af Amer: 70 mL/min — ABNORMAL LOW (ref >90)
GFR, EST NON AFRICAN AMERICAN: 61 mL/min — AB (ref >90)
Glucose, Bld: 114 mg/dL — ABNORMAL HIGH (ref 70–99)
POTASSIUM: 2.9 meq/L — AB (ref 3.7–5.3)
Sodium: 139 mEq/L (ref 137–147)
Total Bilirubin: 0.4 mg/dL (ref 0.3–1.2)
Total Protein: 5.7 g/dL — ABNORMAL LOW (ref 6.0–8.3)

## 2014-11-12 LAB — CBC
HEMATOCRIT: 23.3 % — AB (ref 36.0–46.0)
Hemoglobin: 7.7 g/dL — ABNORMAL LOW (ref 12.0–15.0)
MCH: 30.3 pg (ref 26.0–34.0)
MCHC: 33 g/dL (ref 30.0–36.0)
MCV: 91.7 fL (ref 78.0–100.0)
PLATELETS: 402 10*3/uL — AB (ref 150–400)
RBC: 2.54 MIL/uL — AB (ref 3.87–5.11)
RDW: 14.3 % (ref 11.5–15.5)
WBC: 7.9 10*3/uL (ref 4.0–10.5)

## 2014-11-12 LAB — DIFFERENTIAL
BASOS ABS: 0 10*3/uL (ref 0.0–0.1)
Basophils Relative: 0 % (ref 0–1)
EOS PCT: 3 % (ref 0–5)
Eosinophils Absolute: 0.2 10*3/uL (ref 0.0–0.7)
LYMPHS ABS: 0.4 10*3/uL — AB (ref 0.7–4.0)
LYMPHS PCT: 5 % — AB (ref 12–46)
MONO ABS: 0.6 10*3/uL (ref 0.1–1.0)
Monocytes Relative: 8 % (ref 3–12)
Neutro Abs: 6.7 10*3/uL (ref 1.7–7.7)
Neutrophils Relative %: 84 % — ABNORMAL HIGH (ref 43–77)

## 2014-11-12 LAB — PREALBUMIN: Prealbumin: 9.5 mg/dL — ABNORMAL LOW (ref 17.0–34.0)

## 2014-11-12 LAB — TRIGLYCERIDES: TRIGLYCERIDES: 141 mg/dL (ref <150)

## 2014-11-12 LAB — MAGNESIUM: Magnesium: 2 mg/dL (ref 1.5–2.5)

## 2014-11-12 LAB — PHOSPHORUS: Phosphorus: 3.6 mg/dL (ref 2.3–4.6)

## 2014-11-12 MED ORDER — IBUPROFEN 400 MG PO TABS
400.0000 mg | ORAL_TABLET | Freq: Four times a day (QID) | ORAL | Status: DC | PRN
  Administered 2014-11-12: 400 mg via ORAL
  Filled 2014-11-12: qty 1

## 2014-11-12 MED ORDER — TRACE MINERALS CR-CU-F-FE-I-MN-MO-SE-ZN IV SOLN
INTRAVENOUS | Status: AC
  Administered 2014-11-12: 17:00:00 via INTRAVENOUS
  Filled 2014-11-12: qty 1000

## 2014-11-12 MED ORDER — SODIUM CHLORIDE 0.45 % IV SOLN: INTRAVENOUS | Status: DC

## 2014-11-12 MED ORDER — POTASSIUM CHLORIDE 10 MEQ/50ML IV SOLN
10.0000 meq | INTRAVENOUS | Status: AC
  Administered 2014-11-12 (×4): 10 meq via INTRAVENOUS
  Filled 2014-11-12 (×4): qty 50

## 2014-11-12 MED ORDER — POTASSIUM CHLORIDE 10 MEQ/50ML IV SOLN
10.0000 meq | INTRAVENOUS | Status: AC
Start: 2014-11-12 — End: 2014-11-12
  Administered 2014-11-12 (×2): 10 meq via INTRAVENOUS
  Filled 2014-11-12 (×3): qty 50

## 2014-11-12 MED ORDER — POTASSIUM CHLORIDE 10 MEQ/50ML IV SOLN
10.0000 meq | Freq: Once | INTRAVENOUS | Status: AC
  Administered 2014-11-12: 10 meq via INTRAVENOUS
  Filled 2014-11-12: qty 50

## 2014-11-12 MED ORDER — FAT EMULSION 20 % IV EMUL
240.0000 mL | INTRAVENOUS | Status: AC
  Administered 2014-11-12: 240 mL via INTRAVENOUS
  Filled 2014-11-12: qty 250

## 2014-11-12 NOTE — Progress Notes (Signed)
ANTIBIOTIC CONSULT NOTE  Pharmacy Consult for Vancomycin  Indication: Wound infection  No Known Allergies  Patient Measurements: Height: 5\' 3"  (160 cm) Weight: 172 lb 6.4 oz (78.2 kg) IBW/kg (Calculated) : 52.4  Vital Signs: Temp: 98.2 F (36.8 C) (12/13 0522) Temp Source: Oral (12/13 0522) BP: 121/69 mmHg (12/13 0522) Pulse Rate: 85 (12/13 0522)  Labs:  Recent Labs  11/11/14 1230 11/12/14 0534  WBC  --  7.9  HGB  --  7.7*  PLT  --  402*  CREATININE 0.95 0.96   Estimated Creatinine Clearance: 57.8 mL/min (by C-G formula based on Cr of 0.96).  Assessment: 63 y/o F s/p whipple procedure, having some mild temps-started anti-biotics for wound infection. Fevers are resolved, WBC remain WBL. SCr increased slightly from 0.6 to 0.96, CrCl ~55-2mL/min.  Goal of Therapy:  Vancomycin trough level 15-20 mcg/ml  Plan:  -continue Vancomycin 750 mg IV q12h -Zosyn per MD- 3.375g IV q8h EI is correct dosing -Trend WBC, any more temp spikes, renal function  -Drug levels as indicated- if vancomycin is not stopped soon, may need to obtain trough  Daphney Hopke D. Siri Buege, PharmD, BCPS Clinical Pharmacist Pager: [phone] 11/12/2014 9:52 AM

## 2014-11-12 NOTE — Progress Notes (Signed)
Patient ID: Sonya Larson, female   DOB: 11-Apr-1951, 63 y.o.   MRN: [id number] 10 Days Post-Op  Subjective: Feels significantly better today. Denies pain. Nausea is improved and she feels a little bit hungry. PICC line has been placed in TNA started. Had some loose bowel movements yesterday.  Objective: Vital signs in last 24 hours: Temp:  [98.2 F (36.8 C)-99 F (37.2 C)] 98.2 F (36.8 C) (12/13 0522) Pulse Rate:  [85-92] 85 (12/13 0522) Resp:  [18-20] 18 (12/13 0522) BP: (121-135)/(69-81) 121/69 mmHg (12/13 0522) SpO2:  [96 %-97 %] 97 % (12/13 0522) Last BM Date: 11/10/14  Intake/Output from previous day: 12/12 0701 - 12/13 0700 In: 1705 [P.O.:480; I.V.:358; IV Piggyback:800; TPN:67] Out: 713 [Urine:700; Drains:13] Intake/Output this shift:    General appearance: alert, cooperative and no distress GI: normal findings: soft, non-tender and purulent drainage from one of the 2 JP drains Incision/Wound: without evidence of infection  Lab Results:   Recent Labs  11/12/14 0534  WBC 7.9  HGB 7.7*  HCT 23.3*  PLT 402*   BMET  Recent Labs  11/11/14 1230 11/12/14 0534  NA 141 139  K 2.7* 2.9*  CL 101 101  CO2 25 25  GLUCOSE 122* 114*  BUN 7 7  CREATININE 0.95 0.96  CALCIUM 8.6 8.5     Studies/Results: Dg Chest Port 1 View  11/11/2014   CLINICAL DATA:  Right-sided PICC line placement. Nausea and vomiting with sensation of something stuck in right-sided of throat. In-hospital to receive Whipple procedure.  EXAM: PORTABLE CHEST - 1 VIEW  COMPARISON:  07/24/2014  FINDINGS: Right-sided PICC line is present with tip overlying the region of the SVC. Lungs are hypoinflated with mild left basilar opacification likely atelectasis/effusion although cannot exclude infection. Cardiomediastinal silhouette is within normal. There are mild age in changes of the spine. Skin staples are present over the left upper quadrant.  IMPRESSION: Hypoinflation with mild left basilar  opacification likely atelectasis/effusion.  Right-sided PICC line with tip overlying the region of the SVC.   Electronically Signed   By: Marin Olp M.D.   On: 11/11/2014 20:14    Anti-infectives: Anti-infectives    Start     Dose/Rate Route Frequency Ordered Stop   11/09/14 0800  vancomycin (VANCOCIN) IVPB 750 mg/150 ml premix     750 mg150 mL/hr over 60 Minutes Intravenous Every 12 hours 11/09/14 0707     11/09/14 0700  piperacillin-tazobactam (ZOSYN) IVPB 3.375 g     3.375 g12.5 mL/hr over 240 Minutes Intravenous 3 times per day 11/09/14 0655     11/02/14 1600  ceFAZolin (ANCEF) IVPB 2 g/50 mL premix     2 g100 mL/hr over 30 Minutes Intravenous Every 6 hours 11/02/14 1451 11/03/14 0453   11/02/14 0600  ceFAZolin (ANCEF) IVPB 2 g/50 mL premix     2 g100 mL/hr over 30 Minutes Intravenous On call to O.R. 11/01/14 1541 11/02/14 1207      Assessment/Plan: s/p Procedure(s): LAPAROSCOPY DIAGNOSTIC WHIPPLE PROCEDURE Possible minor pancreatic leak Postop nausea, improved today. Now on TNA. Hypokalemia. IV replacement in progress. Overall improving   LOS: 10 days    Antonin Meininger T 11/12/2014

## 2014-11-12 NOTE — Progress Notes (Addendum)
[place] NOTE  Pharmacy Consult for TPN Indication: prolonged ileus  No Known Allergies  Patient Measurements: Height: 5\' 3"  (160 cm) Weight: 172 lb 6.4 oz (78.2 kg) IBW/kg (Calculated) : 52.4 Adjusted Body Weight: 62.7kg   Vital Signs: Temp: 98.2 F (36.8 C) (12/13 0522) Temp Source: Oral (12/13 0522) BP: 121/69 mmHg (12/13 0522) Pulse Rate: 85 (12/13 0522) Intake/Output from previous day: 12/12 0701 - 12/13 0700 In: 1705 [P.O.:480; I.V.:358; IV Piggyback:800; TPN:67] Out: 713 [Urine:700; Drains:13] Intake/Output from this shift:    Labs:  Recent Labs  11/12/14 0534  WBC 7.9  HGB 7.7*  HCT 23.3*  PLT 402*     Recent Labs  11/11/14 1230 11/12/14 0534  NA 141 139  K 2.7* 2.9*  CL 101 101  CO2 25 25  GLUCOSE 122* 114*  BUN 7 7  CREATININE 0.95 0.96  CALCIUM 8.6 8.5  MG 2.0 2.0  PHOS 3.7 3.6  PROT  --  5.7*  ALBUMIN  --  2.2*  AST  --  34  ALT  --  31  ALKPHOS  --  86  BILITOT  --  0.4  TRIG  --  141   Estimated Creatinine Clearance: 57.8 mL/min (by C-G formula based on Cr of 0.96).    Recent Labs  11/11/14 2331 11/12/14 0403 11/12/14 0820  GLUCAP 130* 120* 125*    Insulin Requirements in the past 24 hours:  6 units SSI  Current Nutrition:  Clinimix E 5/15 @ 63mL/hr + 20% lipid emulsion @ 72mL/hr- provides 36g protein and 991kcal in a 24h period Full liquid diet with minimal intake  Nutritional Goals:  1650-1850 kCal, 90-100 grams of protein per day per RD recommendations on 12/12  Assessment: 84 YOF s/p whipple procedure on 12/3 with persistent nausea and unable to tolerate PO intake.   GI: Has had ~10% weight loss in past 3 months. Possible pancreatic leak with cloudy drainage and elevated lipase- improving on abx. Probable ileus/delayed emptying with persistent nausea- some improvement. Several loose BMs 12/12. Baseline prealbumin pending  Endo: A1C 5.5 from November of this year. CBGs have been 114-134 in last  24h- currently on resistant SSI with q6 checks  Lytes: K 2.9- MD currently repleting with 4 KCl runs, mag 2, phos 3.6, Na and Cl nml, CorCa 9.8 (Ca x phos product = 35)  Renal: SCr 0.96, est CrCl ~76mL/min. UOP 0.57mL/kg/hr; on halfNS @ 76mL/hr  Pulm: 97/RA; she is only taking Symbicort PRN PTA (not the standard approved way to take maintenance inhalers)  Cards: VSS- not on any meds currently. PTA she was on lisinopril and HCTZ along with statin  Hepatobil: LFTs wnl except albumin low at 2.2; trigs 141  Neuro: A&O, has hx of anxiety and depression  ID:  On vancomycin and Zosyn D#4 for ?wound infection. Improving, afebrile, WBC wnl. No cultures.  Best Practices: IV PPI, MC, Lovenox  TPN Access: PICC to be placed 12/12 TPN day#: 1 (started 12/12)  Plan:  -give an additional 3 KCl runs for a total of 7 today (each 24mEq of KCl raises K ~0.1) -increase Clinimix E 5/15 slowly to 67mL/hr + 20% fat emulsion at 71mL/hr tonight at 1800- this will provide 48g protein and 1162kcal in a 24h period. Increasing slowly as K drop this morning despite repletion yesterday can be indicative of refeeding. Will continue to monitor electrolytes **Goal rate will be Clinimix 5/15 at 3mL/hr + 20% fat emulsion at 29mL/hr- this will provide 90g protein  and 1758kcal in a 24h period -daily multivitamin and trace elements in TPN -continue CBGs and SSI as ordered (resistent scale, q6h checks) -change MIVF to 1/2NS KVO starting at 1800 tonight with TPN is hung -TPN labs as ordered (full panel tomorrow)   Ander Purpura D. Eduardo Wurth, PharmD, BCPS Clinical Pharmacist Pager: [phone] 11/12/2014 9:37 AM

## 2014-11-13 LAB — COMPREHENSIVE METABOLIC PANEL
ALT: 29 U/L (ref 0–35)
AST: 31 U/L (ref 0–37)
Albumin: 2.2 g/dL — ABNORMAL LOW (ref 3.5–5.2)
Alkaline Phosphatase: 85 U/L (ref 39–117)
Anion gap: 10 (ref 5–15)
BUN: 8 mg/dL (ref 6–23)
CALCIUM: 8.6 mg/dL (ref 8.4–10.5)
CO2: 27 meq/L (ref 19–32)
CREATININE: 0.88 mg/dL (ref 0.50–1.10)
Chloride: 105 mEq/L (ref 96–112)
GFR calc Af Amer: 78 mL/min — ABNORMAL LOW (ref >90)
GFR, EST NON AFRICAN AMERICAN: 67 mL/min — AB (ref >90)
GLUCOSE: 116 mg/dL — AB (ref 70–99)
Potassium: 3.2 mEq/L — ABNORMAL LOW (ref 3.7–5.3)
Sodium: 142 mEq/L (ref 137–147)
Total Bilirubin: 0.4 mg/dL (ref 0.3–1.2)
Total Protein: 5.7 g/dL — ABNORMAL LOW (ref 6.0–8.3)

## 2014-11-13 LAB — DIFFERENTIAL
BASOS ABS: 0 10*3/uL (ref 0.0–0.1)
BASOS PCT: 0 % (ref 0–1)
Eosinophils Absolute: 0.4 10*3/uL (ref 0.0–0.7)
Eosinophils Relative: 5 % (ref 0–5)
Lymphocytes Relative: 5 % — ABNORMAL LOW (ref 12–46)
Lymphs Abs: 0.4 10*3/uL — ABNORMAL LOW (ref 0.7–4.0)
MONO ABS: 0.5 10*3/uL (ref 0.1–1.0)
Monocytes Relative: 6 % (ref 3–12)
NEUTROS ABS: 6.9 10*3/uL (ref 1.7–7.7)
Neutrophils Relative %: 84 % — ABNORMAL HIGH (ref 43–77)

## 2014-11-13 LAB — GLUCOSE, CAPILLARY
GLUCOSE-CAPILLARY: 134 mg/dL — AB (ref 70–99)
GLUCOSE-CAPILLARY: 128 mg/dL — AB (ref 70–99)
Glucose-Capillary: 102 mg/dL — ABNORMAL HIGH (ref 70–99)
Glucose-Capillary: 111 mg/dL — ABNORMAL HIGH (ref 70–99)
Glucose-Capillary: 122 mg/dL — ABNORMAL HIGH (ref 70–99)
Glucose-Capillary: 139 mg/dL — ABNORMAL HIGH (ref 70–99)

## 2014-11-13 LAB — CBC
HEMATOCRIT: 23.7 % — AB (ref 36.0–46.0)
Hemoglobin: 7.5 g/dL — ABNORMAL LOW (ref 12.0–15.0)
MCH: 29.3 pg (ref 26.0–34.0)
MCHC: 31.6 g/dL (ref 30.0–36.0)
MCV: 92.6 fL (ref 78.0–100.0)
Platelets: 409 10*3/uL — ABNORMAL HIGH (ref 150–400)
RBC: 2.56 MIL/uL — ABNORMAL LOW (ref 3.87–5.11)
RDW: 14.4 % (ref 11.5–15.5)
WBC: 8.3 10*3/uL (ref 4.0–10.5)

## 2014-11-13 LAB — PHOSPHORUS: PHOSPHORUS: 3.5 mg/dL (ref 2.3–4.6)

## 2014-11-13 LAB — VANCOMYCIN, TROUGH: Vancomycin Tr: 14.1 ug/mL (ref 10.0–20.0)

## 2014-11-13 LAB — TRIGLYCERIDES: Triglycerides: 124 mg/dL (ref <150)

## 2014-11-13 LAB — MAGNESIUM: MAGNESIUM: 2.1 mg/dL (ref 1.5–2.5)

## 2014-11-13 LAB — PREALBUMIN: Prealbumin: 10.1 mg/dL — ABNORMAL LOW (ref 17.0–34.0)

## 2014-11-13 MED ORDER — CLINIMIX E/DEXTROSE (5/15) 5 % IV SOLN
INTRAVENOUS | Status: AC
  Administered 2014-11-13: 18:00:00 via INTRAVENOUS
  Filled 2014-11-13: qty 2000

## 2014-11-13 MED ORDER — FAT EMULSION 20 % IV EMUL
250.0000 mL | INTRAVENOUS | Status: AC
  Administered 2014-11-13: 250 mL via INTRAVENOUS
  Filled 2014-11-13: qty 250

## 2014-11-13 MED ORDER — ALUM & MAG HYDROXIDE-SIMETH 200-200-20 MG/5ML PO SUSP
30.0000 mL | Freq: Four times a day (QID) | ORAL | Status: DC | PRN
  Administered 2014-11-13: 30 mL via ORAL
  Filled 2014-11-13: qty 30

## 2014-11-13 MED ORDER — PANCRELIPASE (LIP-PROT-AMYL) 12000-38000 UNITS PO CPEP
24000.0000 [IU] | ORAL_CAPSULE | Freq: Three times a day (TID) | ORAL | Status: DC
  Administered 2014-11-13 – 2014-11-18 (×4): 24000 [IU] via ORAL
  Filled 2014-11-13 (×25): qty 2

## 2014-11-13 MED ORDER — POTASSIUM CHLORIDE 10 MEQ/50ML IV SOLN
10.0000 meq | INTRAVENOUS | Status: AC
  Administered 2014-11-13 (×4): 10 meq via INTRAVENOUS
  Filled 2014-11-13 (×4): qty 50

## 2014-11-13 MED ORDER — POTASSIUM CHLORIDE CRYS ER 20 MEQ PO TBCR
20.0000 meq | EXTENDED_RELEASE_TABLET | Freq: Two times a day (BID) | ORAL | Status: DC
  Administered 2014-11-14 (×2): 20 meq via ORAL
  Filled 2014-11-13 (×3): qty 1

## 2014-11-13 NOTE — Progress Notes (Signed)
ANTIBIOTIC CONSULT NOTE - FOLLOW UP  Pharmacy Consult for Vancomycin Indication: Wound infection  No Known Allergies  Patient Measurements: Height: 5\' 3"  (160 cm) Weight: 172 lb 6.4 oz (78.2 kg) IBW/kg (Calculated) : 52.4 Adjusted Body Weight:    Vital Signs: Temp: 98.5 F (36.9 C) (12/14 2157) Temp Source: Oral (12/14 2157) BP: 139/86 mmHg (12/14 2157) Pulse Rate: 87 (12/14 2157) Intake/Output from previous day: 12/13 0701 - 12/14 0700 In: 1716.8 [I.V.:118.8; IV Piggyback:250; VHQ:4696] Out: 310 [Urine:300; Drains:10] Intake/Output from this shift:    Labs:  Recent Labs  11/11/14 1230 11/12/14 0534 11/13/14 0500  WBC  --  7.9 8.3  HGB  --  7.7* 7.5*  PLT  --  402* 409*  CREATININE 0.95 0.96 0.88   Estimated Creatinine Clearance: 63.1 mL/min (by C-G formula based on Cr of 0.88).  Recent Labs  11/13/14 2010  [place] 14.1     Microbiology: Recent Results (from the past 720 hour(s))  MRSA PCR Screening     Status: None   Collection Time: 11/02/14  3:22 PM  Result Value Ref Range Status   MRSA by PCR NEGATIVE NEGATIVE Final    Comment:        The GeneXpert MRSA Assay (FDA approved for NASAL specimens only), is one component of a comprehensive MRSA colonization surveillance program. It is not intended to diagnose MRSA infection nor to guide or monitor treatment for MRSA infections.     Anti-infectives    Start     Dose/Rate Route Frequency Ordered Stop   11/09/14 0800  vancomycin (VANCOCIN) IVPB 750 mg/150 ml premix     750 mg75 mL/hr over 120 Minutes Intravenous Every 12 hours 11/09/14 0707     11/09/14 0700  piperacillin-tazobactam (ZOSYN) IVPB 3.375 g     3.375 g12.5 mL/hr over 240 Minutes Intravenous 3 times per day 11/09/14 0655     11/02/14 1600  ceFAZolin (ANCEF) IVPB 2 g/50 mL premix     2 g100 mL/hr over 30 Minutes Intravenous Every 6 hours 11/02/14 1451 11/03/14 0453   11/02/14 0600  ceFAZolin (ANCEF) IVPB 2 g/50 mL premix     2 g100  mL/hr over 30 Minutes Intravenous On call to O.R. 11/01/14 1541 11/02/14 1207      Assessment: 63 y/o F s/p whipple procedure on antibiotics for wound infection.  Infectious Disease: Vanc/Zosyn D#5 for temps with possibly wound infection. Afeb now (no APAP), WBC 8.3 ok. No cx. Slowed vanc rate to 88mL/hr- having red face x2 per RN/husband. Vanco trough 14.1 within goal range.  Vanc 12/10>> Zosyn 12/10>>  Goal of Therapy:  Vancomycin trough level 10-15 mcg/ml  Plan:  Continue Vancomycin 750mg  IV q12h  Lyra Alaimo S. Alford Highland, PharmD, BCPS Clinical Staff Pharmacist Pager [phone]  Eilene Ghazi Stillinger 11/13/2014,10:01 PM

## 2014-11-13 NOTE — Progress Notes (Signed)
[place] NOTE  Pharmacy Consult for TPN Indication: prolonged ileus  No Known Allergies  Patient Measurements: Height: 5\' 3"  (160 cm) Weight: 172 lb 6.4 oz (78.2 kg) IBW/kg (Calculated) : 52.4 Adjusted Body Weight: 62.7kg   Vital Signs: Temp: 98.5 F (36.9 C) (12/14 0512) Temp Source: Oral (12/13 2251) BP: 122/77 mmHg (12/14 0512) Pulse Rate: 72 (12/14 0512) Intake/Output from previous day: 12/13 0701 - 12/14 0700 In: 1716.8 [I.V.:118.8; IV Piggyback:250; CHY:8502] Out: 310 [Urine:300; Drains:10] Intake/Output from this shift:    Labs:  Recent Labs  11/12/14 0534 11/13/14 0500  WBC 7.9 8.3  HGB 7.7* 7.5*  HCT 23.3* 23.7*  PLT 402* 409*     Recent Labs  11/11/14 1230 11/12/14 0534 11/13/14 0500  NA 141 139 142  K 2.7* 2.9* 3.2*  CL 101 101 105  CO2 25 25 27   GLUCOSE 122* 114* 116*  BUN 7 7 8   CREATININE 0.95 0.96 0.88  CALCIUM 8.6 8.5 8.6  MG 2.0 2.0 2.1  PHOS 3.7 3.6 3.5  PROT  --  5.7* 5.7*  ALBUMIN  --  2.2* 2.2*  AST  --  34 31  ALT  --  31 29  ALKPHOS  --  86 85  BILITOT  --  0.4 0.4  PREALBUMIN  --  9.5*  --   TRIG  --  141 124   Estimated Creatinine Clearance: 63.1 mL/min (by C-G formula based on Cr of 0.88).    Recent Labs  11/12/14 2003 11/13/14 0007 11/13/14 0408  GLUCAP 129* 111* 134*    Insulin Requirements in the past 24 hours:  6 units SSI  Current Nutrition:  Clinimix E 5/15 @ 24mL/hr + 20% lipid emulsion @ 13mL/hr- provides 48g protein and 1162 kcal in a 24h period Full liquid diet - 25% of breakfast  Nutritional Goals:  1650-1850 kCal, 90-100 grams of protein per day per RD recommendations on 12/12  Assessment: 44 YOF s/p whipple procedure on 12/3 with persistent nausea and unable to tolerate PO intake.   GI: Has had ~10% weight loss in past 3 months. Possible pancreatic leak with cloudy drainage and elevated lipase- improving on abx. Probable ileus/delayed emptying with persistent nausea- some  improvement. Several loose BMs 12/12. Oral intake still poor. Baseline prealbumin 9.8  Endo: A1C 5.5 from November of this year. CBGs have been 113-129 in last 24h- currently on resistant SSI with q6 checks  Lytes: K 3.2 << 2.9 (s/p 7 KCl runs on 12/13), mag 2.1, phos 3.5, Na and Cl nml, CorCa 9.9 (Ca x phos product = 35). Will replace K again today.  Renal: SCr 0.88, est CrCl ~109mL/min. UOP 0.55mL/kg/hr; on 1/2 NS a [facility]  Pulm: 96/RA; she is only taking Symbicort PRN PTA (not the standard approved way to take maintenance inhalers)  Cards: VSS- not on any meds currently. PTA she was on lisinopril and HCTZ along with statin  Hepatobil: LFTs wnl except albumin low at 2.2; trigs 124 (12/14)  Neuro: A&O, has hx of anxiety and depression  ID:  On vancomycin and Zosyn D#4 for ?wound infection. Improving, afebrile, WBC wnl. No cultures.  Best Practices: IV PPI, MC, Lovenox  TPN Access: R-double lumen PICC placed 12/12 TPN day#: 1 (started 12/12)  Plan:  - Increase Clinimix E 5/15 to 75 ml/hr + 20% fat emulsion at 10 ml/hr - Daily multivitamin and trace elements in TPN - KCl 10 mEq x 4 runs - Continue CBGs and SSI as ordered (resistent  scale, q6h checks) - Will f/u TPN labs - Will f/u tolerance of full liquid diet (RN to chart po intake)  Alycia Rossetti, PharmD, BCPS Clinical Pharmacist Pager: [phone] 11/13/2014 8:48 AM

## 2014-11-13 NOTE — Clinical Documentation Improvement (Signed)
Registered Dietician documented 'severe malnutrition in the context of chronic illness' on 11/11/2014.  If you agree with this, please document severe malnutrition in the context of chronic illness in your progress note and carry over to the discharge summary or document other clinical condition if do not agree.   Thank you, Mateo Flow, RN [phone] Clinical Documentation Specialist

## 2014-11-13 NOTE — Progress Notes (Signed)
Patient ID: Sonya Larson, female   DOB: 1951/07/09, 63 y.o.   MRN: [id number] 11 Days Post-Op  Subjective: Continues to feel better.  No n/v.  Still having some loose stools, but improving.  + flatus.    Objective: Vital signs in last 24 hours: Temp:  [98.1 F (36.7 C)-98.5 F (36.9 C)] 98.5 F (36.9 C) (12/14 2157) Pulse Rate:  [72-87] 87 (12/14 2157) Resp:  [17-18] 18 (12/14 2157) BP: (122-139)/(76-86) 139/86 mmHg (12/14 2157) SpO2:  [96 %-98 %] 96 % (12/14 2157) Last BM Date: 11/13/14  Intake/Output from previous day: 12/13 0701 - 12/14 0700 In: 1716.8 [I.V.:118.8; IV Piggyback:250; YWV:3710] Out: 310 [Urine:300; Drains:10] Intake/Output this shift:    General appearance: alert, cooperative and no distress GI: normal findings: soft, non-tender and purulent drainage from both of the 2 JP drains Incision/Wound: without evidence of infection  Lab Results:   Recent Labs  11/12/14 0534 11/13/14 0500  WBC 7.9 8.3  HGB 7.7* 7.5*  HCT 23.3* 23.7*  PLT 402* 409*   BMET  Recent Labs  11/12/14 0534 11/13/14 0500  NA 139 142  K 2.9* 3.2*  CL 101 105  CO2 25 27  GLUCOSE 114* 116*  BUN 7 8  CREATININE 0.96 0.88  CALCIUM 8.5 8.6     Studies/Results: No results found.  Anti-infectives: Anti-infectives    Start     Dose/Rate Route Frequency Ordered Stop   11/09/14 0800  vancomycin (VANCOCIN) IVPB 750 mg/150 ml premix     750 mg75 mL/hr over 120 Minutes Intravenous Every 12 hours 11/09/14 0707     11/09/14 0700  piperacillin-tazobactam (ZOSYN) IVPB 3.375 g     3.375 g12.5 mL/hr over 240 Minutes Intravenous 3 times per day 11/09/14 0655     11/02/14 1600  ceFAZolin (ANCEF) IVPB 2 g/50 mL premix     2 g100 mL/hr over 30 Minutes Intravenous Every 6 hours 11/02/14 1451 11/03/14 0453   11/02/14 0600  ceFAZolin (ANCEF) IVPB 2 g/50 mL premix     2 g100 mL/hr over 30 Minutes Intravenous On call to O.R. 11/01/14 1541 11/02/14 1207      Assessment/Plan: s/p  Procedure(s): LAPAROSCOPY DIAGNOSTIC WHIPPLE PROCEDURE Minor pancreatic leak. Improving nausea.  Now on TNA. Check calorie counts with advance to low fat diet.  Hypokalemia. IV replacement in progress.  Start oral replacements.  Likely related to diarrhea.  May need pancreatic enzymes.   Overall improving Severe protein calorie malnutrition in complex chronic illness - on TNA.    LOS: 11 days    Sonya Larson 11/13/2014

## 2014-11-14 LAB — BASIC METABOLIC PANEL
Anion gap: 12 (ref 5–15)
BUN: 13 mg/dL (ref 6–23)
CALCIUM: 8.6 mg/dL (ref 8.4–10.5)
CO2: 26 mEq/L (ref 19–32)
Chloride: 103 mEq/L (ref 96–112)
Creatinine, Ser: 0.76 mg/dL (ref 0.50–1.10)
GFR calc Af Amer: >90 mL/min (ref >90)
GFR calc non Af Amer: 87 mL/min — ABNORMAL LOW (ref >90)
GLUCOSE: 140 mg/dL — AB (ref 70–99)
Potassium: 3.6 mEq/L — ABNORMAL LOW (ref 3.7–5.3)
Sodium: 141 mEq/L (ref 137–147)

## 2014-11-14 LAB — GLUCOSE, CAPILLARY
Glucose-Capillary: 148 mg/dL — ABNORMAL HIGH (ref 70–99)
Glucose-Capillary: 134 mg/dL — ABNORMAL HIGH (ref 70–99)
Glucose-Capillary: 141 mg/dL — ABNORMAL HIGH (ref 70–99)

## 2014-11-14 LAB — MAGNESIUM: Magnesium: 2.2 mg/dL (ref 1.5–2.5)

## 2014-11-14 LAB — PHOSPHORUS: PHOSPHORUS: 3.5 mg/dL (ref 2.3–4.6)

## 2014-11-14 MED ORDER — TRACE MINERALS CR-CU-F-FE-I-MN-MO-SE-ZN IV SOLN
INTRAVENOUS | Status: AC
  Administered 2014-11-14: 17:00:00 via INTRAVENOUS
  Filled 2014-11-14: qty 2000

## 2014-11-14 MED ORDER — INSULIN ASPART 100 UNIT/ML ~~LOC~~ SOLN
0.0000 [IU] | Freq: Every day | SUBCUTANEOUS | Status: DC
  Administered 2014-11-14: 3 [IU] via SUBCUTANEOUS

## 2014-11-14 MED ORDER — FAT EMULSION 20 % IV EMUL
250.0000 mL | INTRAVENOUS | Status: AC
  Administered 2014-11-14: 250 mL via INTRAVENOUS
  Filled 2014-11-14: qty 250

## 2014-11-14 MED ORDER — BOOST / RESOURCE BREEZE PO LIQD
1.0000 | Freq: Three times a day (TID) | ORAL | Status: DC
  Administered 2014-11-15: 1 via ORAL

## 2014-11-14 MED ORDER — METOCLOPRAMIDE HCL 5 MG/ML IJ SOLN
5.0000 mg | Freq: Four times a day (QID) | INTRAMUSCULAR | Status: DC
Start: 2014-11-14 — End: 2014-11-16
  Administered 2014-11-14 – 2014-11-16 (×8): 5 mg via INTRAVENOUS
  Filled 2014-11-14: qty 1
  Filled 2014-11-14: qty 2
  Filled 2014-11-14 (×4): qty 1
  Filled 2014-11-14 (×2): qty 2
  Filled 2014-11-14 (×3): qty 1

## 2014-11-14 NOTE — Progress Notes (Signed)
Calorie Count Note  48 hour calorie count ordered.  Diet: Clear Liquids as of this am Supplements: none  Lunch 12/14: 318 kcal and 8 g protein Dinner 12/14: 211 kcal and 8 g protein (vomited after eating) Supplements: none  Total intake: 529 kcal (32% of minimum estimated needs)  16 g protein (18% of minimum estimated needs)  * Pt vomited several hours after dinner. Diet was downgraded from [place] to full liquid diet. RD to order nutritional supplements. Pt remains on TPN.  Nutrition Dx: Inadequate oral intake related to nausea/abd pain as evidenced by PO intake < 75%, 10.4% body weight loss in 3 months; ongoing  Goal: Pt to meet >/= 90% of their estimated nutrition needs   Intervention: Resource Breeze po TID, each supplement provides 250 kcal and 9 grams of protein  Laurette Schimke MS, RD, LDN

## 2014-11-14 NOTE — Progress Notes (Signed)
Patient ID: Sonya Larson, female   DOB: 1951-11-10, 63 y.o.   MRN: [id number] 12 Days Post-Op  Subjective: Had nausea and vomiting again last night.    Objective: Vital signs in last 24 hours: Temp:  [98.1 F (36.7 C)-99.4 F (37.4 C)] 99.4 F (37.4 C) (12/15 0532) Pulse Rate:  [77-87] 87 (12/15 0532) Resp:  [17-19] 19 (12/15 0532) BP: (128-139)/(77-86) 133/77 mmHg (12/15 0532) SpO2:  [96 %-98 %] 96 % (12/15 0532) Last BM Date: 11/14/14  Intake/Output from previous day: 12/14 0701 - 12/15 0700 In: 1343.5 [P.O.:480; I.V.:163.5; IV Piggyback:700] Out: 24 [Drains:24] Intake/Output this shift:    General appearance: alert, cooperative and no distress GI: normal findings: soft, non-tender and purulent drainage from both of the 2 JP drains Incision/Wound: without evidence of infection  Lab Results:   Recent Labs  11/12/14 0534 11/13/14 0500  WBC 7.9 8.3  HGB 7.7* 7.5*  HCT 23.3* 23.7*  PLT 402* 409*   BMET  Recent Labs  11/13/14 0500 11/14/14 0820  NA 142 141  K 3.2* 3.6*  CL 105 103  CO2 27 26  GLUCOSE 116* 140*  BUN 8 13  CREATININE 0.88 0.76  CALCIUM 8.6 8.6     Studies/Results: No results found.  Anti-infectives: Anti-infectives    Start     Dose/Rate Route Frequency Ordered Stop   11/09/14 0800  vancomycin (VANCOCIN) IVPB 750 mg/150 ml premix     750 mg75 mL/hr over 120 Minutes Intravenous Every 12 hours 11/09/14 0707     11/09/14 0700  piperacillin-tazobactam (ZOSYN) IVPB 3.375 g     3.375 g12.5 mL/hr over 240 Minutes Intravenous 3 times per day 11/09/14 0655     11/02/14 1600  ceFAZolin (ANCEF) IVPB 2 g/50 mL premix     2 g100 mL/hr over 30 Minutes Intravenous Every 6 hours 11/02/14 1451 11/03/14 0453   11/02/14 0600  ceFAZolin (ANCEF) IVPB 2 g/50 mL premix     2 g100 mL/hr over 30 Minutes Intravenous On call to O.R. 11/01/14 1541 11/02/14 1207      Assessment/Plan: s/p Procedure(s): LAPAROSCOPY DIAGNOSTIC WHIPPLE PROCEDURE Minor  pancreatic leak. Nausea bad again.  Hold on solids again.   Start reglan. Hypokalemia. IV replacement in progress.   Severe protein calorie malnutrition in complex chronic illness - on TNA.    LOS: 12 days    Sonya Larson 11/14/2014

## 2014-11-14 NOTE — Progress Notes (Addendum)
PARENTER[facility] NUTRITION CONSULT NOTE  Pharmacy Consult for TPN Indication: prolonged ileus  No Known Allergies  Patient Measurements: Height: 5\' 3"  (160 cm) Weight: 172 lb 6.4 oz (78.2 kg) IBW/kg (Calculated) : 52.4 Adjusted Body Weight: 62.7kg   Vital Signs: Temp: 99.4 F (37.4 C) (12/15 0532) Temp Source: Oral (12/15 0532) BP: 133/77 mmHg (12/15 0532) Pulse Rate: 87 (12/15 0532) Intake/Output from previous day: 12/14 0701 - 12/15 0700 In: 1343.5 [P.O.:480; I.V.:163.5; IV Piggyback:700] Out: 24 [Drains:24] Intake/Output from this shift:    Labs:  Recent Labs  11/12/14 0534 11/13/14 0500  WBC 7.9 8.3  HGB 7.7* 7.5*  HCT 23.3* 23.7*  PLT 402* 409*     Recent Labs  11/11/14 1230 11/12/14 0534 11/13/14 0500  NA 141 139 142  K 2.7* 2.9* 3.2*  CL 101 101 105  CO2 25 25 27   GLUCOSE 122* 114* 116*  BUN 7 7 8   CREATININE 0.95 0.96 0.88  CALCIUM 8.6 8.5 8.6  MG 2.0 2.0 2.1  PHOS 3.7 3.6 3.5  PROT  --  5.7* 5.7*  ALBUMIN  --  2.2* 2.2*  AST  --  34 31  ALT  --  31 29  ALKPHOS  --  86 85  BILITOT  --  0.4 0.4  PREALBUMIN  --  9.5* 10.1*  TRIG  --  141 124   Estimated Creatinine Clearance: 63.1 mL/min (by C-G formula based on Cr of 0.88).    Recent Labs  11/13/14 1945 11/14/14 0008 11/14/14 0359  GLUCAP 122* 148* 134*    Insulin Requirements in the past 12 hours:  9 units SSI  Current Nutrition:  Clinimix E 5/15 @ 75 mL/hr + 20% lipid emulsion @ 58mL/hr- provides 90g protein and 1758 kcal in a 24h period (100% of goal) Heart healthy diet - charted intake from 12/14 - 25% breakfast, 25% lunch, 15% dinner; thus far today the patient did not tolerate breakfast  Nutritional Goals:  1650-1850 kCal, 90-100 grams of protein per day per RD recommendations on 12/12  Assessment: 36 YOF s/p whipple procedure on 12/3 with persistent nausea and unable to tolerate PO intake.   GI: Has had ~10% weight loss in past 3 months. Possible pancreatic leak with  cloudy drainage and elevated lipase- improving on abx. Probable ileus/delayed emptying with persistent nausea- some improvement. Several loose BMs 12/12. Oral intake still poor - pt with N/V this AM. Prealbumin 10.1 (12/14)   Endo: A1C 5.5 from November of this year. CBGs/12h: 122-148 - currently on resistant SSI with q6 checks  Lytes: K 3.6 << 3.2 (s/p 4 KCl runs on 12/13), mag 2.2, phos 3.5, Na and Cl nml, CorCa 9.9 (Ca x phos product = 35). MD ordered KCl 20 mEq bid po for now - will monitor closely.  Renal: SCr 0.76, est CrCl ~93mL/min. UOP not accurately charted; on 1/2 NS at [place]: 96/RA; she is only taking Symbicort PRN PTA (not the standard approved way to take maintenance inhalers)  Cards: VSS- not on any meds currently. PTA she was on lisinopril and HCTZ along with statin  Hepatobil: LFTs wnl except albumin low at 2.2; trigs 124 (12/14)  Neuro: A&O, has hx of anxiety and depression  ID:  On vancomycin and Zosyn D#5 for wound infection- improving, Tmax/24h: 99.4, WBC wnl.  No cultures.  Best Practices: IV PPI, MC, Lovenox  TPN Access: R-double lumen PICC placed 12/12  TPN day#: 3 (started 12/12)  Plan:  - Continue Clinimix E  5/15 to 75 ml/hr + 20% fat emulsion at 10 ml/hr - Daily multivitamin and trace elements in TPN - Will add 10 units insulin R to TPN bag today (pt will actually receive 9 units) - Continue CBGs and SSI as ordered (resistent scale, q6h checks) - Will f/u TPN labs - Will f/u tolerance of full liquid diet (RN to chart po intake)  Alycia Rossetti, PharmD, BCPS Clinical Pharmacist Pager: [phone] 11/14/2014 7:12 AM

## 2014-11-15 LAB — BASIC METABOLIC PANEL
Anion gap: 12 (ref 5–15)
BUN: 15 mg/dL (ref 6–23)
CALCIUM: 8.7 mg/dL (ref 8.4–10.5)
CHLORIDE: 103 meq/L (ref 96–112)
CO2: 26 meq/L (ref 19–32)
Creatinine, Ser: 0.85 mg/dL (ref 0.50–1.10)
GFR calc Af Amer: 82 mL/min — ABNORMAL LOW (ref >90)
GFR calc non Af Amer: 70 mL/min — ABNORMAL LOW (ref >90)
GLUCOSE: 125 mg/dL — AB (ref 70–99)
Potassium: 3.6 mEq/L — ABNORMAL LOW (ref 3.7–5.3)
SODIUM: 141 meq/L (ref 137–147)

## 2014-11-15 LAB — CBC
HCT: 24.9 % — ABNORMAL LOW (ref 36.0–46.0)
HEMOGLOBIN: 7.9 g/dL — AB (ref 12.0–15.0)
MCH: 29.4 pg (ref 26.0–34.0)
MCHC: 31.7 g/dL (ref 30.0–36.0)
MCV: 92.6 fL (ref 78.0–100.0)
Platelets: 447 10*3/uL — ABNORMAL HIGH (ref 150–400)
RBC: 2.69 MIL/uL — AB (ref 3.87–5.11)
RDW: 14.6 % (ref 11.5–15.5)
WBC: 10.7 10*3/uL — AB (ref 4.0–10.5)

## 2014-11-15 LAB — GLUCOSE, CAPILLARY
Glucose-Capillary: 143 mg/dL — ABNORMAL HIGH (ref 70–99)
Glucose-Capillary: 144 mg/dL — ABNORMAL HIGH (ref 70–99)

## 2014-11-15 MED ORDER — UNJURY CHICKEN SOUP POWDER: 2.0000 [oz_av] | Freq: Two times a day (BID) | ORAL | Status: DC | PRN

## 2014-11-15 MED ORDER — POTASSIUM CHLORIDE CRYS ER 20 MEQ PO TBCR
20.0000 meq | EXTENDED_RELEASE_TABLET | ORAL | Status: AC
  Administered 2014-11-15 (×2): 20 meq via ORAL
  Filled 2014-11-15: qty 1

## 2014-11-15 MED ORDER — FAT EMULSION 20 % IV EMUL
250.0000 mL | INTRAVENOUS | Status: AC
  Administered 2014-11-15: 250 mL via INTRAVENOUS
  Filled 2014-11-15: qty 250

## 2014-11-15 MED ORDER — TRACE MINERALS CR-CU-F-FE-I-MN-MO-SE-ZN IV SOLN
INTRAVENOUS | Status: AC
  Administered 2014-11-15: 17:00:00 via INTRAVENOUS
  Filled 2014-11-15: qty 2000

## 2014-11-15 NOTE — Progress Notes (Signed)
Patient ID: Sonya Larson, female   DOB: 1951-09-04, 63 y.o.   MRN: [id number] 13 Days Post-Op  Subjective: reglan has helped nausea.    Objective: Vital signs in last 24 hours: Temp:  [98.6 F (37 C)-99.9 F (37.7 C)] 99 F (37.2 C) (12/16 0650) Pulse Rate:  [82-89] 82 (12/16 0650) Resp:  [16-18] 16 (12/16 0650) BP: (117-146)/(65-82) 131/70 mmHg (12/16 0650) SpO2:  [98 %-100 %] 98 % (12/16 0650) Last BM Date: 11/15/14  Intake/Output from previous day: 12/15 0701 - 12/16 0700 In: 5400 [P.O.:120; I.V.:134; IV Piggyback:437; QQP:6195] Out: 4 [Drains:4] Intake/Output this shift: Total I/O In: -  Out: 2 [Drains:2]  General appearance: alert, cooperative and no distress GI: normal findings: soft, non-tender and purulent drainage from both of the 2 JP drains, scant drainage now.   Incision/Wound: without evidence of infection  Lab Results:   Recent Labs  11/13/14 0500 11/15/14 0535  WBC 8.3 10.7*  HGB 7.5* 7.9*  HCT 23.7* 24.9*  PLT 409* 447*   BMET  Recent Labs  11/14/14 0820 11/15/14 0535  NA 141 141  K 3.6* 3.6*  CL 103 103  CO2 26 26  GLUCOSE 140* 125*  BUN 13 15  CREATININE 0.76 0.85  CALCIUM 8.6 8.7     Studies/Results: No results found.  Anti-infectives: Anti-infectives    Start     Dose/Rate Route Frequency Ordered Stop   11/09/14 0800  vancomycin (VANCOCIN) IVPB 750 mg/150 ml premix     750 mg75 mL/hr over 120 Minutes Intravenous Every 12 hours 11/09/14 0707     11/09/14 0700  piperacillin-tazobactam (ZOSYN) IVPB 3.375 g     3.375 g12.5 mL/hr over 240 Minutes Intravenous 3 times per day 11/09/14 0655     11/02/14 1600  ceFAZolin (ANCEF) IVPB 2 g/50 mL premix     2 g100 mL/hr over 30 Minutes Intravenous Every 6 hours 11/02/14 1451 11/03/14 0453   11/02/14 0600  ceFAZolin (ANCEF) IVPB 2 g/50 mL premix     2 g100 mL/hr over 30 Minutes Intravenous On call to O.R. 11/01/14 1541 11/02/14 1207      Assessment/Plan: s/p  Procedure(s): LAPAROSCOPY DIAGNOSTIC WHIPPLE PROCEDURE Minor pancreatic leak. Full liquids..   reglan Hypokalemia. Improved.   Severe protein calorie malnutrition in complex chronic illness - on TNA.    LOS: 13 days    Essie Gehret 11/15/2014

## 2014-11-15 NOTE — Progress Notes (Signed)
Calorie Count Note  48 hour calorie count ordered.  Diet: Full Liquid Supplements: none  Breakfast 12/16: 100 kcal, 0 g protein Dinner 12/15: 25 kcal, 0 g protein Supplements: none  Total intake: 125 kcal (8% of minimum estimated needs)  0 protein (0% of minimum estimated needs)  Nutrition Dx: Inadequate oral intake related to nausea/abd pain as evidenced by PO intake < 75%, 10.4% body weight loss in 3 months; ongoing  Pt with extremely poor po intake. Vomited after Dinner 12/14 and diet was downgraded to full liquids. Pt refusing nutritional supplements due to saying that they taste too sweet. Says that she feels a tight feeling in her stomach. Will try chicken noodle soup flavored protein powder. Recommend continuation of TPN per pharmacy.   Goal: Pt to meet >/= 90% of their estimated nutrition needs   Intervention: D/C Resource Breeze, Will try [place] Protein Powder, chicken noodle soup flavor.  Laurette Schimke MS, RD, LDN

## 2014-11-15 NOTE — Progress Notes (Addendum)
[place] NOTE  Pharmacy Consult for TPN Indication: prolonged ileus  No Known Allergies  Patient Measurements: Height: 5\' 3"  (160 cm) Weight: 172 lb 6.4 oz (78.2 kg) IBW/kg (Calculated) : 52.4 Adjusted Body Weight: 62.7kg   Vital Signs: Temp: 99 F (37.2 C) (12/16 0650) Temp Source: Oral (12/16 0650) BP: 131/70 mmHg (12/16 0650) Pulse Rate: 82 (12/16 0650) Intake/Output from previous day: 12/15 0701 - 12/16 0700 In: 9604 [P.O.:120; I.V.:134; IV Piggyback:437; VWU:9811] Out: 4 [Drains:4] Intake/Output from this shift: Total I/O In: -  Out: 2 [Drains:2]  Labs:  Recent Labs  11/13/14 0500 11/15/14 0535  WBC 8.3 10.7*  HGB 7.5* 7.9*  HCT 23.7* 24.9*  PLT 409* 447*     Recent Labs  11/13/14 0500 11/14/14 0820 11/15/14 0535  NA 142 141 141  K 3.2* 3.6* 3.6*  CL 105 103 103  CO2 27 26 26   GLUCOSE 116* 140* 125*  BUN 8 13 15   CREATININE 0.88 0.76 0.85  CALCIUM 8.6 8.6 8.7  MG 2.1 2.2  --   PHOS 3.5 3.5  --   PROT 5.7*  --   --   ALBUMIN 2.2*  --   --   AST 31  --   --   ALT 29  --   --   ALKPHOS 85  --   --   BILITOT 0.4  --   --   PREALBUMIN 10.1*  --   --   TRIG 124  --   --    Estimated Creatinine Clearance: 65.3 mL/min (by C-G formula based on Cr of 0.85).    Recent Labs  11/14/14 0359 11/14/14 0744 11/14/14 2126  GLUCAP 134* 141* 143*    Insulin Requirements in the past 12 hours:  3 units SSI; CBGs and SSI dc'd  Current Nutrition:  Clinimix E 5/15 @ 75 mL/hr + 20% lipid emulsion @ 57mL/hr- provides 90g protein and 1758 kcal in a 24h period (100% of goal) Heart healthy diet -downgraded to full liquid 12/15 at 1145; resource breeze TID added 12/15 but none taken as pt has refused; only 15% of full liq dinner recorded for 12/15  Nutritional Goals:  1650-1850 kCal, 90-100 grams of protein per day per RD recommendations on 12/12  Assessment: 26 YOF s/p whipple procedure on 12/3 with persistent nausea and unable to  tolerate PO intake.   GI: Has had ~10% weight loss in past 3 months. Possible pancreatic leak with cloudy drainage and elevated lipase- improving on abx. Probable ileus/delayed emptying with persistent nausea. Poor intake on full liquid diet.  Prealbumin 10.1 (12/14)   Endo: A1C 5.5 from November of this year. CBGs/12h: 143 - currently on resistant SSI qhs only and 10 units insulin in TPN bag;   Lytes: K 3.6 after 40 po kdur yest, mag 2.2, phos 3.5, Na and Cl nml, CorCa 9.9 (Ca x phos product = 35).   Renal: SCr 0.85, est CrCl ~51mL/min. UOP not accurately charted; on 1/2 NS at [place]: 96/RA; she is only taking Symbicort PRN PTA (not the standard approved way to take maintenance inhalers)  Cards: VSS- not on any meds currently. PTA she was on lisinopril and HCTZ along with statin  Hepatobil: LFTs wnl except albumin low at 2.2; trigs 124 (12/14)  Neuro: A&O, has hx of anxiety and depression  ID:  On vancomycin and Zosyn D#7 for wound infection- improving, Tmax/24h: 99.9, WBC 10.7.  No cultures.  Best Practices: IV PPI, MC, Lovenox  TPN Access: R-double lumen PICC placed 12/12  TPN day#: 4 (started 12/12)  Plan:  - Continue Clinimix E 5/15 to 75 ml/hr + 20% fat emulsion at 10 ml/hr - Daily multivitamin and trace elements in TPN -kdur 20 po x 2 doses this am, TPN Thursday labs in am - take insulin out of TPN, MD has dc'd cbgs and SSI - Will f/u TPN labs - Will f/u tolerance of full liquid diet (RN to chart po intake) -continue full support with TPN until able to tolerate PO  Eudelia Bunch, Pharm.D. [phone] 11/15/2014 8:00 AM

## 2014-11-16 LAB — COMPREHENSIVE METABOLIC PANEL
ALT: 22 U/L (ref 0–35)
AST: 22 U/L (ref 0–37)
Albumin: 2.1 g/dL — ABNORMAL LOW (ref 3.5–5.2)
Alkaline Phosphatase: 97 U/L (ref 39–117)
Anion gap: 9 (ref 5–15)
BUN: 16 mg/dL (ref 6–23)
CALCIUM: 8.5 mg/dL (ref 8.4–10.5)
CO2: 26 mEq/L (ref 19–32)
CREATININE: 0.76 mg/dL (ref 0.50–1.10)
Chloride: 100 mEq/L (ref 96–112)
GFR calc non Af Amer: 87 mL/min — ABNORMAL LOW (ref >90)
GLUCOSE: 141 mg/dL — AB (ref 70–99)
Potassium: 3.7 mEq/L (ref 3.7–5.3)
Sodium: 135 mEq/L — ABNORMAL LOW (ref 137–147)
Total Bilirubin: 0.3 mg/dL (ref 0.3–1.2)
Total Protein: 5.9 g/dL — ABNORMAL LOW (ref 6.0–8.3)

## 2014-11-16 LAB — GLUCOSE, CAPILLARY: GLUCOSE-CAPILLARY: 151 mg/dL — AB (ref 70–99)

## 2014-11-16 LAB — MAGNESIUM: MAGNESIUM: 2.2 mg/dL (ref 1.5–2.5)

## 2014-11-16 LAB — PHOSPHORUS: PHOSPHORUS: 3.5 mg/dL (ref 2.3–4.6)

## 2014-11-16 MED ORDER — TRACE MINERALS CR-CU-F-FE-I-MN-MO-SE-ZN IV SOLN
INTRAVENOUS | Status: AC
  Administered 2014-11-16: 17:00:00 via INTRAVENOUS
  Filled 2014-11-16: qty 2000

## 2014-11-16 MED ORDER — METOCLOPRAMIDE HCL 5 MG/ML IJ SOLN
10.0000 mg | Freq: Three times a day (TID) | INTRAMUSCULAR | Status: DC
  Administered 2014-11-16: 10 mg via INTRAVENOUS
  Filled 2014-11-16 (×4): qty 2

## 2014-11-16 MED ORDER — POTASSIUM CHLORIDE CRYS ER 20 MEQ PO TBCR
40.0000 meq | EXTENDED_RELEASE_TABLET | Freq: Once | ORAL | Status: DC
  Filled 2014-11-16: qty 2

## 2014-11-16 MED ORDER — POTASSIUM CHLORIDE CRYS ER 10 MEQ PO TBCR
40.0000 meq | EXTENDED_RELEASE_TABLET | Freq: Once | ORAL | Status: DC
Start: 2014-11-16 — End: 2014-11-17

## 2014-11-16 MED ORDER — FAT EMULSION 20 % IV EMUL
250.0000 mL | INTRAVENOUS | Status: AC
  Administered 2014-11-16: 250 mL via INTRAVENOUS
  Filled 2014-11-16: qty 250

## 2014-11-16 MED ORDER — PIPERACILLIN-TAZOBACTAM 3.375 G IVPB
3.3750 g | Freq: Three times a day (TID) | INTRAVENOUS | Status: DC
  Administered 2014-11-16 – 2014-11-20 (×11): 3.375 g via INTRAVENOUS
  Filled 2014-11-16 (×14): qty 50

## 2014-11-16 NOTE — Progress Notes (Signed)
NUTRITION FOLLOW UP  Intervention:   Continue TPN (100% of energy/protein needs), per Pharmacy Encourage small sips and bites of liquids and bland foods as tolerated Recommend weekly or daily weights  Nutrition Dx:   Inadequate oral intake related to nausea/abd pain as evidenced by PO intake < 75%, 10.4% body weight loss in 3 months; ongoing  Goal:   Pt to meet >/= 90% of their estimated nutrition needs   Monitor:   TPN, labs, PO intake/tolerance, weight trend, I/O's  Assessment:   23 YOF s/p whipple procedure on 12/3 with persistent nausea and unable to tolerate PO intake.   Pt reports ongoing nausea and inability to tolerate PO's today. She reports that her Reglan dose was doubled and her nausea has worsened. She reports vomiting orange juice this morning. She plans to attempt to eat crackers later today. Encouraged pt to continue taking small sips of liquids and bites of food throughout the day as tolerated. She declines all nutritional supplements at this time.  Encouraged pt to request return visit from nutrition staff via RN if she changes her mind.  Pt continues to receiving TPN; currently infusing is Clinimix E 5/15 @ 75 mL/hr + 20% lipid emulsion @ 8mL/hr- provides 90g protein and 1758 kcal in a 24h period (100% of goal).   Labs: low sodium, low albumin, glucose ranging 125 to 144 mg/dL, low hemoglobin  Height: Ht Readings from Last 1 Encounters:  11/04/14 5\' 3"  (1.6 m)    Weight Status:   Wt Readings from Last 1 Encounters:  11/04/14 172 lb 6.4 oz (78.2 kg)    Re-estimated needs:  Kcal: 1650-1850 Protein: 90-100 gram Fluid: 2.3 L daily  Skin: closed incisions on abdomen  Diet Order: TPN (CLINIMIX-E) Adult TPN (CLINIMIX-E) Adult DIET SOFT   Intake/Output Summary (Last 24 hours) at 11/16/14 1556 Last data filed at 11/16/14 0530  Gross per 24 hour  Intake      0 ml  Output      0 ml  Net      0 ml    Last BM: 12/16   Labs:   Recent Labs Lab  11/13/14 0500 11/14/14 0820 11/15/14 0535 11/16/14 0500  NA 142 141 141 135*  K 3.2* 3.6* 3.6* 3.7  CL 105 103 103 100  CO2 27 26 26 26   BUN 8 13 15 16   CREATININE 0.88 0.76 0.85 0.76  CALCIUM 8.6 8.6 8.7 8.5  MG 2.1 2.2  --  2.2  PHOS 3.5 3.5  --  3.5  GLUCOSE 116* 140* 125* 141*    CBG (last 3)   Recent Labs  11/14/14 0744 11/14/14 2126 11/15/14 2155  GLUCAP 141* 143* 144*    Scheduled Meds: . enoxaparin (LOVENOX) injection  40 mg Subcutaneous Q24H  . lipase/protease/amylase  24,000 Units Oral TID AC  . metoCLOPramide (REGLAN) injection  10 mg Intravenous TID AC & HS  . pantoprazole (PROTONIX) IV  40 mg Intravenous Q24H  . piperacillin-tazobactam (ZOSYN)  IV  3.375 g Intravenous Q8H  . potassium chloride  40 mEq Oral Once  . sodium chloride  10-40 mL Intracatheter Q12H  . vancomycin  750 mg Intravenous Q12H    Continuous Infusions: . sodium chloride 10 mL/hr at 11/12/14 1807  . Marland KitchenTPN (CLINIMIX-E) Adult 75 mL/hr at 11/15/14 1718   And  . fat emulsion 250 mL (11/15/14 1718)  . Marland KitchenTPN (CLINIMIX-E) Adult     And  . fat emulsion      Ethelda Deangelo Charissa Bash  RD, LDN Inpatient Clinical Dietitian Pager: [phone] After Hours Pager: [phone]

## 2014-11-16 NOTE — Progress Notes (Signed)
Patient ID: Sonya Larson, female   DOB: 1951/09/09, 63 y.o.   MRN: [id number] 14 Days Post-Op  Subjective: reglan has helped nausea.    Objective: Vital signs in last 24 hours: Temp:  [98.5 F (36.9 C)-99.5 F (37.5 C)] 98.5 F (36.9 C) (12/17 0530) Pulse Rate:  [85-93] 93 (12/17 0530) Resp:  [15-17] 16 (12/17 0530) BP: (128-140)/(71-80) 133/73 mmHg (12/17 0530) SpO2:  [93 %-99 %] 93 % (12/17 0530) Last BM Date: 11/15/14  Intake/Output from previous day: 12/16 0701 - 12/17 0700 In: 1138 [P.O.:240; I.V.:273; TPN:625] Out: 2 [Drains:2] Intake/Output this shift:    General appearance: alert, cooperative and no distress GI: normal findings: soft, non-tender and purulent drainage from both of the 2 JP drains, scant drainage now.   Incision/Wound: without evidence of infection  Lab Results:   Recent Labs  11/15/14 0535  WBC 10.7*  HGB 7.9*  HCT 24.9*  PLT 447*   BMET  Recent Labs  11/15/14 0535 11/16/14 0500  NA 141 135*  K 3.6* 3.7  CL 103 100  CO2 26 26  GLUCOSE 125* 141*  BUN 15 16  CREATININE 0.85 0.76  CALCIUM 8.7 8.5     Studies/Results: No results found.  Anti-infectives: Anti-infectives    Start     Dose/Rate Route Frequency Ordered Stop   11/16/14 1700  piperacillin-tazobactam (ZOSYN) IVPB 3.375 g     3.375 g12.5 mL/hr over 240 Minutes Intravenous Every 8 hours 11/16/14 0844     11/09/14 0800  vancomycin (VANCOCIN) IVPB 750 mg/150 ml premix     750 mg75 mL/hr over 120 Minutes Intravenous Every 12 hours 11/09/14 0707     11/09/14 0700  piperacillin-tazobactam (ZOSYN) IVPB 3.375 g  Status:  Discontinued     3.375 g12.5 mL/hr over 240 Minutes Intravenous 3 times per day 11/09/14 0655 11/16/14 0844   11/02/14 1600  ceFAZolin (ANCEF) IVPB 2 g/50 mL premix     2 g100 mL/hr over 30 Minutes Intravenous Every 6 hours 11/02/14 1451 11/03/14 0453   11/02/14 0600  ceFAZolin (ANCEF) IVPB 2 g/50 mL premix     2 g100 mL/hr over 30 Minutes Intravenous On  call to O.R. 11/01/14 1541 11/02/14 1207      Assessment/Plan: s/p Procedure(s): LAPAROSCOPY DIAGNOSTIC WHIPPLE PROCEDURE Minor pancreatic leak. Advance to soft diet with crackers and toast Increase reglan Hypokalemia. Improved.   Severe protein calorie malnutrition in complex chronic illness - on TNA.  See if she can get home TNA.  LOS: 14 days    Bruk Tumolo 11/16/2014

## 2014-11-16 NOTE — Progress Notes (Signed)
[place] NOTE  Pharmacy Consult for TPN Indication: prolonged ileus  No Known Allergies  Patient Measurements: Height: 5\' 3"  (160 cm) Weight: 172 lb 6.4 oz (78.2 kg) IBW/kg (Calculated) : 52.4 Adjusted Body Weight: 62.7kg   Vital Signs: Temp: 98.5 F (36.9 C) (12/17 0530) Temp Source: Oral (12/16 2149) BP: 133/73 mmHg (12/17 0530) Pulse Rate: 93 (12/17 0530) Intake/Output from previous day: 12/16 0701 - 12/17 0700 In: 1138 [P.O.:240; I.V.:273; TPN:625] Out: 2 [Drains:2] Intake/Output from this shift:    Labs:  Recent Labs  11/15/14 0535  WBC 10.7*  HGB 7.9*  HCT 24.9*  PLT 447*     Recent Labs  11/14/14 0820 11/15/14 0535 11/16/14 0500  NA 141 141 135*  K 3.6* 3.6* 3.7  CL 103 103 100  CO2 26 26 26   GLUCOSE 140* 125* 141*  BUN 13 15 16   CREATININE 0.76 0.85 0.76  CALCIUM 8.6 8.7 8.5  MG 2.2  --  2.2  PHOS 3.5  --  3.5  PROT  --   --  5.9*  ALBUMIN  --   --  2.1*  AST  --   --  22  ALT  --   --  22  ALKPHOS  --   --  97  BILITOT  --   --  0.3   Estimated Creatinine Clearance: 69.4 mL/min (by C-G formula based on Cr of 0.76).    Recent Labs  11/14/14 0744 11/14/14 2126 11/15/14 2155  GLUCAP 141* 143* 144*    Insulin Requirements in the past 12 hours:  CBGs and SSI dc'd  Current Nutrition:  Clinimix E 5/15 @ 75 mL/hr + 20% lipid emulsion @ 60mL/hr- provides 90g protein and 1758 kcal in a 24h period (100% of goal) Full liquid diet - taking minimal PO  Nutritional Goals:  1650-1850 kCal, 90-100 grams of protein per day per RD recommendations on 12/12  Assessment: 14 YOF s/p whipple procedure on 12/3 with persistent nausea and unable to tolerate PO intake.   GI: Hx GERD - Has had ~10% weight loss in past 3 months. Possible pancreatic leak with cloudy drainage and elevated lipase- improving on abx. Probable ileus/delayed emptying with persistent nausea. Poor intake on full liquid diet. Reglan Q6H, creon with food Endo:  A1C 5.5 from November of this year. CBG checks dc'd, AM glucose 141 Lytes: K 3.7, Mg 2.2, Phos 3.5, CorCa 10 (Ca x phos product = 35) Renal: SCr 0.76, UOP not accurately charted; on 1/2 NS at [place]: Hx asthma - 93% RA - taking Symbicort PRN PTA (not the standard approved way to take maintenance inhalers) Cards: Hx HTN, HLD - BP 133/73, HR 93 (PTA lisinopril, HCTZ, lipitor) Hepatobil: LFTs WNL, trigs 124, prealbumin 10.1 (12/14) Neuro: Hx depression, anxiety - WDL  ID:  Vanc/zosyn D#8 for wound infection - Afebrile, WBC 10.7, VT = 14.1 on 12/14, no cultures Best Practices: IV PPI, Lovenox TPN Access: R-double lumen PICC placed 12/12 TPN day#: 5 (started 12/12)  Plan:  - Continue Clinimix E 5/15 to 75 ml/hr + 20% fat emulsion at 10 ml/hr - Daily multivitamin and trace elements in TPN - KDur 109meq x 1  - F/u AM BMET - F/u oral diet  Salome Arnt, PharmD, BCPS Pager # [phone] 11/16/2014 7:54 AM

## 2014-11-16 NOTE — Progress Notes (Signed)
   11/16/14 0900  Clinical Encounter Type  Visited With Patient  Visit Type Initial;Spiritual support  Spiritual Encounters  Spiritual Needs Prayer  Stress Factors  Patient Stress Factors Health changes   Chaplain visited with patient briefly today. Patient explained that she is struggling to have an appetite and no matter what she does she can't get over the obstacle of being able to eat the way she needs to. Patient asked for chaplain to pray with her. Chaplain prayed with patient. Patient also stated that she has a husband who has been with her everyday of her hospitalization and is very supportive. Chaplain will continue to provide emotional and spiritual support for patient as needed. Gar Ponto, Chaplain  9:50 AM

## 2014-11-17 LAB — BASIC METABOLIC PANEL
Anion gap: 10 (ref 5–15)
BUN: 16 mg/dL (ref 6–23)
CO2: 26 meq/L (ref 19–32)
Calcium: 8.7 mg/dL (ref 8.4–10.5)
Chloride: 101 mEq/L (ref 96–112)
Creatinine, Ser: 0.8 mg/dL (ref 0.50–1.10)
GFR calc Af Amer: 88 mL/min — ABNORMAL LOW (ref >90)
GFR calc non Af Amer: 76 mL/min — ABNORMAL LOW (ref >90)
Glucose, Bld: 142 mg/dL — ABNORMAL HIGH (ref 70–99)
Potassium: 3.5 mEq/L — ABNORMAL LOW (ref 3.7–5.3)
Sodium: 137 mEq/L (ref 137–147)

## 2014-11-17 LAB — GLUCOSE, CAPILLARY: Glucose-Capillary: 176 mg/dL — ABNORMAL HIGH (ref 70–99)

## 2014-11-17 MED ORDER — FAT EMULSION 20 % IV EMUL
250.0000 mL | INTRAVENOUS | Status: AC
  Administered 2014-11-17: 250 mL via INTRAVENOUS
  Filled 2014-11-17: qty 250

## 2014-11-17 MED ORDER — SCOPOLAMINE 1 MG/3DAYS TD PT72
1.0000 | MEDICATED_PATCH | TRANSDERMAL | Status: DC | PRN
  Administered 2014-11-17: 1.5 mg via TRANSDERMAL
  Filled 2014-11-17 (×3): qty 1

## 2014-11-17 MED ORDER — POTASSIUM CHLORIDE 10 MEQ/50ML IV SOLN
10.0000 meq | INTRAVENOUS | Status: AC
  Administered 2014-11-17 (×2): 10 meq via INTRAVENOUS
  Filled 2014-11-17 (×3): qty 50

## 2014-11-17 MED ORDER — CALCIUM CARBONATE ANTACID 500 MG PO CHEW
2.0000 | CHEWABLE_TABLET | Freq: Four times a day (QID) | ORAL | Status: DC | PRN
  Administered 2014-11-17: 400 mg via ORAL
  Filled 2014-11-17 (×2): qty 1

## 2014-11-17 MED ORDER — TRACE MINERALS CR-CU-F-FE-I-MN-MO-SE-ZN IV SOLN
INTRAVENOUS | Status: AC
  Administered 2014-11-17: 18:00:00 via INTRAVENOUS
  Filled 2014-11-17: qty 2000

## 2014-11-17 NOTE — Progress Notes (Signed)
Patient ID: Sonya Larson, female   DOB: [birth year], 63 y.o.   MRN: [id number] Patient ID: Sonya Larson, female   DOB: 1951/03/07, 63 y.o.   MRN: [id number] 15 Days Post-Op  Subjective: Nausea still on and off.  Stopped regaln.    Objective: Vital signs in last 24 hours: Temp:  [98.3 F (36.8 C)-99.1 F (37.3 C)] 99.1 F (37.3 C) (12/18 1329) Pulse Rate:  [84-88] 88 (12/18 1329) Resp:  [16-19] 16 (12/18 1329) BP: (131-143)/(72-80) 143/80 mmHg (12/18 1329) SpO2:  [97 %-100 %] 100 % (12/18 1329) Last BM Date: 11/15/14  Intake/Output from previous day: 12/17 0701 - 12/18 0700 In: 1344 [I.V.:330; IV Piggyback:150; TPN:864] Out: 20 [Drains:20] Intake/Output this shift:    General appearance: alert, cooperative and no distress GI: normal findings: soft, non-tender and purulent drainage from both of the 2 JP drains, scant drainage now.   Incision/Wound: without evidence of infection  Lab Results:   Recent Labs  11/15/14 0535  WBC 10.7*  HGB 7.9*  HCT 24.9*  PLT 447*   BMET  Recent Labs  11/16/14 0500 11/17/14 0535  NA 135* 137  K 3.7 3.5*  CL 100 101  CO2 26 26  GLUCOSE 141* 142*  BUN 16 16  CREATININE 0.76 0.80  CALCIUM 8.5 8.7     Studies/Results: No results found.  Anti-infectives: Anti-infectives    Start     Dose/Rate Route Frequency Ordered Stop   11/16/14 1700  piperacillin-tazobactam (ZOSYN) IVPB 3.375 g     3.375 g12.5 mL/hr over 240 Minutes Intravenous Every 8 hours 11/16/14 0844     11/09/14 0800  vancomycin (VANCOCIN) IVPB 750 mg/150 ml premix     750 mg75 mL/hr over 120 Minutes Intravenous Every 12 hours 11/09/14 0707     11/09/14 0700  piperacillin-tazobactam (ZOSYN) IVPB 3.375 g  Status:  Discontinued     3.375 g12.5 mL/hr over 240 Minutes Intravenous 3 times per day 11/09/14 0655 11/16/14 0844   11/02/14 1600  ceFAZolin (ANCEF) IVPB 2 g/50 mL premix     2 g100 mL/hr over 30 Minutes Intravenous Every 6 hours 11/02/14 1451 11/03/14 0453    11/02/14 0600  ceFAZolin (ANCEF) IVPB 2 g/50 mL premix     2 g100 mL/hr over 30 Minutes Intravenous On call to O.R. 11/01/14 1541 11/02/14 1207      Assessment/Plan: s/p Procedure(s): LAPAROSCOPY DIAGNOSTIC WHIPPLE PROCEDURE Minor pancreatic leak. soft diet with crackers and toast as tolerated D/c reglan Hypokalemia. Improved.   Severe protein calorie malnutrition in complex chronic illness - on TNA.  S Home Monday with TNA if diet not better.  LOS: 15 days    Brock Mokry 11/17/2014

## 2014-11-17 NOTE — Progress Notes (Signed)
[facility]  Patient Status: NEW PT FOR [facility] THIS ADMISSION  [facility] is providing the following services: HHRN AND HOME INFUSION PHARMACY FOR HOME TNA. [facility] hospital infusion coordinator will provide in hospital teaching regarding home TNA to support pt and husband independence at home.  [facility] hospital team will follow pt to support transition home.  If pt discharges over weekend, [facility] is prepared but will need TNA orders to ready "TNA PER [facility] PROTOCOL FOR LABS AND DOSING". Thank you.   If patient discharges after hours, please call [phone].   Larry Sierras 11/17/2014, 7:47 AM

## 2014-11-17 NOTE — Progress Notes (Signed)
Pt had nausea most of day, bm, emesis x1 with brushing teeth.  Intake, breakfast held down liquid, lunch liquid, dinner tolerated small amt of solids, 4 bites chicken & 4 bites mashed potato.

## 2014-11-17 NOTE — Progress Notes (Signed)
[place] NOTE  Pharmacy Consult for TPN Indication: prolonged ileus  No Known Allergies  Patient Measurements: Height: 5\' 3"  (160 cm) Weight: 172 lb 6.4 oz (78.2 kg) IBW/kg (Calculated) : 52.4 Adjusted Body Weight: 62.7kg   Vital Signs: Temp: 98.3 F (36.8 C) (12/18 0601) Temp Source: Oral (12/18 0601) BP: 131/72 mmHg (12/18 0601) Pulse Rate: 84 (12/18 0601) Intake/Output from previous day: 12/17 0701 - 12/18 0700 In: 1344 [I.V.:330; IV Piggyback:150; TPN:864] Out: 20 [Drains:20] Intake/Output from this shift:    Labs:  Recent Labs  11/15/14 0535  WBC 10.7*  HGB 7.9*  HCT 24.9*  PLT 447*     Recent Labs  11/14/14 0820 11/15/14 0535 11/16/14 0500 11/17/14 0535  NA 141 141 135* 137  K 3.6* 3.6* 3.7 3.5*  CL 103 103 100 101  CO2 26 26 26 26   GLUCOSE 140* 125* 141* 142*  BUN 13 15 16 16   CREATININE 0.76 0.85 0.76 0.80  CALCIUM 8.6 8.7 8.5 8.7  MG 2.2  --  2.2  --   PHOS 3.5  --  3.5  --   PROT  --   --  5.9*  --   ALBUMIN  --   --  2.1*  --   AST  --   --  22  --   ALT  --   --  22  --   ALKPHOS  --   --  97  --   BILITOT  --   --  0.3  --    Estimated Creatinine Clearance: 69.4 mL/min (by C-G formula based on Cr of 0.8).    Recent Labs  11/14/14 2126 11/15/14 2155 11/16/14 2247  GLUCAP 143* 144* 151*   Insulin Requirements in the past 12 hours:  CBGs and SSI dc'd  Current Nutrition:  Clinimix E 5/15 @ 75 mL/hr + 20% lipid emulsion @ 85mL/hr- provides 90g protein and 1758 kcal in a 24h period (100% of goal) Soft diet - taking minimal PO  Nutritional Goals:  1650-1850 kCal, 90-100 grams of protein per day per RD recommendations on 12/12  Assessment: 71 YOF s/p whipple procedure on 12/3 with persistent nausea and unable to tolerate PO intake.   GI: Hx GERD - Has had ~10% weight loss in past 3 months. Possible pancreatic leak with cloudy drainage and elevated lipase- improving on abx. Probable ileus/delayed emptying  with persistent nausea. Poor intake on soft diet. Reglan with meals and at bedtime helps, creon with meals but still taking minimal PO, may need home TPN Endo: A1C 5.5 from November of this year. CBG checks dc'd, AM glucose 142 Lytes: K 3.5 (refused supplementation yesterday - will attempt IV today), Mg 2.2, Phos 3.5, CorCa 10.22 (Ca x phos product = 35.8)  Renal: SCr 0.8, UOP not accurately charted; 1/2 NS at [place]: Hx asthma - 97% RA - taking Symbicort PRN PTA (not the standard approved way to take maintenance inhalers) Cards: Hx HTN, HLD - BP 131/72, HR 84 (PTA lisinopril, HCTZ, lipitor) Hepatobil: LFTs WNL, trigs 124, prealbumin 10.1 (12/14) Neuro: Hx depression, anxiety - WDL  ID:  Vanc/zosyn D#9 for wound infection - Afebrile, WBC 10.7 as of 10.16, VT = 14.1 on 12/14, no cultures Best Practices: IV PPI, Lovenox TPN Access: R-double lumen PICC placed 12/12 TPN day#: 6 (started 12/12)  Plan:  - Initiate cyclic TPN in anticipation of patient going home on TPN - clinimix E5/15 at 33ml/hr x 1 hour then 156ml/hr x 16  hours then 7ml/hr x 1 hour (total volume to be infused 1779ml) + lipids 20% at 13.66ml/hr  - Daily multivitamin and trace elements in TPN - Potassium chloride 93meq Q1H x 3 doses (refused PO supplementation yesterday) - F/u AM BMET - F/u oral diet - F/u planned LOT of antibiotic therapy - today is D#9  Salome Arnt, PharmD, BCPS Pager # [phone] 11/17/2014 7:37 AM

## 2014-11-17 NOTE — Progress Notes (Signed)
ANTIBIOTIC CONSULT NOTE - FOLLOW UP  Pharmacy Consult for Vancomycin Indication: Wound infection  No Known Allergies  Patient Measurements: Height: 5\' 3"  (160 cm) Weight: 172 lb 6.4 oz (78.2 kg) IBW/kg (Calculated) : 52.4 Adjusted Body Weight:    Vital Signs: Temp: 99.1 F (37.3 C) (12/18 1329) Temp Source: Oral (12/18 1329) BP: 143/80 mmHg (12/18 1329) Pulse Rate: 88 (12/18 1329) Intake/Output from previous day: 12/17 0701 - 12/18 0700 In: 1344 [I.V.:330; IV Piggyback:150; TPN:864] Out: 20 [Drains:20] Intake/Output from this shift:    Labs:  Recent Labs  11/15/14 0535 11/16/14 0500 11/17/14 0535  WBC 10.7*  --   --   HGB 7.9*  --   --   PLT 447*  --   --   CREATININE 0.85 0.76 0.80   Estimated Creatinine Clearance: 69.4 mL/min (by C-G formula based on Cr of 0.8). No results for input(s): VANCOTROUGH, VANCOPEAK, VANCORANDOM, GENTTROUGH, GENTPEAK, GENTRANDOM, TOBRATROUGH, TOBRAPEAK, TOBRARND, AMIKACINPEAK, AMIKACINTROU, AMIKACIN in the last 72 hours.   Microbiology: Recent Results (from the past 720 hour(s))  MRSA PCR Screening     Status: None   Collection Time: 11/02/14  3:22 PM  Result Value Ref Range Status   MRSA by PCR NEGATIVE NEGATIVE Final    Comment:        The GeneXpert MRSA Assay (FDA approved for NASAL specimens only), is one component of a comprehensive MRSA colonization surveillance program. It is not intended to diagnose MRSA infection nor to guide or monitor treatment for MRSA infections.     Anti-infectives    Start     Dose/Rate Route Frequency Ordered Stop   11/16/14 1700  piperacillin-tazobactam (ZOSYN) IVPB 3.375 g     3.375 g12.5 mL/hr over 240 Minutes Intravenous Every 8 hours 11/16/14 0844     11/09/14 0800  vancomycin (VANCOCIN) IVPB 750 mg/150 ml premix     750 mg75 mL/hr over 120 Minutes Intravenous Every 12 hours 11/09/14 0707     11/09/14 0700  piperacillin-tazobactam (ZOSYN) IVPB 3.375 g  Status:  Discontinued     3.375  g12.5 mL/hr over 240 Minutes Intravenous 3 times per day 11/09/14 0655 11/16/14 0844   11/02/14 1600  ceFAZolin (ANCEF) IVPB 2 g/50 mL premix     2 g100 mL/hr over 30 Minutes Intravenous Every 6 hours 11/02/14 1451 11/03/14 0453   11/02/14 0600  ceFAZolin (ANCEF) IVPB 2 g/50 mL premix     2 g100 mL/hr over 30 Minutes Intravenous On call to O.R. 11/01/14 1541 11/02/14 1207      Assessment: 63 y/o F s/p whipple procedure on antibiotics for wound infection. She continues on vancomycin + zosyn for wound infection. Today is D#9 of therapy. Pt is afebrile and WBC is 10.7 as of 10/16. Renal function has been stable. No cultures available.   Vanc 12/10>> Zosyn 12/10>>  Goal of Therapy:  Vancomycin trough level 10-15 mcg/ml  Plan:  1. Continue vancomycin 750mg  IV Q12H 2. Continue zosyn 3.375gm IV Q8H (4 hr inf) 3. F/u renal fxn, C&S, clinical status and weekly trough 4. MD - Please address planned LOT  Salome Arnt, PharmD, BCPS Pager # [phone] 11/17/2014 2:23 PM

## 2014-11-18 LAB — CBC
HCT: 23.9 % — ABNORMAL LOW (ref 36.0–46.0)
HEMOGLOBIN: 7.5 g/dL — AB (ref 12.0–15.0)
MCH: 29 pg (ref 26.0–34.0)
MCHC: 31.4 g/dL (ref 30.0–36.0)
MCV: 92.3 fL (ref 78.0–100.0)
Platelets: 467 10*3/uL — ABNORMAL HIGH (ref 150–400)
RBC: 2.59 MIL/uL — AB (ref 3.87–5.11)
RDW: 14.5 % (ref 11.5–15.5)
WBC: 9.4 10*3/uL (ref 4.0–10.5)

## 2014-11-18 LAB — BASIC METABOLIC PANEL
Anion gap: 11 (ref 5–15)
BUN: 19 mg/dL (ref 6–23)
CHLORIDE: 100 meq/L (ref 96–112)
CO2: 25 mEq/L (ref 19–32)
Calcium: 8.6 mg/dL (ref 8.4–10.5)
Creatinine, Ser: 0.85 mg/dL (ref 0.50–1.10)
GFR calc non Af Amer: 70 mL/min — ABNORMAL LOW (ref >90)
GFR, EST AFRICAN AMERICAN: 82 mL/min — AB (ref >90)
Glucose, Bld: 131 mg/dL — ABNORMAL HIGH (ref 70–99)
POTASSIUM: 3.7 meq/L (ref 3.7–5.3)
Sodium: 136 mEq/L — ABNORMAL LOW (ref 137–147)

## 2014-11-18 LAB — GLUCOSE, CAPILLARY: Glucose-Capillary: 139 mg/dL — ABNORMAL HIGH (ref 70–99)

## 2014-11-18 MED ORDER — TRACE MINERALS CR-CU-F-FE-I-MN-MO-SE-ZN IV SOLN
INTRAVENOUS | Status: AC
  Administered 2014-11-18: 17:00:00 via INTRAVENOUS
  Filled 2014-11-18: qty 2000

## 2014-11-18 MED ORDER — FAT EMULSION 20 % IV EMUL
250.0000 mL | INTRAVENOUS | Status: AC
  Administered 2014-11-18: 250 mL via INTRAVENOUS
  Filled 2014-11-18: qty 250

## 2014-11-18 NOTE — Progress Notes (Signed)
PARENTERAL NUTRITION CONSULT NOTE  Pharmacy Consult for TPN Indication: prolonged ileus  No Known Allergies  Patient Measurements: Height: 5\' 3"  (160 cm) Weight: 172 lb 6.4 oz (78.2 kg) IBW/kg (Calculated) : 52.4 Adjusted Body Weight: 62.7kg  Vital Signs: Temp: 98.7 F (37.1 C) (12/19 0615) Temp Source: Oral (12/19 0615) BP: 122/64 mmHg (12/19 0615) Pulse Rate: 85 (12/19 0615) Intake/Output from previous day: 12/18 0701 - 12/19 0700 In: 190 [P.O.:190] Out: 100 [Emesis/NG output:100]  Labs:  Recent Labs  11/18/14 0520  WBC 9.4  HGB 7.5*  HCT 23.9*  PLT 467*    Recent Labs  11/16/14 0500 11/17/14 0535 11/18/14 0520  NA 135* 137 136*  K 3.7 3.5* 3.7  CL 100 101 100  CO2 26 26 25   GLUCOSE 141* 142* 131*  BUN 16 16 19   CREATININE 0.76 0.80 0.85  CALCIUM 8.5 8.7 8.6  MG 2.2  --   --   PHOS 3.5  --   --   PROT 5.9*  --   --   ALBUMIN 2.1*  --   --   AST 22  --   --   ALT 22  --   --   ALKPHOS 97  --   --   BILITOT 0.3  --   --    Estimated Creatinine Clearance: 65.3 mL/min (by C-G formula based on Cr of 0.85).   Recent Labs  11/15/14 2155 11/16/14 2247 11/17/14 2129  GLUCAP 144* 151* 176*   Insulin Requirements in the past 12 hours:  CBGs and SSI dc'd  Current Nutrition:  Clinimix E 5/15 @ 75 mL/hr + 20% lipid emulsion @ 36mL/hr- provides 90g protein and 1758 kcal in a 24h period (100% of goal) Soft diet - taking minimal PO  Nutritional Goals:  1650-1850 kCal, 90-100 grams of protein per day per RD recommendations on 12/12  Assessment: 74 YOF s/p whipple procedure on 12/3 with persistent nausea and unable to tolerate PO intake.   GI: Hx GERD - Has had ~10% weight loss in past 3 months. Possible pancreatic leak with cloudy drainage and elevated lipase- improving on abx. Probable ileus/delayed emptying with persistent nausea. Poor intake on soft diet. Reglan with meals and at bedtime helps, creon with meals but still taking minimal PO, may need  home TPN.  Tolerated liquids yesterday but not to much of solids.  Endo: A1C 5.5 from November of this year. CBG checks dc'd, AM glucose 131 Lytes: K 3.7, CorCa 10.22 (Ca x phos product = 35.8)  Renal: SCr 0.8, UOP not accurately charted; 1/2 NS at [place]: Hx asthma - 97% RA - taking Symbicort PRN PTA (not the standard approved way to take maintenance inhalers) Cards: Hx HTN, HLD - BP 131/72, HR 84 (PTA lisinopril, HCTZ, lipitor) Hepatobil: LFTs WNL, trigs 124, prealbumin 10.1 (12/14) Neuro: Hx depression, anxiety - WDL  ID:  Vanc/zosyn D #10 for wound infection - Afebrile, WBC 10.7 as of 10.16, VT = 14.1 on 12/14, no cultures   Best Practices: IV PPI, Lovenox TPN Access: R-double lumen PICC placed 12/12 TPN day#: 7 (started 12/12)  Plan:  - Continue cyclic TPN in anticipation of patient going home on TPN - clinimix E5/15 at 60ml/hr x 1 hour then 162ml/hr x 16 hours then 58ml/hr x 1 hour (total volume to be infused 1726ml) + lipids 20% at 13.88ml/hr  - Daily multivitamin and trace elements in TPN - F/u AM BMET - F/u oral diet - F/u planned LOT of  antibiotic therapy - today is D #10  Rober Minion, PharmD., MS Clinical Pharmacist Pager:  [phone] Thank you for allowing pharmacy to be part of this patients care team. 11/18/2014 7:28 AM

## 2014-11-18 NOTE — Progress Notes (Signed)
Patient ID: Sonya Larson, female   DOB: 02-06-1951, 63 y.o.   MRN: [id number] [facility] Surgery Progress Note:   16 Days Post-Op  Subjective: Mental status is clear.  Patient sitting up at window with her husband.  Trying to eat and getting in small amounts.  TNA  In progress.   Objective: Vital signs in last 24 hours: Temp:  [98.7 F (37.1 C)-99.3 F (37.4 C)] 98.7 F (37.1 C) (12/19 0615) Pulse Rate:  [85-88] 85 (12/19 0615) Resp:  [16] 16 (12/19 0615) BP: (122-143)/(64-80) 122/64 mmHg (12/19 0615) SpO2:  [97 %-100 %] 97 % (12/19 0615)  Intake/Output from previous day: 12/18 0701 - 12/19 0700 In: 190 [P.O.:190] Out: 100 [Emesis/NG output:100] Intake/Output this shift:    Physical Exam: Work of breathing is normal.  JP drainage has slowed.    Lab Results:  Results for orders placed or performed during the hospital encounter of 11/02/14 (from the past 48 hour(s))  Glucose, capillary     Status: Abnormal   Collection Time: 11/16/14 10:47 PM  Result Value Ref Range   Glucose-Capillary 151 (H) 70 - 99 mg/dL   Comment 1 Notify RN   Basic metabolic panel     Status: Abnormal   Collection Time: 11/17/14  5:35 AM  Result Value Ref Range   Sodium 137 137 - 147 mEq/L   Potassium 3.5 (L) 3.7 - 5.3 mEq/L   Chloride 101 96 - 112 mEq/L   CO2 26 19 - 32 mEq/L   Glucose, Bld 142 (H) 70 - 99 mg/dL   BUN 16 6 - 23 mg/dL   Creatinine, Ser 0.80 0.50 - 1.10 mg/dL   Calcium 8.7 8.4 - 10.5 mg/dL   GFR calc non Af Amer 76 (L) >90 mL/min   GFR calc Af Amer 88 (L) >90 mL/min    Comment: (NOTE) The eGFR has been calculated using the CKD EPI equation. This calculation has not been validated in all clinical situations. eGFR's persistently <90 mL/min signify possible Chronic Kidney Disease.    Anion gap 10 5 - 15  Glucose, capillary     Status: Abnormal   Collection Time: 11/17/14  9:29 PM  Result Value Ref Range   Glucose-Capillary 176 (H) 70 - 99 mg/dL   Comment 1 Notify RN    Basic metabolic panel     Status: Abnormal   Collection Time: 11/18/14  5:20 AM  Result Value Ref Range   Sodium 136 (L) 137 - 147 mEq/L   Potassium 3.7 3.7 - 5.3 mEq/L   Chloride 100 96 - 112 mEq/L   CO2 25 19 - 32 mEq/L   Glucose, Bld 131 (H) 70 - 99 mg/dL   BUN 19 6 - 23 mg/dL   Creatinine, Ser 0.85 0.50 - 1.10 mg/dL   Calcium 8.6 8.4 - 10.5 mg/dL   GFR calc non Af Amer 70 (L) >90 mL/min   GFR calc Af Amer 82 (L) >90 mL/min    Comment: (NOTE) The eGFR has been calculated using the CKD EPI equation. This calculation has not been validated in all clinical situations. eGFR's persistently <90 mL/min signify possible Chronic Kidney Disease.    Anion gap 11 5 - 15  CBC     Status: Abnormal   Collection Time: 11/18/14  5:20 AM  Result Value Ref Range   WBC 9.4 4.0 - 10.5 K/uL   RBC 2.59 (L) 3.87 - 5.11 MIL/uL   Hemoglobin 7.5 (L) 12.0 - 15.0 g/dL  HCT 23.9 (L) 36.0 - 46.0 %   MCV 92.3 78.0 - 100.0 fL   MCH 29.0 26.0 - 34.0 pg   MCHC 31.4 30.0 - 36.0 g/dL   RDW 14.5 11.5 - 15.5 %   Platelets 467 (H) 150 - 400 K/uL    Radiology/Results: No results found.  Anti-infectives: Anti-infectives    Start     Dose/Rate Route Frequency Ordered Stop   11/16/14 1700  piperacillin-tazobactam (ZOSYN) IVPB 3.375 g     3.375 g12.5 mL/hr over 240 Minutes Intravenous Every 8 hours 11/16/14 0844     11/09/14 0800  vancomycin (VANCOCIN) IVPB 750 mg/150 ml premix     750 mg75 mL/hr over 120 Minutes Intravenous Every 12 hours 11/09/14 0707     11/09/14 0700  piperacillin-tazobactam (ZOSYN) IVPB 3.375 g  Status:  Discontinued     3.375 g12.5 mL/hr over 240 Minutes Intravenous 3 times per day 11/09/14 0655 11/16/14 0844   11/02/14 1600  ceFAZolin (ANCEF) IVPB 2 g/50 mL premix     2 g100 mL/hr over 30 Minutes Intravenous Every 6 hours 11/02/14 1451 11/03/14 0453   11/02/14 0600  ceFAZolin (ANCEF) IVPB 2 g/50 mL premix     2 g100 mL/hr over 30 Minutes Intravenous On call to O.R. 11/01/14 1541  11/02/14 1207      Assessment/Plan: Problem List: Patient Active Problem List   Diagnosis Date Noted  . Protein-calorie malnutrition, severe 11/12/2014  . Adenocarcinoma of uncinate process of pancreas 07/22/2014  . Asthma, persistent controlled 01/23/2014  . Osteoarthritis 01/23/2014  . History of depression 01/23/2014  . Essential hypertension, benign 01/23/2014  . Hyperlipidemia 01/23/2014  . Personal history of colonic polyps 01/23/2014  . Obesity (BMI 30-39.9) 01/23/2014    Continue nutritional support and observation.   16 Days Post-Op    LOS: 16 days   Matt B. Hassell Done, MD, [facility] [phone] beeper [phone]  11/18/2014 9:36 AM

## 2014-11-19 MED ORDER — FAT EMULSION 20 % IV EMUL
250.0000 mL | INTRAVENOUS | Status: AC
Start: 2014-11-19 — End: 2014-11-20
  Administered 2014-11-19: 250 mL via INTRAVENOUS
  Filled 2014-11-19: qty 250

## 2014-11-19 MED ORDER — TRACE MINERALS CR-CU-F-FE-I-MN-MO-SE-ZN IV SOLN
INTRAVENOUS | Status: AC
  Administered 2014-11-19: 18:00:00 via INTRAVENOUS
  Filled 2014-11-19: qty 2000

## 2014-11-19 NOTE — Progress Notes (Signed)
[place] NOTE  Pharmacy Consult for TPN Indication: prolonged ileus  No Known Allergies  Patient Measurements: Height: 5\' 3"  (160 cm) Weight: 172 lb 6.4 oz (78.2 kg) IBW/kg (Calculated) : 52.4 Adjusted Body Weight: 62.7kg  Vital Signs: Temp: 100 F (37.8 C) (12/20 0617) Temp Source: Oral (12/20 0617) BP: 112/56 mmHg (12/20 0617) Pulse Rate: 83 (12/20 0617) Intake/Output from previous day: 12/19 0701 - 12/20 0700 In: 2568.8 [P.O.:420; I.V.:160; IV Piggyback:200; TPN:1788.8] Out: 15 [Drains:15]  Labs:  Recent Labs  11/18/14 0520  WBC 9.4  HGB 7.5*  HCT 23.9*  PLT 467*    Recent Labs  11/17/14 0535 11/18/14 0520  NA 137 136*  K 3.5* 3.7  CL 101 100  CO2 26 25  GLUCOSE 142* 131*  BUN 16 19  CREATININE 0.80 0.85  CALCIUM 8.7 8.6   Estimated Creatinine Clearance: 65.3 mL/min (by C-G formula based on Cr of 0.85).   Recent Labs  11/16/14 2247 11/17/14 2129 11/18/14 1934  GLUCAP 151* 176* 139*   Insulin Requirements in the past 12 hours:  CBGs and SSI dc'd - no AM labs today  Current Nutrition:  Clinimix E 5/15 @ 75 mL/hr + 20% lipid emulsion @ 57mL/hr- provides 90g protein and 1758 kcal in a 24h period (100% of goal) Soft diet - taking minimal PO  Nutritional Goals:  1650-1850 kCal, 90-100 grams of protein per day per RD recommendations on 12/12  Assessment: Sonya Larson s/p whipple procedure on 12/3 with persistent nausea and unable to tolerate PO intake.   GI: Hx GERD - Has had ~10% weight loss in past 3 months - diagnosed with adenocarcinoma of the pancreas.  Now with possible pancreatic leak with cloudy drainage and elevated lipase- improving on abx. Probable ileus/delayed emptying with persistent nausea. Poor intake on soft diet. Reglan stopped, creon with meals but still taking minimal PO, may need home TPN.  Tolerated liquids yesterday but not to much of solids.  Endo: A1C 5.5 from November of this year. CBG checks dc'd, no labs  this morning Lytes:  No labs today - on order for AM  Renal:  UOP not accurately charted but several occurences documented; 1/2 NS at [place]: Hx asthma - 97% RA - taking Symbicort PRN PTA (not the standard approved way to take maintenance inhalers) Cards: Hx HTN, HLD - BP 131/72, HR 84 (PTA lisinopril, HCTZ, lipitor) Hepatobil: LFTs WNL, trigs 124, prealbumin 10.1 (12/14) Neuro: Hx depression, anxiety - WDL  ID:  Vanc/zosyn D #10 (started 12/10) for wound infection - Tmax - 100.5, VT = 14.1 on 12/14, no cultures   Best Practices: IV PPI, Lovenox TPN Access: R-double lumen PICC placed 12/12 TPN day#: 8 (started 12/12)  Plan:  - Continue cyclic TPN in anticipation of patient going home on TPN - clinimix E5/15 at 53ml/hr x 1 hour then 174ml/hr x 16 hours then 22ml/hr x 1 hour (total volume to be infused 1730ml) + lipids 20% at 13.37ml/hr  - Daily multivitamin and trace elements in TPN - F/u AM labs - F/u oral diet - F/u planned LOT of antibiotic therapy - today is D #10  Rober Minion, PharmD., MS Clinical Pharmacist Pager:  [phone] Thank you for allowing pharmacy to be part of this patients care team. 11/19/2014 7:44 AM

## 2014-11-19 NOTE — Progress Notes (Signed)
Patient ID: Sonya Larson, female   DOB: [birth year], 63 y.o.   MRN: [id number] [facility] Surgery Progress Note:   17 Days Post-Op  Subjective: Mental status is clear and alert. Main complaint is no appetite.  Eating better today than yesterday.  They are anticipating going home on TNA.   Objective: Vital signs in last 24 hours: Temp:  [98.9 F (37.2 C)-100.5 F (38.1 C)] 100 F (37.8 C) (12/20 0617) Pulse Rate:  [72-88] 83 (12/20 0617) Resp:  [16] 16 (12/20 0617) BP: (112-124)/(56-70) 112/56 mmHg (12/20 0617) SpO2:  [97 %-98 %] 97 % (12/20 0617)  Intake/Output from previous day: 12/19 0701 - 12/20 0700 In: 2568.8 [P.O.:420; I.V.:160; IV Piggyback:200; TPN:1788.8] Out: 15 [Drains:15] Intake/Output this shift:    Physical Exam: Work of breathing is not labored.  2 JPs putting out creamy tan liquid consistent with pancreatic drainage.    Lab Results:  Results for orders placed or performed during the hospital encounter of 11/02/14 (from the past 48 hour(s))  Glucose, capillary     Status: Abnormal   Collection Time: 11/17/14  9:29 PM  Result Value Ref Range   Glucose-Capillary 176 (H) 70 - 99 mg/dL   Comment 1 Notify RN   Basic metabolic panel     Status: Abnormal   Collection Time: 11/18/14  5:20 AM  Result Value Ref Range   Sodium 136 (L) 137 - 147 mEq/L   Potassium 3.7 3.7 - 5.3 mEq/L   Chloride 100 96 - 112 mEq/L   CO2 25 19 - 32 mEq/L   Glucose, Bld 131 (H) 70 - 99 mg/dL   BUN 19 6 - 23 mg/dL   Creatinine, Ser 0.85 0.50 - 1.10 mg/dL   Calcium 8.6 8.4 - 10.5 mg/dL   GFR calc non Af Amer 70 (L) >90 mL/min   GFR calc Af Amer 82 (L) >90 mL/min    Comment: (NOTE) The eGFR has been calculated using the CKD EPI equation. This calculation has not been validated in all clinical situations. eGFR's persistently <90 mL/min signify possible Chronic Kidney Disease.    Anion gap 11 5 - 15  CBC     Status: Abnormal   Collection Time: 11/18/14  5:20 AM  Result Value Ref  Range   WBC 9.4 4.0 - 10.5 K/uL   RBC 2.59 (L) 3.87 - 5.11 MIL/uL   Hemoglobin 7.5 (L) 12.0 - 15.0 g/dL   HCT 23.9 (L) 36.0 - 46.0 %   MCV 92.3 78.0 - 100.0 fL   MCH 29.0 26.0 - 34.0 pg   MCHC 31.4 30.0 - 36.0 g/dL   RDW 14.5 11.5 - 15.5 %   Platelets 467 (H) 150 - 400 K/uL  Glucose, capillary     Status: Abnormal   Collection Time: 11/18/14  7:34 PM  Result Value Ref Range   Glucose-Capillary 139 (H) 70 - 99 mg/dL   Comment 1 Notify RN     Radiology/Results: No results found.  Anti-infectives: Anti-infectives    Start     Dose/Rate Route Frequency Ordered Stop   11/16/14 1700  piperacillin-tazobactam (ZOSYN) IVPB 3.375 g     3.375 g12.5 mL/hr over 240 Minutes Intravenous Every 8 hours 11/16/14 0844     11/09/14 0800  vancomycin (VANCOCIN) IVPB 750 mg/150 ml premix     750 mg75 mL/hr over 120 Minutes Intravenous Every 12 hours 11/09/14 0707     11/09/14 0700  piperacillin-tazobactam (ZOSYN) IVPB 3.375 g  Status:  Discontinued  3.375 g12.5 mL/hr over 240 Minutes Intravenous 3 times per day 11/09/14 0655 11/16/14 0844   11/02/14 1600  ceFAZolin (ANCEF) IVPB 2 g/50 mL premix     2 g100 mL/hr over 30 Minutes Intravenous Every 6 hours 11/02/14 1451 11/03/14 0453   11/02/14 0600  ceFAZolin (ANCEF) IVPB 2 g/50 mL premix     2 g100 mL/hr over 30 Minutes Intravenous On call to O.R. 11/01/14 1541 11/02/14 1207      Assessment/Plan: Problem List: Patient Active Problem List   Diagnosis Date Noted  . Protein-calorie malnutrition, severe 11/12/2014  . Adenocarcinoma of uncinate process of pancreas 07/22/2014  . Asthma, persistent controlled 01/23/2014  . Osteoarthritis 01/23/2014  . History of depression 01/23/2014  . Essential hypertension, benign 01/23/2014  . Hyperlipidemia 01/23/2014  . Personal history of colonic polyps 01/23/2014  . Obesity (BMI 30-39.9) 01/23/2014    Hopeful discharge tomorrow on TNA.  Will sign out to Dr. Rosendo Gros.  17 Days Post-Op    LOS: 17 days    Matt B. Hassell Done, MD, [facility] [phone] beeper [phone]  11/19/2014 9:34 AM

## 2014-11-20 ENCOUNTER — Other Ambulatory Visit: Payer: Self-pay | Admitting: Family Medicine

## 2014-11-20 LAB — GLUCOSE, CAPILLARY: Glucose-Capillary: 122 mg/dL — ABNORMAL HIGH (ref 70–99)

## 2014-11-20 LAB — DIFFERENTIAL
BASOS PCT: 1 % (ref 0–1)
Basophils Absolute: 0.1 10*3/uL (ref 0.0–0.1)
EOS PCT: 3 % (ref 0–5)
Eosinophils Absolute: 0.3 10*3/uL (ref 0.0–0.7)
Lymphocytes Relative: 4 % — ABNORMAL LOW (ref 12–46)
Lymphs Abs: 0.3 10*3/uL — ABNORMAL LOW (ref 0.7–4.0)
MONO ABS: 0.8 10*3/uL (ref 0.1–1.0)
MONOS PCT: 10 % (ref 3–12)
NEUTROS PCT: 82 % — AB (ref 43–77)
Neutro Abs: 7.1 10*3/uL (ref 1.7–7.7)

## 2014-11-20 LAB — CBC
HEMATOCRIT: 23.1 % — AB (ref 36.0–46.0)
Hemoglobin: 7.2 g/dL — ABNORMAL LOW (ref 12.0–15.0)
MCH: 28.9 pg (ref 26.0–34.0)
MCHC: 31.2 g/dL (ref 30.0–36.0)
MCV: 92.8 fL (ref 78.0–100.0)
Platelets: 454 10*3/uL — ABNORMAL HIGH (ref 150–400)
RBC: 2.49 MIL/uL — ABNORMAL LOW (ref 3.87–5.11)
RDW: 14.4 % (ref 11.5–15.5)
WBC: 8.6 10*3/uL (ref 4.0–10.5)

## 2014-11-20 LAB — COMPREHENSIVE METABOLIC PANEL
ALT: 14 U/L (ref 0–35)
AST: 18 U/L (ref 0–37)
Albumin: 1.9 g/dL — ABNORMAL LOW (ref 3.5–5.2)
Alkaline Phosphatase: 117 U/L (ref 39–117)
Anion gap: 10 (ref 5–15)
BUN: 20 mg/dL (ref 6–23)
CO2: 26 meq/L (ref 19–32)
CREATININE: 1.01 mg/dL (ref 0.50–1.10)
Calcium: 8.7 mg/dL (ref 8.4–10.5)
Chloride: 101 mEq/L (ref 96–112)
GFR calc Af Amer: 66 mL/min — ABNORMAL LOW (ref >90)
GFR calc non Af Amer: 57 mL/min — ABNORMAL LOW (ref >90)
Glucose, Bld: 149 mg/dL — ABNORMAL HIGH (ref 70–99)
Potassium: 4 mEq/L (ref 3.7–5.3)
Sodium: 137 mEq/L (ref 137–147)
Total Protein: 6.1 g/dL (ref 6.0–8.3)

## 2014-11-20 LAB — TRIGLYCERIDES: Triglycerides: 129 mg/dL (ref <150)

## 2014-11-20 LAB — MAGNESIUM: MAGNESIUM: 2.2 mg/dL (ref 1.5–2.5)

## 2014-11-20 LAB — PHOSPHORUS: Phosphorus: 3.9 mg/dL (ref 2.3–4.6)

## 2014-11-20 LAB — PREALBUMIN: Prealbumin: 13.8 mg/dL — ABNORMAL LOW (ref 17.0–34.0)

## 2014-11-20 MED ORDER — HEPARIN SOD (PORK) LOCK FLUSH 100 UNIT/ML IV SOLN
250.0000 [IU] | INTRAVENOUS | Status: DC | PRN
  Filled 2014-11-20: qty 3

## 2014-11-20 MED ORDER — OXYCODONE-ACETAMINOPHEN 5-325 MG PO TABS: 1.0000 | ORAL_TABLET | ORAL | Status: DC | PRN

## 2014-11-20 MED ORDER — ONDANSETRON 8 MG PO TBDP: 8.0000 mg | ORAL_TABLET | Freq: Three times a day (TID) | ORAL | Status: DC | PRN

## 2014-11-20 MED ORDER — PANCRELIPASE (LIP-PROT-AMYL) 24000-76000 UNITS PO CPEP: 24000.0000 [IU] | ORAL_CAPSULE | Freq: Three times a day (TID) | ORAL | Status: DC

## 2014-11-20 MED ORDER — HEPARIN SOD (PORK) LOCK FLUSH 100 UNIT/ML IV SOLN
250.0000 [IU] | Freq: Every day | INTRAVENOUS | Status: DC
  Filled 2014-11-20: qty 3

## 2014-11-20 NOTE — Telephone Encounter (Signed)
Refill OK

## 2014-11-20 NOTE — Progress Notes (Signed)
Pt planned for d/c home today. HHRN for TNA confirmed with [facility].  Medicare IM (Important Message) delivered to patient today by me in anticipation of discharge.   Sandi Mariscal, RN BSN MHA CCM  Case Manager, Trauma Service/Unit [id number] [phone]

## 2014-11-20 NOTE — Progress Notes (Signed)
Discharge information and Rx's given to pt and pt's husband who is also power of attorney. Pt and husband verbalize understanding. Home health is coming out to their residence this evening. Pt and husband report that they feel ready for discharge.

## 2014-11-20 NOTE — Telephone Encounter (Signed)
Last visit 06/19/14 Last refill 09/25/14 #30 0 refill

## 2014-11-20 NOTE — Discharge Instructions (Signed)
Bulb Drain Home Care A bulb drain consists of a thin rubber tube and a soft, round bulb that creates a gentle suction. The rubber tube is placed in the area where you had surgery. A bulb is attached to the end of the tube that is outside the body. The bulb drain removes excess fluid that normally builds up in a surgical wound after surgery. The color and amount of fluid will vary. Immediately after surgery, the fluid is bright red and is a little thicker than water. It may gradually change to a yellow or pink color and become more thin and water-like. When the amount decreases to about 1 or 2 tbsp in 24 hours, your health care provider will usually remove it. DAILY CARE  Keep the bulb flat (compressed) at all times, except while emptying it. The flatness creates suction. You can flatten the bulb by squeezing it firmly in the middle and then closing the cap.  Keep sites where the tube enters the skin dry and covered with a bandage (dressing).  Secure the tube 1-2 in (2.5-5.1 cm) below the insertion sites to keep it from pulling on your stitches. The tube is stitched in place and will not slip out.  Secure the bulb as directed by your health care provider.  For the first 3 days after surgery, there usually is more fluid in the bulb. Empty the bulb whenever it becomes half full because the bulb does not create enough suction if it is too full. The bulb could also overflow. Write down how much fluid you remove each time you empty your drain. Add up the amount removed in 24 hours.  Empty the bulb at the same time every day once the amount of fluid decreases and you only need to empty it once a day. Write down the amounts and the 24-hour totals to give to your health care provider. This helps your health care provider know when the tubes can be removed. EMPTYING THE BULB DRAIN Before emptying the bulb, get a measuring cup, a piece of paper and a pen, and wash your hands.  Gently run your fingers down the  tube (stripping) to empty any drainage from the tubing into the bulb. This may need to be done several times a day to clear the tubing of clots and tissue.  Open the bulb cap to release suction, which causes it to inflate. Do not touch the inside of the cap.  Gently run your fingers down the tube (stripping) to empty any drainage from the tubing into the bulb.  Hold the cap out of the way, and pour fluid into the measuring cup.   Squeeze the bulb to provide suction.  Replace the cap.   Check the tape that holds the tube to your skin. If it is becoming loose, you can remove the loose piece of tape and apply a new one. Then, pin the bulb to your shirt.   Write down the amount of fluid you emptied out. Write down the date and each time you emptied your bulb drain. (If there are 2 bulbs, note the amount of drainage from each bulb and keep the totals separate. Your health care provider will want to know the total amounts for each drain and which tube is draining more.)   Flush the fluid down the toilet and wash your hands.   Call your health care provider once you have less than 2 tbsp of fluid collecting in the bulb drain every 24 hours. If  there is drainage around the tube site, change dressings and keep the area dry. Cleanse around tube with sterile saline and place dry gauze around site. This gauze should be changed when it is soiled. If it stays clean and unsoiled, it should still be changed daily.  SEEK MEDICAL CARE IF:  Your drainage has a bad smell or is cloudy.   You have a fever.   Your drainage is increasing instead of decreasing.   Your tube fell out.   You have redness or swelling around the tube site.   You have drainage from a surgical wound.   Your bulb drain will not stay flat after you empty it.  MAKE SURE YOU:   Understand these instructions.  Will watch your condition.  Will get help right away if you are not doing well or get worse. Document  Released: 11/14/2000 Document Revised: 04/03/2014 Document Reviewed: 04/21/2012 [facility] Patient Information 2015 [place], [place]. This information is not intended to replace advice given to you by your health care provider. Make sure you discuss any questions you have with your health care provider.

## 2014-11-20 NOTE — Progress Notes (Signed)
18 Days Post-Op  Subjective: Pt doing well.  tol small amt of PO.  TNA ongoing  Min JP output  Objective: Vital signs in last 24 hours: Temp:  [98.8 F (37.1 C)-99 F (37.2 C)] 98.8 F (37.1 C) (12/21 0531) Pulse Rate:  [70-84] 70 (12/21 0531) Resp:  [16] 16 (12/21 0531) BP: (113-124)/(62-68) 124/62 mmHg (12/21 0531) SpO2:  [98 %-99 %] 98 % (12/21 0531) Last BM Date: 11/17/14  Intake/Output from previous day: 12/20 0701 - 12/21 0700 In: 1517.3 [P.O.:300; I.V.:244.3; IV Piggyback:450; TPN:523] Out: 10 [Drains:10] Intake/Output this shift:    General appearance: alert and cooperative GI: soft, non-tender; bowel sounds normal; no masses,  no organomegaly, JP-just emptied, serous appearing  Lab Results:   Recent Labs  11/18/14 0520 11/20/14 0410  WBC 9.4 8.6  HGB 7.5* 7.2*  HCT 23.9* 23.1*  PLT 467* 454*   BMET  Recent Labs  11/18/14 0520 11/20/14 0410  NA 136* 137  K 3.7 4.0  CL 100 101  CO2 25 26  GLUCOSE 131* 149*  BUN 19 20  CREATININE 0.85 1.01  CALCIUM 8.6 8.7   Anti-infectives: Anti-infectives    Start     Dose/Rate Route Frequency Ordered Stop   11/16/14 1700  piperacillin-tazobactam (ZOSYN) IVPB 3.375 g     3.375 g12.5 mL/hr over 240 Minutes Intravenous Every 8 hours 11/16/14 0844     11/09/14 0800  vancomycin (VANCOCIN) IVPB 750 mg/150 ml premix     750 mg75 mL/hr over 120 Minutes Intravenous Every 12 hours 11/09/14 0707     11/09/14 0700  piperacillin-tazobactam (ZOSYN) IVPB 3.375 g  Status:  Discontinued     3.375 g12.5 mL/hr over 240 Minutes Intravenous 3 times per day 11/09/14 0655 11/16/14 0844   11/02/14 1600  ceFAZolin (ANCEF) IVPB 2 g/50 mL premix     2 g100 mL/hr over 30 Minutes Intravenous Every 6 hours 11/02/14 1451 11/03/14 0453   11/02/14 0600  ceFAZolin (ANCEF) IVPB 2 g/50 mL premix     2 g100 mL/hr over 30 Minutes Intravenous On call to O.R. 11/01/14 1541 11/02/14 1207      Assessment/Plan: s/p Procedure(s): LAPAROSCOPY  DIAGNOSTIC (N/A) WHIPPLE PROCEDURE (N/A) Discharge    LOS: 18 days    Rosario Jacks., Anne Hahn 11/20/2014

## 2014-11-27 ENCOUNTER — Other Ambulatory Visit: Payer: Self-pay | Admitting: *Deleted

## 2014-11-27 ENCOUNTER — Ambulatory Visit (HOSPITAL_BASED_OUTPATIENT_CLINIC_OR_DEPARTMENT_OTHER): Payer: Medicare Other | Admitting: Oncology

## 2014-11-27 ENCOUNTER — Telehealth: Payer: Self-pay | Admitting: *Deleted

## 2014-11-27 ENCOUNTER — Other Ambulatory Visit (INDEPENDENT_AMBULATORY_CARE_PROVIDER_SITE_OTHER): Payer: Self-pay | Admitting: General Surgery

## 2014-11-27 ENCOUNTER — Telehealth: Payer: Self-pay | Admitting: Oncology

## 2014-11-27 VITALS — BP 146/90 | HR 106 | Temp 98.3°F | Resp 18 | Ht 63.0 in | Wt 172.0 lb

## 2014-11-27 DIAGNOSIS — C257 Malignant neoplasm of other parts of pancreas: Secondary | ICD-10-CM

## 2014-11-27 DIAGNOSIS — C250 Malignant neoplasm of head of pancreas: Secondary | ICD-10-CM

## 2014-11-27 MED ORDER — ONDANSETRON HCL 8 MG PO TABS: 8.0000 mg | ORAL_TABLET | Freq: Three times a day (TID) | ORAL | Status: DC | PRN

## 2014-11-27 MED ORDER — PROMETHAZINE HCL 12.5 MG PO TABS: ORAL_TABLET | ORAL | Status: AC

## 2014-11-27 NOTE — Telephone Encounter (Signed)
Per staff message and POF I have scheduled appts. Advised scheduler of appts. JMW  

## 2014-11-27 NOTE — Progress Notes (Signed)
  [facility] OFFICE PROGRESS NOTE   Diagnosis: Pancreas cancer  INTERVAL HISTORY:   Sonya Larson returns as scheduled. Sonya Larson underwent a Whipple procedure 11/02/2014.Sonya Larson remains on TNA via a right arm PICC. Abdominal drains remain in place. Sonya Larson reports the drains have not been emptied for the past 4 days. No pain. Sonya Larson complains of constant nausea. The nausea is not relieved with Zofran. Sonya Larson reports Reglan caused "tightening "of Sonya Larson stomach when Sonya Larson was in the hospital.  The pathology ([id number]) confirmed a 3.2 cm poorly differentiated adenocarcinoma. Tumor was confined to the pancreatic parenchyma. The margins were negative. There was a significant treatment effect. Lymphovascular and perineural invasion were not identified. 17 lymph nodes were negative.  Sonya Larson reports Sonya Larson bowels are functioning.  Objective:  Vital signs in last 24 hours:  Blood pressure 146/90, pulse 106, temperature 98.3 F (36.8 C), temperature source Oral, resp. rate 18, height 5\' 3"  (1.6 m), weight 172 lb (78.019 kg), last menstrual period 10/31/2005, SpO2 99 %.    HEENT: No thrush Resp: Lungs clear bilaterally Cardio: Regular rate and rhythm GI: Soft, healed midline incision with Steri-Strips in place, low abdomen drains with light brown fluid Vascular: No leg edema    Portacath/PICC-without erythema  Lab Results:  Lab Results  Component Value Date   WBC 8.6 11/20/2014   HGB 7.2* 11/20/2014   HCT 23.1* 11/20/2014   MCV 92.8 11/20/2014   PLT 454* 11/20/2014   NEUTROABS 7.1 11/20/2014     Medications: I have reviewed the patient's current medications.  Assessment/Plan: 1. Adenocarcinoma the pancreas, uncinate mass, EUS April 2015 revealed a uT3,uN1 lesion with an FNA biopsy confirming adenocarcinoma  MRI abdomen 07/04/2014 confirmed a pancreas uncinate 8 mass, 11 mm portacaval lymph node, no vascular involvement, and no evidence of distant metastatic disease   PET scan 07/20/2012  with a hypermetabolic uncinate process mass and hypermetabolic periportal lymph node   Markedly elevated CA 19-9   Negative diagnostic laparoscopy 07/24/2014   Initiation of Xeloda/radiation 08/10/2014.completed 09/21/2014.  CT 10/03/2014 confirmed a decrease in the pancreas mass, stable portacaval lymph node, and apparent involvement of the superior mesenteric artery  Pancreaticoduodenectomy on 11/02/2014 confirmed a poorly differentiated adenocarcinoma,ypT2,ypN0 2. Hypertension 3. Hyperlipidemia  4. vaginal spotting summer 2015-evaluated by gynecology  5. zoster rash at June 2015  6. Asthma  7. family history of pancreas cancer 8. Nausea following the pancreaticoduodenectomy procedure 9. Anemia   Disposition:  Sonya Larson is recovering from the Whipple procedure. Sonya Larson has persistent nausea that has not responded to Zofran. Sonya Larson will use lorazepam as needed. We added Phenergan. I discussed the pathology report with Sonya Larson and Sonya Larson husband. Sonya Larson has a significant chance of developing recurrent pancreas cancer despite the R0 resection. I recommend adjuvant gemcitabine therapy.  Sonya Larson will follow-up with Dr. Barry Dienes this week to discuss drain removal. I will ask Dr. Barry Dienes to place a Port-A-Cath for administration of adjuvant chemotherapy. Sonya Larson is scheduled for an office visit and a first cycle of chemotherapy 12/21/2013. The anemia is likely secondary to surgery and malnutrition. We will check a CBC and CA 19-9 prior to chemotherapy.  Betsy Coder, MD  11/27/2014  9:52 AM

## 2014-11-27 NOTE — Telephone Encounter (Signed)
, °

## 2014-12-06 ENCOUNTER — Encounter (HOSPITAL_BASED_OUTPATIENT_CLINIC_OR_DEPARTMENT_OTHER): Payer: Self-pay | Admitting: *Deleted

## 2014-12-06 NOTE — Progress Notes (Addendum)
Spoke with Triage Jasmine DecemberSharyn Lull) about checking on pt's labs drawn 12/04/2014 and when pt should stop her TPN before coming in for surgery. Triage Nurse to fax labs from 12/04/2014 to me. Also she will discuss with Jearld Fenton Rn to call pt to tell her when to stop TPN before her surgery.

## 2014-12-10 NOTE — Discharge Summary (Signed)
Physician Discharge Summary  Patient ID: Sonya Larson MRN: [id number] DOB/AGE: 66/29/1952 66 y.o.  Admit date: 11/02/2014 Discharge date: 12/10/2014  Admission Diagnoses: Protein calorie malnutrition Delayed gastric emptying Adenocarcinoma of the pancreatic uncinate process  anemia of chronic disease.   Discharge Diagnoses:  Active Problems:   Adenocarcinoma of uncinate process of pancreas   Protein-calorie malnutrition, severe Acute blood loss anemia, acute dilutional anemia, Delayed gastric emptying   Discharged Condition: stable  Hospital Course:  Pt was admitted to the ICU following diagnostic laparoscopy and whipple.  She was stable and was transferred to the floor.  She did have issues with nausea up front.  This seemed to have resolved, and she was gradually transitioned to a liquid diet, however, she developed nausea and vomiting again and had to have her NGT reinserted.  She was started on TNA.  She was able to have this out, but had to very slowly advance back to full liquids.  She continued to be unable to tolerate much beyond scant amount of liquids, so she was discharged to home on TNA.  She was ambulatory.  Her pain was tolerated on oral meds.  She appeared to have a minor leak with some leukocytosis and grayish output from one of her drains.  She received 10 days of antibiotics.      Consults: pharmacy  Significant Diagnostic Studies: labs: see epic  Treatments: IV hydration, TPN and surgery: whipple  Discharge Exam: Blood pressure 124/62, pulse 70, temperature 98.8 F (37.1 C), temperature source Oral, resp. rate 16, height 5\' 3"  (1.6 m), weight 172 lb 6.4 oz (78.2 kg), last menstrual period 10/31/2005, SpO2 98 %. General appearance: alert, cooperative and no distress Resp: breathing comfortably Cardio: regular rate and rhythm GI: soft, approp tender, non distended.      Disposition: 01-Home or Self Care  Discharge Instructions    Diet - low sodium heart  healthy    Complete by:  As directed      Face-to-face encounter (required for Medicare/Medicaid patients)    Complete by:  As directed   I Rosario Jacks., Anne Hahn certify that this patient is under my care and that I, or a nurse practitioner or physician's assistant working with me, had a face-to-face encounter that meets the physician face-to-face encounter requirements with this patient on 11/20/2014. The encounter with the patient was in whole, or in part for the following medical condition(s) which is the primary reason for home health care (List medical condition): TNA PER [facility]OTOC[facility] FOR LABS AND DOSING.  [facility] FOR HOME TNA  The encounter with the patient was in whole, or in part, for the following medical condition, which is the primary reason for home health care:  Home TNA  I certify that, based on my findings, the following services are medically necessary home health services:  Nursing  Reason for Medically Necessary Home Health Services:  Skilled Nursing- Administration and Training of Injectable Medication  My clinical findings support the need for the above services:  OTHER SEE COMMENTS  Further, I certify that my clinical findings support that this patient is homebound due to:  Immunocompromised     Home Health    Complete by:  As directed   To provide the following care/treatments:  RN     Increase activity slowly    Complete by:  As directed             Medication List    STOP taking these medications  LORazepam 0.5 MG tablet  Commonly known as:  ATIVAN     ondansetron 8 MG tablet  Commonly known as:  ZOFRAN     potassium chloride SA 20 MEQ tablet  Commonly known as:  K-DUR,KLOR-CON      TAKE these medications        atorvastatin 10 MG tablet  Commonly known as:  LIPITOR  TAKE 1 TABLET BY MOUTH EVERY DAY     budesonide-formoterol 160-4.5 MCG/ACT inhaler  Commonly known as:  SYMBICORT  Inhale 1 puff into the lungs daily as needed  (asthma symptoms).     calcium carbonate 500 MG chewable tablet  Commonly known as:  TUMS - dosed in mg elemental calcium  Chew 2 tablets by mouth daily as needed for indigestion or heartburn.     cetirizine 10 MG tablet  Commonly known as:  ZYRTEC  Take 10 mg by mouth daily as needed for allergies or rhinitis.     hydrochlorothiazide 25 MG tablet  Commonly known as:  HYDRODIURIL  TAKE 1 TABLET BY MOUTH EVERY DAY     ibuprofen 200 MG tablet  Commonly known as:  ADVIL,MOTRIN  Take 600 mg by mouth 2 (two) times daily as needed (pain).     lisinopril 40 MG tablet  Commonly known as:  PRINIVIL,ZESTRIL  TAKE 1 TABLET BY MOUTH EVERY DAY     Pancrelipase (Lip-Prot-Amyl) 24000 UNITS Cpep  Take 1 capsule (24,000 Units total) by mouth 3 (three) times daily before meals.         SignedStark Klein 12/10/2014, 3:42 PM

## 2014-12-13 ENCOUNTER — Ambulatory Visit (HOSPITAL_COMMUNITY): Payer: Medicare Other

## 2014-12-13 ENCOUNTER — Ambulatory Visit (HOSPITAL_BASED_OUTPATIENT_CLINIC_OR_DEPARTMENT_OTHER): Payer: Medicare Other | Admitting: Anesthesiology

## 2014-12-13 ENCOUNTER — Encounter (HOSPITAL_BASED_OUTPATIENT_CLINIC_OR_DEPARTMENT_OTHER)
Admission: RE | Disposition: A | Discharge status: Home / Self Care | Payer: Self-pay | Source: Ambulatory Visit | Attending: General Surgery

## 2014-12-13 ENCOUNTER — Encounter (HOSPITAL_BASED_OUTPATIENT_CLINIC_OR_DEPARTMENT_OTHER): Payer: Self-pay | Admitting: *Deleted

## 2014-12-13 ENCOUNTER — Ambulatory Visit (HOSPITAL_BASED_OUTPATIENT_CLINIC_OR_DEPARTMENT_OTHER)
Admission: RE | Admit: 2014-12-13 | Discharge: 2014-12-13 | Disposition: A | Discharge status: Home / Self Care | Payer: Medicare Other | Source: Ambulatory Visit | Attending: General Surgery | Admitting: General Surgery

## 2014-12-13 DIAGNOSIS — E785 Hyperlipidemia, unspecified: Secondary | ICD-10-CM | POA: Diagnosis not present

## 2014-12-13 DIAGNOSIS — Z8659 Personal history of other mental and behavioral disorders: Secondary | ICD-10-CM | POA: Insufficient documentation

## 2014-12-13 DIAGNOSIS — K219 Gastro-esophageal reflux disease without esophagitis: Secondary | ICD-10-CM | POA: Diagnosis not present

## 2014-12-13 DIAGNOSIS — E669 Obesity, unspecified: Secondary | ICD-10-CM | POA: Insufficient documentation

## 2014-12-13 DIAGNOSIS — Z419 Encounter for procedure for purposes other than remedying health state, unspecified: Secondary | ICD-10-CM

## 2014-12-13 DIAGNOSIS — Z8507 Personal history of malignant neoplasm of pancreas: Secondary | ICD-10-CM | POA: Diagnosis not present

## 2014-12-13 DIAGNOSIS — Z6830 Body mass index (BMI) 30.0-30.9, adult: Secondary | ICD-10-CM | POA: Insufficient documentation

## 2014-12-13 DIAGNOSIS — Z95828 Presence of other vascular implants and grafts: Secondary | ICD-10-CM

## 2014-12-13 DIAGNOSIS — Z86010 Personal history of colonic polyps: Secondary | ICD-10-CM | POA: Diagnosis not present

## 2014-12-13 DIAGNOSIS — J45998 Other asthma: Secondary | ICD-10-CM | POA: Insufficient documentation

## 2014-12-13 DIAGNOSIS — Z923 Personal history of irradiation: Secondary | ICD-10-CM | POA: Insufficient documentation

## 2014-12-13 HISTORY — DX: Presence of other vascular implants and grafts: Z95.828

## 2014-12-13 HISTORY — PX: PORTACATH PLACEMENT: SHX2246

## 2014-12-13 SURGERY — INSERTION, TUNNELED CENTRAL VENOUS DEVICE, WITH PORT
Anesthesia: General | Site: Chest | Laterality: Left

## 2014-12-13 MED ORDER — BUPIVACAINE-EPINEPHRINE (PF) 0.5% -1:200000 IJ SOLN
INTRAMUSCULAR | Status: AC
  Filled 2014-12-13: qty 60

## 2014-12-13 MED ORDER — MIDAZOLAM HCL 2 MG/2ML IJ SOLN
INTRAMUSCULAR | Status: AC
  Filled 2014-12-13: qty 2

## 2014-12-13 MED ORDER — BUPIVACAINE-EPINEPHRINE 0.5% -1:200000 IJ SOLN
INTRAMUSCULAR | Status: DC | PRN
  Administered 2014-12-13: 13 mL

## 2014-12-13 MED ORDER — HEPARIN (PORCINE) IN NACL 2-0.9 UNIT/ML-% IJ SOLN
INTRAMUSCULAR | Status: DC | PRN
  Administered 2014-12-13: 1 via INTRAVENOUS

## 2014-12-13 MED ORDER — SODIUM CHLORIDE 0.9 % IV SOLN: 250.0000 mL | INTRAVENOUS | Status: DC | PRN

## 2014-12-13 MED ORDER — OXYCODONE HCL 5 MG/5ML PO SOLN: 5.0000 mg | Freq: Once | ORAL | Status: DC | PRN

## 2014-12-13 MED ORDER — HEPARIN SOD (PORK) LOCK FLUSH 100 UNIT/ML IV SOLN
INTRAVENOUS | Status: DC | PRN
  Administered 2014-12-13: 500 [IU] via INTRAVENOUS

## 2014-12-13 MED ORDER — FENTANYL CITRATE 0.05 MG/ML IJ SOLN
INTRAMUSCULAR | Status: AC
  Filled 2014-12-13: qty 4

## 2014-12-13 MED ORDER — HEPARIN (PORCINE) IN NACL 2-0.9 UNIT/ML-% IJ SOLN
INTRAMUSCULAR | Status: AC
  Filled 2014-12-13: qty 500

## 2014-12-13 MED ORDER — ACETAMINOPHEN 650 MG RE SUPP: 650.0000 mg | RECTAL | Status: DC | PRN

## 2014-12-13 MED ORDER — FENTANYL CITRATE 0.05 MG/ML IJ SOLN
INTRAMUSCULAR | Status: DC | PRN
Start: 2014-12-13 — End: 2014-12-13
  Administered 2014-12-13: 100 ug via INTRAVENOUS

## 2014-12-13 MED ORDER — ONDANSETRON HCL 4 MG/2ML IJ SOLN
INTRAMUSCULAR | Status: DC | PRN
  Administered 2014-12-13: 4 mg via INTRAVENOUS

## 2014-12-13 MED ORDER — HEPARIN SOD (PORK) LOCK FLUSH 100 UNIT/ML IV SOLN
INTRAVENOUS | Status: AC
  Filled 2014-12-13: qty 5

## 2014-12-13 MED ORDER — HYDROMORPHONE HCL 1 MG/ML IJ SOLN: 0.2500 mg | INTRAMUSCULAR | Status: DC | PRN

## 2014-12-13 MED ORDER — FENTANYL CITRATE 0.05 MG/ML IJ SOLN: 50.0000 ug | INTRAMUSCULAR | Status: DC | PRN

## 2014-12-13 MED ORDER — ACETAMINOPHEN 325 MG PO TABS: 650.0000 mg | ORAL_TABLET | ORAL | Status: DC | PRN

## 2014-12-13 MED ORDER — OXYCODONE HCL 5 MG PO TABS: 5.0000 mg | ORAL_TABLET | ORAL | Status: DC | PRN

## 2014-12-13 MED ORDER — SODIUM CHLORIDE 0.9 % IJ SOLN: 3.0000 mL | Freq: Two times a day (BID) | INTRAMUSCULAR | Status: DC

## 2014-12-13 MED ORDER — MIDAZOLAM HCL 5 MG/5ML IJ SOLN
INTRAMUSCULAR | Status: DC | PRN
  Administered 2014-12-13: 2 mg via INTRAVENOUS

## 2014-12-13 MED ORDER — SODIUM CHLORIDE 0.9 % IJ SOLN: 3.0000 mL | INTRAMUSCULAR | Status: DC | PRN

## 2014-12-13 MED ORDER — DEXAMETHASONE SODIUM PHOSPHATE 4 MG/ML IJ SOLN
INTRAMUSCULAR | Status: DC | PRN
  Administered 2014-12-13: 10 mg via INTRAVENOUS

## 2014-12-13 MED ORDER — CIPROFLOXACIN-DEXAMETHASONE 0.3-0.1 % OT SUSP
OTIC | Status: AC
  Filled 2014-12-13: qty 7.5

## 2014-12-13 MED ORDER — OXYCODONE-ACETAMINOPHEN 5-325 MG PO TABS: 1.0000 | ORAL_TABLET | ORAL | Status: DC | PRN

## 2014-12-13 MED ORDER — LIDOCAINE HCL (PF) 1 % IJ SOLN
INTRAMUSCULAR | Status: AC
  Filled 2014-12-13: qty 30

## 2014-12-13 MED ORDER — LACTATED RINGERS IV SOLN
INTRAVENOUS | Status: DC
  Administered 2014-12-13: 10:00:00 via INTRAVENOUS

## 2014-12-13 MED ORDER — BUPIVACAINE-EPINEPHRINE (PF) 0.25% -1:200000 IJ SOLN
INTRAMUSCULAR | Status: AC
  Filled 2014-12-13: qty 30

## 2014-12-13 MED ORDER — ONDANSETRON HCL 4 MG/2ML IJ SOLN: 4.0000 mg | Freq: Once | INTRAMUSCULAR | Status: DC | PRN

## 2014-12-13 MED ORDER — LIDOCAINE HCL (CARDIAC) 20 MG/ML IV SOLN
INTRAVENOUS | Status: DC | PRN
  Administered 2014-12-13: 80 mg via INTRAVENOUS

## 2014-12-13 MED ORDER — MIDAZOLAM HCL 2 MG/2ML IJ SOLN: 1.0000 mg | INTRAMUSCULAR | Status: DC | PRN

## 2014-12-13 MED ORDER — BUPIVACAINE HCL (PF) 0.25 % IJ SOLN
INTRAMUSCULAR | Status: AC
  Filled 2014-12-13: qty 30

## 2014-12-13 MED ORDER — EPHEDRINE SULFATE 50 MG/ML IJ SOLN
INTRAMUSCULAR | Status: DC | PRN
  Administered 2014-12-13: 10 mg via INTRAVENOUS

## 2014-12-13 MED ORDER — CEFAZOLIN SODIUM-DEXTROSE 2-3 GM-% IV SOLR
2.0000 g | INTRAVENOUS | Status: AC
  Administered 2014-12-13: 2 g via INTRAVENOUS

## 2014-12-13 MED ORDER — OXYCODONE HCL 5 MG PO TABS
5.0000 mg | ORAL_TABLET | Freq: Once | ORAL | Status: DC | PRN
Start: 2014-12-13 — End: 2014-12-13

## 2014-12-13 MED ORDER — PROPOFOL 10 MG/ML IV BOLUS
INTRAVENOUS | Status: DC | PRN
  Administered 2014-12-13: 150 mg via INTRAVENOUS

## 2014-12-13 SURGICAL SUPPLY — 44 items
BAG DECANTER FOR FLEXI CONT (MISCELLANEOUS) ×3 IMPLANT
BLADE HEX COATED 2.75 (ELECTRODE) ×3 IMPLANT
BLADE SURG 11 STRL SS (BLADE) ×3 IMPLANT
BLADE SURG 15 STRL LF DISP TIS (BLADE) ×1 IMPLANT
BLADE SURG 15 STRL SS (BLADE) ×3
CHLORAPREP W/TINT 26ML (MISCELLANEOUS) ×5 IMPLANT
COVER BACK TABLE 60X90IN (DRAPES) ×3 IMPLANT
COVER MAYO STAND STRL (DRAPES) ×3 IMPLANT
DECANTER SPIKE VIAL GLASS SM (MISCELLANEOUS) IMPLANT
DRAPE C-ARM 42X72 X-RAY (DRAPES) ×3 IMPLANT
DRAPE LAPAROTOMY TRNSV 102X78 (DRAPE) ×3 IMPLANT
DRAPE UTILITY XL STRL (DRAPES) ×3 IMPLANT
DRSG TEGADERM 4X4.75 (GAUZE/BANDAGES/DRESSINGS) IMPLANT
ELECT REM PT RETURN 9FT ADLT (ELECTROSURGICAL) ×3
ELECTRODE REM PT RTRN 9FT ADLT (ELECTROSURGICAL) ×1 IMPLANT
GLOVE BIO SURGEON STRL SZ 6 (GLOVE) ×3 IMPLANT
GLOVE BIO SURGEON STRL SZ 6.5 (GLOVE) ×1 IMPLANT
GLOVE BIO SURGEON STRL SZ7 (GLOVE) ×2 IMPLANT
GLOVE BIO SURGEONS STRL SZ 6.5 (GLOVE) ×1
GLOVE BIOGEL PI IND STRL 6.5 (GLOVE) ×1 IMPLANT
GLOVE BIOGEL PI INDICATOR 6.5 (GLOVE) ×2
GLOVE ECLIPSE 6.5 STRL STRAW (GLOVE) ×2 IMPLANT
GOWN STRL REUS W/ TWL LRG LVL3 (GOWN DISPOSABLE) ×1 IMPLANT
GOWN STRL REUS W/TWL 2XL LVL3 (GOWN DISPOSABLE) ×3 IMPLANT
GOWN STRL REUS W/TWL LRG LVL3 (GOWN DISPOSABLE) ×6
KIT PORT POWER 8FR ISP CVUE (Catheter) ×2 IMPLANT
LIQUID BAND (GAUZE/BANDAGES/DRESSINGS) ×3 IMPLANT
NDL HYPO 25X1 1.5 SAFETY (NEEDLE) ×1 IMPLANT
NEEDLE HYPO 25X1 1.5 SAFETY (NEEDLE) ×3 IMPLANT
PACK BASIN DAY SURGERY FS (CUSTOM PROCEDURE TRAY) ×3 IMPLANT
PENCIL BUTTON HOLSTER BLD 10FT (ELECTRODE) ×3 IMPLANT
SLEEVE SCD COMPRESS KNEE MED (MISCELLANEOUS) ×3 IMPLANT
SLEEVE SURGEON STRL (DRAPES) ×2 IMPLANT
SPONGE GAUZE 4X4 12PLY STER LF (GAUZE/BANDAGES/DRESSINGS) IMPLANT
SUT MNCRL AB 4-0 PS2 18 (SUTURE) ×3 IMPLANT
SUT PROLENE 2 0 SH DA (SUTURE) ×6 IMPLANT
SUT VIC AB 3-0 SH 27 (SUTURE) ×3
SUT VIC AB 3-0 SH 27X BRD (SUTURE) ×1 IMPLANT
SUT VICRYL 3-0 CR8 SH (SUTURE) IMPLANT
SYR 5ML LUER SLIP (SYRINGE) ×3 IMPLANT
SYR CONTROL 10ML LL (SYRINGE) ×3 IMPLANT
SYRINGE 10CC LL (SYRINGE) ×3 IMPLANT
TOWEL OR 17X24 6PK STRL BLUE (TOWEL DISPOSABLE) ×3 IMPLANT
TOWEL OR NON WOVEN STRL DISP B (DISPOSABLE) ×3 IMPLANT

## 2014-12-13 NOTE — Transfer of Care (Signed)
Immediate Anesthesia Transfer of Care Note  Patient: Sonya Larson  Procedure(s) Performed: Procedure(s): INSERTION PORT-A-CATH (Left)  Patient Location: PACU  Anesthesia Type:General  Level of Consciousness: awake and alert   Airway & Oxygen Therapy: Patient Spontanous Breathing and Patient connected to face mask oxygen  Post-op Assessment: Report given to PACU RN and Post -op Vital signs reviewed and stable  Post vital signs: Reviewed and stable  Complications: No apparent anesthesia complications

## 2014-12-13 NOTE — H&P (View-Only) (Signed)
Patient ID: SHAELYNN DRAGOS, female   DOB: 06-18-1951, 64 y.o.   MRN: [id number] Patient ID: HARRIET SUTPHEN, female   DOB: 12/27/1950, 64 y.o.   MRN: [id number] 15 Days Post-Op  Subjective: Nausea still on and off.  Stopped regaln.    Objective: Vital signs in last 24 hours: Temp:  [98.3 F (36.8 C)-99.1 F (37.3 C)] 99.1 F (37.3 C) (12/18 1329) Pulse Rate:  [84-88] 88 (12/18 1329) Resp:  [16-19] 16 (12/18 1329) BP: (131-143)/(72-80) 143/80 mmHg (12/18 1329) SpO2:  [97 %-100 %] 100 % (12/18 1329) Last BM Date: 11/15/14  Intake/Output from previous day: 12/17 0701 - 12/18 0700 In: 1344 [I.V.:330; IV Piggyback:150; TPN:864] Out: 20 [Drains:20] Intake/Output this shift:    General appearance: alert, cooperative and no distress GI: normal findings: soft, non-tender and purulent drainage from both of the 2 JP drains, scant drainage now.   Incision/Wound: without evidence of infection  Lab Results:   Recent Labs  11/15/14 0535  WBC 10.7*  HGB 7.9*  HCT 24.9*  PLT 447*   BMET  Recent Labs  11/16/14 0500 11/17/14 0535  NA 135* 137  K 3.7 3.5*  CL 100 101  CO2 26 26  GLUCOSE 141* 142*  BUN 16 16  CREATININE 0.76 0.80  CALCIUM 8.5 8.7     Studies/Results: No results found.  Anti-infectives: Anti-infectives    Start     Dose/Rate Route Frequency Ordered Stop   11/16/14 1700  piperacillin-tazobactam (ZOSYN) IVPB 3.375 g     3.375 g12.5 mL/hr over 240 Minutes Intravenous Every 8 hours 11/16/14 0844     11/09/14 0800  vancomycin (VANCOCIN) IVPB 750 mg/150 ml premix     750 mg75 mL/hr over 120 Minutes Intravenous Every 12 hours 11/09/14 0707     11/09/14 0700  piperacillin-tazobactam (ZOSYN) IVPB 3.375 g  Status:  Discontinued     3.375 g12.5 mL/hr over 240 Minutes Intravenous 3 times per day 11/09/14 0655 11/16/14 0844   11/02/14 1600  ceFAZolin (ANCEF) IVPB 2 g/50 mL premix     2 g100 mL/hr over 30 Minutes Intravenous Every 6 hours 11/02/14 1451 11/03/14 0453    11/02/14 0600  ceFAZolin (ANCEF) IVPB 2 g/50 mL premix     2 g100 mL/hr over 30 Minutes Intravenous On call to O.R. 11/01/14 1541 11/02/14 1207      Assessment/Plan: s/p Procedure(s): LAPAROSCOPY DIAGNOSTIC WHIPPLE PROCEDURE Minor pancreatic leak. soft diet with crackers and toast as tolerated D/c reglan Hypokalemia. Improved.   Severe protein calorie malnutrition in complex chronic illness - on TNA.  S Home Monday with TNA if diet not better.  LOS: 15 days    Sriansh Farra 11/17/2014

## 2014-12-13 NOTE — Interval H&P Note (Signed)
History and Physical Interval Note:  12/13/2014 10:03 AM  Sonya Larson  has presented today for surgery, with the diagnosis of pancreatic cancer  The various methods of treatment have been discussed with the patient and family. After consideration of risks, benefits and other options for treatment, the patient has consented to  Procedure(s): INSERTION PORT-A-CATH (N/A) as a surgical intervention .  The patient's history has been reviewed, patient examined, no change in status, stable for surgery.  I have reviewed the patient's chart and labs.  Questions were answered to the patient's satisfaction.     Vincy Feliz

## 2014-12-13 NOTE — Discharge Instructions (Addendum)
[place] Office Phone Number [phone]   POST OP INSTRUCTIONS  Always review your discharge instruction sheet given to you by the facility where your surgery was performed.  IF YOU HAVE DISABILITY OR FAMILY LEAVE FORMS, YOU MUST BRING THEM TO THE OFFICE FOR PROCESSING.  DO NOT GIVE THEM TO YOUR DOCTOR.  1. A prescription for pain medication may be given to you upon discharge.  Take your pain medication as prescribed, if needed.  If narcotic pain medicine is not needed, then you may take acetaminophen (Tylenol) or ibuprofen (Advil) as needed. 2. Take your usually prescribed medications unless otherwise directed 3. If you need a refill on your pain medication, please contact your pharmacy.  They will contact our office to request authorization.  Prescriptions will not be filled after 5pm or on week-ends. 4. You should eat very light the first 24 hours after surgery, such as soup, crackers, pudding, etc.  Resume your normal diet the day after surgery 5. It is common to experience some constipation if taking pain medication after surgery.  Increasing fluid intake and taking a stool softener will usually help or prevent this problem from occurring.  A mild laxative (Milk of Magnesia or Miralax) should be taken according to package directions if there are no bowel movements after 48 hours. 6. You may shower in 48 hours.  The surgical glue will flake off in 2-3 weeks.   7. ACTIVITIES:  No strenuous activity or heavy lifting for 1 week.   a. You may drive when you no longer are taking prescription pain medication, you can comfortably wear a seatbelt, and you can safely maneuver your car and apply brakes. b. RETURN TO WORK:  __________as tolerated_______________ Dennis Bast should see your doctor in the office for a follow-up appointment approximately three-four weeks after your surgery.    WHEN TO CALL YOUR DOCTOR: 1. Fever over 101.0 2. Nausea and/or vomiting. 3. Extreme swelling or  bruising. 4. Continued bleeding from incision. 5. Increased pain, redness, or drainage from the incision.  The clinic staff is available to answer your questions during regular business hours.  Please dont hesitate to call and ask to speak to one of the nurses for clinical concerns.  If you have a medical emergency, go to the nearest emergency room or call 911.  A surgeon from [facility] is always on call at the hospital.  For further questions, please visit [email or web address]     Post Anesthesia Home Care Instructions  Activity: Get plenty of rest for the remainder of the day. A responsible adult should stay with you for 24 hours following the procedure.  For the next 24 hours, DO NOT: -Drive a car -Paediatric nurse -Drink alcoholic beverages -Take any medication unless instructed by your physician -Make any legal decisions or sign important papers.  Meals: Start with liquid foods such as gelatin or soup. Progress to regular foods as tolerated. Avoid greasy, spicy, heavy foods. If nausea and/or vomiting occur, drink only clear liquids until the nausea and/or vomiting subsides. Call your physician if vomiting continues.  Special Instructions/Symptoms: Your throat may feel dry or sore from the anesthesia or the breathing tube placed in your throat during surgery. If this causes discomfort, gargle with warm salt water. The discomfort should disappear within 24 hours.

## 2014-12-13 NOTE — Anesthesia Preprocedure Evaluation (Signed)
Anesthesia Evaluation  Patient identified by MRN, date of birth, ID band Patient awake    Reviewed: Allergy & Precautions, NPO status , Patient's Chart, lab work & pertinent test results  Airway Mallampati: I  TM Distance: >3 FB Neck ROM: Full    Dental  (+) Teeth Intact, Dental Advisory Given   Pulmonary asthma (Exercise induced) ,  breath sounds clear to auscultation        Cardiovascular hypertension, Pt. on medications Rhythm:Regular Rate:Normal     Neuro/Psych    GI/Hepatic GERD-  Medicated,  Endo/Other    Renal/GU      Musculoskeletal   Abdominal   Peds  Hematology   Anesthesia Other Findings   Reproductive/Obstetrics                             Anesthesia Physical Anesthesia Plan  ASA: III  Anesthesia Plan: General   Post-op Pain Management:    Induction: Intravenous  Airway Management Planned: LMA  Additional Equipment:   Intra-op Plan:   Post-operative Plan: Extubation in OR  Informed Consent: I have reviewed the patients History and Physical, chart, labs and discussed the procedure including the risks, benefits and alternatives for the proposed anesthesia with the patient or authorized representative who has indicated his/her understanding and acceptance.   Dental advisory given  Plan Discussed with: CRNA, Anesthesiologist and Surgeon  Anesthesia Plan Comments:         Anesthesia Quick Evaluation

## 2014-12-13 NOTE — Progress Notes (Signed)
Came to [facility] outpatient surgery Center and flushed out RT DL PICC.    Had IVFs infusing.  Fluids stopped. Flushed 6cc NS then 250 units Heparin.

## 2014-12-13 NOTE — Op Note (Signed)
PREOPERATIVE DIAGNOSIS:  Pancreatic cancer     POSTOPERATIVE DIAGNOSIS:  Same     PROCEDURE: Left subclavian port placement, Bard ClearVue  Power Port, MRI safe, 8-French.      SURGEON:  Stark Klein, MD   ASSISTANT:  Chester Holstein, MD     ANESTHESIA:  General   FINDINGS:  Good venous return, easy flush, and tip of the catheter and   SVC 25 cm.      SPECIMEN:  None.      ESTIMATED BLOOD LOSS:  Minimal.      COMPLICATIONS:  None known.      PROCEDURE:  Pt was identified in the holding area and taken to   the operating room, where patient was placed supine on the operating room   table.  General anesthesia was induced.  Patient's arms were tucked and the upper   chest and neck were prepped and draped in sterile fashion.  Time-out was   performed according to the surgical safety check list.  When all was   correct, we continued.   Local anesthetic was administered over this   area at the angle of the clavicle.  The vein was accessed with 2 passes of the needle. There was good venous return and the wire passed easily with no ectopy.   Fluoroscopy was used to confirm that the wire was in the vena cava.      The patient was placed back level and the area for the pocket was anethetized   with local anesthetic.  A 3-cm transverse incision was made with a #15   blade.  Cautery was used to divide the subcutaneous tissues down to the   pectoralis muscle.  An [employer] retractor was used to elevate the skin   while a pocket was created on top of the pectoralis fascia.  The port   was placed into the pocket to confirm that it was of adequate size.  The   catheter was preattached to the port.  The port was then secured to the   pectoralis fascia with four 2-0 Prolene sutures.  These were clamped and   not tied down yet.    The catheter was tunneled through to the wire exit   site.  The catheter was placed along the wire to determine what length it should be to be in the SVC.  The  catheter was cut at 25 cm.  The tunneler sheath and dilator were passed over the wire and the dilator and wire were removed.  The catheter was advanced through the tunneler sheath and the tunneler sheath was pulled away.  Care was taken to keep the catheter in the tunneler sheath as this occurred. This was advanced and the tunneler sheath was removed.  There was good venous   return and easy flush of the catheter.  The Prolene sutures were tied   down to the pectoral fascia.  The skin was reapproximated using 3-0   Vicryl interrupted deep dermal sutures.    Fluoroscopy was used to re-confirm good position of the catheter.  The skin   was then closed using 4-0 Monocryl in a subcuticular fashion.  The port was flushed with concentrated heparin flush as well.  The wounds were then cleaned, dried, and dressed with Dermabond.  The patient was awakened from anesthesia and taken to the PACU in stable condition.  Needle, sponge, and instrument counts were correct.  Stark Klein, MD

## 2014-12-13 NOTE — Anesthesia Postprocedure Evaluation (Signed)
  Anesthesia Post-op Note  Patient: Sonya Larson  Procedure(s) Performed: Procedure(s): INSERTION PORT-A-CATH (Left)  Patient Location: PACU  Anesthesia Type: General   Level of Consciousness: awake, alert  and oriented  Airway and Oxygen Therapy: Patient Spontanous Breathing  Post-op Pain: none  Post-op Assessment: Post-op Vital signs reviewed  Post-op Vital Signs: Reviewed  Last Vitals:  Filed Vitals:   12/13/14 1230  BP: 107/64  Pulse: 75  Temp:   Resp: 19    Complications: No apparent anesthesia complications

## 2014-12-13 NOTE — Anesthesia Procedure Notes (Signed)
Procedure Name: LMA Insertion Date/Time: 12/13/2014 11:07 AM Performed by: Lyndee Leo Pre-anesthesia Checklist: Patient identified, Emergency Drugs available, Suction available and Patient being monitored Patient Re-evaluated:Patient Re-evaluated prior to inductionOxygen Delivery Method: Circle System Utilized Preoxygenation: Pre-oxygenation with 100% oxygen Intubation Type: IV induction Ventilation: Mask ventilation without difficulty LMA: LMA inserted LMA Size: 4.0 Number of attempts: 1 Airway Equipment and Method: Bite block Placement Confirmation: positive ETCO2 and breath sounds checked- equal and bilateral Tube secured with: Tape Dental Injury: Teeth and Oropharynx as per pre-operative assessment

## 2014-12-14 ENCOUNTER — Encounter (HOSPITAL_BASED_OUTPATIENT_CLINIC_OR_DEPARTMENT_OTHER): Payer: Self-pay | Admitting: General Surgery

## 2014-12-17 ENCOUNTER — Other Ambulatory Visit: Payer: Self-pay | Admitting: Oncology

## 2014-12-19 ENCOUNTER — Telehealth: Payer: Self-pay | Admitting: *Deleted

## 2014-12-19 DIAGNOSIS — C250 Malignant neoplasm of head of pancreas: Secondary | ICD-10-CM

## 2014-12-19 MED ORDER — LIDOCAINE-PRILOCAINE 2.5-2.5 % EX CREA: 1.0000 "application " | TOPICAL_CREAM | CUTANEOUS | Status: DC | PRN

## 2014-12-19 NOTE — Telephone Encounter (Signed)
Saw Dr. Barry Dienes last week and she agreed with patient to delay her chemo start another week. Wants to move 1/21 chemo to following week to build up her strength. OK per Dr. Benay Spice to move all 1/21 appts. To 1/27. POF to scheduler. Kirbie also requests EMLA cream for her port. Asking if OK for the TPN to be infusing at same time as chemo (TNA in PICC line) and confirmed w/her this is OK.

## 2014-12-20 ENCOUNTER — Telehealth: Payer: Self-pay | Admitting: Oncology

## 2014-12-20 NOTE — Telephone Encounter (Signed)
s.w. pt and advised on 1.27 appt and 1.21 cx.Marland KitchenMarland KitchenMarland KitchenMarland Kitchenpt ok and aware of new d.t

## 2014-12-21 ENCOUNTER — Other Ambulatory Visit: Payer: Medicare Other

## 2014-12-21 ENCOUNTER — Ambulatory Visit: Payer: Medicare Other

## 2014-12-21 ENCOUNTER — Ambulatory Visit: Payer: Medicare Other | Admitting: Nurse Practitioner

## 2014-12-24 ENCOUNTER — Other Ambulatory Visit: Payer: Self-pay | Admitting: Oncology

## 2014-12-27 ENCOUNTER — Telehealth: Payer: Self-pay | Admitting: Oncology

## 2014-12-27 ENCOUNTER — Ambulatory Visit: Payer: Medicare Other | Admitting: Nurse Practitioner

## 2014-12-27 ENCOUNTER — Other Ambulatory Visit (HOSPITAL_BASED_OUTPATIENT_CLINIC_OR_DEPARTMENT_OTHER): Payer: Medicare Other

## 2014-12-27 ENCOUNTER — Ambulatory Visit (HOSPITAL_BASED_OUTPATIENT_CLINIC_OR_DEPARTMENT_OTHER): Payer: Medicare Other

## 2014-12-27 ENCOUNTER — Ambulatory Visit (HOSPITAL_BASED_OUTPATIENT_CLINIC_OR_DEPARTMENT_OTHER): Payer: Medicare Other | Admitting: Nurse Practitioner

## 2014-12-27 ENCOUNTER — Other Ambulatory Visit: Payer: Medicare Other

## 2014-12-27 ENCOUNTER — Ambulatory Visit: Payer: Medicare Other

## 2014-12-27 VITALS — BP 132/78 | HR 87 | Temp 98.4°F | Resp 18 | Ht 63.0 in | Wt 170.9 lb

## 2014-12-27 DIAGNOSIS — C250 Malignant neoplasm of head of pancreas: Secondary | ICD-10-CM

## 2014-12-27 DIAGNOSIS — D649 Anemia, unspecified: Secondary | ICD-10-CM

## 2014-12-27 DIAGNOSIS — Z5111 Encounter for antineoplastic chemotherapy: Secondary | ICD-10-CM

## 2014-12-27 DIAGNOSIS — C257 Malignant neoplasm of other parts of pancreas: Secondary | ICD-10-CM

## 2014-12-27 DIAGNOSIS — Z452 Encounter for adjustment and management of vascular access device: Secondary | ICD-10-CM

## 2014-12-27 DIAGNOSIS — R110 Nausea: Secondary | ICD-10-CM

## 2014-12-27 LAB — CBC WITH DIFFERENTIAL/PLATELET
BASO%: 0.6 % (ref 0.0–2.0)
Basophils Absolute: 0 10*3/uL (ref 0.0–0.1)
EOS%: 3 % (ref 0.0–7.0)
Eosinophils Absolute: 0.2 10*3/uL (ref 0.0–0.5)
HCT: 30.6 % — ABNORMAL LOW (ref 34.8–46.6)
HGB: 9.6 g/dL — ABNORMAL LOW (ref 11.6–15.9)
LYMPH%: 4.8 % — AB (ref 14.0–49.7)
MCH: 26.9 pg (ref 25.1–34.0)
MCHC: 31.3 g/dL — ABNORMAL LOW (ref 31.5–36.0)
MCV: 86.2 fL (ref 79.5–101.0)
MONO#: 0.4 10*3/uL (ref 0.1–0.9)
MONO%: 6.3 % (ref 0.0–14.0)
NEUT#: 5.1 10*3/uL (ref 1.5–6.5)
NEUT%: 85.3 % — ABNORMAL HIGH (ref 38.4–76.8)
Platelets: 308 10*3/uL (ref 145–400)
RBC: 3.55 10*6/uL — ABNORMAL LOW (ref 3.70–5.45)
RDW: 15.5 % — ABNORMAL HIGH (ref 11.2–14.5)
WBC: 6 10*3/uL (ref 3.9–10.3)
lymph#: 0.3 10*3/uL — ABNORMAL LOW (ref 0.9–3.3)

## 2014-12-27 LAB — BASIC METABOLIC PANEL (CC13)
ANION GAP: 11 meq/L (ref 3–11)
BUN: 17.7 mg/dL (ref 7.0–26.0)
CALCIUM: 8.6 mg/dL (ref 8.4–10.4)
CO2: 21 mEq/L — ABNORMAL LOW (ref 22–29)
Chloride: 107 mEq/L (ref 98–109)
Creatinine: 0.7 mg/dL (ref 0.6–1.1)
EGFR: >90 mL/min/{1.73_m2} (ref >90)
Glucose: 110 mg/dl (ref 70–140)
POTASSIUM: 4 meq/L (ref 3.5–5.1)
Sodium: 140 mEq/L (ref 136–145)

## 2014-12-27 MED ORDER — PROCHLORPERAZINE MALEATE 10 MG PO TABS
ORAL_TABLET | ORAL | Status: AC
  Filled 2014-12-27: qty 1

## 2014-12-27 MED ORDER — SODIUM CHLORIDE 0.9 % IJ SOLN
10.0000 mL | INTRAMUSCULAR | Status: DC | PRN
  Administered 2014-12-27: 10 mL
  Filled 2014-12-27: qty 10

## 2014-12-27 MED ORDER — SODIUM CHLORIDE 0.9 % IJ SOLN
10.0000 mL | INTRAMUSCULAR | Status: DC | PRN
Start: 2014-12-27 — End: 2014-12-27
  Administered 2014-12-27: 10 mL via INTRAVENOUS
  Filled 2014-12-27: qty 10

## 2014-12-27 MED ORDER — PROCHLORPERAZINE MALEATE 10 MG PO TABS
10.0000 mg | ORAL_TABLET | Freq: Once | ORAL | Status: AC
  Administered 2014-12-27: 10 mg via ORAL

## 2014-12-27 MED ORDER — SODIUM CHLORIDE 0.9 % IV SOLN
Freq: Once | INTRAVENOUS | Status: AC
  Administered 2014-12-27: 13:00:00 via INTRAVENOUS

## 2014-12-27 MED ORDER — HEPARIN SOD (PORK) LOCK FLUSH 100 UNIT/ML IV SOLN
500.0000 [IU] | Freq: Once | INTRAVENOUS | Status: AC
  Administered 2014-12-27: 500 [IU] via INTRAVENOUS
  Filled 2014-12-27: qty 5

## 2014-12-27 MED ORDER — SODIUM CHLORIDE 0.9 % IV SOLN
800.0000 mg/m2 | Freq: Once | INTRAVENOUS | Status: AC
  Administered 2014-12-27: 1482 mg via INTRAVENOUS
  Filled 2014-12-27: qty 38.98

## 2014-12-27 MED ORDER — HEPARIN SOD (PORK) LOCK FLUSH 100 UNIT/ML IV SOLN
500.0000 [IU] | Freq: Once | INTRAVENOUS | Status: AC | PRN
  Administered 2014-12-27: 500 [IU]
  Filled 2014-12-27: qty 5

## 2014-12-27 NOTE — Patient Instructions (Signed)
PICC Home Guide A peripherally inserted central catheter (PICC) is a long, thin, flexible tube that is inserted into a vein in the upper arm. It is a form of intravenous (IV) access. It is considered to be a "central" line because the tip of the PICC ends in a large vein in your chest. This large vein is called the superior vena cava (SVC). The PICC tip ends in the SVC because there is a lot of blood flow in the SVC. This allows medicines and IV fluids to be quickly distributed throughout the body. The PICC is inserted using a sterile technique by a specially trained nurse or physician. After the PICC is inserted, a chest X-ray exam is done to be sure it is in the correct place.  A PICC may be placed for different reasons, such as:  To give medicines and liquid nutrition that can only be given through a central line. Examples are:  Certain antibiotic treatments.  Chemotherapy.  Total parenteral nutrition (TPN).  To take frequent blood samples.  To give IV fluids and blood products.  If there is difficulty placing a peripheral intravenous (PIV) catheter. If taken care of properly, a PICC can remain in place for several months. A PICC can also allow a person to go home from the hospital early. Medicine and PICC care can be managed at home by a family member or home health care team. WHAT PROBLEMS CAN HAPPEN WHEN I HAVE A PICC? Problems with a PICC can occasionally occur. These may include the following:  A blood clot (thrombus) forming in or at the tip of the PICC. This can cause the PICC to become clogged. A clot-dissolving medicine called tissue plasminogen activator (tPA) can be given through the PICC to help break up the clot.  Inflammation of the vein (phlebitis) in which the PICC is placed. Signs of inflammation may include redness, pain at the insertion site, red streaks, or being able to feel a "cord" in the vein where the PICC is located.  Infection in the PICC or at the insertion  site. Signs of infection may include fever, chills, redness, swelling, or pus drainage from the PICC insertion site.  PICC movement (malposition). The PICC tip may move from its original position due to excessive physical activity, forceful coughing, sneezing, or vomiting.  A break or cut in the PICC. It is important to not use scissors near the PICC.  Nerve or tendon irritation or injury during PICC insertion. WHAT SHOULD I KEEP IN MIND ABOUT ACTIVITIES WHEN I HAVE A PICC?  You may bend your arm and move it freely. If your PICC is near or at the bend of your elbow, avoid activity with repeated motion at the elbow.  Rest at home for the remainder of the day following PICC line insertion.  Avoid lifting heavy objects as instructed by your health care provider.  Avoid using a crutch with the arm on the same side as your PICC. You may need to use a walker. WHAT SHOULD I KNOW ABOUT MY PICC DRESSING?  Keep your PICC bandage (dressing) clean and dry to prevent infection.  Ask your health care provider when you may shower. Ask your health care provider to teach you how to wrap the PICC when you do take a shower.  Change the PICC dressing as instructed by your health care provider.  Change your PICC dressing if it becomes loose or wet. WHAT SHOULD I KNOW ABOUT PICC CARE?  Check the PICC insertion site   daily for leakage, redness, swelling, or pain.  Do not take a bath, swim, or use hot tubs when you have a PICC. Cover PICC line with clear plastic wrap and tape to keep it dry while showering.  Flush the PICC as directed by your health care provider. Let your health care provider know right away if the PICC is difficult to flush or does not flush. Do not use force to flush the PICC.  Do not use a syringe that is less than 10 mL to flush the PICC.  Never pull or tug on the PICC.  Avoid blood pressure checks on the arm with the PICC.  Keep your PICC identification card with you at all  times.  Do not take the PICC out yourself. Only a trained clinical professional should remove the PICC. SEEK IMMEDIATE MEDICAL CARE IF:  Your PICC is accidentally pulled all the way out. If this happens, cover the insertion site with a bandage or gauze dressing. Do not throw the PICC away. Your health care provider will need to inspect it.  Your PICC was tugged or pulled and has partially come out. Do not  push the PICC back in.  There is any type of drainage, redness, or swelling where the PICC enters the skin.  You cannot flush the PICC, it is difficult to flush, or the PICC leaks around the insertion site when it is flushed.  You hear a "flushing" sound when the PICC is flushed.  You have pain, discomfort, or numbness in your arm, shoulder, or jaw on the same side as the PICC.  You feel your heart "racing" or skipping beats.  You notice a hole or tear in the PICC.  You develop chills or a fever. MAKE SURE YOU:   Understand these instructions.  Will watch your condition.  Will get help right away if you are not doing well or get worse. Document Released: 05/24/2003 Document Revised: 04/03/2014 Document Reviewed: 07/25/2013 Exit[employer] Patient Information 2015 [employer]. This information is not intended to replace advice given to you by your health care provider. Make sure you discuss any questions you have with your health care provider.  

## 2014-12-27 NOTE — Patient Instructions (Signed)
[facility] Discharge Instructions for Patients Receiving Chemotherapy  Today you received the following chemotherapy agents Gemzar  To help prevent nausea and vomiting after your treatment, we encourage you to take your nausea medication as directed/prescribed   If you develop nausea and vomiting that is not controlled by your nausea medication, call the clinic.   BELOW ARE SYMPTOMS THAT SHOULD BE REPORTED IMMEDIATELY:  *FEVER GREATER THAN 100.5 F  *CHILLS WITH OR WITHOUT FEVER  NAUSEA AND VOMITING THAT IS NOT CONTROLLED WITH YOUR NAUSEA MEDICATION  *UNUSUAL SHORTNESS OF BREATH  *UNUSUAL BRUISING OR BLEEDING  TENDERNESS IN MOUTH AND THROAT WITH OR WITHOUT PRESENCE OF ULCERS  *URINARY PROBLEMS  *BOWEL PROBLEMS  UNUSUAL RASH Items with * indicate a potential emergency and should be followed up as soon as possible.  Feel free to call the clinic you have any questions or concerns. The clinic phone number is [phone].

## 2014-12-27 NOTE — Telephone Encounter (Signed)
gv and printed appt sched and avs for pt for Feb...sed added tx.   °

## 2014-12-27 NOTE — Progress Notes (Signed)
  [facility] OFFICE PROGRESS NOTE   Diagnosis:  Pancreas cancer   INTERVAL HISTORY:   Ms. Loeffler returns as scheduled. She underwent placement of a Port-A-Cath on 12/13/2014. She overall is feeling better. The nausea is less. She has had no vomiting for the past week. Appetite is slowly improving. She continues TPN. She denies significant abdominal pain. She has had constipation and is taking Miralax.  Objective:  Vital signs in last 24 hours: Temperature 98.4, heart rate 87, respirations 18, blood pressure 132/78, weight 170.9 pounds    HEENT: No thrush or ulcers. Resp: Lungs clear bilaterally. Cardio: Regular rate and rhythm. GI: Abdomen soft and nontender. No hepatomegaly. Healed midline incision. Vascular: No leg edema. Calves soft and nontender.  Skin: No rash. Port-A-Cath site is bandaged. Right upper extremity PICC site without erythema.    Lab Results:  Lab Results  Component Value Date   WBC 6.0 12/27/2014   HGB 9.6* 12/27/2014   HCT 30.6* 12/27/2014   MCV 86.2 12/27/2014   PLT 308 12/27/2014   NEUTROABS 5.1 12/27/2014    Imaging:  No results found.  Medications: I have reviewed the patient's current medications.  Assessment/Plan: 1. Adenocarcinoma the pancreas, uncinate mass, EUS April 2015 revealed a uT3,uN1 lesion with an FNA biopsy confirming adenocarcinoma  MRI abdomen 07/04/2014 confirmed a pancreas uncinate 8 mass, 11 mm portacaval lymph node, no vascular involvement, and no evidence of distant metastatic disease   PET scan 07/20/2012 with a hypermetabolic uncinate process mass and hypermetabolic periportal lymph node   Markedly elevated CA 19-9   Negative diagnostic laparoscopy 07/24/2014   Initiation of Xeloda/radiation 08/10/2014.completed 09/21/2014.  CT 10/03/2014 confirmed a decrease in the pancreas mass, stable portacaval lymph node, and apparent involvement of the superior mesenteric artery  Pancreaticoduodenectomy  on 11/02/2014 confirmed a poorly differentiated adenocarcinoma, ypT2,ypN0  Initiation of adjuvant gemcitabine 12/27/2014 2. Hypertension 3. Hyperlipidemia  4. vaginal spotting summer 2015-evaluated by gynecology  5. zoster rash at June 2015  6. Asthma  7. family history of pancreas cancer 8. Nausea following the pancreaticoduodenectomy procedure. Improved. 9. Anemia. Improved.   Disposition: Ms. Claus appears stable. Plan to proceed with gemcitabine today as scheduled. We again reviewed potential toxicities associated with gemcitabine including myelosuppression, nausea, rash, fever, pneumonitis, allergic reaction. She will return for the next gemcitabine on 01/04/2015. We will see her in follow-up prior to treatment on 01/11/2015. She will contact the office in the interim with any problems.    Ned Card ANP/GNP-BC   12/27/2014  2:56 PM

## 2014-12-28 ENCOUNTER — Telehealth: Payer: Self-pay | Admitting: Nurse Practitioner

## 2014-12-28 ENCOUNTER — Other Ambulatory Visit: Payer: Self-pay | Admitting: Nurse Practitioner

## 2014-12-28 ENCOUNTER — Telehealth: Payer: Self-pay | Admitting: *Deleted

## 2014-12-28 DIAGNOSIS — C250 Malignant neoplasm of head of pancreas: Secondary | ICD-10-CM

## 2014-12-28 LAB — CANCER ANTIGEN 19-9: CA 19 9: 318.9 U/mL — AB (ref <35.0)

## 2014-12-28 NOTE — Telephone Encounter (Signed)
I notified Sonya Larson the CA-19-9 tumor marker was better. We will repeat when she returns on 01/11/2015.

## 2014-12-28 NOTE — Telephone Encounter (Signed)
Pt reports feeling well; had some slight nausea this AM but took her antiemetic and reports feeling fine. No issues or concerns at this time. Knows to call us with any symptoms or concerns.

## 2014-12-31 ENCOUNTER — Other Ambulatory Visit: Payer: Self-pay | Admitting: Oncology

## 2015-01-04 ENCOUNTER — Other Ambulatory Visit (HOSPITAL_BASED_OUTPATIENT_CLINIC_OR_DEPARTMENT_OTHER): Payer: Medicare Other

## 2015-01-04 ENCOUNTER — Ambulatory Visit (HOSPITAL_BASED_OUTPATIENT_CLINIC_OR_DEPARTMENT_OTHER): Payer: Medicare Other

## 2015-01-04 ENCOUNTER — Ambulatory Visit: Payer: Medicare Other

## 2015-01-04 DIAGNOSIS — C250 Malignant neoplasm of head of pancreas: Secondary | ICD-10-CM

## 2015-01-04 DIAGNOSIS — C257 Malignant neoplasm of other parts of pancreas: Secondary | ICD-10-CM

## 2015-01-04 DIAGNOSIS — Z452 Encounter for adjustment and management of vascular access device: Secondary | ICD-10-CM

## 2015-01-04 DIAGNOSIS — Z5111 Encounter for antineoplastic chemotherapy: Secondary | ICD-10-CM

## 2015-01-04 LAB — CBC WITH DIFFERENTIAL/PLATELET
BASO%: 0.2 % (ref 0.0–2.0)
BASOS ABS: 0 10*3/uL (ref 0.0–0.1)
EOS%: 2.4 % (ref 0.0–7.0)
Eosinophils Absolute: 0.1 10*3/uL (ref 0.0–0.5)
HCT: 29.7 % — ABNORMAL LOW (ref 34.8–46.6)
HGB: 9.3 g/dL — ABNORMAL LOW (ref 11.6–15.9)
LYMPH#: 0.4 10*3/uL — AB (ref 0.9–3.3)
LYMPH%: 9.3 % — ABNORMAL LOW (ref 14.0–49.7)
MCH: 27.4 pg (ref 25.1–34.0)
MCHC: 31.3 g/dL — ABNORMAL LOW (ref 31.5–36.0)
MCV: 87.6 fL (ref 79.5–101.0)
MONO#: 0.3 10*3/uL (ref 0.1–0.9)
MONO%: 5.9 % (ref 0.0–14.0)
NEUT%: 82.2 % — AB (ref 38.4–76.8)
NEUTROS ABS: 3.5 10*3/uL (ref 1.5–6.5)
Platelets: 205 10*3/uL (ref 145–400)
RBC: 3.39 10*6/uL — ABNORMAL LOW (ref 3.70–5.45)
RDW: 14.4 % (ref 11.2–14.5)
WBC: 4.2 10*3/uL (ref 3.9–10.3)

## 2015-01-04 LAB — COMPREHENSIVE METABOLIC PANEL (CC13)
ALT: 37 U/L (ref 0–55)
AST: 34 U/L (ref 5–34)
Albumin: 2.7 g/dL — ABNORMAL LOW (ref 3.5–5.0)
Alkaline Phosphatase: 198 U/L — ABNORMAL HIGH (ref 40–150)
Anion Gap: 10 mEq/L (ref 3–11)
BUN: 19.7 mg/dL (ref 7.0–26.0)
CO2: 22 mEq/L (ref 22–29)
CREATININE: 0.7 mg/dL (ref 0.6–1.1)
Calcium: 9 mg/dL (ref 8.4–10.4)
Chloride: 110 mEq/L — ABNORMAL HIGH (ref 98–109)
EGFR: 89 mL/min/{1.73_m2} — ABNORMAL LOW (ref >90)
GLUCOSE: 113 mg/dL (ref 70–140)
POTASSIUM: 4.4 meq/L (ref 3.5–5.1)
Sodium: 142 mEq/L (ref 136–145)
Total Bilirubin: 0.25 mg/dL (ref 0.20–1.20)
Total Protein: 6.5 g/dL (ref 6.4–8.3)

## 2015-01-04 MED ORDER — SODIUM CHLORIDE 0.9 % IJ SOLN
10.0000 mL | INTRAMUSCULAR | Status: DC | PRN
  Administered 2015-01-04 (×2): 10 mL via INTRAVENOUS
  Filled 2015-01-04: qty 10

## 2015-01-04 MED ORDER — SODIUM CHLORIDE 0.9 % IJ SOLN
10.0000 mL | INTRAMUSCULAR | Status: DC | PRN
  Administered 2015-01-04: 10 mL
  Filled 2015-01-04: qty 10

## 2015-01-04 MED ORDER — PROCHLORPERAZINE MALEATE 10 MG PO TABS
ORAL_TABLET | ORAL | Status: AC
  Filled 2015-01-04: qty 1

## 2015-01-04 MED ORDER — SODIUM CHLORIDE 0.9 % IV SOLN
800.0000 mg/m2 | Freq: Once | INTRAVENOUS | Status: AC
  Administered 2015-01-04: 1482 mg via INTRAVENOUS
  Filled 2015-01-04: qty 38.98

## 2015-01-04 MED ORDER — HEPARIN SOD (PORK) LOCK FLUSH 100 UNIT/ML IV SOLN
500.0000 [IU] | Freq: Once | INTRAVENOUS | Status: AC | PRN
  Administered 2015-01-04: 500 [IU]
  Filled 2015-01-04: qty 5

## 2015-01-04 MED ORDER — SODIUM CHLORIDE 0.9 % IV SOLN
Freq: Once | INTRAVENOUS | Status: AC
  Administered 2015-01-04: 13:00:00 via INTRAVENOUS

## 2015-01-04 MED ORDER — PROCHLORPERAZINE MALEATE 10 MG PO TABS
10.0000 mg | ORAL_TABLET | Freq: Once | ORAL | Status: AC
  Administered 2015-01-04: 10 mg via ORAL

## 2015-01-04 NOTE — Patient Instructions (Signed)
[facility] Discharge Instructions for Patients Receiving Chemotherapy  Today you received the following chemotherapy agents: Gemzar.  To help prevent nausea and vomiting after your treatment, we encourage you to take your nausea medication: zofran, Promethazine, Reglan as directed.   If you develop nausea and vomiting that is not controlled by your nausea medication, call the clinic.   BELOW ARE SYMPTOMS THAT SHOULD BE REPORTED IMMEDIATELY:  *FEVER GREATER THAN 100.5 F  *CHILLS WITH OR WITHOUT FEVER  NAUSEA AND VOMITING THAT IS NOT CONTROLLED WITH YOUR NAUSEA MEDICATION  *UNUSUAL SHORTNESS OF BREATH  *UNUSUAL BRUISING OR BLEEDING  TENDERNESS IN MOUTH AND THROAT WITH OR WITHOUT PRESENCE OF ULCERS  *URINARY PROBLEMS  *BOWEL PROBLEMS  UNUSUAL RASH Items with * indicate a potential emergency and should be followed up as soon as possible.  Feel free to call the clinic you have any questions or concerns. The clinic phone number is [phone].

## 2015-01-04 NOTE — Patient Instructions (Signed)
PICC Home Guide A peripherally inserted central catheter (PICC) is a long, thin, flexible tube that is inserted into a vein in the upper arm. It is a form of intravenous (IV) access. It is considered to be a "central" line because the tip of the PICC ends in a large vein in your chest. This large vein is called the superior vena cava (SVC). The PICC tip ends in the SVC because there is a lot of blood flow in the SVC. This allows medicines and IV fluids to be quickly distributed throughout the body. The PICC is inserted using a sterile technique by a specially trained nurse or physician. After the PICC is inserted, a chest X-ray exam is done to be sure it is in the correct place.  A PICC may be placed for different reasons, such as:  To give medicines and liquid nutrition that can only be given through a central line. Examples are:  Certain antibiotic treatments.  Chemotherapy.  Total parenteral nutrition (TPN).  To take frequent blood samples.  To give IV fluids and blood products.  If there is difficulty placing a peripheral intravenous (PIV) catheter. If taken care of properly, a PICC can remain in place for several months. A PICC can also allow a person to go home from the hospital early. Medicine and PICC care can be managed at home by a family member or home health care team. WHAT PROBLEMS CAN HAPPEN WHEN I HAVE A PICC? Problems with a PICC can occasionally occur. These may include the following:  A blood clot (thrombus) forming in or at the tip of the PICC. This can cause the PICC to become clogged. A clot-dissolving medicine called tissue plasminogen activator (tPA) can be given through the PICC to help break up the clot.  Inflammation of the vein (phlebitis) in which the PICC is placed. Signs of inflammation may include redness, pain at the insertion site, red streaks, or being able to feel a "cord" in the vein where the PICC is located.  Infection in the PICC or at the insertion  site. Signs of infection may include fever, chills, redness, swelling, or pus drainage from the PICC insertion site.  PICC movement (malposition). The PICC tip may move from its original position due to excessive physical activity, forceful coughing, sneezing, or vomiting.  A break or cut in the PICC. It is important to not use scissors near the PICC.  Nerve or tendon irritation or injury during PICC insertion. WHAT SHOULD I KEEP IN MIND ABOUT ACTIVITIES WHEN I HAVE A PICC?  You may bend your arm and move it freely. If your PICC is near or at the bend of your elbow, avoid activity with repeated motion at the elbow.  Rest at home for the remainder of the day following PICC line insertion.  Avoid lifting heavy objects as instructed by your health care provider.  Avoid using a crutch with the arm on the same side as your PICC. You may need to use a walker. WHAT SHOULD I KNOW ABOUT MY PICC DRESSING?  Keep your PICC bandage (dressing) clean and dry to prevent infection.  Ask your health care provider when you may shower. Ask your health care provider to teach you how to wrap the PICC when you do take a shower.  Change the PICC dressing as instructed by your health care provider.  Change your PICC dressing if it becomes loose or wet. WHAT SHOULD I KNOW ABOUT PICC CARE?  Check the PICC insertion site   daily for leakage, redness, swelling, or pain.  Do not take a bath, swim, or use hot tubs when you have a PICC. Cover PICC line with clear plastic wrap and tape to keep it dry while showering.  Flush the PICC as directed by your health care provider. Let your health care provider know right away if the PICC is difficult to flush or does not flush. Do not use force to flush the PICC.  Do not use a syringe that is less than 10 mL to flush the PICC.  Never pull or tug on the PICC.  Avoid blood pressure checks on the arm with the PICC.  Keep your PICC identification card with you at all  times.  Do not take the PICC out yourself. Only a trained clinical professional should remove the PICC. SEEK IMMEDIATE MEDICAL CARE IF:  Your PICC is accidentally pulled all the way out. If this happens, cover the insertion site with a bandage or gauze dressing. Do not throw the PICC away. Your health care provider will need to inspect it.  Your PICC was tugged or pulled and has partially come out. Do not  push the PICC back in.  There is any type of drainage, redness, or swelling where the PICC enters the skin.  You cannot flush the PICC, it is difficult to flush, or the PICC leaks around the insertion site when it is flushed.  You hear a "flushing" sound when the PICC is flushed.  You have pain, discomfort, or numbness in your arm, shoulder, or jaw on the same side as the PICC.  You feel your heart "racing" or skipping beats.  You notice a hole or tear in the PICC.  You develop chills or a fever. MAKE SURE YOU:   Understand these instructions.  Will watch your condition.  Will get help right away if you are not doing well or get worse. Document Released: 05/24/2003 Document Revised: 04/03/2014 Document Reviewed: 07/25/2013 Exit[employer] Patient Information 2015 [employer]. This information is not intended to replace advice given to you by your health care provider. Make sure you discuss any questions you have with your health care provider.  

## 2015-01-07 ENCOUNTER — Other Ambulatory Visit: Payer: Self-pay | Admitting: Oncology

## 2015-01-11 ENCOUNTER — Ambulatory Visit (HOSPITAL_BASED_OUTPATIENT_CLINIC_OR_DEPARTMENT_OTHER): Payer: Medicare Other | Admitting: Oncology

## 2015-01-11 ENCOUNTER — Ambulatory Visit: Payer: Medicare Other

## 2015-01-11 ENCOUNTER — Ambulatory Visit (HOSPITAL_BASED_OUTPATIENT_CLINIC_OR_DEPARTMENT_OTHER): Payer: Medicare Other

## 2015-01-11 ENCOUNTER — Other Ambulatory Visit (HOSPITAL_BASED_OUTPATIENT_CLINIC_OR_DEPARTMENT_OTHER): Payer: Medicare Other

## 2015-01-11 ENCOUNTER — Telehealth: Payer: Self-pay | Admitting: Oncology

## 2015-01-11 VITALS — BP 136/85 | HR 89 | Temp 98.5°F | Resp 18 | Ht 63.0 in | Wt 171.3 lb

## 2015-01-11 DIAGNOSIS — C257 Malignant neoplasm of other parts of pancreas: Secondary | ICD-10-CM

## 2015-01-11 DIAGNOSIS — R748 Abnormal levels of other serum enzymes: Secondary | ICD-10-CM

## 2015-01-11 DIAGNOSIS — Z808 Family history of malignant neoplasm of other organs or systems: Secondary | ICD-10-CM

## 2015-01-11 DIAGNOSIS — D649 Anemia, unspecified: Secondary | ICD-10-CM

## 2015-01-11 DIAGNOSIS — I10 Essential (primary) hypertension: Secondary | ICD-10-CM

## 2015-01-11 DIAGNOSIS — E785 Hyperlipidemia, unspecified: Secondary | ICD-10-CM

## 2015-01-11 DIAGNOSIS — C250 Malignant neoplasm of head of pancreas: Secondary | ICD-10-CM

## 2015-01-11 DIAGNOSIS — Z5111 Encounter for antineoplastic chemotherapy: Secondary | ICD-10-CM

## 2015-01-11 DIAGNOSIS — Z452 Encounter for adjustment and management of vascular access device: Secondary | ICD-10-CM

## 2015-01-11 LAB — CBC WITH DIFFERENTIAL/PLATELET
BASO%: 0.4 % (ref 0.0–2.0)
Basophils Absolute: 0 10*3/uL (ref 0.0–0.1)
EOS%: 0.4 % (ref 0.0–7.0)
Eosinophils Absolute: 0 10*3/uL (ref 0.0–0.5)
HCT: 29.3 % — ABNORMAL LOW (ref 34.8–46.6)
HEMOGLOBIN: 9.4 g/dL — AB (ref 11.6–15.9)
LYMPH%: 12.9 % — ABNORMAL LOW (ref 14.0–49.7)
MCH: 27.6 pg (ref 25.1–34.0)
MCHC: 32.1 g/dL (ref 31.5–36.0)
MCV: 86.2 fL (ref 79.5–101.0)
MONO#: 0.3 10*3/uL (ref 0.1–0.9)
MONO%: 9.6 % (ref 0.0–14.0)
NEUT#: 2.1 10*3/uL (ref 1.5–6.5)
NEUT%: 76.7 % (ref 38.4–76.8)
PLATELETS: 168 10*3/uL (ref 145–400)
RBC: 3.4 10*6/uL — AB (ref 3.70–5.45)
RDW: 15.3 % — ABNORMAL HIGH (ref 11.2–14.5)
WBC: 2.7 10*3/uL — ABNORMAL LOW (ref 3.9–10.3)
lymph#: 0.4 10*3/uL — ABNORMAL LOW (ref 0.9–3.3)

## 2015-01-11 LAB — COMPREHENSIVE METABOLIC PANEL (CC13)
ALBUMIN: 2.7 g/dL — AB (ref 3.5–5.0)
ALT: 103 U/L — ABNORMAL HIGH (ref 0–55)
AST: 76 U/L — AB (ref 5–34)
Alkaline Phosphatase: 287 U/L — ABNORMAL HIGH (ref 40–150)
Anion Gap: 10 mEq/L (ref 3–11)
BUN: 17.9 mg/dL (ref 7.0–26.0)
CALCIUM: 8.8 mg/dL (ref 8.4–10.4)
CHLORIDE: 107 meq/L (ref 98–109)
CO2: 24 mEq/L (ref 22–29)
Creatinine: 0.7 mg/dL (ref 0.6–1.1)
EGFR: >90 mL/min/{1.73_m2} (ref >90)
GLUCOSE: 121 mg/dL (ref 70–140)
POTASSIUM: 4.3 meq/L (ref 3.5–5.1)
Sodium: 141 mEq/L (ref 136–145)
Total Bilirubin: 0.48 mg/dL (ref 0.20–1.20)
Total Protein: 6.7 g/dL (ref 6.4–8.3)

## 2015-01-11 MED ORDER — SODIUM CHLORIDE 0.9 % IJ SOLN
10.0000 mL | INTRAMUSCULAR | Status: DC | PRN
  Administered 2015-01-11: 10 mL
  Filled 2015-01-11: qty 10

## 2015-01-11 MED ORDER — HEPARIN SOD (PORK) LOCK FLUSH 100 UNIT/ML IV SOLN
500.0000 [IU] | Freq: Once | INTRAVENOUS | Status: AC | PRN
  Administered 2015-01-11: 500 [IU]
  Filled 2015-01-11: qty 5

## 2015-01-11 MED ORDER — SODIUM CHLORIDE 0.9 % IV SOLN
Freq: Once | INTRAVENOUS | Status: AC
  Administered 2015-01-11: 13:00:00 via INTRAVENOUS

## 2015-01-11 MED ORDER — PROCHLORPERAZINE MALEATE 10 MG PO TABS
10.0000 mg | ORAL_TABLET | Freq: Once | ORAL | Status: AC
  Administered 2015-01-11: 10 mg via ORAL

## 2015-01-11 MED ORDER — HEPARIN SOD (PORK) LOCK FLUSH 100 UNIT/ML IV SOLN
500.0000 [IU] | Freq: Once | INTRAVENOUS | Status: AC
  Administered 2015-01-11: 250 [IU] via INTRAVENOUS
  Filled 2015-01-11: qty 5

## 2015-01-11 MED ORDER — SODIUM CHLORIDE 0.9 % IV SOLN
800.0000 mg/m2 | Freq: Once | INTRAVENOUS | Status: AC
  Administered 2015-01-11: 1482 mg via INTRAVENOUS
  Filled 2015-01-11: qty 38.98

## 2015-01-11 MED ORDER — PROCHLORPERAZINE MALEATE 10 MG PO TABS
ORAL_TABLET | ORAL | Status: AC
  Filled 2015-01-11: qty 1

## 2015-01-11 MED ORDER — SODIUM CHLORIDE 0.9 % IJ SOLN
10.0000 mL | INTRAMUSCULAR | Status: DC | PRN
  Administered 2015-01-11: 10 mL via INTRAVENOUS
  Filled 2015-01-11: qty 10

## 2015-01-11 NOTE — Patient Instructions (Signed)
PICC Home Guide A peripherally inserted central catheter (PICC) is a long, thin, flexible tube that is inserted into a vein in the upper arm. It is a form of intravenous (IV) access. It is considered to be a "central" line because the tip of the PICC ends in a large vein in your chest. This large vein is called the superior vena cava (SVC). The PICC tip ends in the SVC because there is a lot of blood flow in the SVC. This allows medicines and IV fluids to be quickly distributed throughout the body. The PICC is inserted using a sterile technique by a specially trained nurse or physician. After the PICC is inserted, a chest X-ray exam is done to be sure it is in the correct place.  A PICC may be placed for different reasons, such as:  To give medicines and liquid nutrition that can only be given through a central line. Examples are:  Certain antibiotic treatments.  Chemotherapy.  Total parenteral nutrition (TPN).  To take frequent blood samples.  To give IV fluids and blood products.  If there is difficulty placing a peripheral intravenous (PIV) catheter. If taken care of properly, a PICC can remain in place for several months. A PICC can also allow a person to go home from the hospital early. Medicine and PICC care can be managed at home by a family member or home health care team. WHAT PROBLEMS CAN HAPPEN WHEN I HAVE A PICC? Problems with a PICC can occasionally occur. These may include the following:  A blood clot (thrombus) forming in or at the tip of the PICC. This can cause the PICC to become clogged. A clot-dissolving medicine called tissue plasminogen activator (tPA) can be given through the PICC to help break up the clot.  Inflammation of the vein (phlebitis) in which the PICC is placed. Signs of inflammation may include redness, pain at the insertion site, red streaks, or being able to feel a "cord" in the vein where the PICC is located.  Infection in the PICC or at the insertion  site. Signs of infection may include fever, chills, redness, swelling, or pus drainage from the PICC insertion site.  PICC movement (malposition). The PICC tip may move from its original position due to excessive physical activity, forceful coughing, sneezing, or vomiting.  A break or cut in the PICC. It is important to not use scissors near the PICC.  Nerve or tendon irritation or injury during PICC insertion. WHAT SHOULD I KEEP IN MIND ABOUT ACTIVITIES WHEN I HAVE A PICC?  You may bend your arm and move it freely. If your PICC is near or at the bend of your elbow, avoid activity with repeated motion at the elbow.  Rest at home for the remainder of the day following PICC line insertion.  Avoid lifting heavy objects as instructed by your health care provider.  Avoid using a crutch with the arm on the same side as your PICC. You may need to use a walker. WHAT SHOULD I KNOW ABOUT MY PICC DRESSING?  Keep your PICC bandage (dressing) clean and dry to prevent infection.  Ask your health care provider when you may shower. Ask your health care provider to teach you how to wrap the PICC when you do take a shower.  Change the PICC dressing as instructed by your health care provider.  Change your PICC dressing if it becomes loose or wet. WHAT SHOULD I KNOW ABOUT PICC CARE?  Check the PICC insertion site   daily for leakage, redness, swelling, or pain.  Do not take a bath, swim, or use hot tubs when you have a PICC. Cover PICC line with clear plastic wrap and tape to keep it dry while showering.  Flush the PICC as directed by your health care provider. Let your health care provider know right away if the PICC is difficult to flush or does not flush. Do not use force to flush the PICC.  Do not use a syringe that is less than 10 mL to flush the PICC.  Never pull or tug on the PICC.  Avoid blood pressure checks on the arm with the PICC.  Keep your PICC identification card with you at all  times.  Do not take the PICC out yourself. Only a trained clinical professional should remove the PICC. SEEK IMMEDIATE MEDICAL CARE IF:  Your PICC is accidentally pulled all the way out. If this happens, cover the insertion site with a bandage or gauze dressing. Do not throw the PICC away. Your health care provider will need to inspect it.  Your PICC was tugged or pulled and has partially come out. Do not  push the PICC back in.  There is any type of drainage, redness, or swelling where the PICC enters the skin.  You cannot flush the PICC, it is difficult to flush, or the PICC leaks around the insertion site when it is flushed.  You hear a "flushing" sound when the PICC is flushed.  You have pain, discomfort, or numbness in your arm, shoulder, or jaw on the same side as the PICC.  You feel your heart "racing" or skipping beats.  You notice a hole or tear in the PICC.  You develop chills or a fever. MAKE SURE YOU:   Understand these instructions.  Will watch your condition.  Will get help right away if you are not doing well or get worse. Document Released: 05/24/2003 Document Revised: 04/03/2014 Document Reviewed: 07/25/2013 Exit[employer] Patient Information 2015 [employer]. This information is not intended to replace advice given to you by your health care provider. Make sure you discuss any questions you have with your health care provider.  

## 2015-01-11 NOTE — Telephone Encounter (Signed)
Gave avs & calendar for February/March.  °

## 2015-01-11 NOTE — Progress Notes (Signed)
  [facility] OFFICE PROGRESS NOTE   Diagnosis: Pancreas cancer  INTERVAL HISTORY:   Sonya Larson returns as scheduled. She has completed 2 treatments with gemcitabine. She had a rash over the cheeks after the last treatment. She continues "25% "TNA cycled at night. No diarrhea. She is no longer taking pancreas enzyme replacement. Her oral intake remains limited.  Objective:  Vital signs in last 24 hours:  Blood pressure 136/85, pulse 89, temperature 98.5 F (36.9 C), temperature source Oral, resp. rate 18, height 5\' 3"  (1.6 m), weight 171 lb 4.8 oz (77.701 kg), last menstrual period 10/31/2005, SpO2 99 %.    HEENT: No thrush or ulcers Resp: Lungs clear bilaterally Cardio: Regular rate and rhythm GI: No hepatomegaly, nontender, no mass Vascular: No leg edema    Portacath/PICC-without erythema  Lab Results:  Lab Results  Component Value Date   WBC 2.7* 01/11/2015   HGB 9.4* 01/11/2015   HCT 29.3* 01/11/2015   MCV 86.2 01/11/2015   PLT 168 01/11/2015   NEUTROABS 2.1 01/11/2015   Creatinine 0.7, alkaline phosphatase 87, albumin 2.7, AST 76, ALT 103, bilirubin 0.48  CA 19-9 on 12/27/2014-318.9 Medications: I have reviewed the patient's current medications.  Assessment/Plan: 1. Adenocarcinoma the pancreas, uncinate mass, EUS April 2015 revealed a uT3,uN1 lesion with an FNA biopsy confirming adenocarcinoma  MRI abdomen 07/04/2014 confirmed a pancreas uncinate 8 mass, 11 mm portacaval lymph node, no vascular involvement, and no evidence of distant metastatic disease   PET scan 07/20/2012 with a hypermetabolic uncinate process mass and hypermetabolic periportal lymph node   Markedly elevated CA 19-9   Negative diagnostic laparoscopy 07/24/2014   Initiation of Xeloda/radiation 08/10/2014.completed 09/21/2014.  CT 10/03/2014 confirmed a decrease in the pancreas mass, stable portacaval lymph node, and apparent involvement of the superior mesenteric  artery  Pancreaticoduodenectomy on 11/02/2014 confirmed a poorly differentiated adenocarcinoma, ypT2,ypN0  Initiation of adjuvant gemcitabine 12/27/2014 2. Hypertension 3. Hyperlipidemia  4. vaginal spotting summer 2015-evaluated by gynecology  5. zoster rash at June 2015  6. Asthma  7. family history of pancreas cancer 8. Nausea following the pancreaticoduodenectomy procedure. Improved. 9. Anemia.  Disposition:  Sonya Larson appears to be tolerating the gemcitabine well. The elevated liver enzymes are most likely related to TNA. She reports a mild rash over the face following the most recent treatment with gemcitabine. This may have been related to gemcitabine or another etiology. She will advance her diet as tolerated. The TNA will be discontinued when okay with Sonya Larson.   The CA 19-9 was markedly elevated prior to surgery. The CA 19-9 should continue to fall if there is no remaining tumor. We checked a CA 19-9 today.  She will complete a third week of gemcitabine today. She will return to begin cycle 2 in 2 weeks. She will be scheduled for an office visit in 3 weeks.  Sonya Coder, MD  01/11/2015  12:01 PM

## 2015-01-11 NOTE — Patient Instructions (Signed)
[facility] Discharge Instructions for Patients Receiving Chemotherapy  Today you received the following chemotherapy agents gemzar  To help prevent nausea and vomiting after your treatment, we encourage you to take your nausea medication as directed.  If you develop nausea and vomiting that is not controlled by your nausea medication, call the clinic.   BELOW ARE SYMPTOMS THAT SHOULD BE REPORTED IMMEDIATELY:  *FEVER GREATER THAN 100.5 F  *CHILLS WITH OR WITHOUT FEVER  NAUSEA AND VOMITING THAT IS NOT CONTROLLED WITH YOUR NAUSEA MEDICATION  *UNUSUAL SHORTNESS OF BREATH  *UNUSUAL BRUISING OR BLEEDING  TENDERNESS IN MOUTH AND THROAT WITH OR WITHOUT PRESENCE OF ULCERS  *URINARY PROBLEMS  *BOWEL PROBLEMS  UNUSUAL RASH Items with * indicate a potential emergency and should be followed up as soon as possible.  Feel free to call the clinic you have any questions or concerns. The clinic phone number is [phone].  

## 2015-01-11 NOTE — Progress Notes (Signed)
Ok to treat today with elevated AST/ALT Per Dr Benay Spice.

## 2015-01-12 ENCOUNTER — Telehealth: Payer: Self-pay | Admitting: *Deleted

## 2015-01-12 LAB — CANCER ANTIGEN 19-9: CA 19-9: 269.5 U/mL — ABNORMAL HIGH (ref <35.0)

## 2015-01-12 NOTE — Telephone Encounter (Signed)
Called pt with CA19.9 results. Better, per Dr. Benay Spice. Follow up as scheduled. Pt voiced understanding.

## 2015-01-12 NOTE — Telephone Encounter (Signed)
-----   Message from Ladell Pier, MD sent at 01/12/2015  1:43 PM EST ----- Please call patient, tumor marker is better, f/u as scheduled

## 2015-01-18 ENCOUNTER — Other Ambulatory Visit: Payer: Self-pay | Admitting: Family Medicine

## 2015-01-18 NOTE — Telephone Encounter (Signed)
Refill with 2 additional refills. 

## 2015-01-18 NOTE — Telephone Encounter (Signed)
Last visit 06/19/14 Last refill 11/21/14 #30 0 refill

## 2015-01-25 ENCOUNTER — Ambulatory Visit: Payer: Medicare Other

## 2015-01-25 ENCOUNTER — Other Ambulatory Visit: Payer: Self-pay | Admitting: Oncology

## 2015-01-25 ENCOUNTER — Ambulatory Visit (HOSPITAL_BASED_OUTPATIENT_CLINIC_OR_DEPARTMENT_OTHER): Payer: Medicare Other

## 2015-01-25 ENCOUNTER — Other Ambulatory Visit (HOSPITAL_BASED_OUTPATIENT_CLINIC_OR_DEPARTMENT_OTHER): Payer: Medicare Other

## 2015-01-25 DIAGNOSIS — C257 Malignant neoplasm of other parts of pancreas: Secondary | ICD-10-CM

## 2015-01-25 DIAGNOSIS — Z452 Encounter for adjustment and management of vascular access device: Secondary | ICD-10-CM

## 2015-01-25 DIAGNOSIS — C250 Malignant neoplasm of head of pancreas: Secondary | ICD-10-CM

## 2015-01-25 DIAGNOSIS — Z5111 Encounter for antineoplastic chemotherapy: Secondary | ICD-10-CM

## 2015-01-25 LAB — COMPREHENSIVE METABOLIC PANEL (CC13)
ALBUMIN: 2.8 g/dL — AB (ref 3.5–5.0)
ALT: 52 U/L (ref 0–55)
AST: 48 U/L — ABNORMAL HIGH (ref 5–34)
Alkaline Phosphatase: 298 U/L — ABNORMAL HIGH (ref 40–150)
Anion Gap: 10 mEq/L (ref 3–11)
BUN: 20.9 mg/dL (ref 7.0–26.0)
CALCIUM: 9.2 mg/dL (ref 8.4–10.4)
CHLORIDE: 109 meq/L (ref 98–109)
CO2: 23 meq/L (ref 22–29)
Creatinine: 0.7 mg/dL (ref 0.6–1.1)
EGFR: 88 mL/min/{1.73_m2} — AB (ref >90)
GLUCOSE: 126 mg/dL (ref 70–140)
POTASSIUM: 4.3 meq/L (ref 3.5–5.1)
SODIUM: 142 meq/L (ref 136–145)
Total Bilirubin: 0.32 mg/dL (ref 0.20–1.20)
Total Protein: 6.7 g/dL (ref 6.4–8.3)

## 2015-01-25 LAB — CBC WITH DIFFERENTIAL/PLATELET
BASO%: 0.9 % (ref 0.0–2.0)
BASOS ABS: 0.1 10*3/uL (ref 0.0–0.1)
EOS%: 1.5 % (ref 0.0–7.0)
Eosinophils Absolute: 0.1 10*3/uL (ref 0.0–0.5)
HEMATOCRIT: 30.6 % — AB (ref 34.8–46.6)
HEMOGLOBIN: 9.6 g/dL — AB (ref 11.6–15.9)
LYMPH#: 0.6 10*3/uL — AB (ref 0.9–3.3)
LYMPH%: 10.9 % — AB (ref 14.0–49.7)
MCH: 27.7 pg (ref 25.1–34.0)
MCHC: 31.4 g/dL — ABNORMAL LOW (ref 31.5–36.0)
MCV: 88.2 fL (ref 79.5–101.0)
MONO#: 0.5 10*3/uL (ref 0.1–0.9)
MONO%: 8.7 % (ref 0.0–14.0)
NEUT#: 4.2 10*3/uL (ref 1.5–6.5)
NEUT%: 78 % — ABNORMAL HIGH (ref 38.4–76.8)
PLATELETS: 460 10*3/uL — AB (ref 145–400)
RBC: 3.47 10*6/uL — ABNORMAL LOW (ref 3.70–5.45)
RDW: 18.7 % — ABNORMAL HIGH (ref 11.2–14.5)
WBC: 5.4 10*3/uL (ref 3.9–10.3)
nRBC: 0 % (ref 0–0)

## 2015-01-25 MED ORDER — SODIUM CHLORIDE 0.9 % IJ SOLN
10.0000 mL | INTRAMUSCULAR | Status: DC | PRN
  Administered 2015-01-25: 10 mL
  Filled 2015-01-25: qty 10

## 2015-01-25 MED ORDER — SODIUM CHLORIDE 0.9 % IV SOLN
Freq: Once | INTRAVENOUS | Status: AC
  Administered 2015-01-25: 13:00:00 via INTRAVENOUS

## 2015-01-25 MED ORDER — PROCHLORPERAZINE MALEATE 10 MG PO TABS
10.0000 mg | ORAL_TABLET | Freq: Once | ORAL | Status: AC
  Administered 2015-01-25: 10 mg via ORAL

## 2015-01-25 MED ORDER — SODIUM CHLORIDE 0.9 % IJ SOLN
10.0000 mL | INTRAMUSCULAR | Status: DC | PRN
  Administered 2015-01-25: 10 mL via INTRAVENOUS
  Filled 2015-01-25: qty 10

## 2015-01-25 MED ORDER — HEPARIN SOD (PORK) LOCK FLUSH 100 UNIT/ML IV SOLN
500.0000 [IU] | Freq: Once | INTRAVENOUS | Status: AC
  Administered 2015-01-25: 250 [IU] via INTRAVENOUS
  Filled 2015-01-25: qty 5

## 2015-01-25 MED ORDER — PROCHLORPERAZINE MALEATE 10 MG PO TABS
ORAL_TABLET | ORAL | Status: AC
  Filled 2015-01-25: qty 1

## 2015-01-25 MED ORDER — SODIUM CHLORIDE 0.9 % IV SOLN
800.0000 mg/m2 | Freq: Once | INTRAVENOUS | Status: AC
  Administered 2015-01-25: 1482 mg via INTRAVENOUS
  Filled 2015-01-25: qty 38.98

## 2015-01-25 MED ORDER — HEPARIN SOD (PORK) LOCK FLUSH 100 UNIT/ML IV SOLN
500.0000 [IU] | Freq: Once | INTRAVENOUS | Status: AC | PRN
  Administered 2015-01-25: 500 [IU]
  Filled 2015-01-25: qty 5

## 2015-01-25 NOTE — Patient Instructions (Signed)
[facility] Discharge Instructions for Patients Receiving Chemotherapy  Today you received the following chemotherapy agents Gemzar  To help prevent nausea and vomiting after your treatment, we encourage you to take your nausea medication as directed/prescrbied   If you develop nausea and vomiting that is not controlled by your nausea medication, call the clinic.   BELOW ARE SYMPTOMS THAT SHOULD BE REPORTED IMMEDIATELY:  *FEVER GREATER THAN 100.5 F  *CHILLS WITH OR WITHOUT FEVER  NAUSEA AND VOMITING THAT IS NOT CONTROLLED WITH YOUR NAUSEA MEDICATION  *UNUSUAL SHORTNESS OF BREATH  *UNUSUAL BRUISING OR BLEEDING  TENDERNESS IN MOUTH AND THROAT WITH OR WITHOUT PRESENCE OF ULCERS  *URINARY PROBLEMS  *BOWEL PROBLEMS  UNUSUAL RASH Items with * indicate a potential emergency and should be followed up as soon as possible.  Feel free to call the clinic you have any questions or concerns. The clinic phone number is [phone].

## 2015-01-25 NOTE — Patient Instructions (Signed)
PICC Home Guide A peripherally inserted central catheter (PICC) is a long, thin, flexible tube that is inserted into a vein in the upper arm. It is a form of intravenous (IV) access. It is considered to be a "central" line because the tip of the PICC ends in a large vein in your chest. This large vein is called the superior vena cava (SVC). The PICC tip ends in the SVC because there is a lot of blood flow in the SVC. This allows medicines and IV fluids to be quickly distributed throughout the body. The PICC is inserted using a sterile technique by a specially trained nurse or physician. After the PICC is inserted, a chest X-ray exam is done to be sure it is in the correct place.  A PICC may be placed for different reasons, such as:  To give medicines and liquid nutrition that can only be given through a central line. Examples are:  Certain antibiotic treatments.  Chemotherapy.  Total parenteral nutrition (TPN).  To take frequent blood samples.  To give IV fluids and blood products.  If there is difficulty placing a peripheral intravenous (PIV) catheter. If taken care of properly, a PICC can remain in place for several months. A PICC can also allow a person to go home from the hospital early. Medicine and PICC care can be managed at home by a family member or home health care team. WHAT PROBLEMS CAN HAPPEN WHEN I HAVE A PICC? Problems with a PICC can occasionally occur. These may include the following:  A blood clot (thrombus) forming in or at the tip of the PICC. This can cause the PICC to become clogged. A clot-dissolving medicine called tissue plasminogen activator (tPA) can be given through the PICC to help break up the clot.  Inflammation of the vein (phlebitis) in which the PICC is placed. Signs of inflammation may include redness, pain at the insertion site, red streaks, or being able to feel a "cord" in the vein where the PICC is located.  Infection in the PICC or at the insertion  site. Signs of infection may include fever, chills, redness, swelling, or pus drainage from the PICC insertion site.  PICC movement (malposition). The PICC tip may move from its original position due to excessive physical activity, forceful coughing, sneezing, or vomiting.  A break or cut in the PICC. It is important to not use scissors near the PICC.  Nerve or tendon irritation or injury during PICC insertion. WHAT SHOULD I KEEP IN MIND ABOUT ACTIVITIES WHEN I HAVE A PICC?  You may bend your arm and move it freely. If your PICC is near or at the bend of your elbow, avoid activity with repeated motion at the elbow.  Rest at home for the remainder of the day following PICC line insertion.  Avoid lifting heavy objects as instructed by your health care provider.  Avoid using a crutch with the arm on the same side as your PICC. You may need to use a walker. WHAT SHOULD I KNOW ABOUT MY PICC DRESSING?  Keep your PICC bandage (dressing) clean and dry to prevent infection.  Ask your health care provider when you may shower. Ask your health care provider to teach you how to wrap the PICC when you do take a shower.  Change the PICC dressing as instructed by your health care provider.  Change your PICC dressing if it becomes loose or wet. WHAT SHOULD I KNOW ABOUT PICC CARE?  Check the PICC insertion site   daily for leakage, redness, swelling, or pain.  Do not take a bath, swim, or use hot tubs when you have a PICC. Cover PICC line with clear plastic wrap and tape to keep it dry while showering.  Flush the PICC as directed by your health care provider. Let your health care provider know right away if the PICC is difficult to flush or does not flush. Do not use force to flush the PICC.  Do not use a syringe that is less than 10 mL to flush the PICC.  Never pull or tug on the PICC.  Avoid blood pressure checks on the arm with the PICC.  Keep your PICC identification card with you at all  times.  Do not take the PICC out yourself. Only a trained clinical professional should remove the PICC. SEEK IMMEDIATE MEDICAL CARE IF:  Your PICC is accidentally pulled all the way out. If this happens, cover the insertion site with a bandage or gauze dressing. Do not throw the PICC away. Your health care provider will need to inspect it.  Your PICC was tugged or pulled and has partially come out. Do not  push the PICC back in.  There is any type of drainage, redness, or swelling where the PICC enters the skin.  You cannot flush the PICC, it is difficult to flush, or the PICC leaks around the insertion site when it is flushed.  You hear a "flushing" sound when the PICC is flushed.  You have pain, discomfort, or numbness in your arm, shoulder, or jaw on the same side as the PICC.  You feel your heart "racing" or skipping beats.  You notice a hole or tear in the PICC.  You develop chills or a fever. MAKE SURE YOU:   Understand these instructions.  Will watch your condition.  Will get help right away if you are not doing well or get worse. Document Released: 05/24/2003 Document Revised: 04/03/2014 Document Reviewed: 07/25/2013 Exit[employer] Patient Information 2015 [employer]. This information is not intended to replace advice given to you by your health care provider. Make sure you discuss any questions you have with your health care provider.  

## 2015-01-28 ENCOUNTER — Other Ambulatory Visit: Payer: Self-pay | Admitting: Oncology

## 2015-02-01 ENCOUNTER — Ambulatory Visit (HOSPITAL_BASED_OUTPATIENT_CLINIC_OR_DEPARTMENT_OTHER): Payer: Medicare Other

## 2015-02-01 ENCOUNTER — Other Ambulatory Visit (HOSPITAL_BASED_OUTPATIENT_CLINIC_OR_DEPARTMENT_OTHER): Payer: Medicare Other

## 2015-02-01 ENCOUNTER — Ambulatory Visit: Payer: Medicare Other

## 2015-02-01 DIAGNOSIS — C250 Malignant neoplasm of head of pancreas: Secondary | ICD-10-CM | POA: Diagnosis not present

## 2015-02-01 DIAGNOSIS — Z5111 Encounter for antineoplastic chemotherapy: Secondary | ICD-10-CM | POA: Diagnosis not present

## 2015-02-01 DIAGNOSIS — Z95828 Presence of other vascular implants and grafts: Secondary | ICD-10-CM

## 2015-02-01 LAB — CBC WITH DIFFERENTIAL/PLATELET
BASO%: 1.1 % (ref 0.0–2.0)
BASOS ABS: 0 10*3/uL (ref 0.0–0.1)
EOS ABS: 0 10*3/uL (ref 0.0–0.5)
EOS%: 0.4 % (ref 0.0–7.0)
HCT: 29.4 % — ABNORMAL LOW (ref 34.8–46.6)
HEMOGLOBIN: 9.3 g/dL — AB (ref 11.6–15.9)
LYMPH%: 15 % (ref 14.0–49.7)
MCH: 27.6 pg (ref 25.1–34.0)
MCHC: 31.6 g/dL (ref 31.5–36.0)
MCV: 87.2 fL (ref 79.5–101.0)
MONO#: 0.3 10*3/uL (ref 0.1–0.9)
MONO%: 11.3 % (ref 0.0–14.0)
NEUT#: 2 10*3/uL (ref 1.5–6.5)
NEUT%: 72.2 % (ref 38.4–76.8)
Platelets: 287 10*3/uL (ref 145–400)
RBC: 3.37 10*6/uL — ABNORMAL LOW (ref 3.70–5.45)
RDW: 18 % — AB (ref 11.2–14.5)
WBC: 2.7 10*3/uL — AB (ref 3.9–10.3)
lymph#: 0.4 10*3/uL — ABNORMAL LOW (ref 0.9–3.3)

## 2015-02-01 LAB — COMPREHENSIVE METABOLIC PANEL (CC13)
ALT: 38 U/L (ref 0–55)
ANION GAP: 10 meq/L (ref 3–11)
AST: 40 U/L — ABNORMAL HIGH (ref 5–34)
Albumin: 2.7 g/dL — ABNORMAL LOW (ref 3.5–5.0)
Alkaline Phosphatase: 273 U/L — ABNORMAL HIGH (ref 40–150)
BILIRUBIN TOTAL: 0.33 mg/dL (ref 0.20–1.20)
BUN: 20.5 mg/dL (ref 7.0–26.0)
CO2: 22 meq/L (ref 22–29)
CREATININE: 0.7 mg/dL (ref 0.6–1.1)
Calcium: 8.8 mg/dL (ref 8.4–10.4)
Chloride: 109 mEq/L (ref 98–109)
GLUCOSE: 106 mg/dL (ref 70–140)
Potassium: 4.1 mEq/L (ref 3.5–5.1)
Sodium: 141 mEq/L (ref 136–145)
Total Protein: 6.6 g/dL (ref 6.4–8.3)

## 2015-02-01 MED ORDER — HEPARIN SOD (PORK) LOCK FLUSH 100 UNIT/ML IV SOLN
500.0000 [IU] | Freq: Once | INTRAVENOUS | Status: AC | PRN
  Administered 2015-02-01: 500 [IU]
  Filled 2015-02-01: qty 5

## 2015-02-01 MED ORDER — SODIUM CHLORIDE 0.9 % IJ SOLN
10.0000 mL | INTRAMUSCULAR | Status: DC | PRN
  Administered 2015-02-01: 10 mL via INTRAVENOUS
  Filled 2015-02-01: qty 10

## 2015-02-01 MED ORDER — SODIUM CHLORIDE 0.9 % IV SOLN
Freq: Once | INTRAVENOUS | Status: AC
  Administered 2015-02-01: 13:00:00 via INTRAVENOUS

## 2015-02-01 MED ORDER — SODIUM CHLORIDE 0.9 % IV SOLN
800.0000 mg/m2 | Freq: Once | INTRAVENOUS | Status: AC
  Administered 2015-02-01: 1482 mg via INTRAVENOUS
  Filled 2015-02-01: qty 38.98

## 2015-02-01 MED ORDER — PROCHLORPERAZINE MALEATE 10 MG PO TABS
ORAL_TABLET | ORAL | Status: AC
  Filled 2015-02-01: qty 1

## 2015-02-01 MED ORDER — SODIUM CHLORIDE 0.9 % IJ SOLN
10.0000 mL | INTRAMUSCULAR | Status: DC | PRN
  Administered 2015-02-01: 10 mL
  Filled 2015-02-01: qty 10

## 2015-02-01 MED ORDER — PROCHLORPERAZINE MALEATE 10 MG PO TABS
10.0000 mg | ORAL_TABLET | Freq: Once | ORAL | Status: AC
  Administered 2015-02-01: 10 mg via ORAL

## 2015-02-01 NOTE — Patient Instructions (Signed)
[facility] Discharge Instructions for Patients Receiving Chemotherapy  Today you received the following chemotherapy agents gemzar  To help prevent nausea and vomiting after your treatment, we encourage you to take your nausea medication as directed.  If you develop nausea and vomiting that is not controlled by your nausea medication, call the clinic.   BELOW ARE SYMPTOMS THAT SHOULD BE REPORTED IMMEDIATELY:  *FEVER GREATER THAN 100.5 F  *CHILLS WITH OR WITHOUT FEVER  NAUSEA AND VOMITING THAT IS NOT CONTROLLED WITH YOUR NAUSEA MEDICATION  *UNUSUAL SHORTNESS OF BREATH  *UNUSUAL BRUISING OR BLEEDING  TENDERNESS IN MOUTH AND THROAT WITH OR WITHOUT PRESENCE OF ULCERS  *URINARY PROBLEMS  *BOWEL PROBLEMS  UNUSUAL RASH Items with * indicate a potential emergency and should be followed up as soon as possible.  Feel free to call the clinic you have any questions or concerns. The clinic phone number is [phone].  

## 2015-02-01 NOTE — Patient Instructions (Signed)
PICC Home Guide A peripherally inserted central catheter (PICC) is a long, thin, flexible tube that is inserted into a vein in the upper arm. It is a form of intravenous (IV) access. It is considered to be a "central" line because the tip of the PICC ends in a large vein in your chest. This large vein is called the superior vena cava (SVC). The PICC tip ends in the SVC because there is a lot of blood flow in the SVC. This allows medicines and IV fluids to be quickly distributed throughout the body. The PICC is inserted using a sterile technique by a specially trained nurse or physician. After the PICC is inserted, a chest X-ray exam is done to be sure it is in the correct place.  A PICC may be placed for different reasons, such as:  To give medicines and liquid nutrition that can only be given through a central line. Examples are:  Certain antibiotic treatments.  Chemotherapy.  Total parenteral nutrition (TPN).  To take frequent blood samples.  To give IV fluids and blood products.  If there is difficulty placing a peripheral intravenous (PIV) catheter. If taken care of properly, a PICC can remain in place for several months. A PICC can also allow a person to go home from the hospital early. Medicine and PICC care can be managed at home by a family member or home health care team. WHAT PROBLEMS CAN HAPPEN WHEN I HAVE A PICC? Problems with a PICC can occasionally occur. These may include the following:  A blood clot (thrombus) forming in or at the tip of the PICC. This can cause the PICC to become clogged. A clot-dissolving medicine called tissue plasminogen activator (tPA) can be given through the PICC to help break up the clot.  Inflammation of the vein (phlebitis) in which the PICC is placed. Signs of inflammation may include redness, pain at the insertion site, red streaks, or being able to feel a "cord" in the vein where the PICC is located.  Infection in the PICC or at the insertion  site. Signs of infection may include fever, chills, redness, swelling, or pus drainage from the PICC insertion site.  PICC movement (malposition). The PICC tip may move from its original position due to excessive physical activity, forceful coughing, sneezing, or vomiting.  A break or cut in the PICC. It is important to not use scissors near the PICC.  Nerve or tendon irritation or injury during PICC insertion. WHAT SHOULD I KEEP IN MIND ABOUT ACTIVITIES WHEN I HAVE A PICC?  You may bend your arm and move it freely. If your PICC is near or at the bend of your elbow, avoid activity with repeated motion at the elbow.  Rest at home for the remainder of the day following PICC line insertion.  Avoid lifting heavy objects as instructed by your health care provider.  Avoid using a crutch with the arm on the same side as your PICC. You may need to use a walker. WHAT SHOULD I KNOW ABOUT MY PICC DRESSING?  Keep your PICC bandage (dressing) clean and dry to prevent infection.  Ask your health care provider when you may shower. Ask your health care provider to teach you how to wrap the PICC when you do take a shower.  Change the PICC dressing as instructed by your health care provider.  Change your PICC dressing if it becomes loose or wet. WHAT SHOULD I KNOW ABOUT PICC CARE?  Check the PICC insertion site   daily for leakage, redness, swelling, or pain.  Do not take a bath, swim, or use hot tubs when you have a PICC. Cover PICC line with clear plastic wrap and tape to keep it dry while showering.  Flush the PICC as directed by your health care provider. Let your health care provider know right away if the PICC is difficult to flush or does not flush. Do not use force to flush the PICC.  Do not use a syringe that is less than 10 mL to flush the PICC.  Never pull or tug on the PICC.  Avoid blood pressure checks on the arm with the PICC.  Keep your PICC identification card with you at all  times.  Do not take the PICC out yourself. Only a trained clinical professional should remove the PICC. SEEK IMMEDIATE MEDICAL CARE IF:  Your PICC is accidentally pulled all the way out. If this happens, cover the insertion site with a bandage or gauze dressing. Do not throw the PICC away. Your health care provider will need to inspect it.  Your PICC was tugged or pulled and has partially come out. Do not  push the PICC back in.  There is any type of drainage, redness, or swelling where the PICC enters the skin.  You cannot flush the PICC, it is difficult to flush, or the PICC leaks around the insertion site when it is flushed.  You hear a "flushing" sound when the PICC is flushed.  You have pain, discomfort, or numbness in your arm, shoulder, or jaw on the same side as the PICC.  You feel your heart "racing" or skipping beats.  You notice a hole or tear in the PICC.  You develop chills or a fever. MAKE SURE YOU:   Understand these instructions.  Will watch your condition.  Will get help right away if you are not doing well or get worse. Document Released: 05/24/2003 Document Revised: 04/03/2014 Document Reviewed: 07/25/2013 Exit[employer] Patient Information 2015 [employer]. This information is not intended to replace advice given to you by your health care provider. Make sure you discuss any questions you have with your health care provider.  

## 2015-02-02 LAB — CANCER ANTIGEN 19-9: CA 19-9: 455.2 U/mL — ABNORMAL HIGH (ref <35.0)

## 2015-02-07 ENCOUNTER — Other Ambulatory Visit: Payer: Self-pay | Admitting: *Deleted

## 2015-02-07 DIAGNOSIS — C250 Malignant neoplasm of head of pancreas: Secondary | ICD-10-CM

## 2015-02-08 ENCOUNTER — Inpatient Hospital Stay (HOSPITAL_COMMUNITY)
Admission: EM | Admit: 2015-02-08 | Discharge: 2015-02-13 | DRG: 871 | Disposition: A | Discharge status: Home / Self Care | Payer: Medicare Other | Attending: Internal Medicine | Admitting: Internal Medicine

## 2015-02-08 ENCOUNTER — Other Ambulatory Visit (HOSPITAL_BASED_OUTPATIENT_CLINIC_OR_DEPARTMENT_OTHER): Payer: Medicare Other

## 2015-02-08 ENCOUNTER — Encounter (HOSPITAL_COMMUNITY): Payer: Self-pay

## 2015-02-08 ENCOUNTER — Telehealth: Payer: Self-pay | Admitting: *Deleted

## 2015-02-08 ENCOUNTER — Telehealth: Payer: Self-pay | Admitting: Nurse Practitioner

## 2015-02-08 ENCOUNTER — Other Ambulatory Visit: Payer: Self-pay | Admitting: Oncology

## 2015-02-08 ENCOUNTER — Ambulatory Visit: Payer: Medicare Other

## 2015-02-08 ENCOUNTER — Ambulatory Visit (HOSPITAL_BASED_OUTPATIENT_CLINIC_OR_DEPARTMENT_OTHER): Payer: Medicare Other

## 2015-02-08 ENCOUNTER — Ambulatory Visit (HOSPITAL_BASED_OUTPATIENT_CLINIC_OR_DEPARTMENT_OTHER): Payer: Medicare Other | Admitting: Nurse Practitioner

## 2015-02-08 VITALS — BP 152/89 | HR 85 | Temp 98.4°F | Wt 170.2 lb

## 2015-02-08 DIAGNOSIS — C257 Malignant neoplasm of other parts of pancreas: Secondary | ICD-10-CM

## 2015-02-08 DIAGNOSIS — T451X5A Adverse effect of antineoplastic and immunosuppressive drugs, initial encounter: Secondary | ICD-10-CM | POA: Diagnosis present

## 2015-02-08 DIAGNOSIS — F419 Anxiety disorder, unspecified: Secondary | ICD-10-CM | POA: Diagnosis present

## 2015-02-08 DIAGNOSIS — Y95 Nosocomial condition: Secondary | ICD-10-CM | POA: Diagnosis present

## 2015-02-08 DIAGNOSIS — R110 Nausea: Secondary | ICD-10-CM | POA: Diagnosis not present

## 2015-02-08 DIAGNOSIS — Z8659 Personal history of other mental and behavioral disorders: Secondary | ICD-10-CM

## 2015-02-08 DIAGNOSIS — I10 Essential (primary) hypertension: Secondary | ICD-10-CM | POA: Diagnosis present

## 2015-02-08 DIAGNOSIS — C250 Malignant neoplasm of head of pancreas: Secondary | ICD-10-CM | POA: Diagnosis present

## 2015-02-08 DIAGNOSIS — R509 Fever, unspecified: Secondary | ICD-10-CM | POA: Diagnosis not present

## 2015-02-08 DIAGNOSIS — J189 Pneumonia, unspecified organism: Secondary | ICD-10-CM

## 2015-02-08 DIAGNOSIS — Z808 Family history of malignant neoplasm of other organs or systems: Secondary | ICD-10-CM

## 2015-02-08 DIAGNOSIS — D649 Anemia, unspecified: Secondary | ICD-10-CM | POA: Diagnosis not present

## 2015-02-08 DIAGNOSIS — Z6830 Body mass index (BMI) 30.0-30.9, adult: Secondary | ICD-10-CM

## 2015-02-08 DIAGNOSIS — Z86010 Personal history of colonic polyps: Secondary | ICD-10-CM

## 2015-02-08 DIAGNOSIS — D61810 Antineoplastic chemotherapy induced pancytopenia: Secondary | ICD-10-CM

## 2015-02-08 DIAGNOSIS — Z5111 Encounter for antineoplastic chemotherapy: Secondary | ICD-10-CM | POA: Diagnosis not present

## 2015-02-08 DIAGNOSIS — D696 Thrombocytopenia, unspecified: Secondary | ICD-10-CM | POA: Diagnosis present

## 2015-02-08 DIAGNOSIS — Z452 Encounter for adjustment and management of vascular access device: Secondary | ICD-10-CM

## 2015-02-08 DIAGNOSIS — Z9221 Personal history of antineoplastic chemotherapy: Secondary | ICD-10-CM

## 2015-02-08 DIAGNOSIS — F329 Major depressive disorder, single episode, unspecified: Secondary | ICD-10-CM | POA: Diagnosis present

## 2015-02-08 DIAGNOSIS — E785 Hyperlipidemia, unspecified: Secondary | ICD-10-CM | POA: Diagnosis present

## 2015-02-08 DIAGNOSIS — E43 Unspecified severe protein-calorie malnutrition: Secondary | ICD-10-CM | POA: Diagnosis present

## 2015-02-08 DIAGNOSIS — K219 Gastro-esophageal reflux disease without esophagitis: Secondary | ICD-10-CM | POA: Diagnosis present

## 2015-02-08 DIAGNOSIS — A419 Sepsis, unspecified organism: Principal | ICD-10-CM | POA: Diagnosis present

## 2015-02-08 DIAGNOSIS — C259 Malignant neoplasm of pancreas, unspecified: Secondary | ICD-10-CM | POA: Diagnosis present

## 2015-02-08 DIAGNOSIS — D638 Anemia in other chronic diseases classified elsewhere: Secondary | ICD-10-CM | POA: Diagnosis present

## 2015-02-08 DIAGNOSIS — M1990 Unspecified osteoarthritis, unspecified site: Secondary | ICD-10-CM | POA: Diagnosis present

## 2015-02-08 DIAGNOSIS — J90 Pleural effusion, not elsewhere classified: Secondary | ICD-10-CM

## 2015-02-08 DIAGNOSIS — J45909 Unspecified asthma, uncomplicated: Secondary | ICD-10-CM | POA: Diagnosis present

## 2015-02-08 LAB — CBC WITH DIFFERENTIAL/PLATELET
BASO%: 0.4 % (ref 0.0–2.0)
Basophils Absolute: 0 10*3/uL (ref 0.0–0.1)
EOS%: 0.4 % (ref 0.0–7.0)
Eosinophils Absolute: 0 10*3/uL (ref 0.0–0.5)
HEMATOCRIT: 28.8 % — AB (ref 34.8–46.6)
HGB: 9.3 g/dL — ABNORMAL LOW (ref 11.6–15.9)
LYMPH#: 0.4 10*3/uL — AB (ref 0.9–3.3)
LYMPH%: 14.7 % (ref 14.0–49.7)
MCH: 28.2 pg (ref 25.1–34.0)
MCHC: 32.3 g/dL (ref 31.5–36.0)
MCV: 87.3 fL (ref 79.5–101.0)
MONO#: 0.3 10*3/uL (ref 0.1–0.9)
MONO%: 12.2 % (ref 0.0–14.0)
NEUT#: 1.7 10*3/uL (ref 1.5–6.5)
NEUT%: 72.3 % (ref 38.4–76.8)
Platelets: 121 10*3/uL — ABNORMAL LOW (ref 145–400)
RBC: 3.3 10*6/uL — ABNORMAL LOW (ref 3.70–5.45)
RDW: 18.6 % — ABNORMAL HIGH (ref 11.2–14.5)
WBC: 2.4 10*3/uL — ABNORMAL LOW (ref 3.9–10.3)

## 2015-02-08 LAB — COMPREHENSIVE METABOLIC PANEL (CC13)
ALK PHOS: 279 U/L — AB (ref 40–150)
ALT: 48 U/L (ref 0–55)
AST: 47 U/L — AB (ref 5–34)
Albumin: 2.8 g/dL — ABNORMAL LOW (ref 3.5–5.0)
Anion Gap: 11 mEq/L (ref 3–11)
BILIRUBIN TOTAL: 0.45 mg/dL (ref 0.20–1.20)
BUN: 19.1 mg/dL (ref 7.0–26.0)
CO2: 22 meq/L (ref 22–29)
CREATININE: 0.7 mg/dL (ref 0.6–1.1)
Calcium: 8.8 mg/dL (ref 8.4–10.4)
Chloride: 108 mEq/L (ref 98–109)
EGFR: >90 mL/min/{1.73_m2} (ref >90)
Glucose: 113 mg/dl (ref 70–140)
Potassium: 3.9 mEq/L (ref 3.5–5.1)
Sodium: 140 mEq/L (ref 136–145)
Total Protein: 6.7 g/dL (ref 6.4–8.3)

## 2015-02-08 LAB — TECHNOLOGIST REVIEW

## 2015-02-08 MED ORDER — SODIUM CHLORIDE 0.9 % IJ SOLN
10.0000 mL | INTRAMUSCULAR | Status: DC | PRN
  Administered 2015-02-08: 10 mL via INTRAVENOUS
  Filled 2015-02-08: qty 10

## 2015-02-08 MED ORDER — HEPARIN SOD (PORK) LOCK FLUSH 100 UNIT/ML IV SOLN
500.0000 [IU] | Freq: Once | INTRAVENOUS | Status: AC | PRN
  Administered 2015-02-08: 500 [IU]
  Filled 2015-02-08: qty 5

## 2015-02-08 MED ORDER — SODIUM CHLORIDE 0.9 % IV SOLN
Freq: Once | INTRAVENOUS | Status: AC
  Administered 2015-02-08: 13:00:00 via INTRAVENOUS
  Filled 2015-02-08: qty 4

## 2015-02-08 MED ORDER — HEPARIN SOD (PORK) LOCK FLUSH 100 UNIT/ML IV SOLN
500.0000 [IU] | Freq: Once | INTRAVENOUS | Status: DC
  Filled 2015-02-08: qty 5

## 2015-02-08 MED ORDER — SODIUM CHLORIDE 0.9 % IV SOLN
800.0000 mg/m2 | Freq: Once | INTRAVENOUS | Status: AC
  Administered 2015-02-08: 1482 mg via INTRAVENOUS
  Filled 2015-02-08: qty 38.98

## 2015-02-08 MED ORDER — SODIUM CHLORIDE 0.9 % IV SOLN
Freq: Once | INTRAVENOUS | Status: AC
  Administered 2015-02-08: 13:00:00 via INTRAVENOUS

## 2015-02-08 MED ORDER — SODIUM CHLORIDE 0.9 % IJ SOLN
10.0000 mL | INTRAMUSCULAR | Status: DC | PRN
  Administered 2015-02-08: 10 mL
  Filled 2015-02-08: qty 10

## 2015-02-08 NOTE — Progress Notes (Signed)
  [place] OFFICE PROGRESS NOTE   Diagnosis:  Pancreas cancer  INTERVAL HISTORY:   Ms. Vankleeck returns as scheduled. She has completed 5 treatments with gemcitabine. After each treatment she has noted flushing and a rash over the cheeks. This resolves in about 2 days. No other rash. She notes nausea day 2 lasting for about 4 days. No vomiting. No mouth sores. No diarrhea. She denies fever and cough. No shortness of breath or chest pain. She is beginning to wean the TPN.  Objective:  Vital signs in last 24 hours: Temperature 98.4, heart rate 85, blood pressure 152/89, weight 170.2 pounds  HEENT: No thrush or ulcers.  Resp: Lungs clear bilaterally. Cardio: Regular rate and rhythm. GI: Abdomen soft and nontender. No hepatomegaly. Vascular: No leg edema. Calves soft and nontender.  Skin: Faint acne like rash over the cheeks.   Lab Results:  Lab Results  Component Value Date   WBC 2.4* 02/08/2015   HGB 9.3* 02/08/2015   HCT 28.8* 02/08/2015   MCV 87.3 02/08/2015   PLT 121* 02/08/2015   NEUTROABS 1.7 02/08/2015   02/01/2015 CA-19-9 455.2 (269.5 on 01/11/2015)  Imaging:  No results found.  Medications: I have reviewed the patient's current medications.  Assessment/Plan: 1. Adenocarcinoma the pancreas, uncinate mass, EUS April 2015 revealed a uT3,uN1 lesion with an FNA biopsy confirming adenocarcinoma  MRI abdomen 07/04/2014 confirmed a pancreas uncinate 8 mass, 11 mm portacaval lymph node, no vascular involvement, and no evidence of distant metastatic disease   PET scan 07/20/2012 with a hypermetabolic uncinate process mass and hypermetabolic periportal lymph node   Markedly elevated CA 19-9   Negative diagnostic laparoscopy 07/24/2014   Initiation of Xeloda/radiation 08/10/2014.completed 09/21/2014.  CT 10/03/2014 confirmed a decrease in the pancreas mass, stable portacaval lymph node, and apparent involvement of the superior mesenteric  artery  Pancreaticoduodenectomy on 11/02/2014 confirmed a poorly differentiated adenocarcinoma, ypT2,ypN0  Initiation of adjuvant gemcitabine 12/27/2014 2. Hypertension 3. Hyperlipidemia  4. Vaginal spotting summer 2015-evaluated by gynecology  5. Zoster rash at June 2015  6. Asthma  7. amily history of pancreas cancer 8. Nausea following the pancreaticoduodenectomy procedure. Improved. 9. Anemia.   Disposition: Sonya Larson appears stable. She has completed 5 treatments with gemcitabine. Plan to proceed with #6 today as scheduled. We reviewed that the platelet count is mildly decreased. She understands to contact the office with any bleeding. She will be off of treatment next week. She will return to begin the next cycle of 3 weekly gemcitabine treatments on 02/21/2015. We will see her in follow-up that day.   We discussed that the CA-19-9 was higher on 02/01/2015. We will repeat the CA-19-9 on 02/21/2015.  She will contact the office prior to her next visit as outlined above or with any other problems.  Plan reviewed with Dr. Benay Spice.   Ned Card ANP/GNP-BC   02/08/2015  12:42 PM

## 2015-02-08 NOTE — Telephone Encounter (Signed)
Per staff message and POF I have scheduled appts. Advised scheduler of appts and first available given. JMW  

## 2015-02-08 NOTE — Patient Instructions (Addendum)
[facility] Discharge Instructions for Patients Receiving Chemotherapy  Today you received the following chemotherapy agents Gemzar  To help prevent nausea and vomiting after your treatment, we encourage you to take your nausea medication as directed by Dr. Benay Spice. Zofran 8mg  every 8 hr as needed, Reglan 10 mg three times daily as needed, Phenergan 12.5mg  (1/2 tab or 1 tab) every 6 hr as needed.  May use Ativan 0.5mg  every 6-8 hrs for anticipatory nausea as needed. Pls do not drive or operate motor or sharp objects while on ativan. This med may cause drowsiness.    If you develop nausea and vomiting that is not controlled by your nausea medication, call the clinic.   BELOW ARE SYMPTOMS THAT SHOULD BE REPORTED IMMEDIATELY:  *FEVER GREATER THAN 100.5 F  *CHILLS WITH OR WITHOUT FEVER  NAUSEA AND VOMITING THAT IS NOT CONTROLLED WITH YOUR NAUSEA MEDICATION  *UNUSUAL SHORTNESS OF BREATH  *UNUSUAL BRUISING OR BLEEDING  TENDERNESS IN MOUTH AND THROAT WITH OR WITHOUT PRESENCE OF ULCERS  *URINARY PROBLEMS  *BOWEL PROBLEMS  UNUSUAL RASH Items with * indicate a potential emergency and should be followed up as soon as possible.  Feel free to call the clinic you have any questions or concerns. The clinic phone number is [phone].

## 2015-02-08 NOTE — Progress Notes (Signed)
Ok per Ned Card NP to treat pt. With today's lab. WBC 2.4 , Plt. 121.  HL

## 2015-02-08 NOTE — Telephone Encounter (Signed)
Pt confirmed labs/ov per 03/10 POF, gave pt AVS.... KJ, sent msg to add chemo

## 2015-02-08 NOTE — ED Notes (Signed)
Pt had a chemo treatment today and woke up with a 102.9 fever, her Dr told her to come to the ER

## 2015-02-08 NOTE — Patient Instructions (Signed)
PICC Home Guide A peripherally inserted central catheter (PICC) is a long, thin, flexible tube that is inserted into a vein in the upper arm. It is a form of intravenous (IV) access. It is considered to be a "central" line because the tip of the PICC ends in a large vein in your chest. This large vein is called the superior vena cava (SVC). The PICC tip ends in the SVC because there is a lot of blood flow in the SVC. This allows medicines and IV fluids to be quickly distributed throughout the body. The PICC is inserted using a sterile technique by a specially trained nurse or physician. After the PICC is inserted, a chest X-ray exam is done to be sure it is in the correct place.  A PICC may be placed for different reasons, such as:  To give medicines and liquid nutrition that can only be given through a central line. Examples are:  Certain antibiotic treatments.  Chemotherapy.  Total parenteral nutrition (TPN).  To take frequent blood samples.  To give IV fluids and blood products.  If there is difficulty placing a peripheral intravenous (PIV) catheter. If taken care of properly, a PICC can remain in place for several months. A PICC can also allow a person to go home from the hospital early. Medicine and PICC care can be managed at home by a family member or home health care team. WHAT PROBLEMS CAN HAPPEN WHEN I HAVE A PICC? Problems with a PICC can occasionally occur. These may include the following:  A blood clot (thrombus) forming in or at the tip of the PICC. This can cause the PICC to become clogged. A clot-dissolving medicine called tissue plasminogen activator (tPA) can be given through the PICC to help break up the clot.  Inflammation of the vein (phlebitis) in which the PICC is placed. Signs of inflammation may include redness, pain at the insertion site, red streaks, or being able to feel a "cord" in the vein where the PICC is located.  Infection in the PICC or at the insertion  site. Signs of infection may include fever, chills, redness, swelling, or pus drainage from the PICC insertion site.  PICC movement (malposition). The PICC tip may move from its original position due to excessive physical activity, forceful coughing, sneezing, or vomiting.  A break or cut in the PICC. It is important to not use scissors near the PICC.  Nerve or tendon irritation or injury during PICC insertion. WHAT SHOULD I KEEP IN MIND ABOUT ACTIVITIES WHEN I HAVE A PICC?  You may bend your arm and move it freely. If your PICC is near or at the bend of your elbow, avoid activity with repeated motion at the elbow.  Rest at home for the remainder of the day following PICC line insertion.  Avoid lifting heavy objects as instructed by your health care provider.  Avoid using a crutch with the arm on the same side as your PICC. You may need to use a walker. WHAT SHOULD I KNOW ABOUT MY PICC DRESSING?  Keep your PICC bandage (dressing) clean and dry to prevent infection.  Ask your health care provider when you may shower. Ask your health care provider to teach you how to wrap the PICC when you do take a shower.  Change the PICC dressing as instructed by your health care provider.  Change your PICC dressing if it becomes loose or wet. WHAT SHOULD I KNOW ABOUT PICC CARE?  Check the PICC insertion site   daily for leakage, redness, swelling, or pain.  Do not take a bath, swim, or use hot tubs when you have a PICC. Cover PICC line with clear plastic wrap and tape to keep it dry while showering.  Flush the PICC as directed by your health care provider. Let your health care provider know right away if the PICC is difficult to flush or does not flush. Do not use force to flush the PICC.  Do not use a syringe that is less than 10 mL to flush the PICC.  Never pull or tug on the PICC.  Avoid blood pressure checks on the arm with the PICC.  Keep your PICC identification card with you at all  times.  Do not take the PICC out yourself. Only a trained clinical professional should remove the PICC. SEEK IMMEDIATE MEDICAL CARE IF:  Your PICC is accidentally pulled all the way out. If this happens, cover the insertion site with a bandage or gauze dressing. Do not throw the PICC away. Your health care provider will need to inspect it.  Your PICC was tugged or pulled and has partially come out. Do not  push the PICC back in.  There is any type of drainage, redness, or swelling where the PICC enters the skin.  You cannot flush the PICC, it is difficult to flush, or the PICC leaks around the insertion site when it is flushed.  You hear a "flushing" sound when the PICC is flushed.  You have pain, discomfort, or numbness in your arm, shoulder, or jaw on the same side as the PICC.  You feel your heart "racing" or skipping beats.  You notice a hole or tear in the PICC.  You develop chills or a fever. MAKE SURE YOU:   Understand these instructions.  Will watch your condition.  Will get help right away if you are not doing well or get worse. Document Released: 05/24/2003 Document Revised: 04/03/2014 Document Reviewed: 07/25/2013 Exit[employer] Patient Information 2015 [employer]. This information is not intended to replace advice given to you by your health care provider. Make sure you discuss any questions you have with your health care provider.  

## 2015-02-08 NOTE — ED Notes (Signed)
Pt being treated for pancreatic cancer, she has a port a cath and a PICC

## 2015-02-09 ENCOUNTER — Emergency Department (HOSPITAL_COMMUNITY): Payer: Medicare Other

## 2015-02-09 ENCOUNTER — Inpatient Hospital Stay (HOSPITAL_COMMUNITY): Payer: Medicare Other

## 2015-02-09 ENCOUNTER — Other Ambulatory Visit: Payer: Self-pay

## 2015-02-09 DIAGNOSIS — J189 Pneumonia, unspecified organism: Secondary | ICD-10-CM | POA: Diagnosis present

## 2015-02-09 DIAGNOSIS — F419 Anxiety disorder, unspecified: Secondary | ICD-10-CM | POA: Diagnosis present

## 2015-02-09 DIAGNOSIS — I10 Essential (primary) hypertension: Secondary | ICD-10-CM | POA: Diagnosis not present

## 2015-02-09 DIAGNOSIS — D638 Anemia in other chronic diseases classified elsewhere: Secondary | ICD-10-CM | POA: Diagnosis present

## 2015-02-09 DIAGNOSIS — Z9221 Personal history of antineoplastic chemotherapy: Secondary | ICD-10-CM | POA: Diagnosis not present

## 2015-02-09 DIAGNOSIS — J90 Pleural effusion, not elsewhere classified: Secondary | ICD-10-CM | POA: Diagnosis present

## 2015-02-09 DIAGNOSIS — C250 Malignant neoplasm of head of pancreas: Secondary | ICD-10-CM | POA: Diagnosis not present

## 2015-02-09 DIAGNOSIS — C259 Malignant neoplasm of pancreas, unspecified: Secondary | ICD-10-CM | POA: Diagnosis present

## 2015-02-09 DIAGNOSIS — J4520 Mild intermittent asthma, uncomplicated: Secondary | ICD-10-CM

## 2015-02-09 DIAGNOSIS — Z86010 Personal history of colonic polyps: Secondary | ICD-10-CM | POA: Diagnosis not present

## 2015-02-09 DIAGNOSIS — J45909 Unspecified asthma, uncomplicated: Secondary | ICD-10-CM | POA: Diagnosis present

## 2015-02-09 DIAGNOSIS — A419 Sepsis, unspecified organism: Principal | ICD-10-CM

## 2015-02-09 DIAGNOSIS — T451X5A Adverse effect of antineoplastic and immunosuppressive drugs, initial encounter: Secondary | ICD-10-CM | POA: Diagnosis present

## 2015-02-09 DIAGNOSIS — E43 Unspecified severe protein-calorie malnutrition: Secondary | ICD-10-CM | POA: Diagnosis present

## 2015-02-09 DIAGNOSIS — K219 Gastro-esophageal reflux disease without esophagitis: Secondary | ICD-10-CM | POA: Diagnosis present

## 2015-02-09 DIAGNOSIS — D61810 Antineoplastic chemotherapy induced pancytopenia: Secondary | ICD-10-CM | POA: Diagnosis present

## 2015-02-09 DIAGNOSIS — F329 Major depressive disorder, single episode, unspecified: Secondary | ICD-10-CM | POA: Diagnosis present

## 2015-02-09 DIAGNOSIS — IMO0001 Reserved for inherently not codable concepts without codable children: Secondary | ICD-10-CM | POA: Insufficient documentation

## 2015-02-09 DIAGNOSIS — E785 Hyperlipidemia, unspecified: Secondary | ICD-10-CM

## 2015-02-09 DIAGNOSIS — M1990 Unspecified osteoarthritis, unspecified site: Secondary | ICD-10-CM | POA: Diagnosis present

## 2015-02-09 DIAGNOSIS — Z6830 Body mass index (BMI) 30.0-30.9, adult: Secondary | ICD-10-CM | POA: Diagnosis not present

## 2015-02-09 DIAGNOSIS — D696 Thrombocytopenia, unspecified: Secondary | ICD-10-CM | POA: Diagnosis present

## 2015-02-09 DIAGNOSIS — R509 Fever, unspecified: Secondary | ICD-10-CM | POA: Diagnosis present

## 2015-02-09 DIAGNOSIS — Z8659 Personal history of other mental and behavioral disorders: Secondary | ICD-10-CM

## 2015-02-09 DIAGNOSIS — Y95 Nosocomial condition: Secondary | ICD-10-CM | POA: Diagnosis present

## 2015-02-09 LAB — GLUCOSE, CAPILLARY
GLUCOSE-CAPILLARY: 112 mg/dL — AB (ref 70–99)
Glucose-Capillary: 136 mg/dL — ABNORMAL HIGH (ref 70–99)

## 2015-02-09 LAB — URINALYSIS, ROUTINE W REFLEX MICROSCOPIC
BILIRUBIN URINE: NEGATIVE
Glucose, UA: NEGATIVE mg/dL
HGB URINE DIPSTICK: NEGATIVE
Ketones, ur: NEGATIVE mg/dL
Leukocytes, UA: NEGATIVE
Nitrite: NEGATIVE
PH: 5 (ref 5.0–8.0)
Protein, ur: NEGATIVE mg/dL
Specific Gravity, Urine: 1.023 (ref 1.005–1.030)
UROBILINOGEN UA: 1 mg/dL (ref 0.0–1.0)

## 2015-02-09 LAB — COMPREHENSIVE METABOLIC PANEL
ALT: 44 U/L — ABNORMAL HIGH (ref 0–35)
AST: 53 U/L — ABNORMAL HIGH (ref 0–37)
Albumin: 2.8 g/dL — ABNORMAL LOW (ref 3.5–5.2)
Alkaline Phosphatase: 229 U/L — ABNORMAL HIGH (ref 39–117)
Anion gap: 5 (ref 5–15)
BUN: 22 mg/dL (ref 6–23)
CALCIUM: 8.4 mg/dL (ref 8.4–10.5)
CO2: 22 mmol/L (ref 19–32)
CREATININE: 0.69 mg/dL (ref 0.50–1.10)
Chloride: 108 mmol/L (ref 96–112)
GFR calc non Af Amer: 89 mL/min — ABNORMAL LOW (ref >90)
GLUCOSE: 145 mg/dL — AB (ref 70–99)
Potassium: 3.7 mmol/L (ref 3.5–5.1)
Sodium: 135 mmol/L (ref 135–145)
TOTAL PROTEIN: 6.3 g/dL (ref 6.0–8.3)
Total Bilirubin: 0.6 mg/dL (ref 0.3–1.2)

## 2015-02-09 LAB — CBC WITH DIFFERENTIAL/PLATELET
Basophils Absolute: 0 10*3/uL (ref 0.0–0.1)
Basophils Relative: 0 % (ref 0–1)
EOS ABS: 0 10*3/uL (ref 0.0–0.7)
Eosinophils Relative: 0 % (ref 0–5)
HCT: 26.9 % — ABNORMAL LOW (ref 36.0–46.0)
Hemoglobin: 8.5 g/dL — ABNORMAL LOW (ref 12.0–15.0)
LYMPHS ABS: 0.3 10*3/uL — AB (ref 0.7–4.0)
Lymphocytes Relative: 6 % — ABNORMAL LOW (ref 12–46)
MCH: 28.2 pg (ref 26.0–34.0)
MCHC: 31.6 g/dL (ref 30.0–36.0)
MCV: 89.4 fL (ref 78.0–100.0)
MONOS PCT: 8 % (ref 3–12)
Monocytes Absolute: 0.4 10*3/uL (ref 0.1–1.0)
Neutro Abs: 3.7 10*3/uL (ref 1.7–7.7)
Neutrophils Relative %: 86 % — ABNORMAL HIGH (ref 43–77)
Platelets: 138 10*3/uL — ABNORMAL LOW (ref 150–400)
RBC: 3.01 MIL/uL — ABNORMAL LOW (ref 3.87–5.11)
RDW: 19.5 % — ABNORMAL HIGH (ref 11.5–15.5)
WBC: 4.4 10*3/uL (ref 4.0–10.5)

## 2015-02-09 LAB — PROCALCITONIN: Procalcitonin: 0.17 ng/mL

## 2015-02-09 LAB — STREP PNEUMONIAE URINARY ANTIGEN: Strep Pneumo Urinary Antigen: NEGATIVE

## 2015-02-09 LAB — LIPASE, BLOOD: LIPASE: 10 U/L — AB (ref 11–59)

## 2015-02-09 LAB — INFLUENZA PANEL BY PCR (TYPE A & B)
H1N1 flu by pcr: NOT DETECTED
Influenza A By PCR: NEGATIVE
Influenza B By PCR: NEGATIVE

## 2015-02-09 LAB — PROTIME-INR
INR: 1.29 (ref 0.00–1.49)
PROTHROMBIN TIME: 16.2 s — AB (ref 11.6–15.2)

## 2015-02-09 LAB — I-STAT CG4 LACTIC ACID, ED: Lactic Acid, Venous: 1.21 mmol/L (ref 0.5–2.0)

## 2015-02-09 LAB — LACTIC ACID, PLASMA: Lactic Acid, Venous: 0.9 mmol/L (ref 0.5–2.0)

## 2015-02-09 LAB — APTT: aPTT: 36 seconds (ref 24–37)

## 2015-02-09 MED ORDER — SODIUM CHLORIDE 0.9 % IV BOLUS (SEPSIS)
1000.0000 mL | Freq: Once | INTRAVENOUS | Status: AC
Start: 2015-02-09 — End: 2015-02-09
  Administered 2015-02-09: 1000 mL via INTRAVENOUS

## 2015-02-09 MED ORDER — PIPERACILLIN-TAZOBACTAM 3.375 G IVPB 30 MIN: 3.3750 g | Freq: Once | INTRAVENOUS | Status: DC

## 2015-02-09 MED ORDER — ACETAMINOPHEN 325 MG PO TABS
650.0000 mg | ORAL_TABLET | Freq: Once | ORAL | Status: AC
  Administered 2015-02-09: 650 mg via ORAL
  Filled 2015-02-09: qty 2

## 2015-02-09 MED ORDER — ATORVASTATIN CALCIUM 10 MG PO TABS
10.0000 mg | ORAL_TABLET | Freq: Every day | ORAL | Status: DC
  Administered 2015-02-09 – 2015-02-13 (×5): 10 mg via ORAL
  Filled 2015-02-09 (×5): qty 1

## 2015-02-09 MED ORDER — LISINOPRIL 20 MG PO TABS
40.0000 mg | ORAL_TABLET | Freq: Every day | ORAL | Status: DC
  Administered 2015-02-09 – 2015-02-13 (×5): 40 mg via ORAL
  Filled 2015-02-09: qty 2
  Filled 2015-02-09 (×6): qty 1

## 2015-02-09 MED ORDER — LORAZEPAM 0.5 MG PO TABS
0.5000 mg | ORAL_TABLET | Freq: Four times a day (QID) | ORAL | Status: DC | PRN
  Administered 2015-02-10 – 2015-02-11 (×2): 0.5 mg via ORAL
  Filled 2015-02-09 (×2): qty 1

## 2015-02-09 MED ORDER — INSULIN ASPART 100 UNIT/ML ~~LOC~~ SOLN
0.0000 [IU] | SUBCUTANEOUS | Status: DC
  Administered 2015-02-09 – 2015-02-10 (×2): 1 [IU] via SUBCUTANEOUS

## 2015-02-09 MED ORDER — HEPARIN SODIUM (PORCINE) 5000 UNIT/ML IJ SOLN: 5000.0000 [IU] | Freq: Three times a day (TID) | INTRAMUSCULAR | Status: DC

## 2015-02-09 MED ORDER — ONDANSETRON HCL 4 MG/2ML IJ SOLN: 4.0000 mg | Freq: Four times a day (QID) | INTRAMUSCULAR | Status: DC | PRN

## 2015-02-09 MED ORDER — NONFORMULARY OR COMPOUNDED ITEM
1500.0000 mL/d | Status: AC
  Administered 2015-02-09: 1500 mL/d via INTRAVENOUS

## 2015-02-09 MED ORDER — SODIUM CHLORIDE 0.9 % IV SOLN
INTRAVENOUS | Status: DC
  Administered 2015-02-09: 07:00:00 via INTRAVENOUS

## 2015-02-09 MED ORDER — ENSURE COMPLETE PO LIQD: 237.0000 mL | Freq: Two times a day (BID) | ORAL | Status: DC

## 2015-02-09 MED ORDER — METOCLOPRAMIDE HCL 10 MG PO TABS
10.0000 mg | ORAL_TABLET | Freq: Three times a day (TID) | ORAL | Status: DC
  Administered 2015-02-09 – 2015-02-13 (×12): 10 mg via ORAL
  Filled 2015-02-09 (×13): qty 1

## 2015-02-09 MED ORDER — BUDESONIDE-FORMOTEROL FUMARATE 160-4.5 MCG/ACT IN AERO
1.0000 | INHALATION_SPRAY | Freq: Every day | RESPIRATORY_TRACT | Status: DC | PRN
  Filled 2015-02-09: qty 6

## 2015-02-09 MED ORDER — VANCOMYCIN HCL IN DEXTROSE 1-5 GM/200ML-% IV SOLN
1000.0000 mg | Freq: Two times a day (BID) | INTRAVENOUS | Status: DC
  Administered 2015-02-09 – 2015-02-13 (×9): 1000 mg via INTRAVENOUS
  Filled 2015-02-09 (×10): qty 200

## 2015-02-09 MED ORDER — SODIUM CHLORIDE 0.9 % IJ SOLN
10.0000 mL | INTRAMUSCULAR | Status: DC | PRN
  Administered 2015-02-09: 30 mL
  Administered 2015-02-10 – 2015-02-13 (×3): 10 mL
  Filled 2015-02-09 (×3): qty 40

## 2015-02-09 MED ORDER — LIDOCAINE-PRILOCAINE 2.5-2.5 % EX CREA
1.0000 "application " | TOPICAL_CREAM | CUTANEOUS | Status: DC | PRN
  Filled 2015-02-09: qty 5

## 2015-02-09 MED ORDER — ONDANSETRON HCL 4 MG/2ML IJ SOLN
4.0000 mg | Freq: Once | INTRAMUSCULAR | Status: AC
  Administered 2015-02-09: 4 mg via INTRAVENOUS
  Filled 2015-02-09 (×2): qty 2

## 2015-02-09 MED ORDER — DEXTROSE 5 % IV SOLN
1.0000 g | Freq: Three times a day (TID) | INTRAVENOUS | Status: DC
  Administered 2015-02-09 – 2015-02-11 (×7): 1 g via INTRAVENOUS
  Filled 2015-02-09 (×8): qty 1

## 2015-02-09 MED ORDER — IBUPROFEN 200 MG PO TABS
400.0000 mg | ORAL_TABLET | Freq: Four times a day (QID) | ORAL | Status: DC | PRN
  Administered 2015-02-10 – 2015-02-11 (×2): 400 mg via ORAL
  Filled 2015-02-09 (×2): qty 2

## 2015-02-09 MED ORDER — ENOXAPARIN SODIUM 40 MG/0.4ML ~~LOC~~ SOLN
40.0000 mg | SUBCUTANEOUS | Status: DC
  Administered 2015-02-09 – 2015-02-12 (×4): 40 mg via SUBCUTANEOUS
  Filled 2015-02-09 (×4): qty 0.4

## 2015-02-09 MED ORDER — LORATADINE 10 MG PO TABS
10.0000 mg | ORAL_TABLET | Freq: Every day | ORAL | Status: DC
  Filled 2015-02-09 (×4): qty 1

## 2015-02-09 MED ORDER — PIPERACILLIN-TAZOBACTAM 3.375 G IVPB: 3.3750 g | Freq: Three times a day (TID) | INTRAVENOUS | Status: DC

## 2015-02-09 MED ORDER — NONFORMULARY OR COMPOUNDED ITEM
1500.0000 mL/d | Status: DC
  Filled 2015-02-09: qty 1

## 2015-02-09 MED ORDER — CALCIUM CARBONATE ANTACID 500 MG PO CHEW
2.0000 | CHEWABLE_TABLET | Freq: Every day | ORAL | Status: DC | PRN
  Administered 2015-02-11 – 2015-02-12 (×2): 400 mg via ORAL
  Filled 2015-02-09 (×3): qty 1

## 2015-02-09 MED ORDER — SODIUM CHLORIDE 0.9 % IJ SOLN: 10.0000 mL | Freq: Two times a day (BID) | INTRAMUSCULAR | Status: DC

## 2015-02-09 NOTE — Progress Notes (Signed)
INITIAL NUTRITION ASSESSMENT  DOCUMENTATION CODES Per approved criteria  -Not Applicable   INTERVENTION: D/C Ensure  TPN per pharmacy based on calculated energy needs   RD will continue to monitor  NUTRITION DIAGNOSIS: Increased nutrient needs related to chronic illness as evidenced by estimated energy needs.   Goal: Pt to meet >/= 90% of estimated energy needs  Monitor:  PO intake, TPN, weight trends, labs, I/O's  Reason for Assessment: C/s for assessment of nutrition requirements/status  64 y.o. female  Admitting Dx: HCAP (healthcare-associated pneumonia)  ASSESSMENT: 64 y/o female with history of HTN, hyperlipidemia, GERD, asthma and pancreatic cancer post Whipple and radiation currently on chemotherapy. Presented with fever after last dose of chemotherapy. She developed some mild abdominal discomfort but no cough, SOB, N/V.  Labs and medications reviewed. Pt reported no significant wt loss, and she receives nocturnal TPN.  She reported that her weight has been stable. Current PO intake is 0%. She had one episode of emesis this morning after trying Ensure. She confirmed nausea.  Will discontinue Ensure.  She is not interested in any nutrition supplements. Provided saltines for her after assessment. NFPE did not reveal any subcutaneous body fat depletion or muscle mass depletion.  Continue to encourage PO intake, and monitor tolerance of intake. Follow up with pharmacy about TPN recommendations.  Height: Ht Readings from Last 1 Encounters:  02/09/15 5\' 3"  (1.6 m)    Weight: Wt Readings from Last 1 Encounters:  02/09/15 172 lb 9.9 oz (78.3 kg)    Ideal Body Weight: 115 lb (52.7 kg)  % Ideal Body Weight: 149%  Wt Readings from Last 10 Encounters:  02/09/15 172 lb 9.9 oz (78.3 kg)  02/08/15 170 lb 3.2 oz (77.202 kg)  01/11/15 171 lb 4.8 oz (77.701 kg)  12/27/14 170 lb 14.4 oz (77.52 kg)  12/13/14 170 lb (77.111 kg)  11/27/14 172 lb (78.019 kg)  10/30/14 175  lb 8 oz (79.606 kg)  10/04/14 172 lb 14.4 oz (78.427 kg)  10/04/14 173 lb (78.472 kg)  09/20/14 178 lb (80.74 kg)    Usual Body Weight: 170 lb  % Usual Body Weight: 100%  BMI:  Body mass index is 30.59 kg/(m^2).  Estimated Nutritional Needs: Kcal: 1850-2000  Protein: 117-125 grams Fluid: >/= 1.8L daily  Skin: intact  Diet Order: Diet regular  EDUCATION NEEDS: -No education needs identified at this time   Intake/Output Summary (Last 24 hours) at 02/09/15 1348 Last data filed at 02/09/15 1300  Gross per 24 hour  Intake    390 ml  Output      0 ml  Net    390 ml    Last BM: pta  Labs:   Recent Labs Lab 02/08/15 1132 02/09/15 0300  NA 140 135  K 3.9 3.7  CL  --  108  CO2 22 22  BUN 19.1 22  CREATININE 0.7 0.69  CALCIUM 8.8 8.4  GLUCOSE 113 145*    CBG (last 3)  No results for input(s): GLUCAP in the last 72 hours.  Scheduled Meds: . atorvastatin  10 mg Oral Daily  . ceFEPime (MAXIPIME) IV  1 g Intravenous 3 times per day  . feeding supplement (ENSURE COMPLETE)  237 mL Oral BID BM  . lisinopril  40 mg Oral Daily  . loratadine  10 mg Oral Daily  . metoCLOPramide  10 mg Oral TID  . sodium chloride  10-40 mL Intracatheter Q12H  . vancomycin  1,000 mg Intravenous Q12H  Continuous Infusions:   Past Medical History  Diagnosis Date  . Chicken pox   . Depression   . Hypertension   . Hyperlipidemia   . Polyp of colon   . Shingles JUNE 2015    RIGHT  SHOULDER  . Anxiety     since diagnosis  . Allergy   . Pancreatic cancer 07/12/14    Adenocarcinoma. Chemo/ radiation completed early 9'15-Dr. Sherril follows  . Hx of radiation therapy 08/10/14-09/21/14    pancreas 50.4Gy/39fx  . Asthma     exercise induced, seasonal   . GERD (gastroesophageal reflux disease)     uses TUM  PRN, since chemo-   . Arthritis     mild, hips   . Status post PICC central line placement     10/2014    Past Surgical History  Procedure Laterality Date  . Breast  reduction surgery Bilateral   . Colonscopy   LAST DONE SEPT 2014    X 3  . Hysteroscopy  2005    "heavy menopause"  . Eus N/A 07/12/2014    Procedure: ESOPHAGEAL ENDOSCOPIC ULTRASOUND (EUS) RADIAL;  Surgeon: Arta Silence, MD;  Location: [facility];  Service: Endoscopy;  Laterality: N/A;  . Fine needle aspiration N/A 07/12/2014    Procedure: FINE NEEDLE ASPIRATION (FNA) RADIAL;  Surgeon: Arta Silence, MD;  Location: [facility];  Service: Endoscopy;  Laterality: N/A;  . Laparoscopy N/A 07/24/2014    Procedure: LAPAROSCOPY DIAGNOSTIC;  Surgeon: Stark Klein, MD;  Location: [place];  Service: General;  Laterality: N/A;-"no unusual fingings"  . Eus N/A 10/25/2014    Procedure: ESOPHAGEAL ENDOSCOPIC ULTRASOUND (EUS) RADIAL;  Surgeon: Arta Silence, MD;  Location: [facility];  Service: Endoscopy;  Laterality: N/A;  . Fine needle aspiration N/A 10/25/2014    Procedure: FINE NEEDLE ASPIRATION (FNA) LINEAR;  Surgeon: Arta Silence, MD;  Location: [facility];  Service: Endoscopy;  Laterality: N/A;  . Laparoscopy N/A 11/02/2014    Procedure: LAPAROSCOPY DIAGNOSTIC;  Surgeon: Stark Klein, MD;  Location: [place];  Service: General;  Laterality: N/A;  . Whipple procedure N/A 11/02/2014    Procedure: WHIPPLE PROCEDURE;  Surgeon: Stark Klein, MD;  Location: [place];  Service: General;  Laterality: N/A;  . Portacath placement Left 12/13/2014    Procedure: INSERTION PORT-A-CATH;  Surgeon: Stark Klein, MD;  Location: [place];  Service: General;  Laterality: Left;    Wynona Dove, MS Dietetic Intern Pager: [phone]

## 2015-02-09 NOTE — Progress Notes (Signed)
[facility]  Patient Status: Active (receiving services up to time of hospitalization)  [facility] is providing the following services: RN and home TPN  If patient discharges after hours, please call [phone].   Sonya Larson 02/09/2015, 5:23 PM

## 2015-02-09 NOTE — Progress Notes (Signed)
PARENTERAL NUTRITION CONSULT NOTE - INITIAL  Pharmacy Consult for TPN Indication: Intolerance of enteral feeding due to pancreatic cancer.  No Known Allergies  Patient Measurements: Height: 5\' 3"  (160 cm) Weight: 172 lb 9.9 oz (78.3 kg) IBW/kg (Calculated) : 52.4 Adjusted Body Weight: Usual Weight:  Vital Signs: Temp: 99.1 F (37.3 C) (03/11 1332) Temp Source: Oral (03/11 1332) BP: 124/68 mmHg (03/11 1332) Pulse Rate: 87 (03/11 1332) Intake/Output from previous day:   Intake/Output from this shift: Total I/O In: 390 [P.O.:360; I.V.:30] Out: -   Labs:  Recent Labs  02/08/15 1130 02/09/15 0300 02/09/15 0852  WBC 2.4* 4.4  --   HGB 9.3* 8.5*  --   HCT 28.8* 26.9*  --   PLT 121* 138*  --   APTT  --   --  36  INR  --   --  1.29     Recent Labs  02/08/15 1132 02/09/15 0300  NA 140 135  K 3.9 3.7  CL  --  108  CO2 22 22  GLUCOSE 113 145*  BUN 19.1 22  CREATININE 0.7 0.69  CALCIUM 8.8 8.4  PROT 6.7 6.3  ALBUMIN 2.8* 2.8*  AST 47* 53*  ALT 48 44*  ALKPHOS 279* 229*  BILITOT 0.45 0.6   Estimated Creatinine Clearance: 69.5 mL/min (by C-G formula based on Cr of 0.69).   No results for input(s): GLUCAP in the last 72 hours.  Medical History: Past Medical History  Diagnosis Date  . Chicken pox   . Depression   . Hypertension   . Hyperlipidemia   . Polyp of colon   . Shingles JUNE 2015    RIGHT  SHOULDER  . Anxiety     since diagnosis  . Allergy   . Pancreatic cancer 07/12/14    Adenocarcinoma. Chemo/ radiation completed early 9'15-Dr. Sherril follows  . Hx of radiation therapy 08/10/14-09/21/14    pancreas 50.4Gy/93fx  . Asthma     exercise induced, seasonal   . GERD (gastroesophageal reflux disease)     uses TUM  PRN, since chemo-   . Arthritis     mild, hips   . Status post PICC central line placement     10/2014   Insulin Requirements: None here or prior to admission  Current Nutrition: TPN at home  IVF: none  Central access:  PICC TPN start date: PTA  ASSESSMENT                                                                                                          HPI: 63yo F admitted 3/11 with fever. She is getting Gemzar q7d for pancreatic cancer. Received cycle 6 the day before admission. She is on TPN at home and pharmacy is asked to continue while inpatient. The patient asked if we can use the TPN bags already mixed by [facility]. I informed her of our policy not to do this but she insisted. Family will bring it in to be stored in pharmacy. We will use it as long as it is appropriate and  safe for her current situation.    Significant events:   Today, 02/09/2015:   Glucose - at goal <150.  Electrolytes - wnl. CorrCa 9.4.  Renal - SCr wnl  LFTs - AST/ALT slightly elevated.   TGs -  Prealbumin -  NUTRITIONAL GOALS                                                                                             RD recs: pending [facility] 3-in-1 formula: 1565ml delivered over 12 hours provides 1200Kcal and 80g protein.   PLAN                                                                                                                         At 1800 today:  Start [facility] TPN cyclic infusion: 81JS/RP from 6p-7p; 187ml/hr from 7p-5a; 61ml/hr from 5a-6a; off 6a-6p.  TPN to contain standard multivitamins and trace elements.  Add SSI sensitive with CBGs at 8p and 4a.   TPN lab panels on Mondays & Thursdays.  F/u daily.  Romeo Rabon, PharmD, pager [phone]. 02/09/2015,2:35 PM.

## 2015-02-09 NOTE — Progress Notes (Signed)
Patient ID: Sonya Larson, female   DOB: 1951/05/05, 64 y.o.   MRN: [id number] Patient presented to ultrasound department today for left thoracentesis.  Limited ultrasound left posterior chest today reveals only a small effusion. Case was discussed with Dr. Sheran Fava and decision made to postpone thoracentesis at this time. Should patient become more symptomatic we can reevaluate for thoracentesis next week.

## 2015-02-09 NOTE — ED Notes (Signed)
Patient does not want to be stuck again for blood cultures.

## 2015-02-09 NOTE — ED Notes (Signed)
Pt refused 2nd set of blood cultures. MD aware.

## 2015-02-09 NOTE — Evaluation (Signed)
Occupational Therapy Evaluation Patient Details Name: Sonya Larson MRN: [id number] DOB: [birth year] Today's Date: 02/09/2015    History of Present Illness Sonya Larson is a 64 y.o. female with past medical history of hypertension, hyperlipidemia, GERD, asthma, pancreatic cancer, currently on chemotherapy, who presents with fever & was admitted w/  HCAP.   Clinical Impression   Pt is a 64 y/o female admitted as above and was assessed today by OT, she was overall Mod I level for basic ADL's and functional transfers related to ADL's. She plans to d/c home with husband/family assist and denies further acute OT needs at this time. Will sign off, thank you for this referral.    Follow Up Recommendations  No OT follow up    Equipment Recommendations  None recommended by OT    Recommendations for Other Services       Precautions / Restrictions Precautions Precautions: Fall;Other (comment) Precaution Comments: Droplet Restrictions Weight Bearing Restrictions: No      Mobility Bed Mobility Overal bed mobility: Modified Independent                Transfers Overall transfer level: Modified independent               General transfer comment: OT moved IV pole and pt ambulated w/o AD and w/o LOB at Mod I level.    Balance Overall balance assessment: No apparent balance deficits (not formally assessed)                                          ADL Overall ADL's : At baseline;Modified independent                                       General ADL Comments: Pt participated in ADL retraining and assessment today to include LB dressing (donned shoes sitting EOB); bed mobility and functional mobility in room w/o AD and w/o LOB noted, she transferred to regular height toilet, performed toileting tasks followed by standing at sink for grooming all w/o assist. Pt denied need for further OT and states that her husband and family will be able  to assist with any needs after d/c. Will sign off acute OT at this time.     Vision  NT   Perception     Praxis      Pertinent Vitals/Pain Pain Assessment: No/denies pain     Hand Dominance Right   Extremity/Trunk Assessment Upper Extremity Assessment Upper Extremity Assessment: Overall WFL for tasks assessed   Lower Extremity Assessment Lower Extremity Assessment: Defer to PT evaluation       Communication Communication Communication: No difficulties   Cognition Arousal/Alertness: Awake/alert Behavior During Therapy: WFL for tasks assessed/performed Overall Cognitive Status: Within Functional Limits for tasks assessed                     General Comments       Exercises       Shoulder Instructions      Home Living Family/patient expects to be discharged to:: Private residence Living Arrangements: Spouse/significant other Available Help at Discharge: Family Type of Home: House Home Access: Stairs to enter [employer] of Steps: 2-3   Home Layout: One level     Bathroom Shower/Tub: Teacher, early years/pre:  Standard     Home Equipment: None          Prior Functioning/Environment Level of Independence: Needs assistance  Gait / Transfers Assistance Needed: Ambulates w/o AD ADL's / Homemaking Assistance Needed: Pt reports that husband/family assist PRN with homemaking and meal prep. She reports Mod I sponge bathing & occasionaly gets into tub (& doesn't use RUE) secondary to PICC line.        OT Diagnosis:     OT Problem List:     OT Treatment/Interventions:      OT Goals(Current goals can be found in the care plan section)    OT Frequency:     Barriers to D/C:            Co-evaluation              End of Session Equipment Utilized During Treatment: Gait belt  Activity Tolerance: Patient tolerated treatment well Patient left: in bed;with call bell/phone within reach;with family/visitor present    Time: 1155-1209 OT Time Calculation (min): 14 min Charges:  OT General Charges $OT Visit: 1 Procedure OT Evaluation $Initial OT Evaluation Tier I: 1 Procedure G-Codes:    Almyra Deforest, OTR/L 02/09/2015, 12:58 PM

## 2015-02-09 NOTE — Progress Notes (Addendum)
[facility] PROGRESS NOTE  DARON BREEDING FXO:[id number] DOB: September 02, 1951 DOA: 02/08/2015 PCP: Eulas Post, MD  Brief Summary  The patient is a 64 year old female with history of hypertension, hyperlipidemia, GERD, asthma, pancreatic cancer status post Whipple and radiation currently on chemotherapy, who presented with fever. Her last dose of chemotherapy was one day prior to presentation. She developed fever on the same day with some mild abdominal comfort but no cough, shortness of breath, nausea, vomiting, diarrhea, dysuria. In the emergency department, she had a fever to 100.8 Fahrenheit, with blood cell count 4.4. Chest x-ray demonstrated left lower lobe pneumonia with a moderate left pleural effusion. Urinalysis was negative.  Assessment/Plan  Sepsis due to HCAP as evidenced by chest x-ray. There is no signs of infection in her Port-A line. Patient is mildly septic with tachycardia and fever. She is hemodynamically stable. - Tele:  NSR -  Okay to d/c telemetry - Continue IV Vancomycin and cefepime  - Urine legionella and S. pneumococcal antigen - Follow up blood culture x2, sputum culture, respiratory virus panel - procalciton - mildly elevated - lactic acid neg - Zofran for Nausea  - US-guided thoracentesis, LEFT - will need to send for cytology to exclude recurrence of malignancy in addition to cell count, culture, etc.   -  If not improving, consider PICC line infection  HTN: -hold HCTZ -continue lisinopril  Asthma: stable. No wheezing on auscultation -Continue Symbicort  Hyperlipidemia: -Continue Lipitor  Pancreatic cancer: She is post status of Whipple procedure and radiation therapy. Patient is followed up by Dr. Benay Spice. She is currently treated with chemotherapy. Last dose was yesterday. - Added Dr. Benay Spice to rounding team  Mild elevation of alk-phos and LFTs likely related to whipple -  Trend CMP  Anemia of chronic disease, hgb baseline  9.3-9.6 -  Hemoglobin trending down, likely due to dehydration  -  Repeat in AM and transfuse to keep hgb > 7   Thrombocytopenia, mild and asymptomatic, rising spontaneously -  Repeat in AM  Severe protein-calorie malnutrition -  Continue TPN -  Regular diet -  Supplements -  Nutrition consultation  Diet:  regular Access:  port IVF:  off Proph:  lovenox (post-thoracentesis  Code Status: full Family Communication: patient and sister Disposition Plan: likely for a few more days to follow up blood cultures and thoracentesis results   Consultants:  Radiology  Oncology  Procedures:  CXR  Antibiotics:  vanc 3/11 >>  Cefepime 3/11 >>  HPI/Subjective:  Coughing, having rigors.    Objective: Filed Vitals:   02/09/15 0132 02/09/15 0234 02/09/15 0632 02/09/15 1036  BP:  123/70 136/77 150/77  Pulse:  99 80 129  Temp: 99.3 F (37.4 C) 100.8 F (38.2 C) 98.3 F (36.8 C) 98.2 F (36.8 C)  TempSrc: Oral Rectal Oral Axillary  Resp:  18 18   Height:   '5\' 3"'$  (1.6 m)   Weight:   78.3 kg (172 lb 9.9 oz)   SpO2:  100% 99%     Intake/Output Summary (Last 24 hours) at 02/09/15 1042 Last data filed at 02/09/15 0858  Gross per 24 hour  Intake     30 ml  Output      0 ml  Net     30 ml   Filed Weights   02/09/15 6004  Weight: 78.3 kg (172 lb 9.9 oz)    Exam:   General:  Adult F, No acute distress  HEENT:  NCAT, MMM  Cardiovascular:  RRR, nl  S1, S2 no mrg, 2+ pulses, warm extremities  Respiratory:  Diminished at bilateral bases, no focal rales or rhonchi, no increased WOB  Abdomen:   NABS, soft, NT/ND  MSK:   Normal tone and bulk, no LEE  Neuro:  Grossly intact  Data Reviewed: Basic Metabolic Panel:  Recent Labs Lab 02/08/15 1132 02/09/15 0300  NA 140 135  K 3.9 3.7  CL  --  108  CO2 22 22  GLUCOSE 113 145*  BUN 19.1 22  CREATININE 0.7 0.69  CALCIUM 8.8 8.4   Liver Function Tests:  Recent Labs Lab 02/08/15 1132 02/09/15 0300  AST 47*  53*  ALT 48 44*  ALKPHOS 279* 229*  BILITOT 0.45 0.6  PROT 6.7 6.3  ALBUMIN 2.8* 2.8*    Recent Labs Lab 02/09/15 0300  LIPASE 10*   No results for input(s): AMMONIA in the last 168 hours. CBC:  Recent Labs Lab 02/08/15 1130 02/09/15 0300  WBC 2.4* 4.4  NEUTROABS 1.7 3.7  HGB 9.3* 8.5*  HCT 28.8* 26.9*  MCV 87.3 89.4  PLT 121* 138*   Cardiac Enzymes: No results for input(s): CKTOTAL, CKMB, CKMBINDEX, TROPONINI in the last 168 hours. BNP (last 3 results) No results for input(s): BNP in the last 8760 hours.  ProBNP (last 3 results) No results for input(s): PROBNP in the last 8760 hours.  CBG: No results for input(s): GLUCAP in the last 168 hours.  Recent Results (from the past 240 hour(s))  TECHNOLOGIST REVIEW     Status: None   Collection Time: 02/08/15 11:30 AM  Result Value Ref Range Status   Technologist Review Metas and Myelocytes present  Final     Studies: Dg Chest 2 View  02/09/2015   CLINICAL DATA:  Fever. History of pancreatic cancer, currently receiving chemotherapy.  EXAM: CHEST  2 VIEW  COMPARISON:  Most recent chest radiograph 12/13/2014.  FINDINGS: Tip of the right upper extremity PICC in the mid SVC. Tip of the left chest port in the mid SVC. Development of left basilar airspace disease and left pleural effusion. The right lung is clear. Cardiomediastinal contours are normal. No pneumothorax.  IMPRESSION: Left lower lobe airspace disease and moderate left pleural effusion, may reflect pneumonia with parapneumonic effusion given history of fever.   Electronically Signed   By: Jeb Levering M.D.   On: 02/09/2015 05:07    Scheduled Meds: . atorvastatin  10 mg Oral Daily  . ceFEPime (MAXIPIME) IV  1 g Intravenous 3 times per day  . heparin  5,000 Units Subcutaneous 3 times per day  . lisinopril  40 mg Oral Daily  . loratadine  10 mg Oral Daily  . metoCLOPramide  10 mg Oral TID  . ondansetron  4 mg Intravenous Once  . sodium chloride  10-40 mL  Intracatheter Q12H  . vancomycin  1,000 mg Intravenous Q12H   Continuous Infusions: . sodium chloride 125 mL/hr at 02/09/15 0700    Principal Problem:   HCAP (healthcare-associated pneumonia) Active Problems:   Osteoarthritis   History of depression   Essential hypertension, benign   Hyperlipidemia   Adenocarcinoma of uncinate process of pancreas   Asthma    Time spent: 30 min    Delle Andrzejewski, [facility] Pager [phone]. If 7PM-7AM, please contact night-coverage at [email or web address], password [email or web address] 02/09/2015, 10:42 AM  LOS: 0 days

## 2015-02-09 NOTE — ED Notes (Signed)
Pt declines blood work at this time, states she was seen yesterday, Wednesday, at the cancer center and they did blood work, also states fevr is coming down.

## 2015-02-09 NOTE — Progress Notes (Addendum)
ANTICOAGULATION CONSULT NOTE - Initial Consult  Pharmacy Consult for enoxaparin Indication: VTE prophylaxis  No Known Allergies  Patient Measurements: Height: 5\' 3"  (160 cm) Weight: 172 lb 9.9 oz (78.3 kg) IBW/kg (Calculated) : 52.4 Heparin Dosing Weight:   Labs:  Recent Labs  02/08/15 1130 02/08/15 1132 02/09/15 0300 02/09/15 0852  HGB 9.3*  --  8.5*  --   HCT 28.8*  --  26.9*  --   PLT 121*  --  138*  --   APTT  --   --   --  36  LABPROT  --   --   --  16.2*  INR  --   --   --  1.29  CREATININE  --  0.7 0.69  --     Estimated Creatinine Clearance: 69.5 mL/min (by C-G formula based on Cr of 0.69).  Assessment: 42 YOF presents with HCAP, pharmacy asked to dose enoxaparin for VTE prophylaxis.  IR has evaluated for possible thoracentesis 3/11 but per [place] there is only a small pleural effusion and no thoracentesis is required at this time.     CBC: Hgb = 8.5, pltc = 138  Renal: Scr WNL, CrCl > 85ml.min  Goal of Therapy:  Dose for indication, weight and renal function  Plan:   No thoracentesis to be done today, so start enoxaparin 40mg  SQ q24h  Watch platelet count and Hgb  No dose further adjustment needed  Doreene Eland, PharmD, BCPS.   Pager: [phone] 02/09/2015,5:06 PM

## 2015-02-09 NOTE — Progress Notes (Signed)
ANTIBIOTIC CONSULT NOTE - INITIAL  Pharmacy Consult for Vancomycin and Cefepime Indication: PNA  No Known Allergies  Patient Measurements: Height: 5\' 3"  (160 cm) Weight: 172 lb 9.9 oz (78.3 kg) IBW/kg (Calculated) : 52.4   Vital Signs: Temp: 98.3 F (36.8 C) (03/11 0632) Temp Source: Oral (03/11 1219) BP: 136/77 mmHg (03/11 7588) Pulse Rate: 80 (03/11 0632) Intake/Output from previous day:   Intake/Output from this shift:    Labs:  Recent Labs  02/08/15 1130 02/08/15 1132 02/09/15 0300  WBC 2.4*  --  4.4  HGB 9.3*  --  8.5*  PLT 121*  --  138*  CREATININE  --  0.7 0.69   Estimated Creatinine Clearance: 69.5 mL/min (by C-G formula based on Cr of 0.69). No results for input(s): VANCOTROUGH, VANCOPEAK, VANCORANDOM, GENTTROUGH, GENTPEAK, GENTRANDOM, TOBRATROUGH, TOBRAPEAK, TOBRARND, AMIKACINPEAK, AMIKACINTROU, AMIKACIN in the last 72 hours.   Microbiology: Recent Results (from the past 720 hour(s))  TECHNOLOGIST REVIEW     Status: None   Collection Time: 02/08/15 11:30 AM  Result Value Ref Range Status   Technologist Review Metas and Myelocytes present  Final    Medical History: Past Medical History  Diagnosis Date  . Chicken pox   . Depression   . Hypertension   . Hyperlipidemia   . Polyp of colon   . Shingles JUNE 2015    RIGHT  SHOULDER  . Anxiety     since diagnosis  . Allergy   . Pancreatic cancer 07/12/14    Adenocarcinoma. Chemo/ radiation completed early 9'15-Dr. Sherril follows  . Hx of radiation therapy 08/10/14-09/21/14    pancreas 50.4Gy/64fx  . Asthma     exercise induced, seasonal   . GERD (gastroesophageal reflux disease)     uses TUM  PRN, since chemo-   . Arthritis     mild, hips   . Status post PICC central line placement     10/2014    Medications:  Scheduled:  . atorvastatin  10 mg Oral Daily  . ceFEPime (MAXIPIME) IV  1 g Intravenous 3 times per day  . heparin  5,000 Units Subcutaneous 3 times per day  . lisinopril  40 mg  Oral Daily  . loratadine  10 mg Oral Daily  . metoCLOPramide  10 mg Oral TID  . ondansetron  4 mg Intravenous Once  . vancomycin  1,000 mg Intravenous Q12H   Infusions:  . sodium chloride 125 mL/hr at 02/09/15 0700   Assessment: 33 yoF with pancreatic Ca s/p Whipple procedure, radiation and Chemo LD 3/10. Now with fever.  Cefepime and Vancomycin per Rx for PNA.   Goal of Therapy:  Vancomycin trough level 15-20 mcg/ml  Plan:   Cefepime 1GM IV q8h  Vancomycin 1Gm IV q12h  F/u Scr/cultures/levels as needed  Dorrene German 02/09/2015,7:07 AM

## 2015-02-09 NOTE — ED Provider Notes (Addendum)
TIME SEEN: 1:30 AM  CHIEF COMPLAINT: Fever  HPI: Pt is a 64 y.o. female with history of pancreatic cancer who last received chemotherapy today who presents emergency department complaints of fever of 102.9 at home. Denies headache, neck pain or neck stiffness, sore throat, air pain, cough, chest pain or shortness of breath, vomiting or diarrhea, rash. Oncologist is Dr. Ammie Dalton.  ROS: See HPI Constitutional: fever  Eyes: no drainage  ENT: no runny nose   Cardiovascular:  no chest pain  Resp: no SOB  GI: no vomiting GU: no dysuria Integumentary: no rash  Allergy: no hives  Musculoskeletal: no leg swelling  Neurological: no slurred speech ROS otherwise negative  PAST MEDICAL HISTORY/PAST SURGICAL HISTORY:  Past Medical History  Diagnosis Date  . Chicken pox   . Depression   . Hypertension   . Hyperlipidemia   . Polyp of colon   . Shingles JUNE 2015    RIGHT  SHOULDER  . Anxiety     since diagnosis  . Allergy   . Pancreatic cancer 07/12/14    Adenocarcinoma. Chemo/ radiation completed early 9'15-Dr. Sherril follows  . Hx of radiation therapy 08/10/14-09/21/14    pancreas 50.4Gy/21fx  . Asthma     exercise induced, seasonal   . GERD (gastroesophageal reflux disease)     uses TUM  PRN, since chemo-   . Arthritis     mild, hips   . Status post PICC central line placement     10/2014    MEDICATIONS:  Prior to Admission medications   Medication Sig Start Date End Date Taking? Authorizing Provider  atorvastatin (LIPITOR) 10 MG tablet TAKE 1 TABLET BY MOUTH EVERY DAY 09/18/14  Yes Eulas Post, MD  calcium carbonate (TUMS - DOSED IN MG ELEMENTAL CALCIUM) 500 MG chewable tablet Chew 2 tablets by mouth daily as needed for indigestion or heartburn.    Yes Historical Provider, MD  hydrochlorothiazide (HYDRODIURIL) 25 MG tablet TAKE 1 TABLET BY MOUTH EVERY DAY 09/18/14  Yes Eulas Post, MD  lidocaine-prilocaine (EMLA) cream Apply 1 application topically as needed. Apply  to port 1-2 hours prior to stick and cover with plastic wrap 12/19/14  Yes Ladell Pier, MD  lisinopril (PRINIVIL,ZESTRIL) 40 MG tablet TAKE 1 TABLET BY MOUTH EVERY DAY Patient taking differently: TAKE 1 TABLET BY MOUTH EVERY DAY, morning 09/18/14  Yes Eulas Post, MD  LORazepam (ATIVAN) 0.5 MG tablet TAKE 1 TABLET BY MOUTH EVERY 6 TO 8 HOURS AS NEEDED SEVERE ANXIETY 01/18/15  Yes Eulas Post, MD  metoCLOPramide (REGLAN) 10 MG tablet Take 10 mg by mouth 3 (three) times daily. Taking as needed 12/07/14  Yes Historical Provider, MD  budesonide-formoterol (SYMBICORT) 160-4.5 MCG/ACT inhaler Inhale 1 puff into the lungs daily as needed (asthma symptoms).     Historical Provider, MD  cetirizine (ZYRTEC) 10 MG tablet Take 10 mg by mouth daily as needed for allergies or rhinitis.     Historical Provider, MD  ibuprofen (ADVIL,MOTRIN) 200 MG tablet Take 600 mg by mouth 2 (two) times daily as needed (pain).     Historical Provider, MD  lipase/protease/amylase 24000 UNITS CPEP Take 1 capsule (24,000 Units total) by mouth 3 (three) times daily before meals. Patient not taking: Reported on 02/08/2015 11/20/14   Ralene Ok, MD  ondansetron (ZOFRAN) 8 MG tablet Take 1 tablet (8 mg total) by mouth every 8 (eight) hours as needed for nausea or vomiting. Patient not taking: Reported on 02/08/2015 11/27/14   Dominica Severin  Darrold Junker, MD  promethazine (PHENERGAN) 12.5 MG tablet Take 1/2 tablet to 1 tablet every 6 hours as needed for nausea Patient not taking: Reported on 02/08/2015 11/27/14   Ladell Pier, MD    ALLERGIES:  No Known Allergies  SOCIAL HISTORY:  History  Substance Use Topics  . Smoking status: Never Smoker   . Smokeless tobacco: Never Used  . Alcohol Use: Yes     Comment: VERY RARE    FAMILY HISTORY: Family History  Problem Relation Age of Onset  . Arthritis Mother   . Hypertension Mother   . Alcohol abuse Father   . Cancer Father     brain tumor  . Cancer Cousin     breast ca,      EXAM: BP 150/76 mmHg  Pulse 117  Temp(Src) 99.3 F (37.4 C) (Oral)  Resp 18  SpO2 94%  LMP 10/31/2005 CONSTITUTIONAL: Alert and oriented and responds appropriately to questions. Well-appearing; well-nourished HEAD: Normocephalic EYES: Conjunctivae clear, PERRL ENT: normal nose; no rhinorrhea; moist mucous membranes; pharynx without lesions noted NECK: Supple, no meningismus, no LAD  CARD: Regular and tachycardic; S1 and S2 appreciated; no murmurs, no clicks, no rubs, no gallops RESP: Normal chest excursion without splinting or tachypnea; breath sounds clear and equal bilaterally; no wheezes, no rhonchi, no rales, patient has a port in the left chest wall is nontender to palpation with no surrounding erythema or warmth ABD/GI: Normal bowel sounds; non-distended; soft, non-tender, no rebound, no guarding BACK:  The back appears normal and is non-tender to palpation, there is no CVA tenderness EXT: Normal ROM in all joints; non-tender to palpation; no edema; normal capillary refill; no cyanosis; patient has a PICC line in the right upper extremity with no tenderness source running erythema or warmth or drainage, she is receiving TPN through this PICC line    SKIN: Normal color for age and race; warm NEURO: Moves all extremities equally PSYCH: The patient's mood and manner are appropriate. Grooming and personal hygiene are appropriate.  MEDICAL DECISION MAKING: Patient here with fever, tachycardia. She is normotensive. Will obtain septic workup. Will give antipyretics, IV fluids. She has no specific complaints and exam does not show obvious sign of infection.  ED PROGRESS: Patient's labs unremarkable. Normal lactate. LFTs elevated which is baseline.  She is not neutropenic. Chest x-ray shows a left lower lobe airspace opacity with effusion which may reflect pneumonia. We'll start vancomycin and Zosyn given she is a meal compromise. Discussed with Dr. Blaine Hamper for admission to inpatient,  telemetry. I will place holding orders. Patient updated with plan. Cultures pending.      EKG Interpretation  Date/Time:  Friday February 09 2015 02:44:56 EST Ventricular Rate:  98 PR Interval:  151 QRS Duration: 80 QT Interval:  325 QTC Calculation: 415 R Axis:   33 Text Interpretation:  Sinus rhythm Borderline T wave abnormalities No significant change since last tracing Confirmed by Jakara Blatter,  DO, Armonii Sieh ([id number]) on 02/09/2015 3:02:26 AM         CRITICAL CARE Performed by: Nyra Jabs   Total critical care time: 45 minutes - patient with pneumonia, meets sepsis criteria, immunocompromised, broad-spectrum antibiotics  Critical care time was exclusive of separately billable procedures and treating other patients.  Critical care was necessary to treat or prevent imminent or life-threatening deterioration.  Critical care was time spent personally by me on the following activities: development of treatment plan with patient and/or surrogate as well as nursing, discussions with consultants, evaluation  of patient's response to treatment, examination of patient, obtaining history from patient or surrogate, ordering and performing treatments and interventions, ordering and review of laboratory studies, ordering and review of radiographic studies, pulse oximetry and re-evaluation of patient's condition.   [place], DO 02/09/15 [place], DO 02/09/15 [phone]

## 2015-02-09 NOTE — Progress Notes (Signed)
Spoke with pt and family at bedside pt is active with [facility] and will continue at discharge.  Will need resumption of Home Health orders.

## 2015-02-09 NOTE — Progress Notes (Signed)
PT Cancellation Note / Screen  Patient Details Name: Sonya Larson MRN: [id number] DOB: Apr 13, 1951   Cancelled Treatment:    Reason Eval/Treat Not Completed: PT screened, no needs identified, will sign off  Pt declines any acute PT needs at this time.  PT to sign off.   Danikah Budzik,KATHrine E 02/09/2015, 2:17 PM Carmelia Bake, PT, DPT 02/09/2015 Pager: [phone]

## 2015-02-09 NOTE — H&P (Signed)
[facility] History and Physical  Sonya Larson TDV:[id number] DOB: 1951/03/07 DOA: 02/08/2015  Referring physician: ED physician PCP: Eulas Post, MD  Specialists:   Chief Complaint: Fever  HPI: Sonya Larson is a 64 y.o. female with past medical history of hypertension, hyperlipidemia, GERD, asthma, pancreatic cancer, currently on chemotherapy, who presents with fever.  Patient was diagnosed as pancreatic cancer. She is post status of Whipple procedure and radiation therapy. She is currently treated with chemotherapy. The last dose of chemotherapy was yesterday. Patient reports that she developed fever yesterday. She has mild abdominal discomfort due to pancreatic cancer. Patient denies headaches, cough, chest pain, SOB, diarrhea, constipation, dysuria, urgency, frequency, hematuria, skin rashes or leg swelling. No unilateral weakness, numbness or tingling sensations. No vision change or hearing loss.  In ED, patient was found to have tachycardia, temperature 100.8, WBC 4.4, renal function okay. CXR showed Left lower lobe airspace disease and moderate left pleural effusion, may reflect pneumonia with parapneumonic effusion given history of fever per radiologist. Patient is admitted to inpatient for further evaluation and treatment.  Review of Systems: As presented in the history of presenting illness, rest negative.  Where does patient live?  At home Can patient participate in ADLs? Some  Allergy: No Known Allergies  Past Medical History  Diagnosis Date  . Chicken pox   . Depression   . Hypertension   . Hyperlipidemia   . Polyp of colon   . Shingles JUNE 2015    RIGHT  SHOULDER  . Anxiety     since diagnosis  . Allergy   . Pancreatic cancer 07/12/14    Adenocarcinoma. Chemo/ radiation completed early 9'15-Dr. Sherril follows  . Hx of radiation therapy 08/10/14-09/21/14    pancreas 50.4Gy/66fx  . Asthma     exercise induced, seasonal   . GERD (gastroesophageal  reflux disease)     uses TUM  PRN, since chemo-   . Arthritis     mild, hips   . Status post PICC central line placement     10/2014    Past Surgical History  Procedure Laterality Date  . Breast reduction surgery Bilateral   . Colonscopy   LAST DONE SEPT 2014    X 3  . Hysteroscopy  2005    "heavy menopause"  . Eus N/A 07/12/2014    Procedure: ESOPHAGEAL ENDOSCOPIC ULTRASOUND (EUS) RADIAL;  Surgeon: Arta Silence, MD;  Location: [facility];  Service: Endoscopy;  Laterality: N/A;  . Fine needle aspiration N/A 07/12/2014    Procedure: FINE NEEDLE ASPIRATION (FNA) RADIAL;  Surgeon: Arta Silence, MD;  Location: [facility];  Service: Endoscopy;  Laterality: N/A;  . Laparoscopy N/A 07/24/2014    Procedure: LAPAROSCOPY DIAGNOSTIC;  Surgeon: Stark Klein, MD;  Location: [place];  Service: General;  Laterality: N/A;-"no unusual fingings"  . Eus N/A 10/25/2014    Procedure: ESOPHAGEAL ENDOSCOPIC ULTRASOUND (EUS) RADIAL;  Surgeon: Arta Silence, MD;  Location: [facility];  Service: Endoscopy;  Laterality: N/A;  . Fine needle aspiration N/A 10/25/2014    Procedure: FINE NEEDLE ASPIRATION (FNA) LINEAR;  Surgeon: Arta Silence, MD;  Location: [facility];  Service: Endoscopy;  Laterality: N/A;  . Laparoscopy N/A 11/02/2014    Procedure: LAPAROSCOPY DIAGNOSTIC;  Surgeon: Stark Klein, MD;  Location: [place];  Service: General;  Laterality: N/A;  . Whipple procedure N/A 11/02/2014    Procedure: WHIPPLE PROCEDURE;  Surgeon: Stark Klein, MD;  Location: [place];  Service: General;  Laterality: N/A;  . Portacath placement Left 12/13/2014  Procedure: INSERTION PORT-A-CATH;  Surgeon: Stark Klein, MD;  Location: [place];  Service: General;  Laterality: Left;    Social History:  reports that she has never smoked. She has never used smokeless tobacco. She reports that she drinks alcohol. She reports that she does not use illicit drugs.  Family History:  Family History  Problem  Relation Age of Onset  . Arthritis Mother   . Hypertension Mother   . Alcohol abuse Father   . Cancer Father     brain tumor  . Cancer Cousin     breast ca,      Prior to Admission medications   Medication Sig Start Date End Date Taking? Authorizing Provider  atorvastatin (LIPITOR) 10 MG tablet TAKE 1 TABLET BY MOUTH EVERY DAY 09/18/14  Yes Eulas Post, MD  calcium carbonate (TUMS - DOSED IN MG ELEMENTAL CALCIUM) 500 MG chewable tablet Chew 2 tablets by mouth daily as needed for indigestion or heartburn.    Yes Historical Provider, MD  hydrochlorothiazide (HYDRODIURIL) 25 MG tablet TAKE 1 TABLET BY MOUTH EVERY DAY 09/18/14  Yes Eulas Post, MD  lidocaine-prilocaine (EMLA) cream Apply 1 application topically as needed. Apply to port 1-2 hours prior to stick and cover with plastic wrap 12/19/14  Yes Ladell Pier, MD  lisinopril (PRINIVIL,ZESTRIL) 40 MG tablet TAKE 1 TABLET BY MOUTH EVERY DAY Patient taking differently: TAKE 1 TABLET BY MOUTH EVERY DAY, morning 09/18/14  Yes Eulas Post, MD  LORazepam (ATIVAN) 0.5 MG tablet TAKE 1 TABLET BY MOUTH EVERY 6 TO 8 HOURS AS NEEDED SEVERE ANXIETY 01/18/15  Yes Eulas Post, MD  metoCLOPramide (REGLAN) 10 MG tablet Take 10 mg by mouth 3 (three) times daily. Taking as needed 12/07/14  Yes Historical Provider, MD  budesonide-formoterol (SYMBICORT) 160-4.5 MCG/ACT inhaler Inhale 1 puff into the lungs daily as needed (asthma symptoms).     Historical Provider, MD  cetirizine (ZYRTEC) 10 MG tablet Take 10 mg by mouth daily as needed for allergies or rhinitis.     Historical Provider, MD  ibuprofen (ADVIL,MOTRIN) 200 MG tablet Take 600 mg by mouth 2 (two) times daily as needed (pain).     Historical Provider, MD  lipase/protease/amylase 24000 UNITS CPEP Take 1 capsule (24,000 Units total) by mouth 3 (three) times daily before meals. Patient not taking: Reported on 02/08/2015 11/20/14   Ralene Ok, MD  ondansetron (ZOFRAN) 8 MG  tablet Take 1 tablet (8 mg total) by mouth every 8 (eight) hours as needed for nausea or vomiting. Patient not taking: Reported on 02/08/2015 11/27/14   Ladell Pier, MD  promethazine (PHENERGAN) 12.5 MG tablet Take 1/2 tablet to 1 tablet every 6 hours as needed for nausea Patient not taking: Reported on 02/08/2015 11/27/14   Ladell Pier, MD    Physical Exam: Filed Vitals:   02/08/15 2352 02/09/15 0132 02/09/15 0234  BP: 150/76  123/70  Pulse: 117  99  Temp: 100.9 F (38.3 C) 99.3 F (37.4 C) 100.8 F (38.2 C)  TempSrc: Oral Oral Rectal  Resp: 18  18  SpO2: 94%  100%   General: Not in acute distress HEENT:       Eyes: PERRL, EOMI, no scleral icterus       ENT: No discharge from the ears and nose, no pharynx injection, no tonsillar enlargement.        Neck: No JVD, no bruit, no mass felt. Cardiac: S1/S2, RRR, No murmurs, No gallops or rubs  Pulm: Good air movement bilaterally. Clear to auscultation bilaterally. No rales, wheezing, rhonchi or rubs. There is Port-A line in left upper chest and the surrounding is clean. Abd: Soft, nondistended, mildly tender, no rebound pain, no organomegaly, BS present Ext: No edema bilaterally. 2+DP/PT pulse bilaterally Musculoskeletal: No joint deformities, erythema, or stiffness, ROM full Skin: No rashes.  Neuro: Alert and oriented X3, cranial nerves II-XII grossly intact, muscle strength 5/5 in all extremeties, sensation to light touch intact.  Psych: Patient is not psychotic, no suicidal or hemocidal ideation.  Labs on Admission:  Basic Metabolic Panel:  Recent Labs Lab 02/08/15 1132 02/09/15 0300  NA 140 135  K 3.9 3.7  CL  --  108  CO2 22 22  GLUCOSE 113 145*  BUN 19.1 22  CREATININE 0.7 0.69  CALCIUM 8.8 8.4   Liver Function Tests:  Recent Labs Lab 02/08/15 1132 02/09/15 0300  AST 47* 53*  ALT 48 44*  ALKPHOS 279* 229*  BILITOT 0.45 0.6  PROT 6.7 6.3  ALBUMIN 2.8* 2.8*    Recent Labs Lab 02/09/15 0300  LIPASE  10*   No results for input(s): AMMONIA in the last 168 hours. CBC:  Recent Labs Lab 02/08/15 1130 02/09/15 0300  WBC 2.4* 4.4  NEUTROABS 1.7 3.7  HGB 9.3* 8.5*  HCT 28.8* 26.9*  MCV 87.3 89.4  PLT 121* 138*   Cardiac Enzymes: No results for input(s): CKTOTAL, CKMB, CKMBINDEX, TROPONINI in the last 168 hours.  BNP (last 3 results) No results for input(s): BNP in the last 8760 hours.  ProBNP (last 3 results) No results for input(s): PROBNP in the last 8760 hours.  CBG: No results for input(s): GLUCAP in the last 168 hours.  Radiological Exams on Admission: Dg Chest 2 View  02/09/2015   CLINICAL DATA:  Fever. History of pancreatic cancer, currently receiving chemotherapy.  EXAM: CHEST  2 VIEW  COMPARISON:  Most recent chest radiograph 12/13/2014.  FINDINGS: Tip of the right upper extremity PICC in the mid SVC. Tip of the left chest port in the mid SVC. Development of left basilar airspace disease and left pleural effusion. The right lung is clear. Cardiomediastinal contours are normal. No pneumothorax.  IMPRESSION: Left lower lobe airspace disease and moderate left pleural effusion, may reflect pneumonia with parapneumonic effusion given history of fever.   Electronically Signed   By: Jeb Levering M.D.   On: 02/09/2015 05:07    EKG: Independently reviewed.   Assessment/Plan Principal Problem:   HCAP (healthcare-associated pneumonia) Active Problems:   Osteoarthritis   History of depression   Essential hypertension, benign   Hyperlipidemia   Adenocarcinoma of uncinate process of pancreas   Asthma  Fever: it is likely due to HCAP as evidenced by chest x-ray. There is no signs of infection in her Port-A line. Patient is mildly septic with tachycardia and fever. She is hemodynamically stable. - Will admit to Telemetry Bed - IV Vancomycin and cefepime  - Urine legionella and S. pneumococcal antigen - IVF: 125 cc/h - Follow up blood culture x2, sputum culture,  respiratory virus panel and flu pcr - Zofran for Nausea  - will get Procalcitonin and trend lactic acid level  HTN: -hold HCTZ -continue lisinopril  Asthma: stable. No wheezing on auscultation -Continue Symbicort  Hyperlipidemia: -Continue Lipitor  Pancreatic cancer:  She is post status of Whipple procedure and radiation therapy. Patient is followed up by Dr. Benay Larson. She is currently treated with chemotherapy. Last dose was yesterday. -Follow-up with Dr.  Sherrill   DVT ppx: SQ Heparin    Code Status: Full code Family Communication: Yes, patient's    husband   at bed side Disposition Plan: Admit to inpatient   Date of Service 02/09/2015    Ivor Costa [facility] Pager [phone]  If 7PM-7AM, please contact night-coverage [email or web address] Password [facility] 02/09/2015, 6:05 AM

## 2015-02-10 DIAGNOSIS — T451X5A Adverse effect of antineoplastic and immunosuppressive drugs, initial encounter: Secondary | ICD-10-CM

## 2015-02-10 DIAGNOSIS — A419 Sepsis, unspecified organism: Secondary | ICD-10-CM

## 2015-02-10 DIAGNOSIS — D61810 Antineoplastic chemotherapy induced pancytopenia: Secondary | ICD-10-CM

## 2015-02-10 LAB — URINE CULTURE
CULTURE: NO GROWTH
Colony Count: NO GROWTH

## 2015-02-10 LAB — COMPREHENSIVE METABOLIC PANEL
ALBUMIN: 2.6 g/dL — AB (ref 3.5–5.2)
ALT: 47 U/L — AB (ref 0–35)
AST: 52 U/L — AB (ref 0–37)
Alkaline Phosphatase: 215 U/L — ABNORMAL HIGH (ref 39–117)
Anion gap: 4 — ABNORMAL LOW (ref 5–15)
BILIRUBIN TOTAL: 0.7 mg/dL (ref 0.3–1.2)
BUN: 15 mg/dL (ref 6–23)
CALCIUM: 8.1 mg/dL — AB (ref 8.4–10.5)
CO2: 23 mmol/L (ref 19–32)
Chloride: 111 mmol/L (ref 96–112)
Creatinine, Ser: 0.67 mg/dL (ref 0.50–1.10)
GFR calc Af Amer: >90 mL/min (ref >90)
GFR calc non Af Amer: >90 mL/min (ref >90)
Glucose, Bld: 136 mg/dL — ABNORMAL HIGH (ref 70–99)
Potassium: 3.5 mmol/L (ref 3.5–5.1)
Sodium: 138 mmol/L (ref 135–145)
TOTAL PROTEIN: 5.9 g/dL — AB (ref 6.0–8.3)

## 2015-02-10 LAB — DIFFERENTIAL
BASOS ABS: 0 10*3/uL (ref 0.0–0.1)
Basophils Relative: 0 % (ref 0–1)
EOS PCT: 3 % (ref 0–5)
Eosinophils Absolute: 0.1 10*3/uL (ref 0.0–0.7)
LYMPHS ABS: 0.2 10*3/uL — AB (ref 0.7–4.0)
Lymphocytes Relative: 8 % — ABNORMAL LOW (ref 12–46)
MONO ABS: 0.1 10*3/uL (ref 0.1–1.0)
Monocytes Relative: 4 % (ref 3–12)
Neutro Abs: 1.9 10*3/uL (ref 1.7–7.7)
Neutrophils Relative %: 85 % — ABNORMAL HIGH (ref 43–77)

## 2015-02-10 LAB — GLUCOSE, CAPILLARY
GLUCOSE-CAPILLARY: 130 mg/dL — AB (ref 70–99)
GLUCOSE-CAPILLARY: 187 mg/dL — AB (ref 70–99)
Glucose-Capillary: 103 mg/dL — ABNORMAL HIGH (ref 70–99)

## 2015-02-10 LAB — CBC
HCT: 24.2 % — ABNORMAL LOW (ref 36.0–46.0)
Hemoglobin: 7.7 g/dL — ABNORMAL LOW (ref 12.0–15.0)
MCH: 28.1 pg (ref 26.0–34.0)
MCHC: 31.8 g/dL (ref 30.0–36.0)
MCV: 88.3 fL (ref 78.0–100.0)
Platelets: 134 10*3/uL — ABNORMAL LOW (ref 150–400)
RBC: 2.74 MIL/uL — AB (ref 3.87–5.11)
RDW: 19.4 % — ABNORMAL HIGH (ref 11.5–15.5)
WBC: 2.3 10*3/uL — ABNORMAL LOW (ref 4.0–10.5)

## 2015-02-10 LAB — MAGNESIUM: MAGNESIUM: 2 mg/dL (ref 1.5–2.5)

## 2015-02-10 LAB — PHOSPHORUS: PHOSPHORUS: 3.5 mg/dL (ref 2.3–4.6)

## 2015-02-10 LAB — TRIGLYCERIDES: Triglycerides: 78 mg/dL (ref <150)

## 2015-02-10 LAB — PREALBUMIN: Prealbumin: 11.7 mg/dL — ABNORMAL LOW (ref 17.0–34.0)

## 2015-02-10 MED ORDER — INSULIN ASPART 100 UNIT/ML ~~LOC~~ SOLN
0.0000 [IU] | Freq: Three times a day (TID) | SUBCUTANEOUS | Status: DC
  Administered 2015-02-11: 2 [IU] via SUBCUTANEOUS

## 2015-02-10 MED ORDER — SODIUM CHLORIDE 0.9 % IV SOLN: INTRAVENOUS | Status: DC

## 2015-02-10 MED ORDER — SODIUM CHLORIDE 0.9 % IV SOLN: Freq: Once | INTRAVENOUS | Status: DC

## 2015-02-10 MED ORDER — NONFORMULARY OR COMPOUNDED ITEM
1500.0000 mL/d | Status: AC
Start: 2015-02-10 — End: 2015-02-11
  Administered 2015-02-11: 1500 mL/d via INTRAVENOUS
  Filled 2015-02-10: qty 1

## 2015-02-10 NOTE — Progress Notes (Addendum)
PARENTERAL NUTRITION CONSULT NOTE - FOLLOW UP  Pharmacy Consult for TPN Indication: Intolerance of enteral feeding due to pancreatic cancer.  No Known Allergies  Patient Measurements: Height: 5\' 3"  (160 cm) Weight: 172 lb 9.9 oz (78.3 kg) IBW/kg (Calculated) : 52.4 Adjusted Body Weight: 60 kg Usual Weight: 77 kg  Vital Signs: Temp: 98.3 F (36.8 C) (03/12 0419) Temp Source: Oral (03/12 0419) BP: 134/72 mmHg (03/12 0419) Pulse Rate: 80 (03/12 0419) Intake/Output from previous day: 03/11 0701 - 03/12 0700 In: 1270 [P.O.:480; I.V.:40; IV Piggyback:750] Out: -  Intake/Output from this shift:    Labs:  Recent Labs  02/08/15 1130 02/09/15 0300 02/09/15 0852 02/10/15 0420  WBC 2.4* 4.4  --  2.3*  HGB 9.3* 8.5*  --  7.7*  HCT 28.8* 26.9*  --  24.2*  PLT 121* 138*  --  134*  APTT  --   --  36  --   INR  --   --  1.29  --      Recent Labs  02/08/15 1132 02/09/15 0300 02/10/15 0420  NA 140 135 138  K 3.9 3.7 3.5  CL  --  108 111  CO2 22 22 23   GLUCOSE 113 145* 136*  BUN 19.1 22 15   CREATININE 0.7 0.69 0.67  CALCIUM 8.8 8.4 8.1*  MG  --   --  2.0  PHOS  --   --  3.5  PROT 6.7 6.3 5.9*  ALBUMIN 2.8* 2.8* 2.6*  AST 47* 53* 52*  ALT 48 44* 47*  ALKPHOS 279* 229* 215*  BILITOT 0.45 0.6 0.7   Estimated Creatinine Clearance: 69.5 mL/min (by C-G formula based on Cr of 0.67).    Recent Labs  02/09/15 2338 02/10/15 0415 02/10/15 0741  GLUCAP 112* 130* 187*    Medical History: Past Medical History  Diagnosis Date  . Chicken pox   . Depression   . Hypertension   . Hyperlipidemia   . Polyp of colon   . Shingles JUNE 2015    RIGHT  SHOULDER  . Anxiety     since diagnosis  . Allergy   . Pancreatic cancer 07/12/14    Adenocarcinoma. Chemo/ radiation completed early 9'15-Dr. Sherril follows  . Hx of radiation therapy 08/10/14-09/21/14    pancreas 50.4Gy/24fx  . Asthma     exercise induced, seasonal   . GERD (gastroesophageal reflux disease)     uses  TUM  PRN, since chemo-   . Arthritis     mild, hips   . Status post PICC central line placement     10/2014   Insulin Requirements:  2 units Novolog SSI sensitive scale  Current Nutrition:  Regular diet ordered TPN from home  IVF: none  Central access: PICC TPN start date: PTA  ASSESSMENT                                                                                                          HPI: 64yo F admitted 3/11 with fever. She is getting Gemzar q7d for pancreatic cancer. Received  cycle 6 the day before admission. She is on TPN at home and pharmacy is asked to continue while inpatient. The patient asked if we can use the TPN bags already mixed by [facility]. Pharmacist on admission informed her of our policy not to do this but she insisted. Family has brought it in and being stored in pharmacy. We will use it as long as it is appropriate and safe for her current situation.    Significant events: IV RN called @ 4am to state that tpn running @ 46ml/hr from 6p-4a (not inc). RN increased rate now and will adjust end time for today.  Today, 02/10/2015:   Glucose - once value above  Goal of <150.  Electrolytes - all wnl including  CorrCa 9.2  Renal - SCr wnl  LFTs - AST/ALT and Alk Phos slightly elevated.   TGs - pending  Prealbumin - pending  NUTRITIONAL GOALS                                                                                             RD recs: Kcal: 1850-2000, Protein: 117-125 grams [facility] 3-in-1 formula: 1561ml delivered over 12 hours provides 1200Kcal and 80g protein. This is not meeting above recommendations. If patient's hospitalization is prolonged, will need to address changing to our Clinimix formulation to provide closer to goal needs if patient does not consume adequate diet.  PLAN                                                                                                                         At 1800 today:  Continue [facility] TPN cyclic infusion: 85YI/FO  from 6p-7p; 12ml/hr from 7p-5a; 48ml/hr from 5a-6a; off 6a-6p.  TPN to contain standard multivitamins and trace elements.  Continue SSI sensitive with CBGs at 8p and 4a.   TPN lab panels on Mondays & Thursdays.  F/u daily.  Peggyann Juba, PharmD, BCPS Pager: [phone] 02/10/2015,8:53 AM.

## 2015-02-10 NOTE — Clinical Documentation Improvement (Signed)
Presents with mild sepsis, pneumonia, ovarian cancer, severe protein calorie malnutrition, thrombocytopenia.   CBC reveals: leukopenia (2.3), anemia (7.7/24 ), thrombocytopenia (134 - 138 )  Patient is receiving chemotherapy  Please provide a diagnosis associated with the above clinical indicators/treatment provided and document findings in next progress note and include in discharge summary if applicable.  Thank You, Zoila Shutter ,RN Clinical Documentation Specialist:  [facility] Information Management

## 2015-02-10 NOTE — Progress Notes (Signed)
Patient's temperature was 103.71F (oral). Medication was given. PCP was notified.

## 2015-02-10 NOTE — Progress Notes (Signed)
[facility] PROGRESS NOTE  Sonya Larson QBH:[id number] DOB: 10/23/1951 DOA: 02/08/2015 PCP: Eulas Post, MD  Brief Summary  The patient is a 64 year old female with history of hypertension, hyperlipidemia, GERD, asthma, pancreatic cancer status post Whipple and radiation currently on chemotherapy, who presented with fever. Her last dose of chemotherapy was one day prior to presentation. She developed fever on the same day with some mild abdominal comfort but no cough, shortness of breath, nausea, vomiting, diarrhea, dysuria. In the emergency department, she had a fever to 100.8 Fahrenheit, with blood cell count 4.4. Chest x-ray demonstrated left lower lobe pneumonia with a moderate left pleural effusion. Urinalysis was negative.  Assessment/Plan  Sepsis due to HCAP as evidenced by chest x-ray. There is no signs of infection around her PICC line. Patient is mildly septic with tachycardia and fever. She is hemodynamically stable. - Tele:  NSR.  Tele d/c'd on 3/11 - Continue IV Vancomycin and cefepime day 2 - Urine legionella pending and S. pneumococcal antigen neg - flu negative - resp viral panel pending - bcx NGTD - sputum not obtained - procalciton - mildly elevated - lactic acid neg - Zofran for Nausea  - US-guided thoracentesis, LEFT:  Not a significant amount of fluid, deferred at this time  -  If continuing to spike fevers OR if bacteremic, consider PICC line infection  HTN: -hold HCTZ -continue lisinopril  Asthma: stable. No wheezing on auscultation -Continue Symbicort  Hyperlipidemia: -Continue Lipitor  Pancreatic cancer: She is post status of Whipple procedure and radiation therapy. Patient is followed up by Dr. Benay Spice. She is currently treated with chemotherapy. Last dose was yesterday. - Added Dr. Benay Spice to rounding team  Mild elevation of alk-phos and LFTs likely related to whipple -  Trend CMP  Anemia of chronic disease, hgb baseline  9.3-9.6 -  Hemoglobin trending down, likely due to dehydration  -  Repeat in AM and transfuse to keep hgb > 7   Thrombocytopenia, mild and asymptomatic, stable around 134 -  Repeat in AM  Severe protein-calorie malnutrition -  Continue TPN -  Regular diet -  Supplements -  Nutrition consultation  Diet:  regular Access:  Port/PICC  IVF:  off Proph:  lovenox (post-thoracentesis)  Code Status: full Family Communication: patient and sister Disposition Plan: likely for a few more days to follow up blood cultures    Consultants:  Radiology  Oncology  Procedures:  CXR  Antibiotics:  vanc 3/11 >>  Cefepime 3/11 >>  HPI/Subjective:  Coughing, rigors have improved.  Had some SOB when reclining but not SOB with ambulation.    Objective: Filed Vitals:   02/09/15 2341 02/10/15 0419 02/10/15 1132 02/10/15 1410  BP: 129/65 134/72 130/76 134/71  Pulse: 76 80  99  Temp: 98.3 F (36.8 C) 98.3 F (36.8 C)  100.2 F (37.9 C)  TempSrc: Oral Oral  Oral  Resp: _0 Height:      Weight:      SpO2: 97% 96%  98%    Intake/Output Summary (Last 24 hours) at 02/10/15 1535 Last data filed at 02/10/15 1300  Gross per 24 hour  Intake   1160 ml  Output      0 ml  Net   1160 ml   Filed Weights   02/09/15 0973  Weight: 78.3 kg (172 lb 9.9 oz)    Exam:   General:  Adult F, No acute distress  HEENT:  NCAT, MMM  Cardiovascular:  RRR, nl S1,  S2 no mrg, 2+ pulses, warm extremities  Respiratory:  Diminished at left base to the left mid-back, no focal rales or rhonchi, no increased WOB  Abdomen:   NABS, soft, NT/ND  MSK:   Normal tone and bulk, no LEE  Neuro:  Grossly intact  Data Reviewed: Basic Metabolic Panel:  Recent Labs Lab 02/08/15 1132 02/09/15 0300 02/10/15 0420  NA 140 135 138  K 3.9 3.7 3.5  CL  --  108 111  CO2 _0 GLUCOSE 113 145* 136*  BUN 19._1 CREATININE 0.7 0.69 0.67  CALCIUM 8.8 8.4 8.1*  MG  --   --  2.0  PHOS  --    --  3.5   Liver Function Tests:  Recent Labs Lab 02/08/15 1132 02/09/15 0300 02/10/15 0420  AST 47* 53* 52*  ALT 48 44* 47*  ALKPHOS 279* 229* 215*  BILITOT 0.45 0.6 0.7  PROT 6.7 6.3 5.9*  ALBUMIN 2.8* 2.8* 2.6*    Recent Labs Lab 02/09/15 0300  LIPASE 10*   No results for input(s): AMMONIA in the last 168 hours. CBC:  Recent Labs Lab 02/08/15 1130 02/09/15 0300 02/10/15 0420  WBC 2.4* 4.4 2.3*  NEUTROABS 1.7 3.7 1.9  HGB 9.3* 8.5* 7.7*  HCT 28.8* 26.9* 24.2*  MCV 87.3 89.4 88.3  PLT 121* 138* 134*   Cardiac Enzymes: No results for input(s): CKTOTAL, CKMB, CKMBINDEX, TROPONINI in the last 168 hours. BNP (last 3 results) No results for input(s): BNP in the last 8760 hours.  ProBNP (last 3 results) No results for input(s): PROBNP in the last 8760 hours.  CBG:  Recent Labs Lab 02/09/15 2026 02/09/15 2338 02/10/15 0415 02/10/15 0741  GLUCAP 136* 112* 130* 187*    Recent Results (from the past 240 hour(s))  TECHNOLOGIST REVIEW     Status: None   Collection Time: 02/08/15 11:30 AM  Result Value Ref Range Status   Technologist Review Metas and Myelocytes present  Final  Urine culture     Status: None   Collection Time: 02/09/15  2:46 AM  Result Value Ref Range Status   Specimen Description URINE, CLEAN CATCH  Final   Special Requests NONE  Final   Colony Count NO GROWTH Performed at [facility]   Final   Culture NO GROWTH Performed at [facility]   Final   Report Status 02/10/2015 FINAL  Final  Blood culture (routine x 2)     Status: None (Preliminary result)   Collection Time: 02/09/15  3:00 AM  Result Value Ref Range Status   Specimen Description BLOOD L WRIST  Final   Special Requests BOTTLES DRAWN AEROBIC AND ANAEROBIC 5CC  Final   Culture   Final           BLOOD CULTURE RECEIVED NO GROWTH TO DATE CULTURE WILL BE HELD FOR 5 DAYS BEFORE ISSUING A FINAL NEGATIVE REPORT Performed at [facility]    Report Status  PENDING  Incomplete  Blood culture (routine x 2)     Status: None (Preliminary result)   Collection Time: 02/09/15  6:00 AM  Result Value Ref Range Status   Specimen Description BLOOD LEFT HAND  Final   Special Requests BOTTLES DRAWN AEROBIC AND ANAEROBIC 5ML  Final   Culture   Final           BLOOD CULTURE RECEIVED NO GROWTH TO DATE CULTURE WILL BE HELD FOR 5 DAYS BEFORE ISSUING A FINAL NEGATIVE REPORT  Performed at [facility]    Report Status PENDING  Incomplete     Studies: Dg Chest 2 View  02/09/2015   CLINICAL DATA:  Fever. History of pancreatic cancer, currently receiving chemotherapy.  EXAM: CHEST  2 VIEW  COMPARISON:  Most recent chest radiograph 12/13/2014.  FINDINGS: Tip of the right upper extremity PICC in the mid SVC. Tip of the left chest port in the mid SVC. Development of left basilar airspace disease and left pleural effusion. The right lung is clear. Cardiomediastinal contours are normal. No pneumothorax.  IMPRESSION: Left lower lobe airspace disease and moderate left pleural effusion, may reflect pneumonia with parapneumonic effusion given history of fever.   Electronically Signed   By: Jeb Levering M.D.   On: 02/09/2015 05:07   [place] Chest  02/09/2015   CLINICAL DATA:  Pancreatic cancer, left pleural effusion. Request is made for ultrasound-guided left thoracentesis  EXAM: CHEST ULTRASOUND  COMPARISON:  None.  FINDINGS: Limited ultrasound of posterior left chest reveals only a small effusion.  IMPRESSION: Limited ultrasound posterior left chest revealing small effusion. Case was discussed with ordering MD and decision made to postpone thoracentesis today.  Read by: Rowe Robert, PA-C   Electronically Signed   By: Lucrezia Europe M.D.   On: 02/09/2015 15:51    Scheduled Meds: . atorvastatin  10 mg Oral Daily  . ceFEPime (MAXIPIME) IV  1 g Intravenous 3 times per day  . enoxaparin (LOVENOX) injection  40 mg Subcutaneous Q24H  . insulin aspart  0-9 Units Subcutaneous 3  times per day  . lisinopril  40 mg Oral Daily  . loratadine  10 mg Oral Daily  . metoCLOPramide  10 mg Oral TID  . NONFORMULARY OR COMPOUNDED ITEM 1,500 mL/day  1,500 mL/day Intravenous Q24H  . sodium chloride  10-40 mL Intracatheter Q12H  . vancomycin  1,000 mg Intravenous Q12H   Continuous Infusions: . sodium chloride      Principal Problem:   HCAP (healthcare-associated pneumonia) Active Problems:   Osteoarthritis   History of depression   Essential hypertension, benign   Hyperlipidemia   Adenocarcinoma of uncinate process of pancreas   Asthma   Sepsis    Time spent: 30 min    Marcelle Bebout, [facility] Pager [phone]. If 7PM-7AM, please contact night-coverage at [email or web address], password [facility] 02/10/2015, 3:35 PM  LOS: 1 day

## 2015-02-10 NOTE — Progress Notes (Signed)
Pt and husband are refusing transfusion of 1 unit of PRBC's . She would like to see what her lab work shows tomorrow.  MD notified and is aware. Risk and benefits were explained to patient and her husband. Cont with plan of care

## 2015-02-11 ENCOUNTER — Inpatient Hospital Stay (HOSPITAL_COMMUNITY): Payer: Medicare Other

## 2015-02-11 DIAGNOSIS — R509 Fever, unspecified: Secondary | ICD-10-CM

## 2015-02-11 LAB — GLUCOSE, CAPILLARY
GLUCOSE-CAPILLARY: 154 mg/dL — AB (ref 70–99)
GLUCOSE-CAPILLARY: 86 mg/dL (ref 70–99)
Glucose-Capillary: 116 mg/dL — ABNORMAL HIGH (ref 70–99)

## 2015-02-11 LAB — COMPREHENSIVE METABOLIC PANEL
ALT: 34 U/L (ref 0–35)
AST: 34 U/L (ref 0–37)
Albumin: 2.2 g/dL — ABNORMAL LOW (ref 3.5–5.2)
Alkaline Phosphatase: 178 U/L — ABNORMAL HIGH (ref 39–117)
Anion gap: 5 (ref 5–15)
BILIRUBIN TOTAL: 0.5 mg/dL (ref 0.3–1.2)
BUN: 20 mg/dL (ref 6–23)
CALCIUM: 8.1 mg/dL — AB (ref 8.4–10.5)
CO2: 22 mmol/L (ref 19–32)
CREATININE: 0.57 mg/dL (ref 0.50–1.10)
Chloride: 112 mmol/L (ref 96–112)
GFR calc Af Amer: >90 mL/min (ref >90)
Glucose, Bld: 136 mg/dL — ABNORMAL HIGH (ref 70–99)
Potassium: 3.6 mmol/L (ref 3.5–5.1)
Sodium: 139 mmol/L (ref 135–145)
TOTAL PROTEIN: 5.5 g/dL — AB (ref 6.0–8.3)

## 2015-02-11 LAB — CBC
HCT: 22.2 % — ABNORMAL LOW (ref 36.0–46.0)
Hemoglobin: 7 g/dL — ABNORMAL LOW (ref 12.0–15.0)
MCH: 27.9 pg (ref 26.0–34.0)
MCHC: 31.5 g/dL (ref 30.0–36.0)
MCV: 88.4 fL (ref 78.0–100.0)
Platelets: 156 10*3/uL (ref 150–400)
RBC: 2.51 MIL/uL — AB (ref 3.87–5.11)
RDW: 19 % — AB (ref 11.5–15.5)
WBC: 4.3 10*3/uL (ref 4.0–10.5)

## 2015-02-11 LAB — PREPARE RBC (CROSSMATCH)

## 2015-02-11 LAB — VANCOMYCIN, TROUGH: Vancomycin Tr: 14.2 ug/mL (ref 10.0–20.0)

## 2015-02-11 LAB — ABO/RH: ABO/RH(D): O POS

## 2015-02-11 MED ORDER — SODIUM CHLORIDE 0.9 % IV SOLN
Freq: Once | INTRAVENOUS | Status: AC
  Administered 2015-02-11: 17:00:00 via INTRAVENOUS

## 2015-02-11 MED ORDER — DEXTROSE 5 % IV SOLN
2.0000 g | Freq: Three times a day (TID) | INTRAVENOUS | Status: DC
  Administered 2015-02-11 – 2015-02-13 (×6): 2 g via INTRAVENOUS
  Filled 2015-02-11 (×8): qty 2

## 2015-02-11 NOTE — Progress Notes (Signed)
Brief Pharmacy note: Vancomycin trough  Vanc trough 14.2 mcg/ml on 1gm q12 Goal range 15-20 mcg/ml  Continue same dose Vancomycin, anticipate some accumulation  Thank you,  Minda Ditto PharmD Pager [phone] 02/11/2015, 6:36 PM

## 2015-02-11 NOTE — Progress Notes (Signed)
ANTIBIOTIC CONSULT NOTE - FOLLOW UP  Pharmacy Consult for Vancomycin and Cefepime Indication: HCAP, sepsis  No Known Allergies  Patient Measurements: Height: 5\' 3"  (160 cm) Weight: 172 lb 9.9 oz (78.3 kg) IBW/kg (Calculated) : 52.4  Vital Signs: Temp: 98.2 F (36.8 C) (03/13 0520) Temp Source: Oral (03/13 0520) BP: 102/62 mmHg (03/13 0520) Pulse Rate: 76 (03/13 0520) Intake/Output from previous day: 03/12 0701 - 03/13 0700 In: 1060 [P.O.:960; IV Piggyback:100] Out: -  Intake/Output from this shift:    Labs:  Recent Labs  02/09/15 0300 02/10/15 0420 02/11/15 0515  WBC 4.4 2.3* 4.3  HGB 8.5* 7.7* 7.0*  PLT 138* 134* 156  CREATININE 0.69 0.67 0.57   Estimated Creatinine Clearance: 69.5 mL/min (by C-G formula based on Cr of 0.57). No results for input(s): VANCOTROUGH, VANCOPEAK, VANCORANDOM, GENTTROUGH, GENTPEAK, GENTRANDOM, TOBRATROUGH, TOBRAPEAK, TOBRARND, AMIKACINPEAK, AMIKACINTROU, AMIKACIN in the last 72 hours.   Microbiology: Recent Results (from the past 720 hour(s))  TECHNOLOGIST REVIEW     Status: None   Collection Time: 02/08/15 11:30 AM  Result Value Ref Range Status   Technologist Review Metas and Myelocytes present  Final  Urine culture     Status: None   Collection Time: 02/09/15  2:46 AM  Result Value Ref Range Status   Specimen Description URINE, CLEAN CATCH  Final   Special Requests NONE  Final   Colony Count NO GROWTH Performed at [facility]   Final   Culture NO GROWTH Performed at [facility]   Final   Report Status 02/10/2015 FINAL  Final  Blood culture (routine x 2)     Status: None (Preliminary result)   Collection Time: 02/09/15  3:00 AM  Result Value Ref Range Status   Specimen Description BLOOD L WRIST  Final   Special Requests BOTTLES DRAWN AEROBIC AND ANAEROBIC 5CC  Final   Culture   Final           BLOOD CULTURE RECEIVED NO GROWTH TO DATE CULTURE WILL BE HELD FOR 5 DAYS BEFORE ISSUING A FINAL NEGATIVE  REPORT Performed at [facility]    Report Status PENDING  Incomplete  Blood culture (routine x 2)     Status: None (Preliminary result)   Collection Time: 02/09/15  6:00 AM  Result Value Ref Range Status   Specimen Description BLOOD LEFT HAND  Final   Special Requests BOTTLES DRAWN AEROBIC AND ANAEROBIC 5ML  Final   Culture   Final           BLOOD CULTURE RECEIVED NO GROWTH TO DATE CULTURE WILL BE HELD FOR 5 DAYS BEFORE ISSUING A FINAL NEGATIVE REPORT Performed at [facility]    Report Status PENDING  Incomplete    Anti-infectives    Start     Dose/Rate Route Frequency Ordered Stop   02/09/15 1400  piperacillin-tazobactam (ZOSYN) IVPB 3.375 g  Status:  Discontinued     3.375 g 12.5 mL/hr over 240 Minutes Intravenous Every 8 hours 02/09/15 0548 02/09/15 0604   02/09/15 0630  ceFEPIme (MAXIPIME) 1 g in dextrose 5 % 50 mL IVPB     1 g 100 mL/hr over 30 Minutes Intravenous 3 times per day 02/09/15 0604 02/17/15 0559   02/09/15 0600  piperacillin-tazobactam (ZOSYN) IVPB 3.375 g  Status:  Discontinued     3.375 g 100 mL/hr over 30 Minutes Intravenous  Once 02/09/15 0546 02/09/15 0604   02/09/15 0600  vancomycin (VANCOCIN) IVPB 1000 mg/200 mL premix  1,000 mg 200 mL/hr over 60 Minutes Intravenous Every 12 hours 02/09/15 0547        Assessment: 25 yoF with past medical history of hypertension, hyperlipidemia, GERD, asthma, pancreatic cancer, currently on chemotherapy, who presents with fever. Patient was diagnosed as pancreatic cancer. She is post status of Whipple procedure and radiation therapy. She is currently treated with chemotherapy. The last dose of chemotherapy was 3.10. Cefepime and Vancomycin per Rx for PNA.  3/11 >>Cefepime >> 3/11 >>Vancomycin >>   Today, 02/11/2015, Day #3 Vanc/Cefepime Tmax: spike 103.1 WBCs: 4.3 (ANC 1.9 yday when WBC 2.3) Renal: 0.57, CrCl 69 CG, 79 N (using 0.8)  Current orders to pull PICC, hold TPN, CXR, repeat blood  cultures, consult ID   3/11 blood: ngtd 3/11 RSV: sent 3/11 influenza: neg 3/11 urine: NGF 3/11 sputum: ordered 3/13 blood:   Drug level / dose changes info: 3/13 VT at 17:30 = ____ before 6th dose of 1g q12h  Goal of Therapy:  Vancomycin trough level 15-20 mcg/ml  Cefepime dose appropriate for indication, renal function  Plan:   Continue vancomycin 1g IV q12h - check steady state trough tonight before 6th dose  Increase cefepime to 2g IV q8h Follow up renal function & cultures, clinical course Follow up ID recommendations, possible need for antifungal  Peggyann Juba, PharmD, BCPS Pager: [phone] 02/11/2015,9:05 AM

## 2015-02-11 NOTE — Consult Note (Signed)
[facility]  Date of Admission:  02/08/2015  Date of Consult:  02/11/2015  Reason for Consult: fever , HCAP Referring Physician: Short  Impression/Recommendation Fever HCAP? Pancreatic Ca (last CTX 3-10)  Non-neutropenic Would Continue her current abx If further fever consider CT abd/pelvis, chest Consider LE doppler  Comment- Fever within 24 h of starting anbx for pneumonia is not unexpected.  Will follow with you.   Thank you so much for this interesting consult,   Johny Sax (pager) [phone] [email or web address]  Sonya Larson is an 64 y.o. female.  HPI: 64 yo F with hx of T3N1 pancreatic ca (dx 07-2014) who has been receiving CTX and XRT. Whipple 10-2014. She received her 6th cycle of gemcitabine on 3-10.   By 3-11 she developed fever at home and came to ED on 3-11 where she was found to have temp 100.8, WBC 4.4 and CXR showing LLL airspace disease and effusion. She was started on vanco/cefepime. On 3-11 she had u/s of her chest but her thoracentesis was delayed, showed only a small amt of fluid (not done yet).  On 3-12 pm she had temp 103.1. Her CXR was read as unchanged.  By this AM she was noted to have improved her coughing and rigors.  PIC line removed this AM.  She is not neutropenic.   Past Medical History  Diagnosis Date  . Chicken pox   . Depression   . Hypertension   . Hyperlipidemia   . Polyp of colon   . Shingles JUNE 2015    RIGHT  SHOULDER  . Anxiety     since diagnosis  . Allergy   . Pancreatic cancer 07/12/14    Adenocarcinoma. Chemo/ radiation completed early 9'15-Dr. Sherril follows  . Hx of radiation therapy 08/10/14-09/21/14    pancreas 50.4Gy/39fx  . Asthma     exercise induced, seasonal   . GERD (gastroesophageal reflux disease)     uses TUM  PRN, since chemo-   . Arthritis     mild, hips   . Status post PICC central line placement     10/2014    Past Surgical History  Procedure Laterality  Date  . Breast reduction surgery Bilateral   . Colonscopy   LAST DONE SEPT 2014    X 3  . Hysteroscopy  2005    "heavy menopause"  . Eus N/A 07/12/2014    Procedure: ESOPHAGEAL ENDOSCOPIC ULTRASOUND (EUS) RADIAL;  Surgeon: Willis Modena, MD;  Location: [facility];  Service: Endoscopy;  Laterality: N/A;  . Fine needle aspiration N/A 07/12/2014    Procedure: FINE NEEDLE ASPIRATION (FNA) RADIAL;  Surgeon: Willis Modena, MD;  Location: [facility];  Service: Endoscopy;  Laterality: N/A;  . Laparoscopy N/A 07/24/2014    Procedure: LAPAROSCOPY DIAGNOSTIC;  Surgeon: Almond Lint, MD;  Location: [facility];  Service: General;  Laterality: N/A;-"no unusual fingings"  . Eus N/A 10/25/2014    Procedure: ESOPHAGEAL ENDOSCOPIC ULTRASOUND (EUS) RADIAL;  Surgeon: Willis Modena, MD;  Location: [facility];  Service: Endoscopy;  Laterality: N/A;  . Fine needle aspiration N/A 10/25/2014    Procedure: FINE NEEDLE ASPIRATION (FNA) LINEAR;  Surgeon: Willis Modena, MD;  Location: [facility];  Service: Endoscopy;  Laterality: N/A;  . Laparoscopy N/A 11/02/2014    Procedure: LAPAROSCOPY DIAGNOSTIC;  Surgeon: Almond Lint, MD;  Location: [facility];  Service: General;  Laterality: N/A;  . Whipple procedure N/A 11/02/2014    Procedure: WHIPPLE PROCEDURE;  Surgeon: Almond Lint, MD;  Location:  [facility];  Service: General;  Laterality: N/A;  . Portacath placement Left 12/13/2014    Procedure: INSERTION PORT-A-CATH;  Surgeon: Stark Klein, MD;  Location: [facility];  Service: General;  Laterality: Left;     No Known Allergies  Medications:  Scheduled: . sodium chloride   Intravenous Once  . atorvastatin  10 mg Oral Daily  . ceFEPime (MAXIPIME) IV  2 g Intravenous 3 times per day  . enoxaparin (LOVENOX) injection  40 mg Subcutaneous Q24H  . insulin aspart  0-9 Units Subcutaneous 3 times per day  . lisinopril  40 mg Oral Daily  . loratadine  10 mg Oral Daily  . metoCLOPramide  10 mg Oral TID  . sodium  chloride  10-40 mL Intracatheter Q12H  . vancomycin  1,000 mg Intravenous Q12H    Abtx:  Anti-infectives    Start     Dose/Rate Route Frequency Ordered Stop   02/11/15 1400  ceFEPIme (MAXIPIME) 2 g in dextrose 5 % 50 mL IVPB     2 g 100 mL/hr over 30 Minutes Intravenous 3 times per day 02/11/15 0911 02/17/15 0559   02/09/15 1400  piperacillin-tazobactam (ZOSYN) IVPB 3.375 g  Status:  Discontinued     3.375 g 12.5 mL/hr over 240 Minutes Intravenous Every 8 hours 02/09/15 0548 02/09/15 0604   02/09/15 0630  ceFEPIme (MAXIPIME) 1 g in dextrose 5 % 50 mL IVPB  Status:  Discontinued     1 g 100 mL/hr over 30 Minutes Intravenous 3 times per day 02/09/15 0604 02/11/15 0911   02/09/15 0600  piperacillin-tazobactam (ZOSYN) IVPB 3.375 g  Status:  Discontinued     3.375 g 100 mL/hr over 30 Minutes Intravenous  Once 02/09/15 0546 02/09/15 0604   02/09/15 0600  vancomycin (VANCOCIN) IVPB 1000 mg/200 mL premix     1,000 mg 200 mL/hr over 60 Minutes Intravenous Every 12 hours 02/09/15 0547        Total days of antibiotics 2 vanco/cefepime          Social History:  reports that she has never smoked. She has never used smokeless tobacco. She reports that she drinks alcohol. She reports that she does not use illicit drugs.  Family History  Problem Relation Age of Onset  . Arthritis Mother   . Hypertension Mother   . Alcohol abuse Father   . Cancer Father     brain tumor  . Cancer Cousin     breast ca,     General ROS: no oral ulcers, no problems with port, no sob, no cough, normal bm, normal urination, no leg pain. see hpi.   Blood pressure 125/69, pulse 79, temperature 98.2 F (36.8 C), temperature source Oral, resp. rate 18, height $RemoveBe'5\' 3"'ccSOOvxMs$  (1.6 m), weight 78.3 kg (172 lb 9.9 oz), last menstrual period 10/31/2005, SpO2 100 %. General appearance: alert, cooperative and no distress Eyes: negative findings: conjunctivae and sclerae normal and pupils equal, round, reactive to light and  accomodation Throat: normal findings: oropharynx pink & moist without lesions or evidence of thrush Neck: no adenopathy and supple, symmetrical, trachea midline Lungs: clear to auscultation bilaterally Heart: regular rate and rhythm Abdomen: normal findings: bowel sounds normal and soft, non-tender Extremities: edema none and no le cordis.  port is clean, no fluctuance, non-tender   Results for orders placed or performed during the hospital encounter of 02/08/15 (from the past 48 hour(s))  Glucose, capillary     Status: Abnormal   Collection Time: 02/09/15  8:26 PM  Result Value Ref Range   Glucose-Capillary 136 (H) 70 - 99 mg/dL  Glucose, capillary     Status: Abnormal   Collection Time: 02/09/15 11:38 PM  Result Value Ref Range   Glucose-Capillary 112 (H) 70 - 99 mg/dL  Glucose, capillary     Status: Abnormal   Collection Time: 02/10/15  4:15 AM  Result Value Ref Range   Glucose-Capillary 130 (H) 70 - 99 mg/dL  Comprehensive metabolic panel     Status: Abnormal   Collection Time: 02/10/15  4:20 AM  Result Value Ref Range   Sodium 138 135 - 145 mmol/L   Potassium 3.5 3.5 - 5.1 mmol/L   Chloride 111 96 - 112 mmol/L   CO2 23 19 - 32 mmol/L   Glucose, Bld 136 (H) 70 - 99 mg/dL   BUN 15 6 - 23 mg/dL   Creatinine, Ser 8.41 0.50 - 1.10 mg/dL   Calcium 8.1 (L) 8.4 - 10.5 mg/dL   Total Protein 5.9 (L) 6.0 - 8.3 g/dL   Albumin 2.6 (L) 3.5 - 5.2 g/dL   AST 52 (H) 0 - 37 U/L   ALT 47 (H) 0 - 35 U/L   Alkaline Phosphatase 215 (H) 39 - 117 U/L   Total Bilirubin 0.7 0.3 - 1.2 mg/dL   GFR calc non Af Amer >90 >90 mL/min   GFR calc Af Amer >90 >90 mL/min    Comment: (NOTE) The eGFR has been calculated using the CKD EPI equation. This calculation has not been validated in all clinical situations. eGFR's persistently <90 mL/min signify possible Chronic Kidney Disease.    Anion gap 4 (L) 5 - 15  CBC     Status: Abnormal   Collection Time: 02/10/15  4:20 AM  Result Value Ref Range    WBC 2.3 (L) 4.0 - 10.5 K/uL   RBC 2.74 (L) 3.87 - 5.11 MIL/uL   Hemoglobin 7.7 (L) 12.0 - 15.0 g/dL   HCT 67.0 (L) 58.9 - 10.4 %   MCV 88.3 78.0 - 100.0 fL   MCH 28.1 26.0 - 34.0 pg   MCHC 31.8 30.0 - 36.0 g/dL   RDW 74.0 (H) 45.7 - 63.5 %   Platelets 134 (L) 150 - 400 K/uL  Prealbumin     Status: Abnormal   Collection Time: 02/10/15  4:20 AM  Result Value Ref Range   Prealbumin 11.7 (L) 17.0 - 34.0 mg/dL    Comment: Performed at [facility]  Magnesium     Status: None   Collection Time: 02/10/15  4:20 AM  Result Value Ref Range   Magnesium 2.0 1.5 - 2.5 mg/dL  Phosphorus     Status: None   Collection Time: 02/10/15  4:20 AM  Result Value Ref Range   Phosphorus 3.5 2.3 - 4.6 mg/dL  Differential     Status: Abnormal   Collection Time: 02/10/15  4:20 AM  Result Value Ref Range   Neutrophils Relative % 85 (H) 43 - 77 %   Neutro Abs 1.9 1.7 - 7.7 K/uL   Lymphocytes Relative 8 (L) 12 - 46 %   Lymphs Abs 0.2 (L) 0.7 - 4.0 K/uL   Monocytes Relative 4 3 - 12 %   Monocytes Absolute 0.1 0.1 - 1.0 K/uL   Eosinophils Relative 3 0 - 5 %   Eosinophils Absolute 0.1 0.0 - 0.7 K/uL   Basophils Relative 0 0 - 1 %   Basophils Absolute 0.0 0.0 - 0.1 K/uL  Triglycerides     Status: None   Collection Time: 02/10/15  5:00 AM  Result Value Ref Range   Triglycerides 78 <150 mg/dL    Comment: Performed at [facility]  Glucose, capillary     Status: Abnormal   Collection Time: 02/10/15  7:41 AM  Result Value Ref Range   Glucose-Capillary 187 (H) 70 - 99 mg/dL  Glucose, capillary     Status: Abnormal   Collection Time: 02/10/15  5:03 PM  Result Value Ref Range   Glucose-Capillary 103 (H) 70 - 99 mg/dL  Glucose, capillary     Status: Abnormal   Collection Time: 02/11/15 12:12 AM  Result Value Ref Range   Glucose-Capillary 154 (H) 70 - 99 mg/dL   Comment 1 Notify RN    Comment 2 Document in Chart   Comprehensive metabolic panel     Status: Abnormal   Collection Time:  02/11/15  5:15 AM  Result Value Ref Range   Sodium 139 135 - 145 mmol/L   Potassium 3.6 3.5 - 5.1 mmol/L   Chloride 112 96 - 112 mmol/L   CO2 22 19 - 32 mmol/L   Glucose, Bld 136 (H) 70 - 99 mg/dL   BUN 20 6 - 23 mg/dL   Creatinine, Ser 0.57 0.50 - 1.10 mg/dL   Calcium 8.1 (L) 8.4 - 10.5 mg/dL   Total Protein 5.5 (L) 6.0 - 8.3 g/dL   Albumin 2.2 (L) 3.5 - 5.2 g/dL   AST 34 0 - 37 U/L   ALT 34 0 - 35 U/L   Alkaline Phosphatase 178 (H) 39 - 117 U/L   Total Bilirubin 0.5 0.3 - 1.2 mg/dL   GFR calc non Af Amer >90 >90 mL/min   GFR calc Af Amer >90 >90 mL/min    Comment: (NOTE) The eGFR has been calculated using the CKD EPI equation. This calculation has not been validated in all clinical situations. eGFR's persistently <90 mL/min signify possible Chronic Kidney Disease.    Anion gap 5 5 - 15  CBC     Status: Abnormal   Collection Time: 02/11/15  5:15 AM  Result Value Ref Range   WBC 4.3 4.0 - 10.5 K/uL   RBC 2.51 (L) 3.87 - 5.11 MIL/uL   Hemoglobin 7.0 (L) 12.0 - 15.0 g/dL   HCT 22.2 (L) 36.0 - 46.0 %   MCV 88.4 78.0 - 100.0 fL   MCH 27.9 26.0 - 34.0 pg   MCHC 31.5 30.0 - 36.0 g/dL   RDW 19.0 (H) 11.5 - 15.5 %   Platelets 156 150 - 400 K/uL  Type and screen     Status: None (Preliminary result)   Collection Time: 02/11/15  6:15 AM  Result Value Ref Range   ABO/RH(D) O POS    Antibody Screen NEG    Sample Expiration 02/14/2015    Unit Number [id number]    Blood Component Type RED CELLS,LR    Unit division 00    Status of Unit ALLOCATED    Transfusion Status OK TO TRANSFUSE    Crossmatch Result Compatible    Unit Number [id number]    Blood Component Type RED CELLS,LR    Unit division 00    Status of Unit ALLOCATED    Transfusion Status OK TO TRANSFUSE    Crossmatch Result Compatible   Glucose, capillary     Status: Abnormal   Collection Time: 02/11/15  7:50 AM  Result Value Ref Range   Glucose-Capillary 116 (H) 70 -  99 mg/dL  Prepare RBC     Status: None    Collection Time: 02/11/15  8:00 AM  Result Value Ref Range   Order Confirmation ORDER PROCESSED BY BLOOD BANK   ABO/Rh     Status: None   Collection Time: 02/11/15  8:00 AM  Result Value Ref Range   ABO/RH(D) O POS       Component Value Date/Time   SDES BLOOD LEFT HAND 02/09/2015 0600   SPECREQUEST BOTTLES DRAWN AEROBIC AND ANAEROBIC 5ML 02/09/2015 0600   CULT  02/09/2015 0600           BLOOD CULTURE RECEIVED NO GROWTH TO DATE CULTURE WILL BE HELD FOR 5 DAYS BEFORE ISSUING A FINAL NEGATIVE REPORT Performed at [place] PENDING 02/09/2015 0600   Dg Chest 2 View  02/11/2015   CLINICAL DATA:  Fever.  EXAM: CHEST  2 VIEW  COMPARISON:  02/09/2015  FINDINGS: There is a left chest wall port a catheter with tip in the projection of the SVC. There is a right arm PICC line with tip in the projection of the SVC. Normal heart size. Moderate left pleural effusion is identified. This is unchanged from previous exam.  IMPRESSION: No significant change in the appearance of left pleural effusion.   Electronically Signed   By: Kerby Moors M.D.   On: 02/11/2015 07:55   Recent Results (from the past 240 hour(s))  TECHNOLOGIST REVIEW     Status: None   Collection Time: 02/08/15 11:30 AM  Result Value Ref Range Status   Technologist Review Metas and Myelocytes present  Final  Urine culture     Status: None   Collection Time: 02/09/15  2:46 AM  Result Value Ref Range Status   Specimen Description URINE, CLEAN CATCH  Final   Special Requests NONE  Final   Colony Count NO GROWTH Performed at [facility]   Final   Culture NO GROWTH Performed at [facility]   Final   Report Status 02/10/2015 FINAL  Final  Blood culture (routine x 2)     Status: None (Preliminary result)   Collection Time: 02/09/15  3:00 AM  Result Value Ref Range Status   Specimen Description BLOOD L WRIST  Final   Special Requests BOTTLES DRAWN AEROBIC AND ANAEROBIC 5CC  Final   Culture    Final           BLOOD CULTURE RECEIVED NO GROWTH TO DATE CULTURE WILL BE HELD FOR 5 DAYS BEFORE ISSUING A FINAL NEGATIVE REPORT Performed at [facility]    Report Status PENDING  Incomplete  Blood culture (routine x 2)     Status: None (Preliminary result)   Collection Time: 02/09/15  6:00 AM  Result Value Ref Range Status   Specimen Description BLOOD LEFT HAND  Final   Special Requests BOTTLES DRAWN AEROBIC AND ANAEROBIC 5ML  Final   Culture   Final           BLOOD CULTURE RECEIVED NO GROWTH TO DATE CULTURE WILL BE HELD FOR 5 DAYS BEFORE ISSUING A FINAL NEGATIVE REPORT Performed at [facility]    Report Status PENDING  Incomplete      02/11/2015, 3:51 PM     LOS: 2 days

## 2015-02-11 NOTE — Progress Notes (Signed)
[facility] PROGRESS NOTE  Sonya Larson SLH:[id number] DOB: May 23, 1951 DOA: 02/08/2015 PCP: Eulas Post, MD  Brief Summary  The patient is a 64 year old female with history of hypertension, hyperlipidemia, GERD, asthma, pancreatic cancer status post Whipple and radiation currently on chemotherapy, who presented with fever. Her last dose of chemotherapy was one day prior to presentation. She developed fever on the same day with some mild abdominal comfort but no cough, shortness of breath, nausea, vomiting, diarrhea, dysuria. In the emergency department, she had a fever to 100.8 Fahrenheit, with blood cell count 4.4. Chest x-ray demonstrated left lower lobe pneumonia with a moderate left pleural effusion. Urinalysis was negative.  Assessment/Plan  Sepsis due to HCAP as evidenced by chest x-ray. Spiked even higher fever today, although hemodynamically stable - Tele:  NSR.  Tele d/c'd on 3/11 - Continue IV Vancomycin and cefepime day 2 - Urine legionella pending and S. pneumococcal antigen neg - flu negative - resp viral panel pending - bcx NGTD - sputum not obtained - procalciton - mildly elevated - lactic acid neg - Zofran for Nausea  - US-guided thoracentesis, LEFT:  Not a significant amount of fluid, deferred at this time  - d/c PICC line -  ID consult  HTN: -hold HCTZ -continue lisinopril  Asthma: stable. No wheezing on auscultation -Continue Symbicort  Hyperlipidemia: -Continue Lipitor  Pancreatic cancer: She is post status of Whipple procedure and radiation therapy. Patient is followed up by Dr. Benay Spice. She is currently treated with chemotherapy. Last dose was 3/11. - Added Dr. Benay Spice to rounding team  Mild elevation of alk-phos and LFTs likely related to whipple -  Trend CMP  Anemia of chronic disease and chemotherapy, hgb baseline 9.3-9.6 and down to 7 currently -  Occult stool -  Iron studies, b12, folate, TSH -  Transfuse 2 units PRBC -   Repeat in AM and transfuse to keep hgb > 7   Thrombocytopenia, mild and asymptomatic, resolved  Severe protein-calorie malnutrition -  Hold TPN -  Regular diet -  Supplements -  Nutrition consultation  Diet:  regular Access:  Port IVF:  off Proph:  lovenox   Code Status: full Family Communication: patient and sister Disposition Plan: likely for a few more days to follow up blood cultures    Consultants:  Radiology  Oncology  Procedures:  CXR  Antibiotics:  vanc 3/11 >>  Cefepime 3/11 >>  HPI/Subjective:  Had high fever and rigors again.  Generally not feeling well.   Objective: Filed Vitals:   02/10/15 2300 02/11/15 0520 02/11/15 1005 02/11/15 1300  BP:  102/62 124/72 125/69  Pulse:  76  79  Temp: 99.1 F (37.3 C) 98.2 F (36.8 C)  98.2 F (36.8 C)  TempSrc: Oral Oral  Oral  Resp:  18  18  Height:      Weight:      SpO2:  100%  100%    Intake/Output Summary (Last 24 hours) at 02/11/15 1605 Last data filed at 02/11/15 1300  Gross per 24 hour  Intake    700 ml  Output      0 ml  Net    700 ml   Filed Weights   02/09/15 1572  Weight: 78.3 kg (172 lb 9.9 oz)    Exam:   General:  Adult F, No acute distress  HEENT:  NCAT, MMM  Cardiovascular:  RRR, nl S1, S2 no mrg, 2+ pulses, warm extremities  Respiratory:  Diminished at left base to the left  mid-back, no focal rales or rhonchi, no increased WOB  Abdomen:   NABS, soft, NT/ND  MSK:   Normal tone and bulk, no LEE, PICC line and port without surrounding erythema or induration  Neuro:  Grossly intact  Data Reviewed: Basic Metabolic Panel:  Recent Labs Lab 02/08/15 1132 02/09/15 0300 02/10/15 0420 02/11/15 0515  NA 140 135 138 139  K 3.9 3.7 3.5 3.6  CL  --  108 111 112  CO2 $Re'22 22 23 22  'KMg$ GLUCOSE 113 145* 136* 136*  BUN 19.$Remov'1 22 15 20  'uVTOId$ CREATININE 0.7 0.69 0.67 0.57  CALCIUM 8.8 8.4 8.1* 8.1*  MG  --   --  2.0  --   PHOS  --   --  3.5  --    Liver Function Tests:  Recent  Labs Lab 02/08/15 1132 02/09/15 0300 02/10/15 0420 02/11/15 0515  AST 47* 53* 52* 34  ALT 48 44* 47* 34  ALKPHOS 279* 229* 215* 178*  BILITOT 0.45 0.6 0.7 0.5  PROT 6.7 6.3 5.9* 5.5*  ALBUMIN 2.8* 2.8* 2.6* 2.2*    Recent Labs Lab 02/09/15 0300  LIPASE 10*   No results for input(s): AMMONIA in the last 168 hours. CBC:  Recent Labs Lab 02/08/15 1130 02/09/15 0300 02/10/15 0420 02/11/15 0515  WBC 2.4* 4.4 2.3* 4.3  NEUTROABS 1.7 3.7 1.9  --   HGB 9.3* 8.5* 7.7* 7.0*  HCT 28.8* 26.9* 24.2* 22.2*  MCV 87.3 89.4 88.3 88.4  PLT 121* 138* 134* 156   Cardiac Enzymes: No results for input(s): CKTOTAL, CKMB, CKMBINDEX, TROPONINI in the last 168 hours. BNP (last 3 results) No results for input(s): BNP in the last 8760 hours.  ProBNP (last 3 results) No results for input(s): PROBNP in the last 8760 hours.  CBG:  Recent Labs Lab 02/10/15 0415 02/10/15 0741 02/10/15 1703 02/11/15 0012 02/11/15 0750  GLUCAP 130* 187* 103* 154* 116*    Recent Results (from the past 240 hour(s))  TECHNOLOGIST REVIEW     Status: None   Collection Time: 02/08/15 11:30 AM  Result Value Ref Range Status   Technologist Review Metas and Myelocytes present  Final  Urine culture     Status: None   Collection Time: 02/09/15  2:46 AM  Result Value Ref Range Status   Specimen Description URINE, CLEAN CATCH  Final   Special Requests NONE  Final   Colony Count NO GROWTH Performed at [facility]   Final   Culture NO GROWTH Performed at [facility]   Final   Report Status 02/10/2015 FINAL  Final  Blood culture (routine x 2)     Status: None (Preliminary result)   Collection Time: 02/09/15  3:00 AM  Result Value Ref Range Status   Specimen Description BLOOD L WRIST  Final   Special Requests BOTTLES DRAWN AEROBIC AND ANAEROBIC 5CC  Final   Culture   Final           BLOOD CULTURE RECEIVED NO GROWTH TO DATE CULTURE WILL BE HELD FOR 5 DAYS BEFORE ISSUING A FINAL NEGATIVE  REPORT Performed at [facility]    Report Status PENDING  Incomplete  Blood culture (routine x 2)     Status: None (Preliminary result)   Collection Time: 02/09/15  6:00 AM  Result Value Ref Range Status   Specimen Description BLOOD LEFT HAND  Final   Special Requests BOTTLES DRAWN AEROBIC AND ANAEROBIC 5ML  Final   Culture   Final  BLOOD CULTURE RECEIVED NO GROWTH TO DATE CULTURE WILL BE HELD FOR 5 DAYS BEFORE ISSUING A FINAL NEGATIVE REPORT Performed at [facility]    Report Status PENDING  Incomplete     Studies: Dg Chest 2 View  02/11/2015   CLINICAL DATA:  Fever.  EXAM: CHEST  2 VIEW  COMPARISON:  02/09/2015  FINDINGS: There is a left chest wall port a catheter with tip in the projection of the SVC. There is a right arm PICC line with tip in the projection of the SVC. Normal heart size. Moderate left pleural effusion is identified. This is unchanged from previous exam.  IMPRESSION: No significant change in the appearance of left pleural effusion.   Electronically Signed   By: Kerby Moors M.D.   On: 02/11/2015 07:55    Scheduled Meds: . sodium chloride   Intravenous Once  . atorvastatin  10 mg Oral Daily  . ceFEPime (MAXIPIME) IV  2 g Intravenous 3 times per day  . enoxaparin (LOVENOX) injection  40 mg Subcutaneous Q24H  . insulin aspart  0-9 Units Subcutaneous 3 times per day  . lisinopril  40 mg Oral Daily  . loratadine  10 mg Oral Daily  . metoCLOPramide  10 mg Oral TID  . sodium chloride  10-40 mL Intracatheter Q12H  . vancomycin  1,000 mg Intravenous Q12H   Continuous Infusions: . sodium chloride      Principal Problem:   HCAP (healthcare-associated pneumonia) Active Problems:   Osteoarthritis   History of depression   Essential hypertension, benign   Hyperlipidemia   Adenocarcinoma of uncinate process of pancreas   Asthma   Sepsis   Antineoplastic chemotherapy induced pancytopenia    Time spent: 30 min    Stefen Juba,  [facility] Pager [phone]. If 7PM-7AM, please contact night-coverage at [email or web address], password [facility] 02/11/2015, 4:05 PM  LOS: 2 days

## 2015-02-12 LAB — TYPE AND SCREEN
ABO/RH(D): O POS
Antibody Screen: NEGATIVE
UNIT DIVISION: 0
Unit division: 0

## 2015-02-12 LAB — GLUCOSE, CAPILLARY
GLUCOSE-CAPILLARY: 76 mg/dL (ref 70–99)
Glucose-Capillary: 78 mg/dL (ref 70–99)
Glucose-Capillary: 86 mg/dL (ref 70–99)
Glucose-Capillary: 99 mg/dL (ref 70–99)

## 2015-02-12 LAB — CBC
HCT: 27.5 % — ABNORMAL LOW (ref 36.0–46.0)
Hemoglobin: 9 g/dL — ABNORMAL LOW (ref 12.0–15.0)
MCH: 28.6 pg (ref 26.0–34.0)
MCHC: 32.7 g/dL (ref 30.0–36.0)
MCV: 87.3 fL (ref 78.0–100.0)
PLATELETS: 197 10*3/uL (ref 150–400)
RBC: 3.15 MIL/uL — ABNORMAL LOW (ref 3.87–5.11)
RDW: 17.8 % — ABNORMAL HIGH (ref 11.5–15.5)
WBC: 3.5 10*3/uL — AB (ref 4.0–10.5)

## 2015-02-12 LAB — COMPREHENSIVE METABOLIC PANEL
ALBUMIN: 2.6 g/dL — AB (ref 3.5–5.2)
ALK PHOS: 176 U/L — AB (ref 39–117)
ALT: 41 U/L — ABNORMAL HIGH (ref 0–35)
AST: 47 U/L — ABNORMAL HIGH (ref 0–37)
Anion gap: 8 (ref 5–15)
BILIRUBIN TOTAL: 1.2 mg/dL (ref 0.3–1.2)
BUN: 15 mg/dL (ref 6–23)
CHLORIDE: 111 mmol/L (ref 96–112)
CO2: 22 mmol/L (ref 19–32)
Calcium: 8.3 mg/dL — ABNORMAL LOW (ref 8.4–10.5)
Creatinine, Ser: 0.6 mg/dL (ref 0.50–1.10)
GFR calc Af Amer: >90 mL/min (ref >90)
GFR calc non Af Amer: >90 mL/min (ref >90)
Glucose, Bld: 84 mg/dL (ref 70–99)
POTASSIUM: 3.6 mmol/L (ref 3.5–5.1)
Sodium: 141 mmol/L (ref 135–145)
Total Protein: 5.7 g/dL — ABNORMAL LOW (ref 6.0–8.3)

## 2015-02-12 LAB — IRON AND TIBC
Iron: 51 ug/dL (ref 42–145)
SATURATION RATIOS: 32 % (ref 20–55)
TIBC: 159 ug/dL — AB (ref 250–470)
UIBC: 108 ug/dL — ABNORMAL LOW (ref 125–400)

## 2015-02-12 LAB — FERRITIN: Ferritin: 1055 ng/mL — ABNORMAL HIGH (ref 10–291)

## 2015-02-12 LAB — LEGIONELLA ANTIGEN, URINE

## 2015-02-12 LAB — TSH: TSH: 5.899 u[IU]/mL — AB (ref 0.350–4.500)

## 2015-02-12 LAB — VITAMIN B12: Vitamin B-12: 723 pg/mL (ref 211–911)

## 2015-02-12 NOTE — Progress Notes (Addendum)
[facility] PROGRESS NOTE  JURNEI LATINI DJS:[id number] DOB: 07-16-1951 DOA: 02/08/2015 PCP: Eulas Post, MD  Brief Summary  The patient is a 64 year old female with history of hypertension, hyperlipidemia, GERD, asthma, pancreatic cancer status post Whipple and radiation currently on chemotherapy, who presented with fever. Her last dose of chemotherapy was one day prior to presentation. She developed fever on the same day with some mild abdominal comfort but no cough, shortness of breath, nausea, vomiting, diarrhea, dysuria. In the emergency department, she had a fever to 100.8 Fahrenheit, with blood cell count 4.4. Chest x-ray demonstrated left lower lobe pneumonia with a moderate left pleural effusion. Urinalysis was negative.  Assessment/Plan  Sepsis due to HCAP (admitted in January) as evidenced by chest x-ray.  Temperature finally starting to trend down - Tele:  NSR.  Tele d/c'd on 3/11 - Continue IV Vancomycin and cefepime day 3 - await second cultures, but will likely transition to levofloxacin tomorrow if all neg - Urine legionella pending and S. pneumococcal antigen neg - flu negative - bcx NGTD - procalciton - mildly elevated - lactic acid neg - Zofran for Nausea  - US-guided thoracentesis, LEFT:  Not a significant amount of fluid, deferred at this time  - d/c'd PICC line on 3/13 -  ID consult:  Appreciate assistance  HTN: -hold HCTZ -continue lisinopril  Asthma: stable. No wheezing on auscultation -Continue Symbicort  Hyperlipidemia: -Continue Lipitor  Pancreatic cancer: She is post status of Whipple procedure and radiation therapy.   -  She is currently treated with chemotherapy. Last dose was 3/11. - Dr. Benay Spice added to rounding team  Mild elevation of alk-phos and LFTs likely related to whipple -  Trend CMP  Anemia of chronic disease and chemotherapy, hgb baseline 9.3-9.6 -  Occult stool -  Iron studies, b12, folate wnl -  TSH mildly  elevated but unlikely to be responsible for anemia -  Transfused 2 units PRBC on 3/13  Probable sick euthyroid -  TSH 5.899 -  Check fT4 -  Repeat TFTs in 4 weeks  Thrombocytopenia, mild and asymptomatic, resolved  Severe protein-calorie malnutrition -  No PICC line and patient would like it NOT to be replaced if possible -  Start calorie count, nutrition reconsulted -  Needs to consume at least 1200- 1400 calories.  If not, may need to resume PICC line  Diet:  regular Access:  Port IVF:  off Proph:  lovenox   Code Status: full Family Communication: patient and sister Disposition Plan:   Possibly home Tuesday or Wed depending on progression  Consultants:  Radiology  Oncology  ID  Procedures:  CXR  Antibiotics:  vanc 3/11 >>  Cefepime 3/11 >>  HPI/Subjective:  Feeling better today.  Minimal cough, denies shortness of breath. Chills have improved.  Objective: Filed Vitals:   02/11/15 2341 02/12/15 0012 02/12/15 0254 02/12/15 0534  BP: 127/64 133/68 104/72 131/77  Pulse: 92 85 72 70  Temp: 99.1 F (37.3 C) 98.2 F (36.8 C) 97.9 F (36.6 C) 98.2 F (36.8 C)  TempSrc: Oral Oral Oral Oral  Resp: $Remo'18 16 14 16  'OYjxQ$ Height:      Weight:      SpO2: 100%   99%    Intake/Output Summary (Last 24 hours) at 02/12/15 1406 Last data filed at 02/12/15 1020  Gross per 24 hour  Intake   1729 ml  Output      0 ml  Net   1729 ml   [employer]  02/09/15 1031  Weight: 78.3 kg (172 lb 9.9 oz)    Exam:   General:  Adult F, No acute distress  HEENT:  NCAT, MMM  Cardiovascular:  RRR, nl S1, S2 no mrg, 2+ pulses, warm extremities  Respiratory:  Decreased breath sounds at the left base with some faint rales left base and left mid back, no wheezes or rhonchi, no focal rales or rhonchi, no increased WOB  Abdomen:   NABS, soft, NT/ND  MSK:   Normal tone and bulk, no LEE, port without surrounding erythema or induration, PICC line removed  Neuro:  Grossly  intact  Data Reviewed: Basic Metabolic Panel:  Recent Labs Lab 02/08/15 1132 02/09/15 0300 02/10/15 0420 02/11/15 0515 02/12/15 0550  NA 140 135 138 139 141  K 3.9 3.7 3.5 3.6 3.6  CL  --  108 111 112 111  CO2 $Re'22 22 23 22 22  'hhL$ GLUCOSE 113 145* 136* 136* 84  BUN 19.$Remov'1 22 15 20 15  'NRRGKp$ CREATININE 0.7 0.69 0.67 0.57 0.60  CALCIUM 8.8 8.4 8.1* 8.1* 8.3*  MG  --   --  2.0  --   --   PHOS  --   --  3.5  --   --    Liver Function Tests:  Recent Labs Lab 02/08/15 1132 02/09/15 0300 02/10/15 0420 02/11/15 0515 02/12/15 0550  AST 47* 53* 52* 34 47*  ALT 48 44* 47* 34 41*  ALKPHOS 279* 229* 215* 178* 176*  BILITOT 0.45 0.6 0.7 0.5 1.2  PROT 6.7 6.3 5.9* 5.5* 5.7*  ALBUMIN 2.8* 2.8* 2.6* 2.2* 2.6*    Recent Labs Lab 02/09/15 0300  LIPASE 10*   No results for input(s): AMMONIA in the last 168 hours. CBC:  Recent Labs Lab 02/08/15 1130 02/09/15 0300 02/10/15 0420 02/11/15 0515 02/12/15 0550  WBC 2.4* 4.4 2.3* 4.3 3.5*  NEUTROABS 1.7 3.7 1.9  --   --   HGB 9.3* 8.5* 7.7* 7.0* 9.0*  HCT 28.8* 26.9* 24.2* 22.2* 27.5*  MCV 87.3 89.4 88.3 88.4 87.3  PLT 121* 138* 134* 156 197   Cardiac Enzymes: No results for input(s): CKTOTAL, CKMB, CKMBINDEX, TROPONINI in the last 168 hours. BNP (last 3 results) No results for input(s): BNP in the last 8760 hours.  ProBNP (last 3 results) No results for input(s): PROBNP in the last 8760 hours.  CBG:  Recent Labs Lab 02/11/15 0012 02/11/15 0750 02/11/15 1630 02/11/15 2359 02/12/15 0816  GLUCAP 154* 116* 86 78 99    Recent Results (from the past 240 hour(s))  TECHNOLOGIST REVIEW     Status: None   Collection Time: 02/08/15 11:30 AM  Result Value Ref Range Status   Technologist Review Metas and Myelocytes present  Final  Urine culture     Status: None   Collection Time: 02/09/15  2:46 AM  Result Value Ref Range Status   Specimen Description URINE, CLEAN CATCH  Final   Special Requests NONE  Final   Colony Count NO  GROWTH Performed at [facility]   Final   Culture NO GROWTH Performed at [facility]   Final   Report Status 02/10/2015 FINAL  Final  Blood culture (routine x 2)     Status: None (Preliminary result)   Collection Time: 02/09/15  3:00 AM  Result Value Ref Range Status   Specimen Description BLOOD L WRIST  Final   Special Requests BOTTLES DRAWN AEROBIC AND ANAEROBIC 5CC  Final   Culture   Final  BLOOD CULTURE RECEIVED NO GROWTH TO DATE CULTURE WILL BE HELD FOR 5 DAYS BEFORE ISSUING A FINAL NEGATIVE REPORT Performed at [facility]    Report Status PENDING  Incomplete  Blood culture (routine x 2)     Status: None (Preliminary result)   Collection Time: 02/09/15  6:00 AM  Result Value Ref Range Status   Specimen Description BLOOD LEFT HAND  Final   Special Requests BOTTLES DRAWN AEROBIC AND ANAEROBIC 5ML  Final   Culture   Final           BLOOD CULTURE RECEIVED NO GROWTH TO DATE CULTURE WILL BE HELD FOR 5 DAYS BEFORE ISSUING A FINAL NEGATIVE REPORT Performed at [facility]    Report Status PENDING  Incomplete  Culture, blood (routine x 2)     Status: None (Preliminary result)   Collection Time: 02/11/15  8:43 AM  Result Value Ref Range Status   Specimen Description BLOOD LEFT ARM  Final   Special Requests   Final    BOTTLES DRAWN AEROBIC AND ANAEROBIC 10CC BOTH BOTTLES   Culture   Final           BLOOD CULTURE RECEIVED NO GROWTH TO DATE CULTURE WILL BE HELD FOR 5 DAYS BEFORE ISSUING A FINAL NEGATIVE REPORT Performed at [facility]    Report Status PENDING  Incomplete  Culture, blood (routine x 2)     Status: None (Preliminary result)   Collection Time: 02/11/15  9:06 AM  Result Value Ref Range Status   Specimen Description BLOOD LEFT ARM  Final   Special Requests   Final    BOTTLES DRAWN AEROBIC AND ANAEROBIC 10CC BOTH BOTTLES   Culture   Final           BLOOD CULTURE RECEIVED NO GROWTH TO DATE CULTURE WILL BE HELD FOR 5  DAYS BEFORE ISSUING A FINAL NEGATIVE REPORT Performed at [facility]    Report Status PENDING  Incomplete     Studies: Dg Chest 2 View  02/11/2015   CLINICAL DATA:  Fever.  EXAM: CHEST  2 VIEW  COMPARISON:  02/09/2015  FINDINGS: There is a left chest wall port a catheter with tip in the projection of the SVC. There is a right arm PICC line with tip in the projection of the SVC. Normal heart size. Moderate left pleural effusion is identified. This is unchanged from previous exam.  IMPRESSION: No significant change in the appearance of left pleural effusion.   Electronically Signed   By: Kerby Moors M.D.   On: 02/11/2015 07:55    Scheduled Meds: . atorvastatin  10 mg Oral Daily  . ceFEPime (MAXIPIME) IV  2 g Intravenous 3 times per day  . enoxaparin (LOVENOX) injection  40 mg Subcutaneous Q24H  . insulin aspart  0-9 Units Subcutaneous 3 times per day  . lisinopril  40 mg Oral Daily  . loratadine  10 mg Oral Daily  . metoCLOPramide  10 mg Oral TID  . sodium chloride  10-40 mL Intracatheter Q12H  . vancomycin  1,000 mg Intravenous Q12H   Continuous Infusions: . sodium chloride      Principal Problem:   HCAP (healthcare-associated pneumonia) Active Problems:   Osteoarthritis   History of depression   Essential hypertension, benign   Hyperlipidemia   Adenocarcinoma of uncinate process of pancreas   Asthma   Sepsis   Antineoplastic chemotherapy induced pancytopenia   Fever    Time spent: 30 min  Janece Canterbury  [facility] Pager [phone]. If 7PM-7AM, please contact night-coverage at [email or web address], password [facility] 02/12/2015, 2:06 PM  LOS: 3 days

## 2015-02-12 NOTE — Progress Notes (Signed)
RN verified with PCP on call to how many PRBC units were remaining.  Patient to receive 1 more unit of PRBC.  Will continue to monitor the patient.

## 2015-02-12 NOTE — Progress Notes (Signed)
INFECTIOUS DISEASE PROGRESS NOTE  ID: INETTE DOUBRAVA is a 64 y.o. female with  Principal Problem:   HCAP (healthcare-associated pneumonia) Active Problems:   Osteoarthritis   History of depression   Essential hypertension, benign   Hyperlipidemia   Adenocarcinoma of uncinate process of pancreas   Asthma   Sepsis   Antineoplastic chemotherapy induced pancytopenia   Fever  Subjective: Feels better  Abtx:  Anti-infectives    Start     Dose/Rate Route Frequency Ordered Stop   02/11/15 1400  ceFEPIme (MAXIPIME) 2 g in dextrose 5 % 50 mL IVPB     2 g 100 mL/hr over 30 Minutes Intravenous 3 times per day 02/11/15 0911 02/17/15 0559   02/09/15 1400  piperacillin-tazobactam (ZOSYN) IVPB 3.375 g  Status:  Discontinued     3.375 g 12.5 mL/hr over 240 Minutes Intravenous Every 8 hours 02/09/15 0548 02/09/15 0604   02/09/15 0630  ceFEPIme (MAXIPIME) 1 g in dextrose 5 % 50 mL IVPB  Status:  Discontinued     1 g 100 mL/hr over 30 Minutes Intravenous 3 times per day 02/09/15 0604 02/11/15 0911   02/09/15 0600  piperacillin-tazobactam (ZOSYN) IVPB 3.375 g  Status:  Discontinued     3.375 g 100 mL/hr over 30 Minutes Intravenous  Once 02/09/15 0546 02/09/15 0604   02/09/15 0600  vancomycin (VANCOCIN) IVPB 1000 mg/200 mL premix     1,000 mg 200 mL/hr over 60 Minutes Intravenous Every 12 hours 02/09/15 0547        Medications:  Scheduled: . atorvastatin  10 mg Oral Daily  . ceFEPime (MAXIPIME) IV  2 g Intravenous 3 times per day  . enoxaparin (LOVENOX) injection  40 mg Subcutaneous Q24H  . insulin aspart  0-9 Units Subcutaneous 3 times per day  . lisinopril  40 mg Oral Daily  . loratadine  10 mg Oral Daily  . metoCLOPramide  10 mg Oral TID  . sodium chloride  10-40 mL Intracatheter Q12H  . vancomycin  1,000 mg Intravenous Q12H    Objective: Vital signs in last 24 hours: Temp:  [97.9 F (36.6 C)-100.8 F (38.2 C)] 98.1 F (36.7 C) (03/14 1409) Pulse Rate:  [70-99] 73 (03/14  1409) Resp:  [14-22] 18 (03/14 1409) BP: (104-147)/(64-88) 137/88 mmHg (03/14 1409) SpO2:  [99 %-100 %] 99 % (03/14 1409)   General appearance: alert, cooperative and no distress Resp: diminished breath sounds anterior - bilateral Cardio: regular rate and rhythm GI: normal findings: bowel sounds normal and soft, non-tender  Lab Results  Recent Labs  02/11/15 0515 02/12/15 0550  WBC 4.3 3.5*  HGB 7.0* 9.0*  HCT 22.2* 27.5*  NA 139 141  K 3.6 3.6  CL 112 111  CO2 22 22  BUN 20 15  CREATININE 0.57 0.60   Liver Panel  Recent Labs  02/11/15 0515 02/12/15 0550  PROT 5.5* 5.7*  ALBUMIN 2.2* 2.6*  AST 34 47*  ALT 34 41*  ALKPHOS 178* 176*  BILITOT 0.5 1.2   Sedimentation Rate No results for input(s): ESRSEDRATE in the last 72 hours. C-Reactive Protein No results for input(s): CRP in the last 72 hours.  Microbiology: Recent Results (from the past 240 hour(s))  TECHNOLOGIST REVIEW     Status: None   Collection Time: 02/08/15 11:30 AM  Result Value Ref Range Status   Technologist Review Metas and Myelocytes present  Final  Urine culture     Status: None   Collection Time: 02/09/15  2:46 AM  Result Value Ref Range Status   Specimen Description URINE, CLEAN CATCH  Final   Special Requests NONE  Final   Colony Count NO GROWTH Performed at [facility]   Final   Culture NO GROWTH Performed at [facility]   Final   Report Status 02/10/2015 FINAL  Final  Blood culture (routine x 2)     Status: None (Preliminary result)   Collection Time: 02/09/15  3:00 AM  Result Value Ref Range Status   Specimen Description BLOOD L WRIST  Final   Special Requests BOTTLES DRAWN AEROBIC AND ANAEROBIC 5CC  Final   Culture   Final           BLOOD CULTURE RECEIVED NO GROWTH TO DATE CULTURE WILL BE HELD FOR 5 DAYS BEFORE ISSUING A FINAL NEGATIVE REPORT Performed at [facility]    Report Status PENDING  Incomplete  Blood culture (routine x 2)     Status:  None (Preliminary result)   Collection Time: 02/09/15  6:00 AM  Result Value Ref Range Status   Specimen Description BLOOD LEFT HAND  Final   Special Requests BOTTLES DRAWN AEROBIC AND ANAEROBIC 5ML  Final   Culture   Final           BLOOD CULTURE RECEIVED NO GROWTH TO DATE CULTURE WILL BE HELD FOR 5 DAYS BEFORE ISSUING A FINAL NEGATIVE REPORT Performed at [facility]    Report Status PENDING  Incomplete  Culture, blood (routine x 2)     Status: None (Preliminary result)   Collection Time: 02/11/15  8:43 AM  Result Value Ref Range Status   Specimen Description BLOOD LEFT ARM  Final   Special Requests   Final    BOTTLES DRAWN AEROBIC AND ANAEROBIC 10CC BOTH BOTTLES   Culture   Final           BLOOD CULTURE RECEIVED NO GROWTH TO DATE CULTURE WILL BE HELD FOR 5 DAYS BEFORE ISSUING A FINAL NEGATIVE REPORT Performed at [facility]    Report Status PENDING  Incomplete  Culture, blood (routine x 2)     Status: None (Preliminary result)   Collection Time: 02/11/15  9:06 AM  Result Value Ref Range Status   Specimen Description BLOOD LEFT ARM  Final   Special Requests   Final    BOTTLES DRAWN AEROBIC AND ANAEROBIC 10CC BOTH BOTTLES   Culture   Final           BLOOD CULTURE RECEIVED NO GROWTH TO DATE CULTURE WILL BE HELD FOR 5 DAYS BEFORE ISSUING A FINAL NEGATIVE REPORT Performed at [facility]    Report Status PENDING  Incomplete    Studies/Results: Dg Chest 2 View  02/11/2015   CLINICAL DATA:  Fever.  EXAM: CHEST  2 VIEW  COMPARISON:  02/09/2015  FINDINGS: There is a left chest wall port a catheter with tip in the projection of the SVC. There is a right arm PICC line with tip in the projection of the SVC. Normal heart size. Moderate left pleural effusion is identified. This is unchanged from previous exam.  IMPRESSION: No significant change in the appearance of left pleural effusion.   Electronically Signed   By: Kerby Moors M.D.   On: 02/11/2015 07:55      Assessment/Plan: Fever HCAP? Pancreatic Ca (last CTX 3-10) Non-neutropenic  Total days of antibiotics; vanco/cefepime  Would- Continue her anbx, give tomorrows doses If she remains afebrile, agree with home with  levaquin.  available if questions         Bobby Rumpf Infectious Diseases (pager) [phone] [email or web address] 02/12/2015, 4:13 PM  LOS: 3 days

## 2015-02-13 LAB — GLUCOSE, CAPILLARY: Glucose-Capillary: 90 mg/dL (ref 70–99)

## 2015-02-13 LAB — CBC
HCT: 28 % — ABNORMAL LOW (ref 36.0–46.0)
HEMOGLOBIN: 9.3 g/dL — AB (ref 12.0–15.0)
MCH: 28.5 pg (ref 26.0–34.0)
MCHC: 33.2 g/dL (ref 30.0–36.0)
MCV: 85.9 fL (ref 78.0–100.0)
Platelets: 241 10*3/uL (ref 150–400)
RBC: 3.26 MIL/uL — AB (ref 3.87–5.11)
RDW: 17.4 % — AB (ref 11.5–15.5)
WBC: 3.1 10*3/uL — AB (ref 4.0–10.5)

## 2015-02-13 LAB — FOLATE RBC
FOLATE, RBC: 1368 ng/mL (ref >498)
Folate, Hemolysate: 368 ng/mL
HEMATOCRIT: 26.9 % — AB (ref 34.0–46.6)

## 2015-02-13 LAB — COMPREHENSIVE METABOLIC PANEL
ALT: 44 U/L — ABNORMAL HIGH (ref 0–35)
AST: 52 U/L — ABNORMAL HIGH (ref 0–37)
Albumin: 2.5 g/dL — ABNORMAL LOW (ref 3.5–5.2)
Alkaline Phosphatase: 206 U/L — ABNORMAL HIGH (ref 39–117)
Anion gap: 8 (ref 5–15)
BUN: 11 mg/dL (ref 6–23)
CALCIUM: 8.4 mg/dL (ref 8.4–10.5)
CO2: 23 mmol/L (ref 19–32)
CREATININE: 0.67 mg/dL (ref 0.50–1.10)
Chloride: 108 mmol/L (ref 96–112)
GFR calc Af Amer: >90 mL/min (ref >90)
GLUCOSE: 91 mg/dL (ref 70–99)
Potassium: 3.2 mmol/L — ABNORMAL LOW (ref 3.5–5.1)
Sodium: 139 mmol/L (ref 135–145)
Total Bilirubin: 0.8 mg/dL (ref 0.3–1.2)
Total Protein: 6 g/dL (ref 6.0–8.3)

## 2015-02-13 LAB — TRANSFERRIN: TRANSFERRIN: 123 mg/dL — AB (ref 200–370)

## 2015-02-13 MED ORDER — HEPARIN SOD (PORK) LOCK FLUSH 100 UNIT/ML IV SOLN
500.0000 [IU] | INTRAVENOUS | Status: AC | PRN
Start: 2015-02-13 — End: 2015-02-13
  Administered 2015-02-13: 500 [IU]

## 2015-02-13 MED ORDER — LEVOFLOXACIN 750 MG PO TABS: 750.0000 mg | ORAL_TABLET | Freq: Every day | ORAL | Status: DC

## 2015-02-13 MED ORDER — POTASSIUM CHLORIDE CRYS ER 20 MEQ PO TBCR: 20.0000 meq | EXTENDED_RELEASE_TABLET | Freq: Every day | ORAL | Status: DC

## 2015-02-13 MED ORDER — POTASSIUM CHLORIDE CRYS ER 20 MEQ PO TBCR
40.0000 meq | EXTENDED_RELEASE_TABLET | Freq: Once | ORAL | Status: AC
  Administered 2015-02-13: 40 meq via ORAL
  Filled 2015-02-13: qty 2

## 2015-02-13 NOTE — Discharge Summary (Signed)
Physician Discharge Summary  SEE BEHARRY KDX:[id number] DOB: March 30, 1951 DOA: 02/08/2015  PCP: Eulas Post, MD  Admit date: 02/08/2015 Discharge date: 02/13/2015  Recommendations for Outpatient Follow-up:  1. Follow up with Dr. Benay Spice in 1 week for repeat CBC, BMP, CXR.  Please repeat TSH/fT4 in about 4 weeks 2. Please review how well she is eating and consider resuming PICC line and TPN if necessary  Discharge Diagnoses:  Principal Problem:   HCAP (healthcare-associated pneumonia) Active Problems:   Osteoarthritis   History of depression   Essential hypertension, benign   Hyperlipidemia   Adenocarcinoma of uncinate process of pancreas   Asthma   Sepsis   Antineoplastic chemotherapy induced pancytopenia   Fever   Discharge Condition: stable, improved  Diet recommendation: regular with whatever calorie supplements taste good  Wt Readings from Last 3 Encounters:  02/09/15 78.3 kg (172 lb 9.9 oz)  02/08/15 77.202 kg (170 lb 3.2 oz)  01/11/15 77.701 kg (171 lb 4.8 oz)    History of present illness:  The patient is a 64 year old female with history of hypertension, hyperlipidemia, GERD, asthma, pancreatic cancer status post Whipple and radiation currently on chemotherapy, who presented with fever. Her last dose of chemotherapy was one day prior to presentation. She developed fever on the same day with some mild abdominal comfort but no cough, shortness of breath, nausea, vomiting, diarrhea, dysuria. In the emergency department, she had a fever to 100.8 Fahrenheit, with blood cell count 4.4. Chest x-ray demonstrated left lower lobe pneumonia with a moderate left pleural effusion. Urinalysis was negative.  Hospital Course:   Sepsis due to HCAP (admitted in January) and associated small pleural effusion.   - Tele: NSR. Tele d/c'd on 3/11 - Started on IV Vancomycin and cefepime and completed 3-4 days of IV antibiotics - convert to oral levofloxacin to complete a 7-day  course - Urine legionella neg and S. pneumococcal antigen neg - flu negative - bcx NGTD - Attempted US-guided thoracentesis, however, there was not a significant amount of fluid  - d/c'd PICC line on 3/13 due to recurrent high fever - ID was consulted and agreed with management  HTN: -held HCTZ due to sepsis, but may resume on discharge -continued lisinopril  Asthma: stable. No wheezing on auscultation -Continued Symbicort  Hyperlipidemia: -Continued Lipitor  Pancreatic cancer: She is post status of Whipple procedure and radiation therapy.  - She is currently treated with chemotherapy. Last dose was 3/11. - Dr. Benay Spice agreed to follow up on pneumonia, effusion, and labs  Mild elevation of alk-phos and LFTs likely related to whipple - Trend CMP  Anemia of chronic disease and chemotherapy, hgb baseline 9.3-9.6 - Occult stool not obtained - Iron studies, b12, and folate were wnl - TSH mildly elevated but unlikely to be responsible for anemia - Transfused 2 units PRBC on 3/13  Probable sick euthyroid - TSH 5.899 - Check fT4 - Repeat TFTs in 4 weeks  Thrombocytopenia, mild and asymptomatic, resolved  Severe protein-calorie malnutrition - PICC line was discontinued, she actually ate >50% of her meals and was tolerating without nausea, vomiting  - Will give line holiday and a 1 week trial without TPN -  Dr. Benay Spice to please readdress in 1 week and resume PICC line and TPN then if needed  Consultants:  Radiology  Oncology  ID  Procedures:  CXR  Antibiotics:  vanc 3/11 >>  Cefepime 3/11 >>  Discharge Exam: Filed Vitals:   02/13/15 0445  BP: 148/74  Pulse: 75  Temp: 98.5 F (36.9 C)  Resp: 16   Filed Vitals:   02/12/15 1409 02/12/15 1846 02/12/15 2120 02/13/15 0445  BP: 137/88 136/79 143/77 148/74  Pulse: 73 70 77 75  Temp: 98.1 F (36.7 C) 97.8 F (36.6 C) 98.3 F (36.8 C) 98.5 F (36.9 C)  TempSrc: Oral Oral Oral Oral  Resp:  _0 Height:      Weight:      SpO2: 99% 99% 100% 98%     General: Adult F, No acute distress  HEENT: NCAT, MMM  Cardiovascular: RRR, nl S1, S2 no mrg, 2+ pulses, warm extremities  Respiratory: Decreased breath sounds at the left base with some faint rales left base and left mid back, no wheezes or rhonchi, no focal rales or rhonchi, no increased WOB, similar to yesterday  MSK: Normal tone and bulk, no LEE, port without surrounding erythema or induration  Neuro: Grossly intact  Discharge Instructions      Discharge Instructions    Call MD for:  difficulty breathing, headache or visual disturbances    Complete by:  As directed      Call MD for:  extreme fatigue    Complete by:  As directed      Call MD for:  hives    Complete by:  As directed      Call MD for:  persistant dizziness or light-headedness    Complete by:  As directed      Call MD for:  persistant nausea and vomiting    Complete by:  As directed      Call MD for:  severe uncontrolled pain    Complete by:  As directed      Call MD for:  temperature >100.4    Complete by:  As directed      Diet general    Complete by:  As directed      Discharge instructions    Complete by:  As directed   Please take levofloxacin until all the tabs are gone for pneumonia.  Please eat as much as you can (small and frequent meals if needed) and try whatever supplements taste good to you.  Some people like carnation instant breakfast or protein bars.  There are lots of options to help increase your calories.  If you have worsening shortness of breath, cough, fevers, please return immediately to the hospital.     Increase activity slowly    Complete by:  As directed             Medication List    STOP taking these medications        Pancrelipase (Lip-Prot-Amyl) 24000 UNITS Cpep      TAKE these medications        atorvastatin 10 MG tablet  Commonly known as:  LIPITOR  TAKE 1 TABLET BY MOUTH EVERY DAY      budesonide-formoterol 160-4.5 MCG/ACT inhaler  Commonly known as:  SYMBICORT  Inhale 1 puff into the lungs daily as needed (asthma symptoms).     calcium carbonate 500 MG chewable tablet  Commonly known as:  TUMS - dosed in mg elemental calcium  Chew 2 tablets by mouth daily as needed for indigestion or heartburn.     cetirizine 10 MG tablet  Commonly known as:  ZYRTEC  Take 10 mg by mouth daily as needed for allergies or rhinitis.     hydrochlorothiazide 25 MG tablet  Commonly known as:  HYDRODIURIL  TAKE 1 TABLET  BY MOUTH EVERY DAY     ibuprofen 200 MG tablet  Commonly known as:  ADVIL,MOTRIN  Take 600 mg by mouth 2 (two) times daily as needed (pain).     levofloxacin 750 MG tablet  Commonly known as:  LEVAQUIN  Take 1 tablet (750 mg total) by mouth daily.     lidocaine-prilocaine cream  Commonly known as:  EMLA  Apply 1 application topically as needed. Apply to port 1-2 hours prior to stick and cover with plastic wrap     lisinopril 40 MG tablet  Commonly known as:  PRINIVIL,ZESTRIL  TAKE 1 TABLET BY MOUTH EVERY DAY     LORazepam 0.5 MG tablet  Commonly known as:  ATIVAN  TAKE 1 TABLET BY MOUTH EVERY 6 TO 8 HOURS AS NEEDED SEVERE ANXIETY     metoCLOPramide 10 MG tablet  Commonly known as:  REGLAN  Take 10 mg by mouth 3 (three) times daily. Taking as needed     ondansetron 8 MG tablet  Commonly known as:  ZOFRAN  Take 1 tablet (8 mg total) by mouth every 8 (eight) hours as needed for nausea or vomiting.     potassium chloride SA 20 MEQ tablet  Commonly known as:  K-DUR,KLOR-CON  Take 1 tablet (20 mEq total) by mouth daily.     PRESCRIPTION MEDICATION  Chemotherapy at [facility]     promethazine 12.5 MG tablet  Commonly known as:  PHENERGAN  Take 1/2 tablet to 1 tablet every 6 hours as needed for nausea       Follow-up Information    Follow up with Betsy Coder, MD. Schedule an appointment as soon as possible for a visit in 1 week.   Specialty:  Oncology    Contact information:   [place] [address] [phone]       Follow up with Eulas Post, MD.   Specialty:  Family Medicine   Why:  As needed   Contact information:   [place] [address] [phone]        The results of significant diagnostics from this hospitalization (including imaging, microbiology, ancillary and laboratory) are listed below for reference.    Significant Diagnostic Studies: Dg Chest 2 View  02/11/2015   CLINICAL DATA:  Fever.  EXAM: CHEST  2 VIEW  COMPARISON:  02/09/2015  FINDINGS: There is a left chest wall port a catheter with tip in the projection of the SVC. There is a right arm PICC line with tip in the projection of the SVC. Normal heart size. Moderate left pleural effusion is identified. This is unchanged from previous exam.  IMPRESSION: No significant change in the appearance of left pleural effusion.   Electronically Signed   By: Kerby Moors M.D.   On: 02/11/2015 07:55   Dg Chest 2 View  02/09/2015   CLINICAL DATA:  Fever. History of pancreatic cancer, currently receiving chemotherapy.  EXAM: CHEST  2 VIEW  COMPARISON:  Most recent chest radiograph 12/13/2014.  FINDINGS: Tip of the right upper extremity PICC in the mid SVC. Tip of the left chest port in the mid SVC. Development of left basilar airspace disease and left pleural effusion. The right lung is clear. Cardiomediastinal contours are normal. No pneumothorax.  IMPRESSION: Left lower lobe airspace disease and moderate left pleural effusion, may reflect pneumonia with parapneumonic effusion given history of fever.   Electronically Signed   By: Jeb Levering M.D.   On: 02/09/2015 05:07  [place] Chest  02/09/2015   CLINICAL DATA:  Pancreatic cancer, left pleural effusion. Request is made for ultrasound-guided left thoracentesis  EXAM: CHEST ULTRASOUND  COMPARISON:  None.  FINDINGS: Limited ultrasound of posterior left chest reveals only a small  effusion.  IMPRESSION: Limited ultrasound posterior left chest revealing small effusion. Case was discussed with ordering MD and decision made to postpone thoracentesis today.  Read by: Rowe Robert, PA-C   Electronically Signed   By: Lucrezia Europe M.D.   On: 02/09/2015 15:51    Microbiology: Recent Results (from the past 240 hour(s))  TECHNOLOGIST REVIEW     Status: None   Collection Time: 02/08/15 11:30 AM  Result Value Ref Range Status   Technologist Review Metas and Myelocytes present  Final  Urine culture     Status: None   Collection Time: 02/09/15  2:46 AM  Result Value Ref Range Status   Specimen Description URINE, CLEAN CATCH  Final   Special Requests NONE  Final   Colony Count NO GROWTH Performed at [facility]   Final   Culture NO GROWTH Performed at [facility]   Final   Report Status 02/10/2015 FINAL  Final  Blood culture (routine x 2)     Status: None (Preliminary result)   Collection Time: 02/09/15  3:00 AM  Result Value Ref Range Status   Specimen Description BLOOD L WRIST  Final   Special Requests BOTTLES DRAWN AEROBIC AND ANAEROBIC 5CC  Final   Culture   Final           BLOOD CULTURE RECEIVED NO GROWTH TO DATE CULTURE WILL BE HELD FOR 5 DAYS BEFORE ISSUING A FINAL NEGATIVE REPORT Performed at [facility]    Report Status PENDING  Incomplete  Blood culture (routine x 2)     Status: None (Preliminary result)   Collection Time: 02/09/15  6:00 AM  Result Value Ref Range Status   Specimen Description BLOOD LEFT HAND  Final   Special Requests BOTTLES DRAWN AEROBIC AND ANAEROBIC 5ML  Final   Culture   Final           BLOOD CULTURE RECEIVED NO GROWTH TO DATE CULTURE WILL BE HELD FOR 5 DAYS BEFORE ISSUING A FINAL NEGATIVE REPORT Performed at [facility]    Report Status PENDING  Incomplete  Culture, blood (routine x 2)     Status: None (Preliminary result)   Collection Time: 02/11/15  8:43 AM  Result Value Ref Range Status    Specimen Description BLOOD LEFT ARM  Final   Special Requests   Final    BOTTLES DRAWN AEROBIC AND ANAEROBIC 10CC BOTH BOTTLES   Culture   Final           BLOOD CULTURE RECEIVED NO GROWTH TO DATE CULTURE WILL BE HELD FOR 5 DAYS BEFORE ISSUING A FINAL NEGATIVE REPORT Performed at [facility]    Report Status PENDING  Incomplete  Culture, blood (routine x 2)     Status: None (Preliminary result)   Collection Time: 02/11/15  9:06 AM  Result Value Ref Range Status   Specimen Description BLOOD LEFT ARM  Final   Special Requests   Final    BOTTLES DRAWN AEROBIC AND ANAEROBIC 10CC BOTH BOTTLES   Culture   Final           BLOOD CULTURE RECEIVED NO GROWTH TO DATE CULTURE WILL BE HELD FOR 5 DAYS BEFORE ISSUING A FINAL NEGATIVE REPORT Performed at [facility]    Report Status PENDING  Incomplete     Labs: Basic Metabolic Panel:  Recent Labs Lab 02/09/15 0300 02/10/15 0420 02/11/15 0515 02/12/15 0550 02/13/15 0610  NA 135 138 139 141 139  K 3.7 3.5 3.6 3.6 3.2*  CL 108 111 112 111 108  CO2 _0 GLUCOSE 145* 136* 136* 84 91  BUN _1 CREATININE 0.69 0.67 0.57 0.60 0.67  CALCIUM 8.4 8.1* 8.1* 8.3* 8.4  MG  --  2.0  --   --   --   PHOS  --  3.5  --   --   --    Liver Function Tests:  Recent Labs Lab 02/09/15 0300 02/10/15 0420 02/11/15 0515 02/12/15 0550 02/13/15 0610  AST 53* 52* 34 47* 52*  ALT 44* 47* 34 41* 44*  ALKPHOS 229* 215* 178* 176* 206*  BILITOT 0.6 0.7 0.5 1.2 0.8  PROT 6.3 5.9* 5.5* 5.7* 6.0  ALBUMIN 2.8* 2.6* 2.2* 2.6* 2.5*    Recent Labs Lab 02/09/15 0300  LIPASE 10*   No results for input(s): AMMONIA in the last 168 hours. CBC:  Recent Labs Lab 02/08/15 1130 02/09/15 0300 02/10/15 0420 02/11/15 0515 02/12/15 0550 02/13/15 0610  WBC 2.4* 4.4 2.3* 4.3 3.5* 3.1*  NEUTROABS 1.7 3.7 1.9  --   --   --   HGB 9.3* 8.5* 7.7* 7.0* 9.0* 9.3*  HCT 28.8* 26.9* 24.2* 22.2* 27.5* 28.0*  MCV 87.3 89.4 88.3 88.4  87.3 85.9  PLT 121* 138* 134* 156 197 241   Cardiac Enzymes: No results for input(s): CKTOTAL, CKMB, CKMBINDEX, TROPONINI in the last 168 hours. BNP: BNP (last 3 results) No results for input(s): BNP in the last 8760 hours.  ProBNP (last 3 results) No results for input(s): PROBNP in the last 8760 hours.  CBG:  Recent Labs Lab 02/11/15 2359 02/12/15 0816 02/12/15 1543 02/12/15 2149 02/13/15 0616  GLUCAP 78 99 76 86 90    Time coordinating discharge: 35 minutes  Signed:  Janece Canterbury  [facility] 02/13/2015, 11:27 AM

## 2015-02-15 LAB — CULTURE, BLOOD (ROUTINE X 2)
CULTURE: NO GROWTH
CULTURE: NO GROWTH

## 2015-02-17 LAB — CULTURE, BLOOD (ROUTINE X 2)
Culture: NO GROWTH
Culture: NO GROWTH

## 2015-02-18 ENCOUNTER — Other Ambulatory Visit: Payer: Self-pay | Admitting: Oncology

## 2015-02-20 NOTE — Telephone Encounter (Signed)
S/w Dr. Benay Spice, ok to see him first then labs/flush/chemo..... Sent msg to pt on my chart confirming information..... KJ

## 2015-02-21 ENCOUNTER — Ambulatory Visit (HOSPITAL_BASED_OUTPATIENT_CLINIC_OR_DEPARTMENT_OTHER): Payer: Medicare Other | Admitting: Oncology

## 2015-02-21 ENCOUNTER — Telehealth: Payer: Self-pay | Admitting: Oncology

## 2015-02-21 ENCOUNTER — Other Ambulatory Visit: Payer: Self-pay | Admitting: *Deleted

## 2015-02-21 ENCOUNTER — Ambulatory Visit (HOSPITAL_BASED_OUTPATIENT_CLINIC_OR_DEPARTMENT_OTHER): Payer: Medicare Other

## 2015-02-21 ENCOUNTER — Other Ambulatory Visit: Payer: Medicare Other

## 2015-02-21 ENCOUNTER — Other Ambulatory Visit (HOSPITAL_BASED_OUTPATIENT_CLINIC_OR_DEPARTMENT_OTHER): Payer: Medicare Other

## 2015-02-21 VITALS — BP 148/86 | HR 76 | Temp 97.8°F | Resp 18 | Ht 63.0 in | Wt 165.3 lb

## 2015-02-21 DIAGNOSIS — R978 Other abnormal tumor markers: Secondary | ICD-10-CM

## 2015-02-21 DIAGNOSIS — J90 Pleural effusion, not elsewhere classified: Secondary | ICD-10-CM | POA: Diagnosis not present

## 2015-02-21 DIAGNOSIS — I10 Essential (primary) hypertension: Secondary | ICD-10-CM

## 2015-02-21 DIAGNOSIS — C257 Malignant neoplasm of other parts of pancreas: Secondary | ICD-10-CM

## 2015-02-21 DIAGNOSIS — C250 Malignant neoplasm of head of pancreas: Secondary | ICD-10-CM

## 2015-02-21 DIAGNOSIS — Z5111 Encounter for antineoplastic chemotherapy: Secondary | ICD-10-CM

## 2015-02-21 LAB — COMPREHENSIVE METABOLIC PANEL (CC13)
ALT: 55 U/L (ref 0–55)
ANION GAP: 10 meq/L (ref 3–11)
AST: 59 U/L — ABNORMAL HIGH (ref 5–34)
Albumin: 3 g/dL — ABNORMAL LOW (ref 3.5–5.0)
Alkaline Phosphatase: 222 U/L — ABNORMAL HIGH (ref 40–150)
BUN: 10.1 mg/dL (ref 7.0–26.0)
CHLORIDE: 111 meq/L — AB (ref 98–109)
CO2: 22 mEq/L (ref 22–29)
Calcium: 8.7 mg/dL (ref 8.4–10.4)
Creatinine: 0.8 mg/dL (ref 0.6–1.1)
EGFR: 83 mL/min/{1.73_m2} — ABNORMAL LOW (ref >90)
Glucose: 138 mg/dl (ref 70–140)
Potassium: 3.1 mEq/L — ABNORMAL LOW (ref 3.5–5.1)
SODIUM: 143 meq/L (ref 136–145)
Total Bilirubin: 0.42 mg/dL (ref 0.20–1.20)
Total Protein: 6.7 g/dL (ref 6.4–8.3)

## 2015-02-21 LAB — CBC WITH DIFFERENTIAL/PLATELET
BASO%: 1 % (ref 0.0–2.0)
BASOS ABS: 0 10*3/uL (ref 0.0–0.1)
EOS ABS: 0.1 10*3/uL (ref 0.0–0.5)
EOS%: 1.8 % (ref 0.0–7.0)
HCT: 35.1 % (ref 34.8–46.6)
HEMOGLOBIN: 11.5 g/dL — AB (ref 11.6–15.9)
LYMPH#: 0.7 10*3/uL — AB (ref 0.9–3.3)
LYMPH%: 17.1 % (ref 14.0–49.7)
MCH: 28.2 pg (ref 25.1–34.0)
MCHC: 32.6 g/dL (ref 31.5–36.0)
MCV: 86.3 fL (ref 79.5–101.0)
MONO#: 0.5 10*3/uL (ref 0.1–0.9)
MONO%: 13.5 % (ref 0.0–14.0)
NEUT%: 66.6 % (ref 38.4–76.8)
NEUTROS ABS: 2.7 10*3/uL (ref 1.5–6.5)
Platelets: 283 10*3/uL (ref 145–400)
RBC: 4.07 10*6/uL (ref 3.70–5.45)
RDW: 19 % — AB (ref 11.2–14.5)
WBC: 4.1 10*3/uL (ref 3.9–10.3)

## 2015-02-21 MED ORDER — SODIUM CHLORIDE 0.9 % IV SOLN
Freq: Once | INTRAVENOUS | Status: DC
  Filled 2015-02-21: qty 4

## 2015-02-21 MED ORDER — SODIUM CHLORIDE 0.9 % IJ SOLN
10.0000 mL | INTRAMUSCULAR | Status: DC | PRN
  Administered 2015-02-21: 10 mL
  Filled 2015-02-21: qty 10

## 2015-02-21 MED ORDER — HEPARIN SOD (PORK) LOCK FLUSH 100 UNIT/ML IV SOLN
500.0000 [IU] | Freq: Once | INTRAVENOUS | Status: AC | PRN
  Administered 2015-02-21: 500 [IU]
  Filled 2015-02-21: qty 5

## 2015-02-21 MED ORDER — POTASSIUM CHLORIDE CRYS ER 20 MEQ PO TBCR: 20.0000 meq | EXTENDED_RELEASE_TABLET | Freq: Two times a day (BID) | ORAL | Status: DC

## 2015-02-21 MED ORDER — SODIUM CHLORIDE 0.9 % IV SOLN
800.0000 mg/m2 | Freq: Once | INTRAVENOUS | Status: AC
  Administered 2015-02-21: 1482 mg via INTRAVENOUS
  Filled 2015-02-21: qty 38.98

## 2015-02-21 MED ORDER — SODIUM CHLORIDE 0.9 % IV SOLN
Freq: Once | INTRAVENOUS | Status: AC
  Administered 2015-02-21: 13:00:00 via INTRAVENOUS

## 2015-02-21 NOTE — Progress Notes (Signed)
[facility] OFFICE PROGRESS NOTE   Diagnosis: Pancreas cancer  INTERVAL HISTORY:   She completed another cycle of gemcitabine 02/08/2015. She developed a fever on the evening of chemotherapy and presented to the emergency room. There were no associated symptoms. A chest x-ray revealed evidence of left lower lobe airspace disease and a left pleural effusion. She was admitted and treated for pneumonia. She was referred for a left thoracentesis and it was not enough fluid present for the procedure. The infectious disease service was consulted. Cultures were negative. The right PICC was removed and she is no longer taking TNA. She completed a course of IV antibiotics and was transitioned to Levaquin at discharge.  No recurrent fever. She now feels well. She no longer has nausea or diarrhea. She is not taking pancreatic enzyme replacement. She complains of soreness at the left lower posterior and anterior chest wall. She is bothered by a white exudate on the tongue. She reports adequate oral intake since discontinuing the TNA. Objective:  Vital signs in last 24 hours:  Blood pressure 148/86, pulse 76, temperature 97.8 F (36.6 C), temperature source Oral, resp. rate 18, height 5\' 3"  (1.6 m), weight 165 lb 4.8 oz (74.98 kg), last menstrual period 10/31/2005.    HEENT: Mild whitecoat over the tongue, no buccal thrush Resp: Lungs clear bilaterally Cardio: Regular rate and rhythm GI: No hepatomegaly, no mass, mild tenderness in the left upper abdomen Vascular: No leg edema    Portacath/PICC-without erythema  Lab Results:  Lab Results  Component Value Date   WBC 4.1 02/21/2015   HGB 11.5* 02/21/2015   HCT 35.1 02/21/2015   MCV 86.3 02/21/2015   PLT 283 02/21/2015   NEUTROABS 2.7 02/21/2015      Imaging: Chest x-ray from the March 2016 hospital admission reviewed  Medications: I have reviewed the patient's current medications.  Assessment/Plan: 1. Adenocarcinoma  the pancreas, uncinate mass, EUS April 2015 revealed a uT3,uN1 lesion with an FNA biopsy confirming adenocarcinoma  MRI abdomen 07/04/2014 confirmed a pancreas uncinate 8 mass, 11 mm portacaval lymph node, no vascular involvement, and no evidence of distant metastatic disease   PET scan 07/20/2012 with a hypermetabolic uncinate process mass and hypermetabolic periportal lymph node   Markedly elevated CA 19-9   Negative diagnostic laparoscopy 07/24/2014   Initiation of Xeloda/radiation 08/10/2014.completed 09/21/2014.  CT 10/03/2014 confirmed a decrease in the pancreas mass, stable portacaval lymph node, and apparent involvement of the superior mesenteric artery  Pancreaticoduodenectomy on 11/02/2014 confirmed a poorly differentiated adenocarcinoma, ypT2,ypN0  Initiation of adjuvant gemcitabine 12/27/2014 2. Hypertension 3. Hyperlipidemia  4. Vaginal spotting summer 2015-evaluated by gynecology  5. Zoster rash at June 2015  6. Asthma  7. Family history of pancreas cancer 8. Nausea following the pancreaticoduodenectomy procedure. Improved. 9. Anemia. 10. Admission with a fever 02/09/2015-no source for infection identified, treated for presumptive pneumonia 11. Left pleural effusion  Disposition:  Sonya Larson appears well today. She will begin a third cycle of adjuvant gemcitabine today. I suspect the high fever on 02/08/2015 was related to gemcitabine.  The CA 19-9 is persistently elevated and she has a left pleural effusion. I am concerned the effusion could be malignant. The plan is to proceed with gemcitabine today and again on 02/28/2015. She will return for an office visit and repeat chest x-ray on 03/07/2015.  She will try Tylenol or ibuprofen if she develops a fever after chemotherapy today. She will contact us for a persistent fever or new symptoms.  [place],  Sonya Severin, MD  02/21/2015  12:39 PM

## 2015-02-21 NOTE — Telephone Encounter (Signed)
gave and printed appt sched and avs for pt for March and April.... °

## 2015-02-21 NOTE — Progress Notes (Signed)
1310-Pt took Zofran 8 mg PO at home at 1030.  Dr. Benay Spice notified and no need to give Zofran 8 mg IV as pre med with today's Gemzar treatment.  1320-DrBenay Spice notified of K-3.1.  Order received to increase oral K dose to 20 meq BID.  Pt instructed on MD orders.  BMET to be drawn with next weeks labs per order of Dr. Benay Spice.

## 2015-02-21 NOTE — Patient Instructions (Addendum)
Per Dr. Benay Spice increase your potassium to twice a day.  [facility] Discharge Instructions for Patients Receiving Chemotherapy  Today you received the following chemotherapy agents: Gemzar.   To help prevent nausea and vomiting after your treatment, we encourage you to take your nausea medication as directed.    If you develop nausea and vomiting that is not controlled by your nausea medication, call the clinic.   BELOW ARE SYMPTOMS THAT SHOULD BE REPORTED IMMEDIATELY:  *FEVER GREATER THAN 100.5 F  *CHILLS WITH OR WITHOUT FEVER  NAUSEA AND VOMITING THAT IS NOT CONTROLLED WITH YOUR NAUSEA MEDICATION  *UNUSUAL SHORTNESS OF BREATH  *UNUSUAL BRUISING OR BLEEDING  TENDERNESS IN MOUTH AND THROAT WITH OR WITHOUT PRESENCE OF ULCERS  *URINARY PROBLEMS  *BOWEL PROBLEMS  UNUSUAL RASH Items with * indicate a potential emergency and should be followed up as soon as possible.  Feel free to call the clinic you have any questions or concerns. The clinic phone number is [phone].  Please show the [place] at check-in to the Emergency Department and triage nurse.

## 2015-02-21 NOTE — Progress Notes (Signed)
Dr. Benay Spice aware of pt's potassium of 3.1 and informed patient to increase potassium to twice daily.  Pt verbalized understanding.

## 2015-02-22 ENCOUNTER — Other Ambulatory Visit: Payer: Self-pay | Admitting: *Deleted

## 2015-02-22 LAB — CANCER ANTIGEN 19-9: CA 19 9: 1736.7 U/mL — AB (ref <35.0)

## 2015-02-22 MED ORDER — FLUCONAZOLE 100 MG PO TABS: 100.0000 mg | ORAL_TABLET | Freq: Every day | ORAL | Status: DC

## 2015-02-22 NOTE — Telephone Encounter (Signed)
Patient called.  She was in to see Dr. Benay Spice yesterday and the anti-fungal medicine they discussed has not been called in.  Discussed with Dr. Benay Spice and I will escribe Diflucan.  He also wanted me to let her know that her CA19-9 came by high.  The plan is to repeat a CXR in 2 weeks and if the fluid is still there, they will draw it off and test for infection and for cancer.   Spoke with patient and let her know about the diflucan and Dr. Gearldine Shown plan.  She wanted to know exactly what the CA19-9 was and I did let her know.  She verbalized understanding about how to take the diflucan and what the plan would be in the next 2 weeks.

## 2015-02-25 ENCOUNTER — Other Ambulatory Visit: Payer: Self-pay | Admitting: Oncology

## 2015-02-28 ENCOUNTER — Ambulatory Visit: Payer: Medicare Other

## 2015-02-28 ENCOUNTER — Ambulatory Visit (HOSPITAL_BASED_OUTPATIENT_CLINIC_OR_DEPARTMENT_OTHER): Payer: Medicare Other

## 2015-02-28 ENCOUNTER — Other Ambulatory Visit (HOSPITAL_BASED_OUTPATIENT_CLINIC_OR_DEPARTMENT_OTHER): Payer: Medicare Other

## 2015-02-28 DIAGNOSIS — C257 Malignant neoplasm of other parts of pancreas: Secondary | ICD-10-CM | POA: Diagnosis not present

## 2015-02-28 DIAGNOSIS — Z95828 Presence of other vascular implants and grafts: Secondary | ICD-10-CM

## 2015-02-28 DIAGNOSIS — C250 Malignant neoplasm of head of pancreas: Secondary | ICD-10-CM

## 2015-02-28 DIAGNOSIS — Z5111 Encounter for antineoplastic chemotherapy: Secondary | ICD-10-CM

## 2015-02-28 LAB — CBC WITH DIFFERENTIAL/PLATELET
BASO%: 2.7 % — ABNORMAL HIGH (ref 0.0–2.0)
BASOS ABS: 0.1 10*3/uL (ref 0.0–0.1)
EOS%: 0.4 % (ref 0.0–7.0)
Eosinophils Absolute: 0 10*3/uL (ref 0.0–0.5)
HEMATOCRIT: 32.2 % — AB (ref 34.8–46.6)
HEMOGLOBIN: 10.6 g/dL — AB (ref 11.6–15.9)
LYMPH%: 22.1 % (ref 14.0–49.7)
MCH: 28.8 pg (ref 25.1–34.0)
MCHC: 32.9 g/dL (ref 31.5–36.0)
MCV: 87.5 fL (ref 79.5–101.0)
MONO#: 0.3 10*3/uL (ref 0.1–0.9)
MONO%: 15 % — ABNORMAL HIGH (ref 0.0–14.0)
NEUT%: 59.8 % (ref 38.4–76.8)
NEUTROS ABS: 1.4 10*3/uL — AB (ref 1.5–6.5)
PLATELETS: 238 10*3/uL (ref 145–400)
RBC: 3.68 10*6/uL — AB (ref 3.70–5.45)
RDW: 16.7 % — ABNORMAL HIGH (ref 11.2–14.5)
WBC: 2.3 10*3/uL — AB (ref 3.9–10.3)
lymph#: 0.5 10*3/uL — ABNORMAL LOW (ref 0.9–3.3)

## 2015-02-28 LAB — COMPREHENSIVE METABOLIC PANEL (CC13)
ALBUMIN: 2.8 g/dL — AB (ref 3.5–5.0)
ALK PHOS: 225 U/L — AB (ref 40–150)
ALT: 46 U/L (ref 0–55)
AST: 62 U/L — ABNORMAL HIGH (ref 5–34)
Anion Gap: 11 mEq/L (ref 3–11)
BUN: 5.1 mg/dL — ABNORMAL LOW (ref 7.0–26.0)
CHLORIDE: 107 meq/L (ref 98–109)
CO2: 26 mEq/L (ref 22–29)
Calcium: 8.7 mg/dL (ref 8.4–10.4)
Creatinine: 0.8 mg/dL (ref 0.6–1.1)
EGFR: 81 mL/min/{1.73_m2} — ABNORMAL LOW (ref >90)
Glucose: 119 mg/dl (ref 70–140)
POTASSIUM: 3.2 meq/L — AB (ref 3.5–5.1)
Sodium: 145 mEq/L (ref 136–145)
Total Bilirubin: 0.55 mg/dL (ref 0.20–1.20)
Total Protein: 6.6 g/dL (ref 6.4–8.3)

## 2015-02-28 MED ORDER — SODIUM CHLORIDE 0.9 % IJ SOLN
10.0000 mL | INTRAMUSCULAR | Status: DC | PRN
  Administered 2015-02-28 (×2): 10 mL via INTRAVENOUS
  Filled 2015-02-28: qty 10

## 2015-02-28 MED ORDER — HEPARIN SOD (PORK) LOCK FLUSH 100 UNIT/ML IV SOLN
500.0000 [IU] | Freq: Once | INTRAVENOUS | Status: AC
  Administered 2015-02-28: 500 [IU] via INTRAVENOUS
  Filled 2015-02-28: qty 5

## 2015-02-28 MED ORDER — SODIUM CHLORIDE 0.9 % IV SOLN
800.0000 mg/m2 | Freq: Once | INTRAVENOUS | Status: AC
  Administered 2015-02-28: 1482 mg via INTRAVENOUS
  Filled 2015-02-28: qty 38.98

## 2015-02-28 MED ORDER — SODIUM CHLORIDE 0.9 % IV SOLN
Freq: Once | INTRAVENOUS | Status: AC
  Administered 2015-02-28: 11:00:00 via INTRAVENOUS

## 2015-02-28 MED ORDER — HEPARIN SOD (PORK) LOCK FLUSH 100 UNIT/ML IV SOLN
500.0000 [IU] | Freq: Once | INTRAVENOUS | Status: AC | PRN
  Administered 2015-02-28: 500 [IU]
  Filled 2015-02-28: qty 5

## 2015-02-28 MED ORDER — SODIUM CHLORIDE 0.9 % IV SOLN
Freq: Once | INTRAVENOUS | Status: AC
Start: 2015-02-28 — End: 2015-02-28
  Administered 2015-02-28: 12:00:00 via INTRAVENOUS
  Filled 2015-02-28: qty 4

## 2015-02-28 MED ORDER — SODIUM CHLORIDE 0.9 % IJ SOLN
10.0000 mL | INTRAMUSCULAR | Status: DC | PRN
  Filled 2015-02-28: qty 10

## 2015-02-28 NOTE — Progress Notes (Signed)
Ok to treat with ANC 1.4 and Alkaline phosphatase 225 per Dr. Benay Spice

## 2015-02-28 NOTE — Patient Instructions (Signed)

## 2015-02-28 NOTE — Patient Instructions (Signed)
[facility] Discharge Instructions for Patients Receiving Chemotherapy  Today you received the following chemotherapy agents Gemzar  To help prevent nausea and vomiting after your treatment, we encourage you to take your nausea medication as prescribed   If you develop nausea and vomiting that is not controlled by your nausea medication, call the clinic.   BELOW ARE SYMPTOMS THAT SHOULD BE REPORTED IMMEDIATELY:  *FEVER GREATER THAN 100.5 F  *CHILLS WITH OR WITHOUT FEVER  NAUSEA AND VOMITING THAT IS NOT CONTROLLED WITH YOUR NAUSEA MEDICATION  *UNUSUAL SHORTNESS OF BREATH  *UNUSUAL BRUISING OR BLEEDING  TENDERNESS IN MOUTH AND THROAT WITH OR WITHOUT PRESENCE OF ULCERS  *URINARY PROBLEMS  *BOWEL PROBLEMS  UNUSUAL RASH Items with * indicate a potential emergency and should be followed up as soon as possible.  Feel free to call the clinic you have any questions or concerns. The clinic phone number is [phone].  Please show the CHEMO ALERT CARD at check-in to the Emergency Department and triage nurse.   

## 2015-03-04 ENCOUNTER — Other Ambulatory Visit: Payer: Self-pay | Admitting: Oncology

## 2015-03-06 ENCOUNTER — Ambulatory Visit (HOSPITAL_COMMUNITY)
Admission: RE | Admit: 2015-03-06 | Discharge: 2015-03-06 | Disposition: A | Discharge status: Home / Self Care | Payer: Medicare Other | Source: Ambulatory Visit | Attending: Oncology | Admitting: Oncology

## 2015-03-06 DIAGNOSIS — J90 Pleural effusion, not elsewhere classified: Secondary | ICD-10-CM | POA: Diagnosis not present

## 2015-03-06 DIAGNOSIS — C250 Malignant neoplasm of head of pancreas: Secondary | ICD-10-CM

## 2015-03-07 ENCOUNTER — Other Ambulatory Visit (HOSPITAL_BASED_OUTPATIENT_CLINIC_OR_DEPARTMENT_OTHER): Payer: Medicare Other

## 2015-03-07 ENCOUNTER — Ambulatory Visit (HOSPITAL_BASED_OUTPATIENT_CLINIC_OR_DEPARTMENT_OTHER): Payer: Medicare Other

## 2015-03-07 ENCOUNTER — Ambulatory Visit: Payer: Medicare Other

## 2015-03-07 ENCOUNTER — Telehealth: Payer: Self-pay | Admitting: *Deleted

## 2015-03-07 ENCOUNTER — Telehealth: Payer: Self-pay | Admitting: Nurse Practitioner

## 2015-03-07 ENCOUNTER — Ambulatory Visit (HOSPITAL_BASED_OUTPATIENT_CLINIC_OR_DEPARTMENT_OTHER): Payer: Medicare Other | Admitting: Nurse Practitioner

## 2015-03-07 VITALS — BP 147/91 | HR 73 | Temp 98.3°F | Resp 20 | Ht 63.0 in | Wt 161.2 lb

## 2015-03-07 DIAGNOSIS — Z452 Encounter for adjustment and management of vascular access device: Secondary | ICD-10-CM

## 2015-03-07 DIAGNOSIS — J90 Pleural effusion, not elsewhere classified: Secondary | ICD-10-CM | POA: Diagnosis not present

## 2015-03-07 DIAGNOSIS — Z95828 Presence of other vascular implants and grafts: Secondary | ICD-10-CM

## 2015-03-07 DIAGNOSIS — D649 Anemia, unspecified: Secondary | ICD-10-CM | POA: Diagnosis not present

## 2015-03-07 DIAGNOSIS — C257 Malignant neoplasm of other parts of pancreas: Secondary | ICD-10-CM

## 2015-03-07 DIAGNOSIS — C250 Malignant neoplasm of head of pancreas: Secondary | ICD-10-CM

## 2015-03-07 DIAGNOSIS — D696 Thrombocytopenia, unspecified: Secondary | ICD-10-CM | POA: Diagnosis not present

## 2015-03-07 LAB — COMPREHENSIVE METABOLIC PANEL (CC13)
ALBUMIN: 2.7 g/dL — AB (ref 3.5–5.0)
ALK PHOS: 244 U/L — AB (ref 40–150)
ALT: 77 U/L — ABNORMAL HIGH (ref 0–55)
AST: 108 U/L — ABNORMAL HIGH (ref 5–34)
Anion Gap: 12 mEq/L — ABNORMAL HIGH (ref 3–11)
BUN: 5.1 mg/dL — ABNORMAL LOW (ref 7.0–26.0)
CO2: 25 meq/L (ref 22–29)
Calcium: 8.7 mg/dL (ref 8.4–10.4)
Chloride: 106 mEq/L (ref 98–109)
Creatinine: 0.7 mg/dL (ref 0.6–1.1)
EGFR: 85 mL/min/{1.73_m2} — AB (ref >90)
GLUCOSE: 107 mg/dL (ref 70–140)
Potassium: 3.2 mEq/L — ABNORMAL LOW (ref 3.5–5.1)
SODIUM: 143 meq/L (ref 136–145)
TOTAL PROTEIN: 6.5 g/dL (ref 6.4–8.3)
Total Bilirubin: 0.67 mg/dL (ref 0.20–1.20)

## 2015-03-07 LAB — CBC WITH DIFFERENTIAL/PLATELET
BASO%: 1.1 % (ref 0.0–2.0)
Basophils Absolute: 0 10*3/uL (ref 0.0–0.1)
EOS%: 0.3 % (ref 0.0–7.0)
Eosinophils Absolute: 0 10*3/uL (ref 0.0–0.5)
HCT: 29.4 % — ABNORMAL LOW (ref 34.8–46.6)
HGB: 9.5 g/dL — ABNORMAL LOW (ref 11.6–15.9)
LYMPH%: 15.1 % (ref 14.0–49.7)
MCH: 27.8 pg (ref 25.1–34.0)
MCHC: 32.3 g/dL (ref 31.5–36.0)
MCV: 86.1 fL (ref 79.5–101.0)
MONO#: 0.2 10*3/uL (ref 0.1–0.9)
MONO%: 7.7 % (ref 0.0–14.0)
NEUT#: 1.7 10*3/uL (ref 1.5–6.5)
NEUT%: 75.8 % (ref 38.4–76.8)
Platelets: 92 10*3/uL — ABNORMAL LOW (ref 145–400)
RBC: 3.42 10*6/uL — AB (ref 3.70–5.45)
RDW: 16.9 % — ABNORMAL HIGH (ref 11.2–14.5)
WBC: 2.2 10*3/uL — AB (ref 3.9–10.3)
lymph#: 0.3 10*3/uL — ABNORMAL LOW (ref 0.9–3.3)

## 2015-03-07 LAB — TECHNOLOGIST REVIEW

## 2015-03-07 LAB — CANCER ANTIGEN 19-9: CA 19-9: 2037.9 U/mL — ABNORMAL HIGH (ref <35.0)

## 2015-03-07 MED ORDER — HEPARIN SOD (PORK) LOCK FLUSH 100 UNIT/ML IV SOLN
500.0000 [IU] | Freq: Once | INTRAVENOUS | Status: AC
  Administered 2015-03-07: 500 [IU] via INTRAVENOUS
  Filled 2015-03-07: qty 5

## 2015-03-07 MED ORDER — SODIUM CHLORIDE 0.9 % IJ SOLN
10.0000 mL | INTRAMUSCULAR | Status: DC | PRN
  Administered 2015-03-07: 10 mL via INTRAVENOUS
  Filled 2015-03-07: qty 10

## 2015-03-07 NOTE — Telephone Encounter (Signed)
Appointments made and avs printed for patient,email to michelle to add chemo

## 2015-03-07 NOTE — Progress Notes (Signed)
[facility] OFFICE PROGRESS NOTE   Diagnosis:  Pancreas cancer  INTERVAL HISTORY:   Sonya Larson returns as scheduled. She was last treated with gemcitabine 02/28/2015. She had no fever following treatment. She has mild intermittent nausea. No vomiting. She notes a marked decrease in her appetite beginning day 3 or 4 following chemotherapy. Appetite has since returned to baseline. She denies pain. She has a slight cough. She denies shortness of breath. No bleeding.  Objective:  Vital signs in last 24 hours:  Blood pressure 147/91, pulse 73, temperature 98.3 F (36.8 C), temperature source Oral, resp. rate 20, height 5\' 3"  (1.6 m), weight 161 lb 3.2 oz (73.12 kg), last menstrual period 10/31/2005, SpO2 97 %.    HEENT: No thrush or ulcers. Lymphatics: No palpable cervical or supraclavicular lymph nodes. Resp: Breath sounds diminished at the left lung base. No respiratory distress. Cardio: Regular rate and rhythm. GI: Abdomen soft and nontender. No hepatomegaly. No mass. Vascular: No leg edema. Calves soft and nontender. Port-A-Cath without erythema.  Lab Results:  Lab Results  Component Value Date   WBC 2.2* 03/07/2015   HGB 9.5* 03/07/2015   HCT 29.4* 03/07/2015   MCV 86.1 03/07/2015   PLT 92* 03/07/2015   NEUTROABS 1.7 03/07/2015    Imaging:  Dg Chest 2 View  03/06/2015   CLINICAL DATA:  Followup LEFT pleural effusion. Adenocarcinoma of the pancreas. Subsequent encounter.  EXAM: CHEST  2 VIEW  COMPARISON:  02/11/2015.  FINDINGS: Cardiopericardial silhouette appears similar prior although the LEFT heart border is obscured by moderate LEFT pleural effusion.  This LEFT pleural effusion is slightly larger than on the most recent exam of 02/11/2015 with increase in size most evident on the lateral view. No RIGHT pleural effusion. LEFT subclavian power port is unchanged with the tip in the mid superior vena cava.  IMPRESSION: Mild increase in size of moderate RIGHT  pleural effusion.   Electronically Signed   By: Sonya Larson M.D.   On: 03/06/2015 16:03    Medications: I have reviewed the patient's current medications.  Assessment/Plan: 1. Adenocarcinoma the pancreas, uncinate mass, EUS April 2015 revealed a uT3,uN1 lesion with an FNA biopsy confirming adenocarcinoma  MRI abdomen 07/04/2014 confirmed a pancreas uncinate 8 mass, 11 mm portacaval lymph node, no vascular involvement, and no evidence of distant metastatic disease   PET scan 07/20/2012 with a hypermetabolic uncinate process mass and hypermetabolic periportal lymph node   Markedly elevated CA 19-9   Negative diagnostic laparoscopy 07/24/2014   Initiation of Xeloda/radiation 08/10/2014.completed 09/21/2014.  CT 10/03/2014 confirmed a decrease in the pancreas mass, stable portacaval lymph node, and apparent involvement of the superior mesenteric artery  Pancreaticoduodenectomy on 11/02/2014 confirmed a poorly differentiated adenocarcinoma, ypT2,ypN0  Initiation of adjuvant gemcitabine 12/27/2014 2. Hypertension 3. Hyperlipidemia  4. Vaginal spotting summer 2015-evaluated by gynecology  5. Zoster rash at June 2015  6. Asthma  7. Family history of pancreas cancer 8. Nausea following the pancreaticoduodenectomy procedure. Improved. 9. Anemia. 10. Admission with a fever 02/09/2015-no source for infection identified, treated for presumptive pneumonia 11. Left pleural effusion. Slightly larger on follow-up chest x-ray 03/06/2015.   Disposition: Ms. Pauli appears stable. She has completed 8 treatments with gemcitabine. She has thrombocytopenia on labs today. We will hold today's treatment and reschedule for one week.  The left pleural effusion was mildly increased on the chest x-ray 03/06/2015. Sonya Larson recommends proceeding with a thoracentesis with fluid to be sent for cytology. A referral was made to  [facility]ony[facility] Larson radiology for the thoracentesis.  The potassium level  continues to be low. She will increase Kdur to 20 meq twice daily for 3 days and then resume 20  meq daily.  She will return for labs and a follow-up visit on 03/13/2015. She will contact the office in the interim with any problems.     Ned Card ANP/GNP-BC   03/07/2015  3:15 PM

## 2015-03-07 NOTE — Telephone Encounter (Signed)
Per staff message and POF I have scheduled appts. Advised scheduler of appts. JMW  

## 2015-03-07 NOTE — Patient Instructions (Signed)

## 2015-03-08 ENCOUNTER — Telehealth: Payer: Self-pay | Admitting: *Deleted

## 2015-03-08 ENCOUNTER — Ambulatory Visit (HOSPITAL_COMMUNITY)
Admission: RE | Admit: 2015-03-08 | Discharge: 2015-03-08 | Disposition: A | Discharge status: Home / Self Care | Payer: Medicare Other | Source: Ambulatory Visit | Attending: Radiology | Admitting: Radiology

## 2015-03-08 ENCOUNTER — Ambulatory Visit (HOSPITAL_COMMUNITY)
Admission: RE | Admit: 2015-03-08 | Discharge: 2015-03-08 | Disposition: A | Discharge status: Home / Self Care | Payer: Medicare Other | Source: Ambulatory Visit | Attending: Nurse Practitioner | Admitting: Nurse Practitioner

## 2015-03-08 DIAGNOSIS — C384 Malignant neoplasm of pleura: Secondary | ICD-10-CM | POA: Insufficient documentation

## 2015-03-08 DIAGNOSIS — C250 Malignant neoplasm of head of pancreas: Secondary | ICD-10-CM

## 2015-03-08 DIAGNOSIS — Z9889 Other specified postprocedural states: Secondary | ICD-10-CM

## 2015-03-08 DIAGNOSIS — J948 Other specified pleural conditions: Secondary | ICD-10-CM | POA: Diagnosis present

## 2015-03-08 DIAGNOSIS — J90 Pleural effusion, not elsewhere classified: Secondary | ICD-10-CM

## 2015-03-08 NOTE — Telephone Encounter (Signed)
Called pt with appointment for thoracentesis at [facility] today at 1:30. She voiced understanding.

## 2015-03-08 NOTE — Procedures (Signed)
US guided diagnostic/therapeutic left thoracentesis performed yielding 600 cc's yellow fluid. A portion of the fluid was sent to the lab for cytology. F/u CXR pending. No immediate complications.

## 2015-03-13 ENCOUNTER — Telehealth: Payer: Self-pay | Admitting: Oncology

## 2015-03-13 ENCOUNTER — Other Ambulatory Visit: Payer: Medicare Other

## 2015-03-13 ENCOUNTER — Other Ambulatory Visit (HOSPITAL_BASED_OUTPATIENT_CLINIC_OR_DEPARTMENT_OTHER): Payer: Medicare Other

## 2015-03-13 ENCOUNTER — Ambulatory Visit: Payer: Medicare Other

## 2015-03-13 ENCOUNTER — Ambulatory Visit (HOSPITAL_BASED_OUTPATIENT_CLINIC_OR_DEPARTMENT_OTHER): Payer: Medicare Other | Admitting: Oncology

## 2015-03-13 VITALS — BP 153/86 | HR 83 | Temp 98.2°F | Wt 161.7 lb

## 2015-03-13 VITALS — BP 149/82 | HR 72 | Temp 97.8°F | Resp 18 | Ht 63.0 in | Wt 161.4 lb

## 2015-03-13 DIAGNOSIS — C257 Malignant neoplasm of other parts of pancreas: Secondary | ICD-10-CM | POA: Diagnosis present

## 2015-03-13 DIAGNOSIS — C250 Malignant neoplasm of head of pancreas: Secondary | ICD-10-CM

## 2015-03-13 DIAGNOSIS — J90 Pleural effusion, not elsewhere classified: Secondary | ICD-10-CM | POA: Diagnosis not present

## 2015-03-13 DIAGNOSIS — I10 Essential (primary) hypertension: Secondary | ICD-10-CM

## 2015-03-13 DIAGNOSIS — D696 Thrombocytopenia, unspecified: Secondary | ICD-10-CM | POA: Diagnosis not present

## 2015-03-13 DIAGNOSIS — Z95828 Presence of other vascular implants and grafts: Secondary | ICD-10-CM

## 2015-03-13 LAB — COMPREHENSIVE METABOLIC PANEL (CC13)
ALK PHOS: 197 U/L — AB (ref 40–150)
ALT: 40 U/L (ref 0–55)
AST: 53 U/L — AB (ref 5–34)
Albumin: 2.9 g/dL — ABNORMAL LOW (ref 3.5–5.0)
Anion Gap: 7 mEq/L (ref 3–11)
BILIRUBIN TOTAL: 0.57 mg/dL (ref 0.20–1.20)
BUN: 9.2 mg/dL (ref 7.0–26.0)
CO2: 24 mEq/L (ref 22–29)
CREATININE: 0.8 mg/dL (ref 0.6–1.1)
Calcium: 8.7 mg/dL (ref 8.4–10.4)
Chloride: 112 mEq/L — ABNORMAL HIGH (ref 98–109)
EGFR: 78 mL/min/{1.73_m2} — ABNORMAL LOW (ref >90)
Glucose: 131 mg/dl (ref 70–140)
Potassium: 3.5 mEq/L (ref 3.5–5.1)
Sodium: 144 mEq/L (ref 136–145)
Total Protein: 6.3 g/dL — ABNORMAL LOW (ref 6.4–8.3)

## 2015-03-13 LAB — CBC WITH DIFFERENTIAL/PLATELET
BASO%: 0.2 % (ref 0.0–2.0)
Basophils Absolute: 0 10*3/uL (ref 0.0–0.1)
EOS%: 2.3 % (ref 0.0–7.0)
Eosinophils Absolute: 0.1 10*3/uL (ref 0.0–0.5)
HEMATOCRIT: 31.4 % — AB (ref 34.8–46.6)
HGB: 10.2 g/dL — ABNORMAL LOW (ref 11.6–15.9)
LYMPH%: 8.6 % — AB (ref 14.0–49.7)
MCH: 28.8 pg (ref 25.1–34.0)
MCHC: 32.5 g/dL (ref 31.5–36.0)
MCV: 88.7 fL (ref 79.5–101.0)
MONO#: 0.5 10*3/uL (ref 0.1–0.9)
MONO%: 10.7 % (ref 0.0–14.0)
NEUT#: 3.4 10*3/uL (ref 1.5–6.5)
NEUT%: 78.2 % — AB (ref 38.4–76.8)
Platelets: 340 10*3/uL (ref 145–400)
RBC: 3.55 10*6/uL — ABNORMAL LOW (ref 3.70–5.45)
RDW: 18.2 % — ABNORMAL HIGH (ref 11.2–14.5)
WBC: 4.3 10*3/uL (ref 3.9–10.3)
lymph#: 0.4 10*3/uL — ABNORMAL LOW (ref 0.9–3.3)
nRBC: 0 % (ref 0–0)

## 2015-03-13 MED ORDER — HEPARIN SOD (PORK) LOCK FLUSH 100 UNIT/ML IV SOLN
500.0000 [IU] | Freq: Once | INTRAVENOUS | Status: AC
  Administered 2015-03-13: 500 [IU] via INTRAVENOUS
  Filled 2015-03-13: qty 5

## 2015-03-13 MED ORDER — SODIUM CHLORIDE 0.9 % IJ SOLN
10.0000 mL | INTRAMUSCULAR | Status: DC | PRN
  Administered 2015-03-13: 10 mL via INTRAVENOUS
  Filled 2015-03-13: qty 10

## 2015-03-13 NOTE — Patient Instructions (Signed)

## 2015-03-13 NOTE — Progress Notes (Signed)
  [facility] OFFICE PROGRESS NOTE   Diagnosis: Pancreas cancer   INTERVAL HISTORY:   She returns as scheduled. Gemcitabine was held last week secondary to thrombocytopenia. She reports feeling well. She reports discomfort in the subscapular area bilaterally. She underwent a ultrasound-guided left thoracentesis on 03/08/2015. The cytology ([id number]) revealed malignant cells consistent with metastatic adenocarcinoma.  Objective:  Vital signs in last 24 hours:  Blood pressure 149/82, pulse 72, temperature 97.8 F (36.6 C), temperature source Oral, resp. rate 18, height 5\' 3"  (1.6 m), weight 161 lb 6.4 oz (73.211 kg), last menstrual period 10/31/2005, SpO2 99 %.    HEENT: No thrush or ulcers Lymphatics: No cervical, supraclavicular, or axillary nodes Resp: Lungs clear bilaterally Cardio: Regular rate and rhythm GI: No hepatomegaly, nontender, no mass Vascular: No leg edema   Portacath/PICC-without erythema  Lab Results:  Lab Results  Component Value Date   WBC 4.3 03/13/2015   HGB 10.2* 03/13/2015   HCT 31.4* 03/13/2015   MCV 88.7 03/13/2015   PLT 340 03/13/2015   NEUTROABS 3.4 03/13/2015   03/07/2015-CA 19-9: 2038   Imaging:  Chest x-ray is reviewed with Sonya Larson Medications: I have reviewed the patient's current medications.  Assessment/Plan: 1. Adenocarcinoma the pancreas, uncinate mass, EUS April 2015 revealed a uT3,uN1 lesion with an FNA biopsy confirming adenocarcinoma  MRI abdomen 07/04/2014 confirmed a pancreas uncinate 8 mass, 11 mm portacaval lymph node, no vascular involvement, and no evidence of distant metastatic disease   PET scan 07/20/2012 with a hypermetabolic uncinate process mass and hypermetabolic periportal lymph node   Markedly elevated CA 19-9   Negative diagnostic laparoscopy 07/24/2014   Initiation of Xeloda/radiation 08/10/2014.completed 09/21/2014.  CT 10/03/2014 confirmed a decrease in the pancreas mass,  stable portacaval lymph node, and apparent involvement of the superior mesenteric artery  Pancreaticoduodenectomy on 11/02/2014 confirmed a poorly differentiated adenocarcinoma, ypT2,ypN0  Initiation of adjuvant gemcitabine 12/27/2014  Rising CA 19-07 February 2015  Left thoracentesis 03/08/2015 confirmed metastatic adenocarcinoma 2. Hypertension 3. Hyperlipidemia  4. Vaginal spotting summer 2015-evaluated by gynecology  5. Zoster rash at June 2015  6. Asthma  7. Family history of pancreas cancer 8. Nausea following the pancreaticoduodenectomy procedure. Improved. 9. Anemia. 10. Admission with a fever 02/09/2015-no source for infection identified, treated for presumptive pneumonia 11. Left pleural effusion. Slightly larger on follow-up chest x-ray 03/06/2015. Status post a diagnostic/therapeutic thoracentesis 03/08/2015    Disposition:  Sonya Larson appears stable. I discussed the pleural fluid cytology with Sonya Larson and her husband. She has metastatic pancreas cancer. No therapy will be curative. We discussed treatment options. She will be a candidate for a Pleurx catheter and systemic chemotherapy.  She will be scheduled for a restaging CT evaluation and then a follow-up office visit 03/16/2015.  Betsy Coder, MD  03/13/2015  9:08 AM

## 2015-03-13 NOTE — Telephone Encounter (Signed)
Pt confirmed labs/ov per 04/12 POF, gave pt AVS and Calendar.... KJ  °

## 2015-03-15 ENCOUNTER — Ambulatory Visit (HOSPITAL_COMMUNITY)
Admission: RE | Admit: 2015-03-15 | Discharge: 2015-03-15 | Disposition: A | Discharge status: Home / Self Care | Payer: Medicare Other | Source: Ambulatory Visit | Attending: Oncology | Admitting: Oncology

## 2015-03-15 ENCOUNTER — Encounter (HOSPITAL_COMMUNITY): Payer: Self-pay

## 2015-03-15 DIAGNOSIS — R079 Chest pain, unspecified: Secondary | ICD-10-CM | POA: Insufficient documentation

## 2015-03-15 DIAGNOSIS — R1012 Left upper quadrant pain: Secondary | ICD-10-CM | POA: Insufficient documentation

## 2015-03-15 DIAGNOSIS — C250 Malignant neoplasm of head of pancreas: Secondary | ICD-10-CM | POA: Diagnosis present

## 2015-03-15 DIAGNOSIS — Z9221 Personal history of antineoplastic chemotherapy: Secondary | ICD-10-CM | POA: Diagnosis not present

## 2015-03-15 DIAGNOSIS — Z923 Personal history of irradiation: Secondary | ICD-10-CM | POA: Insufficient documentation

## 2015-03-15 MED ORDER — IOHEXOL 300 MG/ML  SOLN
100.0000 mL | Freq: Once | INTRAMUSCULAR | Status: AC | PRN
  Administered 2015-03-15: 100 mL via INTRAVENOUS

## 2015-03-15 MED ORDER — IOHEXOL 300 MG/ML  SOLN
50.0000 mL | Freq: Once | INTRAMUSCULAR | Status: AC | PRN
  Administered 2015-03-15: 50 mL via ORAL

## 2015-03-16 ENCOUNTER — Telehealth: Payer: Self-pay | Admitting: Oncology

## 2015-03-16 ENCOUNTER — Ambulatory Visit (HOSPITAL_BASED_OUTPATIENT_CLINIC_OR_DEPARTMENT_OTHER): Payer: Medicare Other | Admitting: Oncology

## 2015-03-16 VITALS — BP 158/85 | HR 80 | Temp 98.1°F | Resp 18 | Ht 63.0 in | Wt 160.0 lb

## 2015-03-16 DIAGNOSIS — I10 Essential (primary) hypertension: Secondary | ICD-10-CM | POA: Diagnosis not present

## 2015-03-16 DIAGNOSIS — C250 Malignant neoplasm of head of pancreas: Secondary | ICD-10-CM

## 2015-03-16 DIAGNOSIS — D649 Anemia, unspecified: Secondary | ICD-10-CM

## 2015-03-16 DIAGNOSIS — C257 Malignant neoplasm of other parts of pancreas: Secondary | ICD-10-CM

## 2015-03-16 NOTE — Progress Notes (Signed)
[facility] OFFICE PROGRESS NOTE   Diagnosis: Pancreas cancer  INTERVAL HISTORY:   Ms. Sonya Larson returns as scheduled. She underwent restaging CTs earlier today. She feels well. She has noted discomfort at the left upper back since undergoing the thoracentesis procedure. The pain is relieved with Tylenol.  Objective:  Vital signs in last 24 hours:  Blood pressure 158/85, pulse 80, temperature 98.1 F (36.7 C), temperature source Oral, resp. rate 18, height 5\' 3"  (1.6 m), weight 160 lb (72.576 kg), last menstrual period 10/31/2005, SpO2 99 %.    Resp: Decreased breath sounds at the left lower chest, no respiratory distress Cardio: Regular rate and rhythm GI: No hepatomegaly, nontender, no mass Vascular: No leg edema  Portacath/PICC-without erythema  Lab Results:  Lab Results  Component Value Date   WBC 4.3 03/13/2015   HGB 10.2* 03/13/2015   HCT 31.4* 03/13/2015   MCV 88.7 03/13/2015   PLT 340 03/13/2015   NEUTROABS 3.4 03/13/2015   CA 19-9 on 03/07/2015: 2038   Imaging:  Ct Chest W Contrast  03/16/2015   CLINICAL DATA:  Pancreatic cancer diagnosed in July 2015 status post Whipple procedure in December. Radiation therapy completed. Chemotherapy ongoing. Left upper abdominal and chest pain. Subsequent encounter.  EXAM: CT CHEST, ABDOMEN, AND PELVIS WITH CONTRAST  TECHNIQUE: Multidetector CT imaging of the chest, abdomen and pelvis was performed following the standard protocol during bolus administration of intravenous contrast.  CONTRAST:  160mL OMNIPAQUE IOHEXOL 300 MG/ML  SOLN  COMPARISON:  Abdominal CT 10/03/2014, PET-CT 07/20/2014 and abdominal MRI 07/04/2014.  FINDINGS: CT CHEST FINDINGS  Mediastinum/Nodes: There are no enlarged mediastinal, hilar or axillary lymph nodes. The thyroid gland, trachea and esophagus demonstrate no significant findings. The heart size is normal. There is no pericardial effusion.There is minimal atherosclerosis. Left subclavian  Port-A-Cath tip at the SVC right atrial junction.  Lungs/Pleura: Moderate size left pleural effusion is associated with diffuse pleural nodularity consistent with known metastatic disease as documented on thoracentesis 1 week prior. No significant pleural fluid on the right. There is mild associated nodularity along the left major fissure. There is partial left lower lobe and lingular atelectasis. No suspicious pulmonary nodules are identified within the left lung. However, posteriorly in the right lower lobe, there is a new 10 mm nodule on image 49. In addition, there are scattered ground-glass densities in both lungs which are new compared with the prior PET-CT. The densest component is in the right middle lobe, measuring 12 mm on image 35. There is ill-defined left apical component on image 15 and in the right lower lobe on image 31.  Musculoskeletal/Chest wall: No suspicious osseous findings or chest wall lesions.  CT ABDOMEN AND PELVIS FINDINGS  Hepatobiliary: Hepatic steatosis appears progressively heterogeneous. No focal hepatic lesions identified to suggest metastatic disease. No significant biliary dilatation status post Whipple procedure.  Pancreas: Interval Whipple procedure with resection of the low-density mass involving the uncinate process. The ill-defined soft tissue density extending posterior to the superior mesenteric artery is unchanged, best seen on arterial phase images 39 through 42. No pancreatic mass or pancreatic ductal dilatation identified. There is no surrounding inflammatory change.  Spleen: Normal in size without focal abnormality.  Adrenals/Urinary Tract: Both adrenal glands appear normal.The kidneys appear unchanged. There is a tiny cyst in the lower pole of the right kidney. No evidence of urinary tract calculus, hydronephrosis or bladder abnormality.  Stomach/Bowel: No evidence of bowel wall thickening, distention or surrounding inflammatory change.  Vascular/Lymphatic: Allowing  for unopacified proximal duodenum, no evidence of adenopathy in the porta hepatis. Stable mild aortoiliac atherosclerosis.  Reproductive: The uterus and ovaries appear unchanged. There is no evidence of adnexal mass.  Other: Postsurgical changes within the anterior abdominal wall. No ascites or definite peritoneal nodularity to suggest carcinomatosis.  Musculoskeletal: No acute or significant osseous findings. Scattered sclerotic densities in the pelvis are stable, likely bone islands.  IMPRESSION: 1. Left pleural effusion with associated pleural nodularity corresponding with known metastatic disease on recent thoracentesis. 2. Right lower lobe pulmonary nodule concerning for metastatic disease. There are additional less specific ground-glass densities in both lungs which could be inflammatory. Attention on follow-up recommended. 3. Interval Whipple procedure with stable nonspecific in soft tissue posterior to the superior mesenteric artery. No progressive local disease, peritoneal carcinomatosis or hepatic metastases identified.   Electronically Signed   By: Richardean Sale M.D.   On: 03/16/2015 08:57   Ct Abdomen Pelvis W Contrast  03/16/2015   CLINICAL DATA:  Pancreatic cancer diagnosed in July 2015 status post Whipple procedure in December. Radiation therapy completed. Chemotherapy ongoing. Left upper abdominal and chest pain. Subsequent encounter.  EXAM: CT CHEST, ABDOMEN, AND PELVIS WITH CONTRAST  TECHNIQUE: Multidetector CT imaging of the chest, abdomen and pelvis was performed following the standard protocol during bolus administration of intravenous contrast.  CONTRAST:  130mL OMNIPAQUE IOHEXOL 300 MG/ML  SOLN  COMPARISON:  Abdominal CT 10/03/2014, PET-CT 07/20/2014 and abdominal MRI 07/04/2014.  FINDINGS: CT CHEST FINDINGS  Mediastinum/Nodes: There are no enlarged mediastinal, hilar or axillary lymph nodes. The thyroid gland, trachea and esophagus demonstrate no significant findings. The heart size is  normal. There is no pericardial effusion.There is minimal atherosclerosis. Left subclavian Port-A-Cath tip at the SVC right atrial junction.  Lungs/Pleura: Moderate size left pleural effusion is associated with diffuse pleural nodularity consistent with known metastatic disease as documented on thoracentesis 1 week prior. No significant pleural fluid on the right. There is mild associated nodularity along the left major fissure. There is partial left lower lobe and lingular atelectasis. No suspicious pulmonary nodules are identified within the left lung. However, posteriorly in the right lower lobe, there is a new 10 mm nodule on image 49. In addition, there are scattered ground-glass densities in both lungs which are new compared with the prior PET-CT. The densest component is in the right middle lobe, measuring 12 mm on image 35. There is ill-defined left apical component on image 15 and in the right lower lobe on image 31.  Musculoskeletal/Chest wall: No suspicious osseous findings or chest wall lesions.  CT ABDOMEN AND PELVIS FINDINGS  Hepatobiliary: Hepatic steatosis appears progressively heterogeneous. No focal hepatic lesions identified to suggest metastatic disease. No significant biliary dilatation status post Whipple procedure.  Pancreas: Interval Whipple procedure with resection of the low-density mass involving the uncinate process. The ill-defined soft tissue density extending posterior to the superior mesenteric artery is unchanged, best seen on arterial phase images 39 through 42. No pancreatic mass or pancreatic ductal dilatation identified. There is no surrounding inflammatory change.  Spleen: Normal in size without focal abnormality.  Adrenals/Urinary Tract: Both adrenal glands appear normal.The kidneys appear unchanged. There is a tiny cyst in the lower pole of the right kidney. No evidence of urinary tract calculus, hydronephrosis or bladder abnormality.  Stomach/Bowel: No evidence of bowel wall  thickening, distention or surrounding inflammatory change.  Vascular/Lymphatic: Allowing for unopacified proximal duodenum, no evidence of adenopathy in the porta hepatis. Stable mild aortoiliac atherosclerosis.  Reproductive: The  uterus and ovaries appear unchanged. There is no evidence of adnexal mass.  Other: Postsurgical changes within the anterior abdominal wall. No ascites or definite peritoneal nodularity to suggest carcinomatosis.  Musculoskeletal: No acute or significant osseous findings. Scattered sclerotic densities in the pelvis are stable, likely bone islands.  IMPRESSION: 1. Left pleural effusion with associated pleural nodularity corresponding with known metastatic disease on recent thoracentesis. 2. Right lower lobe pulmonary nodule concerning for metastatic disease. There are additional less specific ground-glass densities in both lungs which could be inflammatory. Attention on follow-up recommended. 3. Interval Whipple procedure with stable nonspecific in soft tissue posterior to the superior mesenteric artery. No progressive local disease, peritoneal carcinomatosis or hepatic metastases identified.   Electronically Signed   By: Richardean Sale M.D.   On: 03/16/2015 08:57   Images reviewed Medications: I have reviewed the patient's current medications.  Assessment/Plan: 1. Adenocarcinoma the pancreas, uncinate mass, EUS April 2015 revealed a uT3,uN1 lesion with an FNA biopsy confirming adenocarcinoma  MRI abdomen 07/04/2014 confirmed a pancreas uncinate 8 mass, 11 mm portacaval lymph node, no vascular involvement, and no evidence of distant metastatic disease   PET scan 07/20/2012 with a hypermetabolic uncinate process mass and hypermetabolic periportal lymph node   Markedly elevated CA 19-9   Negative diagnostic laparoscopy 07/24/2014   Initiation of Xeloda/radiation 08/10/2014.completed 09/21/2014.  CT 10/03/2014 confirmed a decrease in the pancreas mass, stable portacaval  lymph node, and apparent involvement of the superior mesenteric artery  Pancreaticoduodenectomy on 11/02/2014 confirmed a poorly differentiated adenocarcinoma, ypT2,ypN0  Initiation of adjuvant gemcitabine 12/27/2014  Rising CA 19-07 February 2015  Left thoracentesis 03/08/2015 confirmed metastatic adenocarcinoma  CTs 03/16/2015 with a left pleural effusion and new right lung nodule 2. Hypertension 3. Hyperlipidemia  4. Vaginal spotting summer 2015-evaluated by gynecology  5. Zoster rash at June 2015  6. Asthma  7. Family history of pancreas cancer 8. Nausea following the pancreaticoduodenectomy procedure. Improved. 9. Anemia. 10. Admission with a fever 02/09/2015-no source for infection identified, treated for presumptive pneumonia 11. Left pleural effusion. Slightly larger on follow-up chest x-ray 03/06/2015. Status post a diagnostic/therapeutic thoracentesis 03/08/2015   Disposition:  Ms. Sonya Larson has been diagnosed with metastatic pancreas cancer. I discussed the prognosis and treatment options with her. No therapy will be curative. We discussed observation, standard salvage therapy with gemcitabine/Abraxane or FOLFIRINOX, and referral for a clinical trial. She is not comfortable with observation. She progressed on single agent gemcitabine. I discussed the expected response rate with gemcitabine/Abraxane and FOLFIRINOX. She would like to proceed with FOLFIRINOX chemotherapy next week.  We reviewed the potential toxicities associated with this regimen including the chance for nausea/vomiting, mucositis, alopecia, diarrhea, and hematologic toxicity. We discussed the rash, hyperpigmentation, and hand/foot syndrome associated with 5 fluorouracil. We reviewed the various types of neuropathy seem with oxaliplatin. We discussed the diarrhea and abdominal pain seen with irinotecan. She agrees to proceed.  We will follow her clinical status, the CA 19-9, and pleural effusion as markers of  response.  A first cycle of FOLFIRINOX is scheduled 03/21/2015. She will return for office visit and cycle 2 on 04/04/2015.  Approximately 40 minutes were spent with the patient today. The majority of the time was used for counseling and coordination of care.  Betsy Coder, MD  03/16/2015  4:22 PM

## 2015-03-16 NOTE — Telephone Encounter (Signed)
Gave avs & calendar for April/May. Sent emssage to schedule treatment.

## 2015-03-18 ENCOUNTER — Other Ambulatory Visit: Payer: Self-pay | Admitting: Oncology

## 2015-03-20 ENCOUNTER — Other Ambulatory Visit: Payer: Self-pay | Admitting: *Deleted

## 2015-03-20 ENCOUNTER — Telehealth: Payer: Self-pay | Admitting: Oncology

## 2015-03-20 NOTE — Telephone Encounter (Signed)
Called and left a message with one hr prior labs per pof   anne

## 2015-03-21 ENCOUNTER — Ambulatory Visit (HOSPITAL_BASED_OUTPATIENT_CLINIC_OR_DEPARTMENT_OTHER): Payer: Medicare Other | Admitting: Nurse Practitioner

## 2015-03-21 ENCOUNTER — Other Ambulatory Visit (HOSPITAL_BASED_OUTPATIENT_CLINIC_OR_DEPARTMENT_OTHER): Payer: Medicare Other

## 2015-03-21 ENCOUNTER — Emergency Department (HOSPITAL_COMMUNITY)
Admission: EM | Admit: 2015-03-21 | Discharge: 2015-03-21 | Disposition: A | Discharge status: Home / Self Care | Payer: Medicare Other | Attending: Emergency Medicine | Admitting: Emergency Medicine

## 2015-03-21 ENCOUNTER — Emergency Department (HOSPITAL_COMMUNITY): Payer: Medicare Other

## 2015-03-21 ENCOUNTER — Ambulatory Visit: Payer: Medicare Other

## 2015-03-21 ENCOUNTER — Ambulatory Visit (HOSPITAL_BASED_OUTPATIENT_CLINIC_OR_DEPARTMENT_OTHER): Payer: Medicare Other

## 2015-03-21 ENCOUNTER — Encounter (HOSPITAL_COMMUNITY): Payer: Self-pay

## 2015-03-21 ENCOUNTER — Encounter: Payer: Self-pay | Admitting: Nurse Practitioner

## 2015-03-21 VITALS — BP 185/89 | HR 63 | Temp 98.6°F | Resp 19

## 2015-03-21 DIAGNOSIS — Z8619 Personal history of other infectious and parasitic diseases: Secondary | ICD-10-CM | POA: Diagnosis not present

## 2015-03-21 DIAGNOSIS — R4789 Other speech disturbances: Secondary | ICD-10-CM | POA: Insufficient documentation

## 2015-03-21 DIAGNOSIS — Z79899 Other long term (current) drug therapy: Secondary | ICD-10-CM | POA: Diagnosis not present

## 2015-03-21 DIAGNOSIS — Z8719 Personal history of other diseases of the digestive system: Secondary | ICD-10-CM | POA: Diagnosis not present

## 2015-03-21 DIAGNOSIS — C257 Malignant neoplasm of other parts of pancreas: Secondary | ICD-10-CM

## 2015-03-21 DIAGNOSIS — Z791 Long term (current) use of non-steroidal anti-inflammatories (NSAID): Secondary | ICD-10-CM | POA: Insufficient documentation

## 2015-03-21 DIAGNOSIS — Z86010 Personal history of colonic polyps: Secondary | ICD-10-CM | POA: Insufficient documentation

## 2015-03-21 DIAGNOSIS — M1990 Unspecified osteoarthritis, unspecified site: Secondary | ICD-10-CM | POA: Insufficient documentation

## 2015-03-21 DIAGNOSIS — T7840XA Allergy, unspecified, initial encounter: Secondary | ICD-10-CM

## 2015-03-21 DIAGNOSIS — R479 Unspecified speech disturbances: Secondary | ICD-10-CM

## 2015-03-21 DIAGNOSIS — J45909 Unspecified asthma, uncomplicated: Secondary | ICD-10-CM | POA: Insufficient documentation

## 2015-03-21 DIAGNOSIS — C259 Malignant neoplasm of pancreas, unspecified: Secondary | ICD-10-CM | POA: Diagnosis not present

## 2015-03-21 DIAGNOSIS — T451X5A Adverse effect of antineoplastic and immunosuppressive drugs, initial encounter: Secondary | ICD-10-CM | POA: Diagnosis not present

## 2015-03-21 DIAGNOSIS — E785 Hyperlipidemia, unspecified: Secondary | ICD-10-CM | POA: Diagnosis not present

## 2015-03-21 DIAGNOSIS — I10 Essential (primary) hypertension: Secondary | ICD-10-CM | POA: Insufficient documentation

## 2015-03-21 DIAGNOSIS — C250 Malignant neoplasm of head of pancreas: Secondary | ICD-10-CM

## 2015-03-21 DIAGNOSIS — Z95828 Presence of other vascular implants and grafts: Secondary | ICD-10-CM

## 2015-03-21 DIAGNOSIS — F419 Anxiety disorder, unspecified: Secondary | ICD-10-CM | POA: Diagnosis not present

## 2015-03-21 DIAGNOSIS — R4182 Altered mental status, unspecified: Secondary | ICD-10-CM | POA: Diagnosis present

## 2015-03-21 DIAGNOSIS — Z5111 Encounter for antineoplastic chemotherapy: Secondary | ICD-10-CM

## 2015-03-21 DIAGNOSIS — F329 Major depressive disorder, single episode, unspecified: Secondary | ICD-10-CM | POA: Insufficient documentation

## 2015-03-21 LAB — DIFFERENTIAL
Basophils Absolute: 0 10*3/uL (ref 0.0–0.1)
Basophils Relative: 0 % (ref 0–1)
Eosinophils Absolute: 0 10*3/uL (ref 0.0–0.7)
Eosinophils Relative: 0 % (ref 0–5)
LYMPHS ABS: 0.2 10*3/uL — AB (ref 0.7–4.0)
LYMPHS PCT: 6 % — AB (ref 12–46)
Monocytes Absolute: 0 10*3/uL — ABNORMAL LOW (ref 0.1–1.0)
Monocytes Relative: 1 % — ABNORMAL LOW (ref 3–12)
NEUTROS ABS: 3.7 10*3/uL (ref 1.7–7.7)
NEUTROS PCT: 93 % — AB (ref 43–77)

## 2015-03-21 LAB — CBC WITH DIFFERENTIAL/PLATELET
BASO%: 2.1 % — AB (ref 0.0–2.0)
Basophils Absolute: 0.1 10*3/uL (ref 0.0–0.1)
EOS%: 2.3 % (ref 0.0–7.0)
Eosinophils Absolute: 0.1 10*3/uL (ref 0.0–0.5)
HCT: 35.2 % (ref 34.8–46.6)
HEMOGLOBIN: 11.3 g/dL — AB (ref 11.6–15.9)
LYMPH%: 9.6 % — AB (ref 14.0–49.7)
MCH: 29 pg (ref 25.1–34.0)
MCHC: 32 g/dL (ref 31.5–36.0)
MCV: 90.6 fL (ref 79.5–101.0)
MONO#: 0.5 10*3/uL (ref 0.1–0.9)
MONO%: 11.8 % (ref 0.0–14.0)
NEUT#: 3.4 10*3/uL (ref 1.5–6.5)
NEUT%: 74.2 % (ref 38.4–76.8)
PLATELETS: 442 10*3/uL — AB (ref 145–400)
RBC: 3.89 10*6/uL (ref 3.70–5.45)
RDW: 20.7 % — ABNORMAL HIGH (ref 11.2–14.5)
WBC: 4.6 10*3/uL (ref 3.9–10.3)
lymph#: 0.4 10*3/uL — ABNORMAL LOW (ref 0.9–3.3)

## 2015-03-21 LAB — COMPREHENSIVE METABOLIC PANEL (CC13)
ALT: 27 U/L (ref 0–55)
ANION GAP: 13 meq/L — AB (ref 3–11)
AST: 43 U/L — ABNORMAL HIGH (ref 5–34)
Albumin: 3.2 g/dL — ABNORMAL LOW (ref 3.5–5.0)
Alkaline Phosphatase: 214 U/L — ABNORMAL HIGH (ref 40–150)
BILIRUBIN TOTAL: 0.55 mg/dL (ref 0.20–1.20)
BUN: 10.4 mg/dL (ref 7.0–26.0)
CO2: 25 mEq/L (ref 22–29)
CREATININE: 0.8 mg/dL (ref 0.6–1.1)
Calcium: 9.2 mg/dL (ref 8.4–10.4)
Chloride: 106 mEq/L (ref 98–109)
EGFR: 81 mL/min/{1.73_m2} — AB (ref >90)
GLUCOSE: 113 mg/dL (ref 70–140)
Potassium: 3.3 mEq/L — ABNORMAL LOW (ref 3.5–5.1)
Sodium: 145 mEq/L (ref 136–145)
Total Protein: 6.8 g/dL (ref 6.4–8.3)

## 2015-03-21 LAB — CBC
HCT: 35.4 % — ABNORMAL LOW (ref 36.0–46.0)
Hemoglobin: 11.1 g/dL — ABNORMAL LOW (ref 12.0–15.0)
MCH: 29.1 pg (ref 26.0–34.0)
MCHC: 31.4 g/dL (ref 30.0–36.0)
MCV: 92.9 fL (ref 78.0–100.0)
PLATELETS: 447 10*3/uL — AB (ref 150–400)
RBC: 3.81 MIL/uL — ABNORMAL LOW (ref 3.87–5.11)
RDW: 19 % — AB (ref 11.5–15.5)
WBC: 4 10*3/uL (ref 4.0–10.5)

## 2015-03-21 LAB — COMPREHENSIVE METABOLIC PANEL
ALT: 27 U/L (ref 0–35)
AST: 44 U/L — AB (ref 0–37)
Albumin: 3.2 g/dL — ABNORMAL LOW (ref 3.5–5.2)
Alkaline Phosphatase: 179 U/L — ABNORMAL HIGH (ref 39–117)
Anion gap: 9 (ref 5–15)
BUN: 11 mg/dL (ref 6–23)
CO2: 23 mmol/L (ref 19–32)
CREATININE: 0.85 mg/dL (ref 0.50–1.10)
Calcium: 8.7 mg/dL (ref 8.4–10.5)
Chloride: 106 mmol/L (ref 96–112)
GFR calc non Af Amer: 70 mL/min — ABNORMAL LOW (ref >90)
GFR, EST AFRICAN AMERICAN: 82 mL/min — AB (ref >90)
Glucose, Bld: 154 mg/dL — ABNORMAL HIGH (ref 70–99)
Potassium: 3 mmol/L — ABNORMAL LOW (ref 3.5–5.1)
Sodium: 138 mmol/L (ref 135–145)
Total Bilirubin: 0.6 mg/dL (ref 0.3–1.2)
Total Protein: 6.7 g/dL (ref 6.0–8.3)

## 2015-03-21 LAB — I-STAT TROPONIN, ED: TROPONIN I, POC: 0 ng/mL (ref 0.00–0.08)

## 2015-03-21 LAB — PROTIME-INR
INR: 1.15 (ref 0.00–1.49)
Prothrombin Time: 14.8 seconds (ref 11.6–15.2)

## 2015-03-21 LAB — APTT: aPTT: 32 seconds (ref 24–37)

## 2015-03-21 MED ORDER — SODIUM CHLORIDE 0.9 % IV SOLN
Freq: Once | INTRAVENOUS | Status: AC
  Administered 2015-03-21: 12:00:00 via INTRAVENOUS

## 2015-03-21 MED ORDER — LEUCOVORIN CALCIUM INJECTION 350 MG
400.0000 mg/m2 | Freq: Once | INTRAVENOUS | Status: AC
  Administered 2015-03-21: 720 mg via INTRAVENOUS
  Filled 2015-03-21: qty 36

## 2015-03-21 MED ORDER — HEPARIN SOD (PORK) LOCK FLUSH 100 UNIT/ML IV SOLN
500.0000 [IU] | Freq: Once | INTRAVENOUS | Status: AC | PRN
  Administered 2015-03-21: 500 [IU]
  Filled 2015-03-21: qty 5

## 2015-03-21 MED ORDER — METHYLPREDNISOLONE SODIUM SUCC 125 MG IJ SOLR
125.0000 mg | Freq: Once | INTRAMUSCULAR | Status: AC
  Administered 2015-03-21: 125 mg via INTRAVENOUS

## 2015-03-21 MED ORDER — DEXTROSE 5 % IV SOLN
180.0000 mg/m2 | Freq: Once | INTRAVENOUS | Status: AC
  Administered 2015-03-21: 324 mg via INTRAVENOUS
  Filled 2015-03-21: qty 16.2

## 2015-03-21 MED ORDER — ATROPINE SULFATE 1 MG/ML IJ SOLN
INTRAMUSCULAR | Status: AC
Start: 2015-03-21 — End: 2015-03-21
  Filled 2015-03-21: qty 1

## 2015-03-21 MED ORDER — SODIUM CHLORIDE 0.9 % IV SOLN
2400.0000 mg/m2 | INTRAVENOUS | Status: DC
  Filled 2015-03-21: qty 86

## 2015-03-21 MED ORDER — FAMOTIDINE IN NACL 20-0.9 MG/50ML-% IV SOLN
20.0000 mg | Freq: Once | INTRAVENOUS | Status: AC
  Administered 2015-03-21: 20 mg via INTRAVENOUS

## 2015-03-21 MED ORDER — SODIUM CHLORIDE 0.9 % IV SOLN
Freq: Once | INTRAVENOUS | Status: AC
  Administered 2015-03-21: 13:00:00 via INTRAVENOUS
  Filled 2015-03-21: qty 8

## 2015-03-21 MED ORDER — DIPHENHYDRAMINE HCL 50 MG/ML IJ SOLN
50.0000 mg | Freq: Once | INTRAMUSCULAR | Status: AC
  Administered 2015-03-21: 50 mg via INTRAVENOUS

## 2015-03-21 MED ORDER — SODIUM CHLORIDE 0.9 % IJ SOLN
10.0000 mL | INTRAMUSCULAR | Status: DC | PRN
  Administered 2015-03-21: 10 mL via INTRAVENOUS
  Filled 2015-03-21: qty 10

## 2015-03-21 MED ORDER — POTASSIUM CHLORIDE CRYS ER 20 MEQ PO TBCR
40.0000 meq | EXTENDED_RELEASE_TABLET | Freq: Once | ORAL | Status: AC
  Administered 2015-03-21: 40 meq via ORAL
  Filled 2015-03-21: qty 2

## 2015-03-21 MED ORDER — ATROPINE SULFATE 1 MG/ML IJ SOLN
0.5000 mg | Freq: Once | INTRAMUSCULAR | Status: AC | PRN
  Administered 2015-03-21: 0.5 mg via INTRAVENOUS

## 2015-03-21 MED ORDER — DEXTROSE 5 % IV SOLN
Freq: Once | INTRAVENOUS | Status: AC
  Administered 2015-03-21: 13:00:00 via INTRAVENOUS

## 2015-03-21 MED ORDER — HEPARIN SOD (PORK) LOCK FLUSH 100 UNIT/ML IV SOLN
500.0000 [IU] | Freq: Once | INTRAVENOUS | Status: AC
  Administered 2015-03-21: 500 [IU] via INTRAVENOUS
  Filled 2015-03-21: qty 5

## 2015-03-21 MED ORDER — OXALIPLATIN CHEMO INJECTION 100 MG/20ML
85.0000 mg/m2 | Freq: Once | INTRAVENOUS | Status: AC
  Administered 2015-03-21: 155 mg via INTRAVENOUS
  Filled 2015-03-21: qty 31

## 2015-03-21 MED ORDER — SODIUM CHLORIDE 0.9 % IJ SOLN
10.0000 mL | INTRAMUSCULAR | Status: DC | PRN
  Administered 2015-03-21: 10 mL
  Filled 2015-03-21: qty 10

## 2015-03-21 MED ORDER — HEPARIN SOD (PORK) LOCK FLUSH 100 UNIT/ML IV SOLN
500.0000 [IU] | Freq: Once | INTRAVENOUS | Status: AC
  Administered 2015-03-21: 500 [IU]
  Filled 2015-03-21: qty 5

## 2015-03-21 NOTE — Patient Instructions (Signed)
[facility] Discharge Instructions for Patients Receiving Chemotherapy  Today you received the following chemotherapy agents: Oxaliplatin, Irinotecan, Leucovorin, Adrucil (5FU push/pump)  To help prevent nausea and vomiting after your treatment, we encourage you to take your nausea medication as prescribed by your physician.   If you develop nausea and vomiting that is not controlled by your nausea medication, call the clinic.   BELOW ARE SYMPTOMS THAT SHOULD BE REPORTED IMMEDIATELY:  *FEVER GREATER THAN 100.5 F  *CHILLS WITH OR WITHOUT FEVER  NAUSEA AND VOMITING THAT IS NOT CONTROLLED WITH YOUR NAUSEA MEDICATION  *UNUSUAL SHORTNESS OF BREATH  *UNUSUAL BRUISING OR BLEEDING  TENDERNESS IN MOUTH AND THROAT WITH OR WITHOUT PRESENCE OF ULCERS  *URINARY PROBLEMS  *BOWEL PROBLEMS  UNUSUAL RASH Items with * indicate a potential emergency and should be followed up as soon as possible.  Feel free to call the clinic you have any questions or concerns. The clinic phone number is [phone].  Please show the [facility] at check-in to the Emergency Department and triage nurse.

## 2015-03-21 NOTE — Progress Notes (Signed)
SYMPTOM MANAGEMENT CLINIC   HPI: Sonya Larson 64 y.o. female diagnosed with pancreatic cancer.  Patient is status post Whipple surgery and gemcitabine chemotherapy.  Here today to initiate Folfirinox chemotherapy.  Patient presented to the [facility] today to receive her first cycle of Folfirinox chemotherapy.  Patient completed all of her chemotherapy today; with the exception of the 5-FU pump initiation-with no difficulties whatsoever other than some mild stomach cramping.  Atropine was given for stomach cramps.  Just prior to initiating the chemotherapy pump-patient developed some expressive aphasia; with complaint of tongue numbness.  Patient states that she was biting her tongue due to the numbness.  Patient had no other weakness or neurological symptoms whatsoever.  After consulting with Dr. Alvy Bimler on call physician-administered hypersensitivity reaction medications which included Benadryl 50 mg IV, Pepcid 20 mg IV, and site Medrol 125 mg IV.  Patient was very closely monitored for approximately one hour; with only a mild improvement in patient's symptoms.  Vital signs remained essentially stable; with the exception of blood pressure which was elevated; but baseline for the patient.  Decision was made to transport patient to the emergency department for further evaluation and management.  Brief history and report were called to the emergency department charge nurse Delsa Sale prior to transporting patient to the emergency department via wheelchair per the [facility].   HPI  ROS  Past Medical History  Diagnosis Date  . Chicken pox   . Depression   . Hypertension   . Hyperlipidemia   . Polyp of colon   . Shingles JUNE 2015    RIGHT  SHOULDER  . Anxiety     since diagnosis  . Allergy   . Hx of radiation therapy 08/10/14-09/21/14    pancreas 50.4Gy/5f  . Asthma     exercise induced, seasonal   . GERD (gastroesophageal reflux disease)     uses TUM  PRN, since  chemo-   . Arthritis     mild, hips   . Status post PICC central line placement     10/2014  . Pancreatic cancer 07/12/14    Adenocarcinoma. Chemo/ radiation completed early 9'15-Dr. Sherril follows    Past Surgical History  Procedure Laterality Date  . Breast reduction surgery Bilateral   . Colonscopy   LAST DONE SEPT 2014    X 3  . Hysteroscopy  2005    "heavy menopause"  . Eus N/A 07/12/2014    Procedure: ESOPHAGEAL ENDOSCOPIC ULTRASOUND (EUS) RADIAL;  Surgeon: WArta Silence MD;  Location: [facility];  Service: Endoscopy;  Laterality: N/A;  . Fine needle aspiration N/A 07/12/2014    Procedure: FINE NEEDLE ASPIRATION (FNA) RADIAL;  Surgeon: WArta Silence MD;  Location: [facility];  Service: Endoscopy;  Laterality: N/A;  . Laparoscopy N/A 07/24/2014    Procedure: LAPAROSCOPY DIAGNOSTIC;  Surgeon: FStark Klein MD;  Location: [facility]  Service: General;  Laterality: N/A;-"no unusual fingings"  . Eus N/A 10/25/2014    Procedure: ESOPHAGEAL ENDOSCOPIC ULTRASOUND (EUS) RADIAL;  Surgeon: WArta Silence MD;  Location: [facility];  Service: Endoscopy;  Laterality: N/A;  . Fine needle aspiration N/A 10/25/2014    Procedure: FINE NEEDLE ASPIRATION (FNA) LINEAR;  Surgeon: WArta Silence MD;  Location: [facility];  Service: Endoscopy;  Laterality: N/A;  . Laparoscopy N/A 11/02/2014    Procedure: LAPAROSCOPY DIAGNOSTIC;  Surgeon: FStark Klein MD;  [facility]  Service: General;  Laterality: N/A;  . Whipple procedure N/A 11/02/2014    Procedure: WHIPPLE PROCEDURE;  Surgeon: Stark Klein, MD;  Location: [facility];  Service: General;  Laterality: N/A;  . Portacath placement Left 12/13/2014    Procedure: INSERTION PORT-A-CATH;  Surgeon: Stark Klein, MD;  Location: [facility];  Service: General;  Laterality: Left;    has Asthma, persistent controlled; Osteoarthritis; History of depression; Essential hypertension, benign; Hyperlipidemia; Personal history of colonic polyps;  Obesity (BMI 30-39.9); Adenocarcinoma of uncinate process of pancreas; Protein-calorie malnutrition, severe; HCAP (healthcare-associated pneumonia); Asthma; Blood poisoning; Sepsis; Antineoplastic chemotherapy induced pancytopenia; Fever; and Hypersensitivity reaction on her problem list.    has No Known Allergies.    Medication List       This list is accurate as of: 03/21/15  7:01 PM.  Always use your most recent med list.               atorvastatin 10 MG tablet  Commonly known as:  LIPITOR  TAKE 1 TABLET BY MOUTH EVERY DAY     budesonide-formoterol 160-4.5 MCG/ACT inhaler  Commonly known as:  SYMBICORT  Inhale 1 puff into the lungs daily as needed (asthma symptoms).     calcium carbonate 500 MG chewable tablet  Commonly known as:  TUMS - dosed in mg elemental calcium  Chew 2 tablets by mouth daily as needed for indigestion or heartburn.     cetirizine 10 MG tablet  Commonly known as:  ZYRTEC  Take 10 mg by mouth daily as needed for allergies or rhinitis.     hydrochlorothiazide 25 MG tablet  Commonly known as:  HYDRODIURIL  TAKE 1 TABLET BY MOUTH EVERY DAY     ibuprofen 200 MG tablet  Commonly known as:  ADVIL,MOTRIN  Take 600 mg by mouth 2 (two) times daily as needed (pain).     lidocaine-prilocaine cream  Commonly known as:  EMLA  Apply 1 application topically as needed. Apply to port 1-2 hours prior to stick and cover with plastic wrap     lisinopril 40 MG tablet  Commonly known as:  PRINIVIL,ZESTRIL  TAKE 1 TABLET BY MOUTH EVERY DAY     LORazepam 0.5 MG tablet  Commonly known as:  ATIVAN  TAKE 1 TABLET BY MOUTH EVERY 6 TO 8 HOURS AS NEEDED SEVERE ANXIETY     ondansetron 8 MG tablet  Commonly known as:  ZOFRAN  Take 1 tablet (8 mg total) by mouth every 8 (eight) hours as needed for nausea or vomiting.     potassium chloride SA 20 MEQ tablet  Commonly known as:  K-DUR,KLOR-CON  Take 1 tablet (20 mEq total) by mouth 2 (two) times daily.     PRESCRIPTION  MEDICATION  Chemotherapy at [facility]     promethazine 12.5 MG tablet  Commonly known as:  PHENERGAN  Take 1/2 tablet to 1 tablet every 6 hours as needed for nausea         PHYSICAL EXAMINATION  Oncology Vitals 03/21/2015 03/21/2015 03/21/2015 03/16/2015 03/13/2015 03/13/2015 03/07/2015  Height - - - 160 cm 160 cm - 160 cm  Weight - - - 72.576 kg 73.211 kg 73.347 kg 73.12 kg  Weight (lbs) - - - 160 lbs 161 lbs 6 oz 161 lbs 11 oz 161 lbs 3 oz  BMI (kg/m2) - - - 28.34 kg/m2 28.59 kg/m2 - 28.56 kg/m2  Temp 98.1 - 98.6 98.1 97.8 98.2 98.3  Pulse 82 63 78 80 72 83 73  Resp _0 - 20  SpO2 98 99 98 99 99 98 97  BSA (  m2) - - - 1.8 m2 1.8 m2 - 1.8 m2   BP Readings from Last 3 Encounters:  03/21/15 154/88  03/21/15 185/89  03/16/15 158/85    Physical Exam  Constitutional: She is oriented to person, place, and time.  HENT:  Head: Normocephalic and atraumatic.  Mouth/Throat: Oropharynx is clear and moist.  Eyes: Conjunctivae and EOM are normal. Pupils are equal, round, and reactive to light. Right eye exhibits no discharge. Left eye exhibits no discharge. No scleral icterus.  Neck: Normal range of motion. Neck supple. No JVD present. No tracheal deviation present. No thyromegaly present.  Cardiovascular: Normal rate, regular rhythm, normal heart sounds and intact distal pulses.   Pulmonary/Chest: Effort normal. No stridor. No respiratory distress.  Abdominal: Soft. Bowel sounds are normal.  Musculoskeletal: Normal range of motion. She exhibits no edema or tenderness.  Lymphadenopathy:    She has no cervical adenopathy.  Neurological: She is alert and oriented to person, place, and time.  Patient with marked expressive aphasia.  Patient appears to have some difficulty forming her words.  No obvious weakness or other neurological deficit on exam.  Skin: Skin is warm and dry. No rash noted. No erythema. There is pallor.  Psychiatric: Affect normal.  Nursing note and vitals  reviewed.   LABORATORY DATA:. Appointment on 03/21/2015  Component Date Value Ref Range Status  . WBC 03/21/2015 4.6  3.9 - 10.3 10e3/uL Final  . NEUT# 03/21/2015 3.4  1.5 - 6.5 10e3/uL Final  . HGB 03/21/2015 11.3* 11.6 - 15.9 g/dL Final  . HCT 03/21/2015 35.2  34.8 - 46.6 % Final  . Platelets 03/21/2015 442* 145 - 400 10e3/uL Final  . MCV 03/21/2015 90.6  79.5 - 101.0 fL Final  . MCH 03/21/2015 29.0  25.1 - 34.0 pg Final  . MCHC 03/21/2015 32.0  31.5 - 36.0 g/dL Final  . RBC 03/21/2015 3.89  3.70 - 5.45 10e6/uL Final  . RDW 03/21/2015 20.7* 11.2 - 14.5 % Final  . lymph# 03/21/2015 0.4* 0.9 - 3.3 10e3/uL Final  . MONO# 03/21/2015 0.5  0.1 - 0.9 10e3/uL Final  . Eosinophils Absolute 03/21/2015 0.1  0.0 - 0.5 10e3/uL Final  . Basophils Absolute 03/21/2015 0.1  0.0 - 0.1 10e3/uL Final  . NEUT% 03/21/2015 74.2  38.4 - 76.8 % Final  . LYMPH% 03/21/2015 9.6* 14.0 - 49.7 % Final  . MONO% 03/21/2015 11.8  0.0 - 14.0 % Final  . EOS% 03/21/2015 2.3  0.0 - 7.0 % Final  . BASO% 03/21/2015 2.1* 0.0 - 2.0 % Final  . Sodium 03/21/2015 145  136 - 145 mEq/L Final  . Potassium 03/21/2015 3.3* 3.5 - 5.1 mEq/L Final  . Chloride 03/21/2015 106  98 - 109 mEq/L Final  . CO2 03/21/2015 25  22 - 29 mEq/L Final  . Glucose 03/21/2015 113  70 - 140 mg/dl Final  . BUN 03/21/2015 10.4  7.0 - 26.0 mg/dL Final  . Creatinine 03/21/2015 0.8  0.6 - 1.1 mg/dL Final  . Total Bilirubin 03/21/2015 0.55  0.20 - 1.20 mg/dL Final  . Alkaline Phosphatase 03/21/2015 214* 40 - 150 U/L Final  . AST 03/21/2015 43* 5 - 34 U/L Final  . ALT 03/21/2015 27  0 - 55 U/L Final  . Total Protein 03/21/2015 6.8  6.4 - 8.3 g/dL Final  . Albumin 03/21/2015 3.2* 3.5 - 5.0 g/dL Final  . Calcium 03/21/2015 9.2  8.4 - 10.4 mg/dL Final  . Anion Gap 03/21/2015 13* 3 - 11  mEq/L Final  . EGFR 03/21/2015 81* >90 ml/min/1.73 m2 Final   eGFR is calculated using the CKD-EPI Creatinine Equation (2009)     RADIOGRAPHIC STUDIES: No results  found.  ASSESSMENT/PLAN:    Adenocarcinoma of uncinate process of pancreas Patient is status post Whipple surgery and gemcitabine chemotherapy.  Recent restaging  scans revealed progression of her disease. Patient presented to the [facility] today to receive her first cycle of Folfirinox chemotherapy.  Patient completed all of her chemotherapy today; with the exception of the 5-FU pump initiation-with no difficulties whatsoever other than some mild stomach cramping.  Atropine was given for stomach cramps.  Just prior to initiating the chemotherapy pump-patient developed some expressive aphasia; with complaint of tongue numbness.  Patient states that she was biting her tongue due to the numbness.  Patient had no other weakness or neurological symptoms whatsoever.  After consulting with Dr. Alvy Bimler on call physician-administered hypersensitivity reaction medications which included Benadryl 50 mg IV, Pepcid 20 mg IV, and site Medrol 125 mg IV.  Patient was very closely monitored for approximately one hour; with only a mild improvement in patient's symptoms.  Vital signs remained essentially stable; with the exception of blood pressure which was elevated; but baseline for the patient.  Decision was made to transport patient to the emergency department for further evaluation and management.  Brief history and report were called to the emergency department charge nurse Delsa Sale prior to transporting patient to the emergency department via wheelchair per the [facility].   Hypersensitivity reaction Please see previous note regarding hypersensitivity reaction.     Patient stated understanding of all instructions; and was in agreement with this plan of care. The patient knows to call the clinic with any problems, questions or concerns.   Review/collaboration with Dr. Alvy Bimler (on call MD)  regarding all aspects of patient's visit today.   Total time spent with patient was 40 minutes;  with  greater than 75 percent of that time spent in face to face counseling regarding patient's symptoms,  and coordination of care and follow up.  Disclaimer: This note was dictated with voice recognition software. Similar sounding words can inadvertently be transcribed and may not be corrected upon review.   Drue Second, NP 03/21/2015

## 2015-03-21 NOTE — ED Notes (Signed)
She is brought to [place] accompanied by her husband and two of our [facility].  They state pt. Received a chemotherapy agent today during which pt. Developed a rather marked expressive aphasia.  This expressive aphasia according the the nurses and pt's. Husband is "much improved".  She is able to answer questions (albeit a bit hesitantly) and her facial muscle movements are symmetrical.  Also PEARL at 68mm, with bilat. Handgrips being equal.  Her skin is a bit pale, warm and dry and she is breathing normally.

## 2015-03-21 NOTE — Progress Notes (Signed)
1715-checking on pt . Normal saline flush infusing . I was getting ready to hook her up to 53fu pump and her speech is slow and she states she cannot get her tongue out of the way and is biting it. Her husband concurs that her speech is affected. She is in no acute distress. BP 185/89. Pulse 63, oxygen sat 99%, Cyndee Bacon in to evaluate pt.  hypersensitivity meds given iv with normal saline infusing .

## 2015-03-21 NOTE — Progress Notes (Signed)
1845-Pt still complaining of tingling to lips and difficulty forming words and speaking.  Pt.'s husband at chairside.  Order received to transfer pt to the emergency room from C. Berniece Salines NP.  Pt transferred via wheelchair to ER by V. Tobey RN and D. Placke RN.  Hand off given to [employer] in ED.  Report called to ER charge nurse-Jen by C. Berniece Salines NP.

## 2015-03-21 NOTE — Assessment & Plan Note (Signed)
Please see previous note regarding hypersensitivity reaction.

## 2015-03-21 NOTE — Discharge Instructions (Signed)
Aphasia Aphasia is a neurological disorder caused by damage to the parts of the brain that control language. CAUSES  Aphasia is not a disease, but a symptom of brain damage. Aphasia is commonly seen in adults who have suffered a stroke. Aphasia also can result from:  A brain tumor.  Infection.  Head injury.  A rare type of dementia called Primary Progressive Aphasia. Common types of dementia may be associated with aphasia but can also exist without language problems. SYMPTOMS  Primary signs of the disorder include:  Problems expressing oneself when speaking.  Trouble understanding speech.  Difficulty with reading and writing.  Speaking in short or incomplete sentences.  Speaking in sentences that don't make sense.  Speaking unrecognizable words.  Interpreting figurative language literally.  Writing sentences that don't make sense. The type and severity of language problems depend on the precise location and extent of the damaged brain tissue. Aphasia can be divided into four broad categories:  Expressive aphasia - difficulty in conveying thoughts through speech or writing. The patient knows what they want to say, but cannot find the words they need.  Receptive aphasia - difficulty understanding spoken or written language. The patient hears the voice or sees the print but cannot make sense of the words.  Anomic or amnesia aphasia - difficulty in using the correct names for particular objects, people, places, or events. This is the least severe form of aphasia.  Global aphasia results from severe and extensive damage to the language areas of the brain. Patients lose almost all language function, both comprehension (understanding) and expression. They cannot speak, understand speech, read, or write. TREATMENT  Sometimes an individual will completely recover from aphasia without treatment. In most cases, language therapy should begin as soon as possible. Language therapy should  be tailored to the individual needs of the patient. Therapy with a speech pathologist involves exercises in which patients:  Read.  Write.  Follow directions.  Repeat what they hear.  Computer-aided therapy may also be used. PROGNOSIS  The outcome of aphasia is difficult to predict. People who are younger or have less extensive brain damage do better. The location of the injury is also important. The location is a clue to prognosis. In general, patients tend to recover skills in language comprehension (understanding) more completely than those skills involving expression (speaking or writing). Document Released: 08/09/2002 Document Revised: 02/09/2012 Document Reviewed: 02/06/2014 [employer] [facility] Patient Information 2015 [place], [place]. This information is not intended to replace advice given to you by your health care provider. Make sure you discuss any questions you have with your health care provider.

## 2015-03-21 NOTE — ED Provider Notes (Signed)
CSN: [id number]     Arrival date & time 03/21/15  1901 History   First MD Initiated Contact with Patient 03/21/15 1929     Chief Complaint  Patient presents with  . Altered Mental Status    HPI Pt was getting chemotherapy (oxaplatinin) today from noon to around 5pm for pancreatic ca.  She seemed to be speaking normally until 5pm when she tried to speak and she was having trouble with her words.  They stated that she had a marked expressive aphasia .  They thought it could be related to her medications initially and she was given   solumedrol and antihistamines.  Her symptoms persisted so she was sent to the ED.  The symptoms are significantly improved and in fact resolved now. Past Medical History  Diagnosis Date  . Chicken pox   . Depression   . Hypertension   . Hyperlipidemia   . Polyp of colon   . Shingles JUNE 2015    RIGHT  SHOULDER  . Anxiety     since diagnosis  . Allergy   . Hx of radiation therapy 08/10/14-09/21/14    pancreas 50.4Gy/68fx  . Asthma     exercise induced, seasonal   . GERD (gastroesophageal reflux disease)     uses TUM  PRN, since chemo-   . Arthritis     mild, hips   . Status post PICC central line placement     10/2014  . Pancreatic cancer 07/12/14    Adenocarcinoma. Chemo/ radiation completed early 9'15-Dr. Sherril follows   Past Surgical History  Procedure Laterality Date  . Breast reduction surgery Bilateral   . Colonscopy   LAST DONE SEPT 2014    X 3  . Hysteroscopy  2005    "heavy menopause"  . Eus N/A 07/12/2014    Procedure: ESOPHAGEAL ENDOSCOPIC ULTRASOUND (EUS) RADIAL;  Surgeon: Arta Silence, MD;  Location: [facility];  Service: Endoscopy;  Laterality: N/A;  . Fine needle aspiration N/A 07/12/2014    Procedure: FINE NEEDLE ASPIRATION (FNA) RADIAL;  Surgeon: Arta Silence, MD;  Location: [facility];  Service: Endoscopy;  Laterality: N/A;  . Laparoscopy N/A 07/24/2014    Procedure: LAPAROSCOPY DIAGNOSTIC;  Surgeon: Stark Klein, MD;   Location: [place];  Service: General;  Laterality: N/A;-"no unusual fingings"  . Eus N/A 10/25/2014    Procedure: ESOPHAGEAL ENDOSCOPIC ULTRASOUND (EUS) RADIAL;  Surgeon: Arta Silence, MD;  Location: [facility];  Service: Endoscopy;  Laterality: N/A;  . Fine needle aspiration N/A 10/25/2014    Procedure: FINE NEEDLE ASPIRATION (FNA) LINEAR;  Surgeon: Arta Silence, MD;  Location: [facility];  Service: Endoscopy;  Laterality: N/A;  . Laparoscopy N/A 11/02/2014    Procedure: LAPAROSCOPY DIAGNOSTIC;  Surgeon: Stark Klein, MD;  Location: [place];  Service: General;  Laterality: N/A;  . Whipple procedure N/A 11/02/2014    Procedure: WHIPPLE PROCEDURE;  Surgeon: Stark Klein, MD;  Location: [place];  Service: General;  Laterality: N/A;  . Portacath placement Left 12/13/2014    Procedure: INSERTION PORT-A-CATH;  Surgeon: Stark Klein, MD;  Location: [place];  Service: General;  Laterality: Left;   Family History  Problem Relation Age of Onset  . Arthritis Mother   . Hypertension Mother   . Alcohol abuse Father   . Cancer Father     brain tumor  . Cancer Cousin     breast ca,    History  Substance Use Topics  . Smoking status: Never Smoker   . Smokeless tobacco:  Never Used  . Alcohol Use: Yes     Comment: VERY RARE   OB History    No data available     Review of Systems  All other systems reviewed and are negative.     Allergies  Review of patient's allergies indicates no known allergies.  Home Medications   Prior to Admission medications   Medication Sig Start Date End Date Taking? Authorizing Provider  atorvastatin (LIPITOR) 10 MG tablet TAKE 1 TABLET BY MOUTH EVERY DAY 09/18/14  Yes Eulas Post, MD  calcium carbonate (TUMS - DOSED IN MG ELEMENTAL CALCIUM) 500 MG chewable tablet Chew 1 tablet by mouth daily as needed for indigestion or heartburn (heartburn).    Yes Historical Provider, MD  hydrochlorothiazide (HYDRODIURIL) 25 MG tablet TAKE 1 TABLET BY  MOUTH EVERY DAY 09/18/14  Yes Eulas Post, MD  ibuprofen (ADVIL,MOTRIN) 200 MG tablet Take 400 mg by mouth 2 (two) times daily as needed for moderate pain (pain).    Yes Historical Provider, MD  lidocaine-prilocaine (EMLA) cream Apply 1 application topically as needed. Apply to port 1-2 hours prior to stick and cover with plastic wrap 12/19/14  Yes Ladell Pier, MD  lisinopril (PRINIVIL,ZESTRIL) 40 MG tablet TAKE 1 TABLET BY MOUTH EVERY DAY 09/18/14  Yes Eulas Post, MD  LORazepam (ATIVAN) 0.5 MG tablet TAKE 1 TABLET BY MOUTH EVERY 6 TO 8 HOURS AS NEEDED SEVERE ANXIETY 01/18/15  Yes Eulas Post, MD  ondansetron (ZOFRAN) 8 MG tablet Take 1 tablet (8 mg total) by mouth every 8 (eight) hours as needed for nausea or vomiting. 11/27/14  Yes Ladell Pier, MD  potassium chloride SA (K-DUR,KLOR-CON) 20 MEQ tablet Take 1 tablet (20 mEq total) by mouth 2 (two) times daily. Patient taking differently: Take 20 mEq by mouth daily.  02/21/15  Yes Ladell Pier, MD  PRESCRIPTION MEDICATION Chemotherapy at [facility]   Yes Historical Provider, MD  promethazine (PHENERGAN) 12.5 MG tablet Take 1/2 tablet to 1 tablet every 6 hours as needed for nausea 11/27/14   Ladell Pier, MD   BP 149/89 mmHg  Pulse 76  Temp(Src) 97.8 F (36.6 C) (Oral)  Resp 15  SpO2 98%  LMP 10/31/2005 Physical Exam  Constitutional: She is oriented to person, place, and time. She appears well-developed and well-nourished. No distress.  HENT:  Head: Normocephalic and atraumatic.  Right Ear: External ear normal.  Left Ear: External ear normal.  Mouth/Throat: Oropharynx is clear and moist.  Eyes: Conjunctivae are normal. Right eye exhibits no discharge. Left eye exhibits no discharge. No scleral icterus.  Neck: Neck supple. No tracheal deviation present.  Cardiovascular: Normal rate, regular rhythm and intact distal pulses.   Pulmonary/Chest: Effort normal and breath sounds normal. No stridor. No respiratory distress.  She has no wheezes. She has no rales.  Abdominal: Soft. Bowel sounds are normal. She exhibits no distension. There is no tenderness. There is no rebound and no guarding.  Musculoskeletal: She exhibits no edema or tenderness.  Neurological: She is alert and oriented to person, place, and time. She has normal strength. No cranial nerve deficit (No facial droop, extraocular movements intact, tongue midline ) or sensory deficit. She exhibits normal muscle tone. She displays no seizure activity. Coordination normal.  No pronator drift bilateral upper extrem, able to hold both legs off bed for 5 seconds, sensation intact in all extremities, no visual field cuts, no left or right sided neglect, normal finger-nose exam bilaterally, no nystagmus noted  NIHSS = 0  Skin: Skin is warm and dry. No rash noted.  Psychiatric: She has a normal mood and affect.  Nursing note and vitals reviewed.   ED Course  Procedures (including critical care time) Labs Review Labs Reviewed  CBC - Abnormal; Notable for the following:    RBC 3.81 (*)    Hemoglobin 11.1 (*)    HCT 35.4 (*)    RDW 19.0 (*)    Platelets 447 (*)    All other components within normal limits  DIFFERENTIAL - Abnormal; Notable for the following:    Neutrophils Relative % 93 (*)    Lymphocytes Relative 6 (*)    Lymphs Abs 0.2 (*)    Monocytes Relative 1 (*)    Monocytes Absolute 0.0 (*)    All other components within normal limits  COMPREHENSIVE METABOLIC PANEL - Abnormal; Notable for the following:    Potassium 3.0 (*)    Glucose, Bld 154 (*)    Albumin 3.2 (*)    AST 44 (*)    Alkaline Phosphatase 179 (*)    GFR calc non Af Amer 70 (*)    GFR calc Af Amer 82 (*)    All other components within normal limits  PROTIME-INR  APTT  I-STAT TROPOININ, ED    Imaging Review Ct Head Wo Contrast  03/21/2015   CLINICAL DATA:  Altered mental status, recent chemotherapy for pancreatic carcinoma  EXAM: CT HEAD WITHOUT CONTRAST  TECHNIQUE:  Contiguous axial images were obtained from the base of the skull through the vertex without intravenous contrast.  COMPARISON:  None.  FINDINGS: Bony calvarium is intact. No gross soft tissue abnormality is noted. No findings to suggest acute hemorrhage, acute infarction or space-occupying mass lesion are.  IMPRESSION: No acute abnormality noted.   Electronically Signed   By: Inez Catalina M.D.   On: 03/21/2015 20:50     MDM   Final diagnoses:  Speech complaints  Chemotherapy adverse reaction, initial encounter   The patient's chemotherapy agent may have caused some issues with paresthesias. Cannot completely exclude a TIA however the Initial workup in the emergency department is unremarkable. The patient's NIH stroke scale is 0 and her symptoms may very well have been related to the chemotherapy treatment.  I discussed admission for observation and further TIA workup versus close outpatient follow-up with her oncologist. Patient would prefer to go home and follow up with her doctor.    Dorie Rank, MD 03/21/15 2158

## 2015-03-21 NOTE — Patient Instructions (Signed)

## 2015-03-21 NOTE — Assessment & Plan Note (Signed)
Patient is status post Whipple surgery and gemcitabine chemotherapy.  Recent restaging  scans revealed progression of her disease. Patient presented to the [facility] today to receive her first cycle of Folfirinox chemotherapy.  Patient completed all of her chemotherapy today; with the exception of the 5-FU pump initiation-with no difficulties whatsoever other than some mild stomach cramping.  Atropine was given for stomach cramps.  Just prior to initiating the chemotherapy pump-patient developed some expressive aphasia; with complaint of tongue numbness.  Patient states that she was biting her tongue due to the numbness.  Patient had no other weakness or neurological symptoms whatsoever.  After consulting with Dr. Alvy Bimler on call physician-administered hypersensitivity reaction medications which included Benadryl 50 mg IV, Pepcid 20 mg IV, and site Medrol 125 mg IV.  Patient was very closely monitored for approximately one hour; with only a mild improvement in patient's symptoms.  Vital signs remained essentially stable; with the exception of blood pressure which was elevated; but baseline for the patient.  Decision was made to transport patient to the emergency department for further evaluation and management.  Brief history and report were called to the emergency department charge nurse Delsa Sale prior to transporting patient to the emergency department via wheelchair per the [facility].

## 2015-03-21 NOTE — ED Notes (Signed)
The chemo. Nurses state that this particular chemo. Combination sometimes produces "some strange neuro. Symptoms."

## 2015-03-21 NOTE — Progress Notes (Addendum)
1547- pt c/o stomach cramps; Irinotecan paused, atropine administered. 1604-pt reports feeling better, no concerns at this time. Irinotecan resumed at [facility]

## 2015-03-21 NOTE — ED Notes (Signed)
Nurse is going to excess the patient port.

## 2015-03-22 ENCOUNTER — Telehealth: Payer: Self-pay | Admitting: *Deleted

## 2015-03-22 ENCOUNTER — Ambulatory Visit (HOSPITAL_BASED_OUTPATIENT_CLINIC_OR_DEPARTMENT_OTHER): Payer: Medicare Other

## 2015-03-22 DIAGNOSIS — Z5111 Encounter for antineoplastic chemotherapy: Secondary | ICD-10-CM

## 2015-03-22 DIAGNOSIS — C257 Malignant neoplasm of other parts of pancreas: Secondary | ICD-10-CM

## 2015-03-22 DIAGNOSIS — C250 Malignant neoplasm of head of pancreas: Secondary | ICD-10-CM

## 2015-03-22 LAB — CANCER ANTIGEN 19-9: CA 19 9: 5354.9 U/mL — AB (ref <35.0)

## 2015-03-22 MED ORDER — SODIUM CHLORIDE 0.9 % IV SOLN
2400.0000 mg/m2 | INTRAVENOUS | Status: DC
  Administered 2015-03-22: 4300 mg via INTRAVENOUS
  Filled 2015-03-22: qty 86

## 2015-03-22 NOTE — Telephone Encounter (Signed)
Called pt to follow up after chemo/ ED visit on 4/20. She denies any recurrence of the oral numbness or aphasia. Dr. Benay Spice recommends she come in today for the 5 FU continuous infusion pump. Pt voiced understanding. Appt given for 2PM today. Explained Saturday pump disconnect and injection procedure. Pt understands to go directly to infusion room on Sat. Pharmacy notified.

## 2015-03-22 NOTE — Patient Instructions (Signed)
[facility] Discharge Instructions for Patients Receiving Chemotherapy  Today you received the following chemotherapy agents 5-FU pump.  To help prevent nausea and vomiting after your treatment, we encourage you to take your nausea medication as directed.    If you develop nausea and vomiting that is not controlled by your nausea medication, call the clinic.   BELOW ARE SYMPTOMS THAT SHOULD BE REPORTED IMMEDIATELY:  *FEVER GREATER THAN 100.5 F  *CHILLS WITH OR WITHOUT FEVER  NAUSEA AND VOMITING THAT IS NOT CONTROLLED WITH YOUR NAUSEA MEDICATION  *UNUSUAL SHORTNESS OF BREATH  *UNUSUAL BRUISING OR BLEEDING  TENDERNESS IN MOUTH AND THROAT WITH OR WITHOUT PRESENCE OF ULCERS  *URINARY PROBLEMS  *BOWEL PROBLEMS  UNUSUAL RASH Items with * indicate a potential emergency and should be followed up as soon as possible.  Feel free to call the clinic you have any questions or concerns. The clinic phone number is [phone].  Please show the [facility] at check-in to the Emergency Department and triage nurse.  Irinotecan injection What is this medicine? IRINOTECAN (ir in oh TEE kan ) is a chemotherapy drug. It is used to treat colon and rectal cancer. This medicine may be used for other purposes; ask your health care provider or pharmacist if you have questions. COMMON BRAND NAME(S): Camptosar What should I tell my health care provider before I take this medicine? They need to know if you have any of these conditions: -blood disorders -dehydration -diarrhea -infection (especially a virus infection such as chickenpox, cold sores, or herpes) -liver disease -low blood counts, like low white cell, platelet, or red cell counts -recent or ongoing radiation therapy -an unusual or allergic reaction to irinotecan, sorbitol, other chemotherapy, other medicines, foods, dyes, or preservatives -pregnant or trying to get pregnant -breast-feeding How should I use this  medicine? This drug is given as an infusion into a vein. It is administered in a hospital or clinic by a specially trained health care professional. Talk to your pediatrician regarding the use of this medicine in children. Special care may be needed. Overdosage: If you think you have taken too much of this medicine contact a poison control center or emergency room at once. NOTE: This medicine is only for you. Do not share this medicine with others. What if I miss a dose? It is important not to miss your dose. Call your doctor or health care professional if you are unable to keep an appointment. What may interact with this medicine? Do not take this medicine with any of the following medications: -atazanavir -certain medicines for fungal infections like itraconazole and ketoconazole -St. John's Wort This medicine may also interact with the following medications: -dexamethasone -diuretics -laxatives -medicines for seizures like carbamazepine, mephobarbital, phenobarbital, phenytoin, primidone -medicines to increase blood counts like filgrastim, pegfilgrastim, sargramostim -prochlorperazine -vaccines This list may not describe all possible interactions. Give your health care provider a list of all the medicines, herbs, non-prescription drugs, or dietary supplements you use. Also tell them if you smoke, drink alcohol, or use illegal drugs. Some items may interact with your medicine. What should I watch for while using this medicine? Your condition will be monitored carefully while you are receiving this medicine. You will need important blood work done while you are taking this medicine. This drug may make you feel generally unwell. This is not uncommon, as chemotherapy can affect healthy cells as well as cancer cells. Report any side effects. Continue your course of treatment even though you  feel ill unless your doctor tells you to stop. In some cases, you may be given additional medicines to  help with side effects. Follow all directions for their use. You may get drowsy or dizzy. Do not drive, use machinery, or do anything that needs mental alertness until you know how this medicine affects you. Do not stand or sit up quickly, especially if you are an older patient. This reduces the risk of dizzy or fainting spells. Call your doctor or health care professional for advice if you get a fever, chills or sore throat, or other symptoms of a cold or flu. Do not treat yourself. This drug decreases your body's ability to fight infections. Try to avoid being around people who are sick. This medicine may increase your risk to bruise or bleed. Call your doctor or health care professional if you notice any unusual bleeding. Be careful brushing and flossing your teeth or using a toothpick because you may get an infection or bleed more easily. If you have any dental work done, tell your dentist you are receiving this medicine. Avoid taking products that contain aspirin, acetaminophen, ibuprofen, naproxen, or ketoprofen unless instructed by your doctor. These medicines may hide a fever. Do not become pregnant while taking this medicine. Women should inform their doctor if they wish to become pregnant or think they might be pregnant. There is a potential for serious side effects to an unborn child. Talk to your health care professional or pharmacist for more information. Do not breast-feed an infant while taking this medicine. What side effects may I notice from receiving this medicine? Side effects that you should report to your doctor or health care professional as soon as possible: -allergic reactions like skin rash, itching or hives, swelling of the face, lips, or tongue -low blood counts - this medicine may decrease the number of white blood cells, red blood cells and platelets. You may be at increased risk for infections and bleeding. -signs of infection - fever or chills, cough, sore throat, pain or  difficulty passing urine -signs of decreased platelets or bleeding - bruising, pinpoint red spots on the skin, black, tarry stools, blood in the urine -signs of decreased red blood cells - unusually weak or tired, fainting spells, lightheadedness -breathing problems -chest pain -diarrhea -feeling faint or lightheaded, falls -flushing, runny nose, sweating during infusion -mouth sores or pain -pain, swelling, redness or irritation where injected -pain, swelling, warmth in the leg -pain, tingling, numbness in the hands or feet -problems with balance, talking, walking -stomach cramps, pain -trouble passing urine or change in the amount of urine -vomiting as to be unable to hold down drinks or food -yellowing of the eyes or skin Side effects that usually do not require medical attention (report to your doctor or health care professional if they continue or are bothersome): -constipation -hair loss -headache -loss of appetite -nausea, vomiting -stomach upset This list may not describe all possible side effects. Call your doctor for medical advice about side effects. You may report side effects to FDA at 1-800-FDA-1088. Where should I keep my medicine? This drug is given in a hospital or clinic and will not be stored at home. NOTE: This sheet is a summary. It may not cover all possible information. If you have questions about this medicine, talk to your doctor, pharmacist, or health care provider.  2015, Elsevier/[employer] Standard. (2013-05-16 16:29:32)  Oxaliplatin Injection What is this medicine? OXALIPLATIN (ox AL i PLA tin) is a chemotherapy drug. It targets  fast dividing cells, like cancer cells, and causes these cells to die. This medicine is used to treat cancers of the colon and rectum, and many other cancers. This medicine may be used for other purposes; ask your health care provider or pharmacist if you have questions. COMMON BRAND NAME(S): Eloxatin What should I tell my health  care provider before I take this medicine? They need to know if you have any of these conditions: -kidney disease -an unusual or allergic reaction to oxaliplatin, other chemotherapy, other medicines, foods, dyes, or preservatives -pregnant or trying to get pregnant -breast-feeding How should I use this medicine? This drug is given as an infusion into a vein. It is administered in a hospital or clinic by a specially trained health care professional. Talk to your pediatrician regarding the use of this medicine in children. Special care may be needed. Overdosage: If you think you have taken too much of this medicine contact a poison control center or emergency room at once. NOTE: This medicine is only for you. Do not share this medicine with others. What if I miss a dose? It is important not to miss a dose. Call your doctor or health care professional if you are unable to keep an appointment. What may interact with this medicine? -medicines to increase blood counts like filgrastim, pegfilgrastim, sargramostim -probenecid -some antibiotics like amikacin, gentamicin, neomycin, polymyxin B, streptomycin, tobramycin -zalcitabine Talk to your doctor or health care professional before taking any of these medicines: -acetaminophen -aspirin -ibuprofen -ketoprofen -naproxen This list may not describe all possible interactions. Give your health care provider a list of all the medicines, herbs, non-prescription drugs, or dietary supplements you use. Also tell them if you smoke, drink alcohol, or use illegal drugs. Some items may interact with your medicine. What should I watch for while using this medicine? Your condition will be monitored carefully while you are receiving this medicine. You will need important blood work done while you are taking this medicine. This medicine can make you more sensitive to cold. Do not drink cold drinks or use ice. Cover exposed skin before coming in contact with cold  temperatures or cold objects. When out in cold weather wear warm clothing and cover your mouth and nose to warm the air that goes into your lungs. Tell your doctor if you get sensitive to the cold. This drug may make you feel generally unwell. This is not uncommon, as chemotherapy can affect healthy cells as well as cancer cells. Report any side effects. Continue your course of treatment even though you feel ill unless your doctor tells you to stop. In some cases, you may be given additional medicines to help with side effects. Follow all directions for their use. Call your doctor or health care professional for advice if you get a fever, chills or sore throat, or other symptoms of a cold or flu. Do not treat yourself. This drug decreases your body's ability to fight infections. Try to avoid being around people who are sick. This medicine may increase your risk to bruise or bleed. Call your doctor or health care professional if you notice any unusual bleeding. Be careful brushing and flossing your teeth or using a toothpick because you may get an infection or bleed more easily. If you have any dental work done, tell your dentist you are receiving this medicine. Avoid taking products that contain aspirin, acetaminophen, ibuprofen, naproxen, or ketoprofen unless instructed by your doctor. These medicines may hide a fever. Do not become pregnant  while taking this medicine. Women should inform their doctor if they wish to become pregnant or think they might be pregnant. There is a potential for serious side effects to an unborn child. Talk to your health care professional or pharmacist for more information. Do not breast-feed an infant while taking this medicine. Call your doctor or health care professional if you get diarrhea. Do not treat yourself. What side effects may I notice from receiving this medicine? Side effects that you should report to your doctor or health care professional as soon as  possible: -allergic reactions like skin rash, itching or hives, swelling of the face, lips, or tongue -low blood counts - This drug may decrease the number of white blood cells, red blood cells and platelets. You may be at increased risk for infections and bleeding. -signs of infection - fever or chills, cough, sore throat, pain or difficulty passing urine -signs of decreased platelets or bleeding - bruising, pinpoint red spots on the skin, black, tarry stools, nosebleeds -signs of decreased red blood cells - unusually weak or tired, fainting spells, lightheadedness -breathing problems -chest pain, pressure -cough -diarrhea -jaw tightness -mouth sores -nausea and vomiting -pain, swelling, redness or irritation at the injection site -pain, tingling, numbness in the hands or feet -problems with balance, talking, walking -redness, blistering, peeling or loosening of the skin, including inside the mouth -trouble passing urine or change in the amount of urine Side effects that usually do not require medical attention (report to your doctor or health care professional if they continue or are bothersome): -changes in vision -constipation -hair loss -loss of appetite -metallic taste in the mouth or changes in taste -stomach pain This list may not describe all possible side effects. Call your doctor for medical advice about side effects. You may report side effects to FDA at 1-800-FDA-1088. Where should I keep my medicine? This drug is given in a hospital or clinic and will not be stored at home. NOTE: This sheet is a summary. It may not cover all possible information. If you have questions about this medicine, talk to your doctor, pharmacist, or health care provider.  2015, [employer]/Gold Standard. (2008-06-13 17:22:47)   Leucovorin injection What is this medicine? LEUCOVORIN (loo koe VOR in) is used to prevent or treat the harmful effects of some medicines. This medicine is used to treat  anemia caused by a low amount of folic acid in the body. It is also used with 5-fluorouracil (5-FU) to treat colon cancer. This medicine may be used for other purposes; ask your health care provider or pharmacist if you have questions. What should I tell my health care provider before I take this medicine? They need to know if you have any of these conditions: -anemia from low levels of vitamin B-12 in the blood -an unusual or allergic reaction to leucovorin, folic acid, other medicines, foods, dyes, or preservatives -pregnant or trying to get pregnant -breast-feeding How should I use this medicine? This medicine is for injection into a muscle or into a vein. It is given by a health care professional in a hospital or clinic setting. Talk to your pediatrician regarding the use of this medicine in children. Special care may be needed. Overdosage: If you think you have taken too much of this medicine contact a poison control center or emergency room at once. NOTE: This medicine is only for you. Do not share this medicine with others. What if I miss a dose? This does not apply. What may interact  with this medicine? -capecitabine -fluorouracil -phenobarbital -phenytoin -primidone -trimethoprim-sulfamethoxazole This list may not describe all possible interactions. Give your health care provider a list of all the medicines, herbs, non-prescription drugs, or dietary supplements you use. Also tell them if you smoke, drink alcohol, or use illegal drugs. Some items may interact with your medicine. What should I watch for while using this medicine? Your condition will be monitored carefully while you are receiving this medicine. This medicine may increase the side effects of 5-fluorouracil, 5-FU. Tell your doctor or health care professional if you have diarrhea or mouth sores that do not get better or that get worse. What side effects may I notice from receiving this medicine? Side effects that you  should report to your doctor or health care professional as soon as possible: -allergic reactions like skin rash, itching or hives, swelling of the face, lips, or tongue -breathing problems -fever, infection -mouth sores -unusual bleeding or bruising -unusually weak or tired Side effects that usually do not require medical attention (report to your doctor or health care professional if they continue or are bothersome): -constipation or diarrhea -loss of appetite -nausea, vomiting This list may not describe all possible side effects. Call your doctor for medical advice about side effects. You may report side effects to FDA at 1-800-FDA-1088. Where should I keep my medicine? This drug is given in a hospital or clinic and will not be stored at home. NOTE: This sheet is a summary. It may not cover all possible information. If you have questions about this medicine, talk to your doctor, pharmacist, or health care provider.  2015, [employer] Standard. (2008-05-23 16:50:29)  Fluorouracil, 5-FU injection What is this medicine? FLUOROURACIL, 5-FU (flure oh YOOR a sil) is a chemotherapy drug. It slows the growth of cancer cells. This medicine is used to treat many types of cancer like breast cancer, colon or rectal cancer, pancreatic cancer, and stomach cancer. This medicine may be used for other purposes; ask your health care provider or pharmacist if you have questions. COMMON BRAND NAME(S): Adrucil What should I tell my health care provider before I take this medicine? They need to know if you have any of these conditions: -blood disorders -dihydropyrimidine dehydrogenase (DPD) deficiency -infection (especially a virus infection such as chickenpox, cold sores, or herpes) -kidney disease -liver disease -malnourished, poor nutrition -recent or ongoing radiation therapy -an unusual or allergic reaction to fluorouracil, other chemotherapy, other medicines, foods, dyes, or  preservatives -pregnant or trying to get pregnant -breast-feeding How should I use this medicine? This drug is given as an infusion or injection into a vein. It is administered in a hospital or clinic by a specially trained health care professional. Talk to your pediatrician regarding the use of this medicine in children. Special care may be needed. Overdosage: If you think you have taken too much of this medicine contact a poison control center or emergency room at once. NOTE: This medicine is only for you. Do not share this medicine with others. What if I miss a dose? It is important not to miss your dose. Call your doctor or health care professional if you are unable to keep an appointment. What may interact with this medicine? -allopurinol -cimetidine -dapsone -digoxin -hydroxyurea -leucovorin -levamisole -medicines for seizures like ethotoin, fosphenytoin, phenytoin -medicines to increase blood counts like filgrastim, pegfilgrastim, sargramostim -medicines that treat or prevent blood clots like warfarin, enoxaparin, and dalteparin -methotrexate -metronidazole -pyrimethamine -some other chemotherapy drugs like busulfan, cisplatin, estramustine, vinblastine -trimethoprim -trimetrexate -vaccines  Talk to your doctor or health care professional before taking any of these medicines: -acetaminophen -aspirin -ibuprofen -ketoprofen -naproxen This list may not describe all possible interactions. Give your health care provider a list of all the medicines, herbs, non-prescription drugs, or dietary supplements you use. Also tell them if you smoke, drink alcohol, or use illegal drugs. Some items may interact with your medicine. What should I watch for while using this medicine? Visit your doctor for checks on your progress. This drug may make you feel generally unwell. This is not uncommon, as chemotherapy can affect healthy cells as well as cancer cells. Report any side effects. Continue  your course of treatment even though you feel ill unless your doctor tells you to stop. In some cases, you may be given additional medicines to help with side effects. Follow all directions for their use. Call your doctor or health care professional for advice if you get a fever, chills or sore throat, or other symptoms of a cold or flu. Do not treat yourself. This drug decreases your body's ability to fight infections. Try to avoid being around people who are sick. This medicine may increase your risk to bruise or bleed. Call your doctor or health care professional if you notice any unusual bleeding. Be careful brushing and flossing your teeth or using a toothpick because you may get an infection or bleed more easily. If you have any dental work done, tell your dentist you are receiving this medicine. Avoid taking products that contain aspirin, acetaminophen, ibuprofen, naproxen, or ketoprofen unless instructed by your doctor. These medicines may hide a fever. Do not become pregnant while taking this medicine. Women should inform their doctor if they wish to become pregnant or think they might be pregnant. There is a potential for serious side effects to an unborn child. Talk to your health care professional or pharmacist for more information. Do not breast-feed an infant while taking this medicine. Men should inform their doctor if they wish to father a child. This medicine may lower sperm counts. Do not treat diarrhea with over the counter products. Contact your doctor if you have diarrhea that lasts more than 2 days or if it is severe and watery. This medicine can make you more sensitive to the sun. Keep out of the sun. If you cannot avoid being in the sun, wear protective clothing and use sunscreen. Do not use sun lamps or tanning beds/booths. What side effects may I notice from receiving this medicine? Side effects that you should report to your doctor or health care professional as soon as  possible: -allergic reactions like skin rash, itching or hives, swelling of the face, lips, or tongue -low blood counts - this medicine may decrease the number of white blood cells, red blood cells and platelets. You may be at increased risk for infections and bleeding. -signs of infection - fever or chills, cough, sore throat, pain or difficulty passing urine -signs of decreased platelets or bleeding - bruising, pinpoint red spots on the skin, black, tarry stools, blood in the urine -signs of decreased red blood cells - unusually weak or tired, fainting spells, lightheadedness -breathing problems -changes in vision -chest pain -mouth sores -nausea and vomiting -pain, swelling, redness at site where injected -pain, tingling, numbness in the hands or feet -redness, swelling, or sores on hands or feet -stomach pain -unusual bleeding Side effects that usually do not require medical attention (report to your doctor or health care professional if they continue or are  bothersome): -changes in finger or toe nails -diarrhea -dry or itchy skin -hair loss -headache -loss of appetite -sensitivity of eyes to the light -stomach upset -unusually teary eyes This list may not describe all possible side effects. Call your doctor for medical advice about side effects. You may report side effects to FDA at 1-800-FDA-1088. Where should I keep my medicine? This drug is given in a hospital or clinic and will not be stored at home. NOTE: This sheet is a summary. It may not cover all possible information. If you have questions about this medicine, talk to your doctor, pharmacist, or health care provider.  2015, [employer]. (2008-03-22 13:53:16)

## 2015-03-23 ENCOUNTER — Ambulatory Visit: Payer: Medicare Other

## 2015-03-24 ENCOUNTER — Ambulatory Visit: Payer: Medicare Other

## 2015-03-24 ENCOUNTER — Ambulatory Visit (HOSPITAL_BASED_OUTPATIENT_CLINIC_OR_DEPARTMENT_OTHER): Payer: Medicare Other

## 2015-03-24 VITALS — BP 156/95 | HR 73 | Temp 98.3°F | Resp 15

## 2015-03-24 DIAGNOSIS — C250 Malignant neoplasm of head of pancreas: Secondary | ICD-10-CM

## 2015-03-24 DIAGNOSIS — C257 Malignant neoplasm of other parts of pancreas: Secondary | ICD-10-CM | POA: Diagnosis present

## 2015-03-24 MED ORDER — SODIUM CHLORIDE 0.9 % IJ SOLN
10.0000 mL | INTRAMUSCULAR | Status: DC | PRN
  Administered 2015-03-24: 10 mL
  Filled 2015-03-24: qty 10

## 2015-03-24 MED ORDER — HEPARIN SOD (PORK) LOCK FLUSH 100 UNIT/ML IV SOLN
500.0000 [IU] | Freq: Once | INTRAVENOUS | Status: AC | PRN
  Administered 2015-03-24: 500 [IU]
  Filled 2015-03-24: qty 5

## 2015-03-24 MED ORDER — PEGFILGRASTIM INJECTION 6 MG/0.6ML ~~LOC~~
6.0000 mg | PREFILLED_SYRINGE | Freq: Once | SUBCUTANEOUS | Status: AC
  Administered 2015-03-24: 6 mg via SUBCUTANEOUS

## 2015-03-24 NOTE — Progress Notes (Signed)
Charted on infusion appt

## 2015-03-24 NOTE — Progress Notes (Signed)
2 mL left to infuse - bolused before pump dc.

## 2015-03-24 NOTE — Patient Instructions (Signed)
Pegfilgrastim injection (Neulasta) What is this medicine? PEGFILGRASTIM (peg fil GRA stim) is a long-acting granulocyte colony-stimulating factor that stimulates the growth of neutrophils, a type of white blood cell important in the body's fight against infection. It is used to reduce the incidence of fever and infection in patients with certain types of cancer who are receiving chemotherapy that affects the bone marrow. This medicine may be used for other purposes; ask your health care provider or pharmacist if you have questions. COMMON BRAND NAME(S): Neulasta What should I tell my health care provider before I take this medicine? They need to know if you have any of these conditions: -latex allergy -ongoing radiation therapy -sickle cell disease -skin reactions to acrylic adhesives (On-Body Injector only) -an unusual or allergic reaction to pegfilgrastim, filgrastim, other medicines, foods, dyes, or preservatives -pregnant or trying to get pregnant -breast-feeding How should I use this medicine? This medicine is for injection under the skin. If you get this medicine at home, you will be taught how to prepare and give the pre-filled syringe or how to use the On-body Injector. Refer to the patient Instructions for Use for detailed instructions. Use exactly as directed. Take your medicine at regular intervals. Do not take your medicine more often than directed. It is important that you put your used needles and syringes in a special sharps container. Do not put them in a trash can. If you do not have a sharps container, call your pharmacist or healthcare provider to get one. Talk to your pediatrician regarding the use of this medicine in children. Special care may be needed. Overdosage: If you think you have taken too much of this medicine contact a poison control center or emergency room at once. NOTE: This medicine is only for you. Do not share this medicine with others. What if I miss a  dose? It is important not to miss your dose. Call your doctor or health care professional if you miss your dose. If you miss a dose due to an On-body Injector failure or leakage, a new dose should be administered as soon as possible using a single prefilled syringe for manual use. What may interact with this medicine? Interactions have not been studied. Give your health care provider a list of all the medicines, herbs, non-prescription drugs, or dietary supplements you use. Also tell them if you smoke, drink alcohol, or use illegal drugs. Some items may interact with your medicine. This list may not describe all possible interactions. Give your health care provider a list of all the medicines, herbs, non-prescription drugs, or dietary supplements you use. Also tell them if you smoke, drink alcohol, or use illegal drugs. Some items may interact with your medicine. What should I watch for while using this medicine? You may need blood work done while you are taking this medicine. If you are going to need a MRI, CT scan, or other procedure, tell your doctor that you are using this medicine (On-Body Injector only). What side effects may I notice from receiving this medicine? Side effects that you should report to your doctor or health care professional as soon as possible: -allergic reactions like skin rash, itching or hives, swelling of the face, lips, or tongue -dizziness -fever -pain, redness, or irritation at site where injected -pinpoint red spots on the skin -shortness of breath or breathing problems -stomach or side pain, or pain at the shoulder -swelling -tiredness -trouble passing urine Side effects that usually do not require medical attention (report to your   doctor or health care professional if they continue or are bothersome): -bone pain -muscle pain This list may not describe all possible side effects. Call your doctor for medical advice about side effects. You may report side  effects to FDA at 1-800-FDA-1088. Where should I keep my medicine? Keep out of the reach of children. Store pre-filled syringes in a refrigerator between 2 and 8 degrees C (36 and 46 degrees F). Do not freeze. Keep in carton to protect from light. Throw away this medicine if it is left out of the refrigerator for more than 48 hours. Throw away any unused medicine after the expiration date. NOTE: This sheet is a summary. It may not cover all possible information. If you have questions about this medicine, talk to your doctor, pharmacist, or health care provider.  2015, Elsevier/Gold Standard. (2014-02-16 16:14:05)  

## 2015-04-01 ENCOUNTER — Other Ambulatory Visit: Payer: Self-pay | Admitting: Oncology

## 2015-04-04 ENCOUNTER — Other Ambulatory Visit: Payer: Self-pay | Admitting: Nurse Practitioner

## 2015-04-04 ENCOUNTER — Telehealth: Payer: Self-pay | Admitting: Oncology

## 2015-04-04 ENCOUNTER — Ambulatory Visit (HOSPITAL_BASED_OUTPATIENT_CLINIC_OR_DEPARTMENT_OTHER): Payer: Medicare Other | Admitting: Oncology

## 2015-04-04 ENCOUNTER — Encounter: Payer: Self-pay | Admitting: Family Medicine

## 2015-04-04 ENCOUNTER — Ambulatory Visit: Payer: Medicare Other

## 2015-04-04 ENCOUNTER — Ambulatory Visit (HOSPITAL_BASED_OUTPATIENT_CLINIC_OR_DEPARTMENT_OTHER): Payer: Medicare Other

## 2015-04-04 ENCOUNTER — Other Ambulatory Visit (HOSPITAL_BASED_OUTPATIENT_CLINIC_OR_DEPARTMENT_OTHER): Payer: Medicare Other

## 2015-04-04 VITALS — BP 156/87 | HR 80 | Temp 98.3°F | Resp 16

## 2015-04-04 VITALS — BP 143/94 | HR 92 | Temp 98.2°F | Resp 18 | Ht 63.0 in | Wt 152.1 lb

## 2015-04-04 DIAGNOSIS — C250 Malignant neoplasm of head of pancreas: Secondary | ICD-10-CM | POA: Diagnosis present

## 2015-04-04 DIAGNOSIS — Z95828 Presence of other vascular implants and grafts: Secondary | ICD-10-CM

## 2015-04-04 DIAGNOSIS — I10 Essential (primary) hypertension: Secondary | ICD-10-CM

## 2015-04-04 DIAGNOSIS — J910 Malignant pleural effusion: Secondary | ICD-10-CM | POA: Diagnosis not present

## 2015-04-04 DIAGNOSIS — Z5111 Encounter for antineoplastic chemotherapy: Secondary | ICD-10-CM

## 2015-04-04 LAB — CBC WITH DIFFERENTIAL/PLATELET
BASO%: 0.5 % (ref 0.0–2.0)
BASOS ABS: 0.1 10*3/uL (ref 0.0–0.1)
EOS ABS: 0.2 10*3/uL (ref 0.0–0.5)
EOS%: 2.1 % (ref 0.0–7.0)
HCT: 31.6 % — ABNORMAL LOW (ref 34.8–46.6)
HGB: 10.5 g/dL — ABNORMAL LOW (ref 11.6–15.9)
LYMPH%: 4.8 % — ABNORMAL LOW (ref 14.0–49.7)
MCH: 29.4 pg (ref 25.1–34.0)
MCHC: 33.1 g/dL (ref 31.5–36.0)
MCV: 88.7 fL (ref 79.5–101.0)
MONO#: 1 10*3/uL — AB (ref 0.1–0.9)
MONO%: 8.6 % (ref 0.0–14.0)
NEUT%: 84 % — ABNORMAL HIGH (ref 38.4–76.8)
NEUTROS ABS: 9.8 10*3/uL — AB (ref 1.5–6.5)
Platelets: 168 10*3/uL (ref 145–400)
RBC: 3.56 10*6/uL — ABNORMAL LOW (ref 3.70–5.45)
RDW: 17.8 % — AB (ref 11.2–14.5)
WBC: 11.7 10*3/uL — AB (ref 3.9–10.3)
lymph#: 0.6 10*3/uL — ABNORMAL LOW (ref 0.9–3.3)

## 2015-04-04 LAB — COMPREHENSIVE METABOLIC PANEL (CC13)
ALBUMIN: 3 g/dL — AB (ref 3.5–5.0)
ALT: 48 U/L (ref 0–55)
AST: 60 U/L — AB (ref 5–34)
Alkaline Phosphatase: 259 U/L — ABNORMAL HIGH (ref 40–150)
Anion Gap: 10 mEq/L (ref 3–11)
BUN: 11.5 mg/dL (ref 7.0–26.0)
CO2: 26 meq/L (ref 22–29)
Calcium: 8.8 mg/dL (ref 8.4–10.4)
Chloride: 106 mEq/L (ref 98–109)
Creatinine: 0.8 mg/dL (ref 0.6–1.1)
EGFR: 77 mL/min/{1.73_m2} — AB (ref >90)
Glucose: 103 mg/dl (ref 70–140)
Potassium: 2.6 mEq/L — CL (ref 3.5–5.1)
SODIUM: 142 meq/L (ref 136–145)
Total Bilirubin: 0.42 mg/dL (ref 0.20–1.20)
Total Protein: 6.4 g/dL (ref 6.4–8.3)

## 2015-04-04 MED ORDER — POTASSIUM CHLORIDE CRYS ER 20 MEQ PO TBCR: 20.0000 meq | EXTENDED_RELEASE_TABLET | Freq: Two times a day (BID) | ORAL | Status: DC

## 2015-04-04 MED ORDER — METHYLPREDNISOLONE SODIUM SUCC 125 MG IJ SOLR
INTRAMUSCULAR | Status: AC
  Filled 2015-04-04: qty 2

## 2015-04-04 MED ORDER — SODIUM CHLORIDE 0.9 % IJ SOLN
10.0000 mL | INTRAMUSCULAR | Status: DC | PRN
  Administered 2015-04-04: 10 mL via INTRAVENOUS
  Filled 2015-04-04: qty 10

## 2015-04-04 MED ORDER — SODIUM CHLORIDE 0.9 % IV SOLN
2400.0000 mg/m2 | INTRAVENOUS | Status: DC
  Filled 2015-04-04: qty 86

## 2015-04-04 MED ORDER — DEXTROSE 5 % IV SOLN
180.0000 mg/m2 | Freq: Once | INTRAVENOUS | Status: DC
  Filled 2015-04-04: qty 16.2

## 2015-04-04 MED ORDER — METHYLPREDNISOLONE SODIUM SUCC 125 MG IJ SOLR
125.0000 mg | Freq: Once | INTRAMUSCULAR | Status: AC
  Administered 2015-04-04: 125 mg via INTRAVENOUS

## 2015-04-04 MED ORDER — HEPARIN SOD (PORK) LOCK FLUSH 100 UNIT/ML IV SOLN
500.0000 [IU] | Freq: Once | INTRAVENOUS | Status: AC
  Administered 2015-04-04: 500 [IU] via INTRAVENOUS
  Filled 2015-04-04: qty 5

## 2015-04-04 MED ORDER — ATROPINE SULFATE 1 MG/ML IJ SOLN: 0.5000 mg | Freq: Once | INTRAMUSCULAR | Status: AC | PRN

## 2015-04-04 MED ORDER — ATROPINE SULFATE 1 MG/ML IJ SOLN
INTRAMUSCULAR | Status: AC
  Filled 2015-04-04: qty 1

## 2015-04-04 MED ORDER — DEXTROSE 5 % IV SOLN
85.0000 mg/m2 | Freq: Once | INTRAVENOUS | Status: AC
  Administered 2015-04-04: 155 mg via INTRAVENOUS
  Filled 2015-04-04: qty 31

## 2015-04-04 MED ORDER — DIPHENHYDRAMINE HCL 50 MG/ML IJ SOLN
25.0000 mg | Freq: Once | INTRAMUSCULAR | Status: AC
  Administered 2015-04-04: 25 mg via INTRAVENOUS

## 2015-04-04 MED ORDER — FAMOTIDINE IN NACL 20-0.9 MG/50ML-% IV SOLN
INTRAVENOUS | Status: AC
  Filled 2015-04-04: qty 50

## 2015-04-04 MED ORDER — DIPHENHYDRAMINE HCL 50 MG/ML IJ SOLN
INTRAMUSCULAR | Status: AC
  Filled 2015-04-04: qty 1

## 2015-04-04 MED ORDER — DEXTROSE 5 % IV SOLN
Freq: Once | INTRAVENOUS | Status: AC
  Administered 2015-04-04: 13:00:00 via INTRAVENOUS

## 2015-04-04 MED ORDER — FAMOTIDINE IN NACL 20-0.9 MG/50ML-% IV SOLN
20.0000 mg | Freq: Two times a day (BID) | INTRAVENOUS | Status: DC
  Administered 2015-04-04: 20 mg via INTRAVENOUS

## 2015-04-04 MED ORDER — SODIUM CHLORIDE 0.9 % IV SOLN
Freq: Once | INTRAVENOUS | Status: AC
  Administered 2015-04-04: 13:00:00 via INTRAVENOUS
  Filled 2015-04-04: qty 8

## 2015-04-04 MED ORDER — LEUCOVORIN CALCIUM INJECTION 350 MG
400.0000 mg/m2 | Freq: Once | INTRAVENOUS | Status: DC
  Filled 2015-04-04: qty 36

## 2015-04-04 NOTE — Progress Notes (Signed)
CMET reviewed by Dr. Benay Spice: potassium 2.6. Pt to increase KCL to twice daily.

## 2015-04-04 NOTE — Progress Notes (Signed)
1710 at completion of oxaliplatin, patient c/o blurred vision. Denies chest pain, SOB or difficulty swallowing. Vss. Cindy bacon np notified.

## 2015-04-04 NOTE — Progress Notes (Signed)
[place] OFFICE PROGRESS NOTE   Diagnosis: Pancreas cancer  INTERVAL HISTORY:   Sonya Larson returns as scheduled. She completed cycle 1 FOLFIRINOX 03/21/2015. At the end of the oxaliplatin infusion she developed tongue numbness and dysarthria. No shortness of breath or difficulty swallowing. No swelling. She was treated with a hypersensitivity protocol. The symptoms did not resolve over the next hour and she was transferred to the emergency room. She reports the symptoms spontaneously resolved over several hours.  She had nausea several days after chemotherapy without emesis. She reports 2 days of diarrhea. She had emesis after brushing her teeth last week. She complains of malaise. No dyspnea.  Objective:  Vital signs in last 24 hours:  Blood pressure 143/94, pulse 92, temperature 98.2 F (36.8 C), temperature source Oral, resp. rate 18, height 5\' 3"  (1.6 m), weight 152 lb 1.6 oz (68.992 kg), last menstrual period 10/31/2005, SpO2 99 %.    HEENT: No thrush or ulcers Resp: Decreased breath sounds at the left lower chest, no respiratory distress Cardio: Regular rate and rhythm GI: No hepatomegaly, no mass Vascular: No leg edema Neuro: Alert and oriented, the speech is fluent  Skin: Palms without erythema   Portacath/PICC-without erythema  Lab Results:  Lab Results  Component Value Date   WBC 11.7* 04/04/2015   HGB 10.5* 04/04/2015   HCT 31.6* 04/04/2015   MCV 88.7 04/04/2015   PLT 168 04/04/2015   NEUTROABS 9.8* 04/04/2015    Lab Results  Component Value Date   NA 138 03/21/2015    No results found for: CEA  Imaging:  No results found.  Medications: I have reviewed the patient's current medications.  Assessment/Plan: 1. Adenocarcinoma the pancreas, uncinate mass, EUS April 2015 revealed a uT3,uN1 lesion with an FNA biopsy confirming adenocarcinoma  MRI abdomen 07/04/2014 confirmed a pancreas uncinate 8 mass, 11 mm portacaval lymph node,  no vascular involvement, and no evidence of distant metastatic disease   PET scan 07/20/2012 with a hypermetabolic uncinate process mass and hypermetabolic periportal lymph node   Markedly elevated CA 19-9   Negative diagnostic laparoscopy 07/24/2014   Initiation of Xeloda/radiation 08/10/2014.completed 09/21/2014.  CT 10/03/2014 confirmed a decrease in the pancreas mass, stable portacaval lymph node, and apparent involvement of the superior mesenteric artery  Pancreaticoduodenectomy on 11/02/2014 confirmed a poorly differentiated adenocarcinoma, ypT2,ypN0  Initiation of adjuvant gemcitabine 12/27/2014  Rising CA 19-07 February 2015  Left thoracentesis 03/08/2015 confirmed metastatic adenocarcinoma  CTs 03/16/2015 with a left pleural effusion and new right lung nodule  Cycle 1 FOLFIRINOX 03/21/2015 2. Hypertension 3. Hyperlipidemia  4. Vaginal spotting summer 2015-evaluated by gynecology  5. Zoster rash at June 2015  6. Asthma  7. Family history of pancreas cancer 8. Nausea following the pancreaticoduodenectomy procedure. Improved. 9. Anemia. 10. Admission with a fever 02/09/2015-no source for infection identified, treated for presumptive pneumonia 11. Left pleural effusion. Slightly larger on follow-up chest x-ray 03/06/2015. Status post a diagnostic/therapeutic thoracentesis 03/08/2015 cytology confirmed metastatic adenocarcinoma 12. Dysarthria/tongue numbness at the completion of oxaliplatin with cycle 1 FOLFIRINOX 03/21/2015-spontaneously resolved over hours   Disposition:  Sonya Larson appears stable. The plan is to proceed with cycle 2 FOLFIRINOX today. The tongue numbness and dysarthria she experienced with cycle 1 FOLFIRINOX was likely an acute neurotoxicity related to oxaliplatin. She understands this could occur with cycle 2. We will dilute the oxaliplatin and infuse at a slower rate with this cycle.  She will return for an office visit and cycle 3 FOLFIRINOX in  2 weeks. She will be scheduled for a chest x-ray and CA 19-9 level in 2 weeks.  Sonya Coder, MD  04/04/2015  12:03 PM

## 2015-04-04 NOTE — Telephone Encounter (Signed)
Labs/ov/pump/inj added per 05/04 POF, sent msg to add Folforinox, pt will be given a schedule in chemo room.Marland Kitchen KJ

## 2015-04-04 NOTE — Progress Notes (Signed)
Addendum:  Patient experienced some mouth numbness/tingling; and some mild difficulty forming words after completing her chemotherapy last week.  Patient was given hypersensitivity medications per protocol; and symptoms did partially resolve while at the cancer center.  Due to the late hour of the day-patient was transferred to the emergency department for further observation and management of her symptoms.  Per emergency department notes-patient was monitored closely while in the emergency department and all symptoms did completely resolve.  Patient was allowed to be discharged home at that time.  Patient completed the oxaliplatin portion of her chemotherapy today; and is now complaining of some intermittent double vision.  She denies any tingling/numbness to her mouth or tongue at this time; but patient is noted to be concentrating to pronounce her words correctly.  Patient denies any other new symptoms whatsoever.  Patient with no obvious weakness on exam.  Vital signs remained stable.  Since it is late afternoon and patient has begun with some of the same hypersensitivity reaction symptoms began-decision was made to give patient Benadryl/Pepcid/Solu-Medrol and hold the rest of the patient's chemotherapy and pump start until first thing in the morning.  Patient will be monitored for approximately 30 minutes following administration of her hypersensitivity medications here at the cancer center prior to letting her be discharged home with her family.  She has been advised to go directly to the emergency department for any worsening hypersensitivity reaction symptoms whatsoever.

## 2015-04-04 NOTE — Patient Instructions (Signed)

## 2015-04-04 NOTE — Patient Instructions (Signed)
[facility] Discharge Instructions for Patients Receiving Chemotherapy  Today you received the following chemotherapy agents FOLFIRINOX.  To help prevent nausea and vomiting after your treatment, we encourage you to take your nausea medication as directed.    If you develop nausea and vomiting that is not controlled by your nausea medication, call the clinic.   BELOW ARE SYMPTOMS THAT SHOULD BE REPORTED IMMEDIATELY:  *FEVER GREATER THAN 100.5 F  *CHILLS WITH OR WITHOUT FEVER  NAUSEA AND VOMITING THAT IS NOT CONTROLLED WITH YOUR NAUSEA MEDICATION  *UNUSUAL SHORTNESS OF BREATH  *UNUSUAL BRUISING OR BLEEDING  TENDERNESS IN MOUTH AND THROAT WITH OR WITHOUT PRESENCE OF ULCERS  *URINARY PROBLEMS  *BOWEL PROBLEMS  UNUSUAL RASH Items with * indicate a potential emergency and should be followed up as soon as possible.  Feel free to call the clinic you have any questions or concerns. The clinic phone number is [phone].  Please show the [place] at check-in to the Emergency Department and triage nurse.

## 2015-04-05 ENCOUNTER — Ambulatory Visit (HOSPITAL_BASED_OUTPATIENT_CLINIC_OR_DEPARTMENT_OTHER): Payer: Medicare Other

## 2015-04-05 ENCOUNTER — Other Ambulatory Visit: Payer: Self-pay | Admitting: Oncology

## 2015-04-05 VITALS — BP 134/88 | HR 82 | Temp 97.9°F | Resp 18

## 2015-04-05 DIAGNOSIS — Z5111 Encounter for antineoplastic chemotherapy: Secondary | ICD-10-CM | POA: Diagnosis present

## 2015-04-05 DIAGNOSIS — C257 Malignant neoplasm of other parts of pancreas: Secondary | ICD-10-CM

## 2015-04-05 DIAGNOSIS — C250 Malignant neoplasm of head of pancreas: Secondary | ICD-10-CM

## 2015-04-05 MED ORDER — SODIUM CHLORIDE 0.9 % IV SOLN
Freq: Once | INTRAVENOUS | Status: AC
  Administered 2015-04-05: 10:00:00 via INTRAVENOUS
  Filled 2015-04-05: qty 4

## 2015-04-05 MED ORDER — DEXTROSE 5 % IV SOLN
180.0000 mg/m2 | Freq: Once | INTRAVENOUS | Status: AC
  Administered 2015-04-05: 324 mg via INTRAVENOUS
  Filled 2015-04-05: qty 16.2

## 2015-04-05 MED ORDER — SODIUM CHLORIDE 0.9 % IV SOLN
INTRAVENOUS | Status: DC
  Administered 2015-04-05: 10:00:00 via INTRAVENOUS

## 2015-04-05 MED ORDER — SODIUM CHLORIDE 0.9 % IV SOLN
2400.0000 mg/m2 | INTRAVENOUS | Status: DC
  Administered 2015-04-05: 4300 mg via INTRAVENOUS
  Filled 2015-04-05: qty 86

## 2015-04-05 MED ORDER — ATROPINE SULFATE 1 MG/ML IJ SOLN
0.5000 mg | Freq: Once | INTRAMUSCULAR | Status: AC | PRN
  Administered 2015-04-05: 0.5 mg via INTRAVENOUS

## 2015-04-05 MED ORDER — LEUCOVORIN CALCIUM INJECTION 350 MG
400.0000 mg/m2 | Freq: Once | INTRAVENOUS | Status: AC
  Administered 2015-04-05: 720 mg via INTRAVENOUS
  Filled 2015-04-05: qty 36

## 2015-04-05 NOTE — Progress Notes (Signed)
Patient returns to receive cpt-11 and leucovorin and 5-fu pump. C/o nausea, numbness left hand and right foot, vision much better today. Note to dr sherrill's desk.

## 2015-04-05 NOTE — Patient Instructions (Signed)
Irinotecan injection What is this medicine? IRINOTECAN (ir in oh TEE kan ) is a chemotherapy drug. It is used to treat colon and rectal cancer. This medicine may be used for other purposes; ask your health care provider or pharmacist if you have questions. COMMON BRAND NAME(S): Camptosar What should I tell my health care provider before I take this medicine? They need to know if you have any of these conditions: -blood disorders -dehydration -diarrhea -infection (especially a virus infection such as chickenpox, cold sores, or herpes) -liver disease -low blood counts, like low white cell, platelet, or red cell counts -recent or ongoing radiation therapy -an unusual or allergic reaction to irinotecan, sorbitol, other chemotherapy, other medicines, foods, dyes, or preservatives -pregnant or trying to get pregnant -breast-feeding How should I use this medicine? This drug is given as an infusion into a vein. It is administered in a hospital or clinic by a specially trained health care professional. Talk to your pediatrician regarding the use of this medicine in children. Special care may be needed. Overdosage: If you think you have taken too much of this medicine contact a poison control center or emergency room at once. NOTE: This medicine is only for you. Do not share this medicine with others. What if I miss a dose? It is important not to miss your dose. Call your doctor or health care professional if you are unable to keep an appointment. What may interact with this medicine? Do not take this medicine with any of the following medications: -atazanavir -certain medicines for fungal infections like itraconazole and ketoconazole -St. John's Wort This medicine may also interact with the following medications: -dexamethasone -diuretics -laxatives -medicines for seizures like carbamazepine, mephobarbital, phenobarbital, phenytoin, primidone -medicines to increase blood counts like  filgrastim, pegfilgrastim, sargramostim -prochlorperazine -vaccines This list may not describe all possible interactions. Give your health care provider a list of all the medicines, herbs, non-prescription drugs, or dietary supplements you use. Also tell them if you smoke, drink alcohol, or use illegal drugs. Some items may interact with your medicine. What should I watch for while using this medicine? Your condition will be monitored carefully while you are receiving this medicine. You will need important blood work done while you are taking this medicine. This drug may make you feel generally unwell. This is not uncommon, as chemotherapy can affect healthy cells as well as cancer cells. Report any side effects. Continue your course of treatment even though you feel ill unless your doctor tells you to stop. In some cases, you may be given additional medicines to help with side effects. Follow all directions for their use. You may get drowsy or dizzy. Do not drive, use machinery, or do anything that needs mental alertness until you know how this medicine affects you. Do not stand or sit up quickly, especially if you are an older patient. This reduces the risk of dizzy or fainting spells. Call your doctor or health care professional for advice if you get a fever, chills or sore throat, or other symptoms of a cold or flu. Do not treat yourself. This drug decreases your body's ability to fight infections. Try to avoid being around people who are sick. This medicine may increase your risk to bruise or bleed. Call your doctor or health care professional if you notice any unusual bleeding. Be careful brushing and flossing your teeth or using a toothpick because you may get an infection or bleed more easily. If you have any dental work done, tell  your dentist you are receiving this medicine. Avoid taking products that contain aspirin, acetaminophen, ibuprofen, naproxen, or ketoprofen unless instructed by your  doctor. These medicines may hide a fever. Do not become pregnant while taking this medicine. Women should inform their doctor if they wish to become pregnant or think they might be pregnant. There is a potential for serious side effects to an unborn child. Talk to your health care professional or pharmacist for more information. Do not breast-feed an infant while taking this medicine. What side effects may I notice from receiving this medicine? Side effects that you should report to your doctor or health care professional as soon as possible: -allergic reactions like skin rash, itching or hives, swelling of the face, lips, or tongue -low blood counts - this medicine may decrease the number of white blood cells, red blood cells and platelets. You may be at increased risk for infections and bleeding. -signs of infection - fever or chills, cough, sore throat, pain or difficulty passing urine -signs of decreased platelets or bleeding - bruising, pinpoint red spots on the skin, black, tarry stools, blood in the urine -signs of decreased red blood cells - unusually weak or tired, fainting spells, lightheadedness -breathing problems -chest pain -diarrhea -feeling faint or lightheaded, falls -flushing, runny nose, sweating during infusion -mouth sores or pain -pain, swelling, redness or irritation where injected -pain, swelling, warmth in the leg -pain, tingling, numbness in the hands or feet -problems with balance, talking, walking -stomach cramps, pain -trouble passing urine or change in the amount of urine -vomiting as to be unable to hold down drinks or food -yellowing of the eyes or skin Side effects that usually do not require medical attention (report to your doctor or health care professional if they continue or are bothersome): -constipation -hair loss -headache -loss of appetite -nausea, vomiting -stomach upset This list may not describe all possible side effects. Call your doctor for  medical advice about side effects. You may report side effects to FDA at 1-800-FDA-1088. Where should I keep my medicine? This drug is given in a hospital or clinic and will not be stored at home. NOTE: This sheet is a summary. It may not cover all possible information. If you have questions about this medicine, talk to your doctor, pharmacist, or health care provider.  2015, Elsevier/Gold Standard. (2013-05-16 16:29:32) Leucovorin injection What is this medicine? LEUCOVORIN (loo koe VOR in) is used to prevent or treat the harmful effects of some medicines. This medicine is used to treat anemia caused by a low amount of folic acid in the body. It is also used with 5-fluorouracil (5-FU) to treat colon cancer. This medicine may be used for other purposes; ask your health care provider or pharmacist if you have questions. What should I tell my health care provider before I take this medicine? They need to know if you have any of these conditions: -anemia from low levels of vitamin B-12 in the blood -an unusual or allergic reaction to leucovorin, folic acid, other medicines, foods, dyes, or preservatives -pregnant or trying to get pregnant -breast-feeding How should I use this medicine? This medicine is for injection into a muscle or into a vein. It is given by a health care professional in a hospital or clinic setting. Talk to your pediatrician regarding the use of this medicine in children. Special care may be needed. Overdosage: If you think you have taken too much of this medicine contact a poison control center or emergency room at  once. NOTE: This medicine is only for you. Do not share this medicine with others. What if I miss a dose? This does not apply. What may interact with this medicine? -capecitabine -fluorouracil -phenobarbital -phenytoin -primidone -trimethoprim-sulfamethoxazole This list may not describe all possible interactions. Give your health care provider a list of all  the medicines, herbs, non-prescription drugs, or dietary supplements you use. Also tell them if you smoke, drink alcohol, or use illegal drugs. Some items may interact with your medicine. What should I watch for while using this medicine? Your condition will be monitored carefully while you are receiving this medicine. This medicine may increase the side effects of 5-fluorouracil, 5-FU. Tell your doctor or health care professional if you have diarrhea or mouth sores that do not get better or that get worse. What side effects may I notice from receiving this medicine? Side effects that you should report to your doctor or health care professional as soon as possible: -allergic reactions like skin rash, itching or hives, swelling of the face, lips, or tongue -breathing problems -fever, infection -mouth sores -unusual bleeding or bruising -unusually weak or tired Side effects that usually do not require medical attention (report to your doctor or health care professional if they continue or are bothersome): -constipation or diarrhea -loss of appetite -nausea, vomiting This list may not describe all possible side effects. Call your doctor for medical advice about side effects. You may report side effects to FDA at 1-800-FDA-1088. Where should I keep my medicine? This drug is given in a hospital or clinic and will not be stored at home. NOTE: This sheet is a summary. It may not cover all possible information. If you have questions about this medicine, talk to your doctor, pharmacist, or health care provider.  2015, Elsevier/Gold Standard. (2008-05-23 16:50:29)

## 2015-04-05 NOTE — Progress Notes (Signed)
Received FMLA paperwork from pt's daughter. Forms taken to managed care dept.

## 2015-04-06 ENCOUNTER — Encounter: Payer: Self-pay | Admitting: Oncology

## 2015-04-06 ENCOUNTER — Ambulatory Visit: Payer: Medicare Other

## 2015-04-06 ENCOUNTER — Other Ambulatory Visit: Payer: Self-pay | Admitting: Medical Oncology

## 2015-04-06 NOTE — Progress Notes (Signed)
I placed fmla forms on the desk of nurse for dr. Benay Spice.

## 2015-04-07 ENCOUNTER — Ambulatory Visit (HOSPITAL_BASED_OUTPATIENT_CLINIC_OR_DEPARTMENT_OTHER): Payer: Medicare Other

## 2015-04-07 VITALS — BP 126/87 | HR 82 | Temp 97.8°F | Resp 18

## 2015-04-07 DIAGNOSIS — Z5189 Encounter for other specified aftercare: Secondary | ICD-10-CM

## 2015-04-07 DIAGNOSIS — C257 Malignant neoplasm of other parts of pancreas: Secondary | ICD-10-CM | POA: Diagnosis present

## 2015-04-07 MED ORDER — SODIUM CHLORIDE 0.9 % IJ SOLN
10.0000 mL | INTRAMUSCULAR | Status: DC | PRN
  Administered 2015-04-07: 10 mL
  Filled 2015-04-07: qty 10

## 2015-04-07 MED ORDER — HEPARIN SOD (PORK) LOCK FLUSH 100 UNIT/ML IV SOLN
500.0000 [IU] | Freq: Once | INTRAVENOUS | Status: AC | PRN
  Administered 2015-04-07: 500 [IU]
  Filled 2015-04-07: qty 5

## 2015-04-07 MED ORDER — PEGFILGRASTIM INJECTION 6 MG/0.6ML ~~LOC~~
6.0000 mg | PREFILLED_SYRINGE | Freq: Once | SUBCUTANEOUS | Status: AC
  Administered 2015-04-07: 6 mg via SUBCUTANEOUS

## 2015-04-09 ENCOUNTER — Encounter: Payer: Self-pay | Admitting: Oncology

## 2015-04-09 NOTE — Progress Notes (Signed)
Faxed prior auth for promethazine

## 2015-04-09 NOTE — Progress Notes (Signed)
I emailed questions to dr. Benay Spice. I am trying to get prior auth for promathazine.

## 2015-04-09 NOTE — Progress Notes (Signed)
I faxed fmla forms for [place] [facility] [employer] to [phone]

## 2015-04-10 ENCOUNTER — Encounter: Payer: Self-pay | Admitting: Oncology

## 2015-04-10 NOTE — Progress Notes (Signed)
Per [employer].. Promethazine hcl tablet is approved 01/09/15-04/08/16. I will send to medical records.

## 2015-04-15 ENCOUNTER — Other Ambulatory Visit: Payer: Self-pay | Admitting: Oncology

## 2015-04-18 ENCOUNTER — Ambulatory Visit (HOSPITAL_BASED_OUTPATIENT_CLINIC_OR_DEPARTMENT_OTHER): Payer: Medicare Other

## 2015-04-18 ENCOUNTER — Ambulatory Visit (HOSPITAL_COMMUNITY)
Admission: RE | Admit: 2015-04-18 | Discharge: 2015-04-18 | Disposition: A | Discharge status: Home / Self Care | Payer: Medicare Other | Source: Ambulatory Visit | Attending: Oncology | Admitting: Oncology

## 2015-04-18 ENCOUNTER — Ambulatory Visit: Payer: Medicare Other

## 2015-04-18 ENCOUNTER — Ambulatory Visit (HOSPITAL_BASED_OUTPATIENT_CLINIC_OR_DEPARTMENT_OTHER): Payer: Medicare Other | Admitting: Oncology

## 2015-04-18 ENCOUNTER — Telehealth: Payer: Self-pay | Admitting: Oncology

## 2015-04-18 ENCOUNTER — Other Ambulatory Visit (HOSPITAL_BASED_OUTPATIENT_CLINIC_OR_DEPARTMENT_OTHER): Payer: Medicare Other

## 2015-04-18 VITALS — BP 168/92 | HR 79

## 2015-04-18 VITALS — BP 151/93 | HR 92 | Temp 97.9°F | Resp 18 | Ht 63.0 in | Wt 149.6 lb

## 2015-04-18 DIAGNOSIS — J910 Malignant pleural effusion: Secondary | ICD-10-CM

## 2015-04-18 DIAGNOSIS — Z5111 Encounter for antineoplastic chemotherapy: Secondary | ICD-10-CM

## 2015-04-18 DIAGNOSIS — C250 Malignant neoplasm of head of pancreas: Secondary | ICD-10-CM

## 2015-04-18 DIAGNOSIS — D649 Anemia, unspecified: Secondary | ICD-10-CM

## 2015-04-18 DIAGNOSIS — J90 Pleural effusion, not elsewhere classified: Secondary | ICD-10-CM | POA: Diagnosis not present

## 2015-04-18 DIAGNOSIS — C257 Malignant neoplasm of other parts of pancreas: Secondary | ICD-10-CM

## 2015-04-18 DIAGNOSIS — I10 Essential (primary) hypertension: Secondary | ICD-10-CM

## 2015-04-18 DIAGNOSIS — Z95828 Presence of other vascular implants and grafts: Secondary | ICD-10-CM

## 2015-04-18 LAB — COMPREHENSIVE METABOLIC PANEL (CC13)
ALT: 50 U/L (ref 0–55)
ANION GAP: 11 meq/L (ref 3–11)
AST: 66 U/L — AB (ref 5–34)
Albumin: 2.6 g/dL — ABNORMAL LOW (ref 3.5–5.0)
Alkaline Phosphatase: 254 U/L — ABNORMAL HIGH (ref 40–150)
BILIRUBIN TOTAL: 0.39 mg/dL (ref 0.20–1.20)
BUN: 6.8 mg/dL — ABNORMAL LOW (ref 7.0–26.0)
CO2: 22 meq/L (ref 22–29)
CREATININE: 0.7 mg/dL (ref 0.6–1.1)
Calcium: 8.6 mg/dL (ref 8.4–10.4)
Chloride: 110 mEq/L — ABNORMAL HIGH (ref 98–109)
EGFR: >90 mL/min/{1.73_m2} (ref >90)
Glucose: 123 mg/dl (ref 70–140)
Potassium: 3 mEq/L — CL (ref 3.5–5.1)
SODIUM: 143 meq/L (ref 136–145)
Total Protein: 5.9 g/dL — ABNORMAL LOW (ref 6.4–8.3)

## 2015-04-18 LAB — CBC WITH DIFFERENTIAL/PLATELET
BASO%: 0.1 % (ref 0.0–2.0)
Basophils Absolute: 0 10*3/uL (ref 0.0–0.1)
EOS%: 0.5 % (ref 0.0–7.0)
Eosinophils Absolute: 0 10*3/uL (ref 0.0–0.5)
HEMATOCRIT: 27.3 % — AB (ref 34.8–46.6)
HGB: 8.8 g/dL — ABNORMAL LOW (ref 11.6–15.9)
LYMPH#: 0.4 10*3/uL — AB (ref 0.9–3.3)
LYMPH%: 5.3 % — ABNORMAL LOW (ref 14.0–49.7)
MCH: 29.5 pg (ref 25.1–34.0)
MCHC: 32.2 g/dL (ref 31.5–36.0)
MCV: 91.6 fL (ref 79.5–101.0)
MONO#: 0.9 10*3/uL (ref 0.1–0.9)
MONO%: 11.5 % (ref 0.0–14.0)
NEUT#: 6.6 10*3/uL — ABNORMAL HIGH (ref 1.5–6.5)
NEUT%: 82.6 % — ABNORMAL HIGH (ref 38.4–76.8)
Platelets: 107 10*3/uL — ABNORMAL LOW (ref 145–400)
RBC: 2.98 10*6/uL — ABNORMAL LOW (ref 3.70–5.45)
RDW: 17.6 % — ABNORMAL HIGH (ref 11.2–14.5)
WBC: 8 10*3/uL (ref 3.9–10.3)

## 2015-04-18 LAB — CANCER ANTIGEN 19-9: CA 19-9: 2338.4 U/mL — ABNORMAL HIGH (ref <35.0)

## 2015-04-18 MED ORDER — ATROPINE SULFATE 1 MG/ML IJ SOLN
0.5000 mg | Freq: Once | INTRAMUSCULAR | Status: AC | PRN
  Administered 2015-04-18: 0.5 mg via INTRAVENOUS

## 2015-04-18 MED ORDER — LEUCOVORIN CALCIUM INJECTION 350 MG
400.0000 mg/m2 | Freq: Once | INTRAVENOUS | Status: AC
  Administered 2015-04-18: 720 mg via INTRAVENOUS
  Filled 2015-04-18: qty 36

## 2015-04-18 MED ORDER — OXALIPLATIN CHEMO INJECTION 100 MG/20ML
85.0000 mg/m2 | Freq: Once | INTRAVENOUS | Status: AC
  Administered 2015-04-18: 155 mg via INTRAVENOUS
  Filled 2015-04-18: qty 31

## 2015-04-18 MED ORDER — DIPHENHYDRAMINE HCL 50 MG/ML IJ SOLN
25.0000 mg | Freq: Once | INTRAMUSCULAR | Status: AC
  Administered 2015-04-18: 25 mg via INTRAVENOUS

## 2015-04-18 MED ORDER — HEPARIN SOD (PORK) LOCK FLUSH 100 UNIT/ML IV SOLN
500.0000 [IU] | Freq: Once | INTRAVENOUS | Status: DC | PRN
  Filled 2015-04-18: qty 5

## 2015-04-18 MED ORDER — SODIUM CHLORIDE 0.9 % IJ SOLN
10.0000 mL | INTRAMUSCULAR | Status: DC | PRN
  Administered 2015-04-18: 10 mL via INTRAVENOUS
  Filled 2015-04-18: qty 10

## 2015-04-18 MED ORDER — DEXTROSE 5 % IV SOLN
Freq: Once | INTRAVENOUS | Status: AC
  Administered 2015-04-18: 10:00:00 via INTRAVENOUS

## 2015-04-18 MED ORDER — ATROPINE SULFATE 1 MG/ML IJ SOLN
INTRAMUSCULAR | Status: AC
  Filled 2015-04-18: qty 1

## 2015-04-18 MED ORDER — POTASSIUM CHLORIDE CRYS ER 20 MEQ PO TBCR
20.0000 meq | EXTENDED_RELEASE_TABLET | Freq: Once | ORAL | Status: AC
  Administered 2015-04-18: 20 meq via ORAL
  Filled 2015-04-18: qty 1

## 2015-04-18 MED ORDER — FLUOROURACIL CHEMO INJECTION 5 GM/100ML
2400.0000 mg/m2 | INTRAVENOUS | Status: DC
  Administered 2015-04-18: 4300 mg via INTRAVENOUS
  Filled 2015-04-18: qty 86

## 2015-04-18 MED ORDER — METHYLPREDNISOLONE SODIUM SUCC 125 MG IJ SOLR
125.0000 mg | Freq: Once | INTRAMUSCULAR | Status: AC
  Administered 2015-04-18: 125 mg via INTRAVENOUS

## 2015-04-18 MED ORDER — SODIUM CHLORIDE 0.9 % IV SOLN
Freq: Once | INTRAVENOUS | Status: AC
  Administered 2015-04-18: 10:00:00 via INTRAVENOUS
  Filled 2015-04-18: qty 8

## 2015-04-18 MED ORDER — DEXTROSE 5 % IV SOLN
180.0000 mg/m2 | Freq: Once | INTRAVENOUS | Status: AC
  Administered 2015-04-18: 324 mg via INTRAVENOUS
  Filled 2015-04-18: qty 16.2

## 2015-04-18 MED ORDER — SODIUM CHLORIDE 0.9 % IJ SOLN
10.0000 mL | INTRAMUSCULAR | Status: DC | PRN
  Filled 2015-04-18: qty 10

## 2015-04-18 MED ORDER — FAMOTIDINE IN NACL 20-0.9 MG/50ML-% IV SOLN
20.0000 mg | Freq: Once | INTRAVENOUS | Status: AC
  Administered 2015-04-18: 20 mg via INTRAVENOUS

## 2015-04-18 NOTE — Telephone Encounter (Signed)
Gave adn printed appt sched and avs fo rpt for May and June °

## 2015-04-18 NOTE — Progress Notes (Signed)
[facility] OFFICE PROGRESS NOTE   Diagnosis: Pancreas cancer  INTERVAL HISTORY:   Sonya Larson returns as scheduled. She completed another cycle of FOLFIRINOX 04/04/2015. She developed mild diplopia and mild dysarthria at the completion of the oxaliplatin infusion. The symptoms resolved over approximately 2 hours. No symptoms of an allergic reaction. She reports 3-4 days of diarrhea last week. She continues to have cold sensitivity. No peripheral numbness or tingling. Her appetite variable. No dyspnea.  Objective:  Vital signs in last 24 hours:  Blood pressure 151/93, pulse 92, temperature 97.9 F (36.6 C), temperature source Oral, resp. rate 18, height 5\' 3"  (1.6 m), weight 149 lb 9.6 oz (67.858 kg), last menstrual period 10/31/2005, SpO2 100 %.    HEENT: No thrush or ulcers Resp: Lungs clear bilaterally, no respiratory distress Cardio: Regular rate and rhythm GI: No hepatomegaly, no mass, nontender Vascular: No leg edema  Portacath/PICC-without erythema  Lab Results:  Lab Results  Component Value Date   WBC 8.0 04/18/2015   HGB 8.8* 04/18/2015   HCT 27.3* 04/18/2015   MCV 91.6 04/18/2015   PLT 107* 04/18/2015   NEUTROABS 6.6* 04/18/2015   potassium 3.0, creatinine 0.7    Imaging:  Dg Chest 2 View  04/18/2015   CLINICAL DATA:  Adenocarcinoma of head of pancreas. Follow-up left pleural effusion.  EXAM: CHEST  2 VIEW  COMPARISON:  Chest CT 03/15/2015  FINDINGS: Left subclavian Port-A-Cath with the tip in the SVC region. There continues to be a small left pleural effusion with left basilar atelectasis. No significant airspace disease or edema. Heart size is normal. The trachea is midline. Negative for a pneumothorax. Degenerative changes in the thoracic spine.  IMPRESSION: Small left pleural effusion with compressive atelectasis. Minimal change from prior CT.   Electronically Signed   By: Markus Daft M.D.   On: 04/18/2015 08:49   x-rays reviewed with Mrs.  Larson and her family  Medications: I have reviewed the patient's current medications.  Assessment/Plan: 1. Adenocarcinoma the pancreas, uncinate mass, EUS April 2015 revealed a uT3,uN1 lesion with an FNA biopsy confirming adenocarcinoma  MRI abdomen 07/04/2014 confirmed a pancreas uncinate 8 mass, 11 mm portacaval lymph node, no vascular involvement, and no evidence of distant metastatic disease   PET scan 07/20/2012 with a hypermetabolic uncinate process mass and hypermetabolic periportal lymph node   Markedly elevated CA 19-9   Negative diagnostic laparoscopy 07/24/2014   Initiation of Xeloda/radiation 08/10/2014.completed 09/21/2014.  CT 10/03/2014 confirmed a decrease in the pancreas mass, stable portacaval lymph node, and apparent involvement of the superior mesenteric artery  Pancreaticoduodenectomy on 11/02/2014 confirmed a poorly differentiated adenocarcinoma, ypT2,ypN0  Initiation of adjuvant gemcitabine 12/27/2014  Rising CA 19-07 February 2015  Left thoracentesis 03/08/2015 confirmed metastatic adenocarcinoma  CTs 03/16/2015 with a left pleural effusion and new right lung nodule  Cycle 1 FOLFIRINOX 03/21/2015  Cycle 2 FOLFIRINOX 04/04/2015 2. Hypertension 3. Hyperlipidemia  4. Vaginal spotting summer 2015-evaluated by gynecology  5. Zoster rash at June 2015  6. Asthma  7. Family history of pancreas cancer 8. Nausea following the pancreaticoduodenectomy procedure. Improved. 9. Anemia. 10. Admission with a fever 02/09/2015-no source for infection identified, treated for presumptive pneumonia 11. Left pleural effusion. Slightly larger on follow-up chest x-ray 03/06/2015. Status post a diagnostic/therapeutic thoracentesis 03/08/2015 cytology confirmed metastatic adenocarcinoma 12. Dysarthria/tongue numbness at the completion of oxaliplatin with cycle 1 FOLFIRINOX 03/21/2015-spontaneously resolved over hours, similar symptoms including diplopia following cycle  2   Disposition:  She has completed 2  cycles of FOLFIRINOX. She experienced acute neurologic symptoms following the oxaliplatin infusion with each cycle. The symptoms resolved spontaneously over a few hours. She understands this could occur with chemotherapy again today. The plan is to proceed with cycle 3 FOLFIRINOX today. The left pleural effusion has not progressed over the past month. We will follow-up on the CA 19-9 from today. [name] will return for an office visit and chemotherapy in 2 weeks. I encouraged her to use Imodium for diarrhea. She will receive additional potassium today. I reminded her to take potassium twice daily at home.  Betsy Coder, MD  04/18/2015  8:57 AM

## 2015-04-18 NOTE — Patient Instructions (Signed)
[facility] Discharge Instructions for Patients Receiving Chemotherapy  Today you received the following chemotherapy agents Oxaliplatin, Camptezar, Luecovorin, 46fu. To help prevent nausea and vomiting after your treatment, we encourage you to take your nausea medication as prescribed by your physician.   If you develop nausea and vomiting that is not controlled by your nausea medication, call the clinic.   BELOW ARE SYMPTOMS THAT SHOULD BE REPORTED IMMEDIATELY:  *FEVER GREATER THAN 100.5 F  *CHILLS WITH OR WITHOUT FEVER  NAUSEA AND VOMITING THAT IS NOT CONTROLLED WITH YOUR NAUSEA MEDICATION  *UNUSUAL SHORTNESS OF BREATH  *UNUSUAL BRUISING OR BLEEDING  TENDERNESS IN MOUTH AND THROAT WITH OR WITHOUT PRESENCE OF ULCERS  *URINARY PROBLEMS  *BOWEL PROBLEMS  UNUSUAL RASH Items with * indicate a potential emergency and should be followed up as soon as possible.  Feel free to call the clinic you have any questions or concerns. The clinic phone number is [phone].  Please show the [facility] at check-in to the Emergency Department and triage nurse.

## 2015-04-18 NOTE — Patient Instructions (Signed)

## 2015-04-18 NOTE — Progress Notes (Signed)
1355: Patient complaining that my "fingers aren't working with my I-pad". Patient's speech unclear. Patient remained alert and oriented. O2 @ 2 liters started, Solumedrol 125 mg  IVP, Benadryl 25 mg IVP  and Pepcid 20 mg  IVPB administered per protocal. Lynnda Shields PA at bedside.   1605: Patient states that she is feeling better.  1615: 02 Sat 100% on room air.

## 2015-04-19 ENCOUNTER — Encounter: Payer: Self-pay | Admitting: Family Medicine

## 2015-04-19 ENCOUNTER — Ambulatory Visit (INDEPENDENT_AMBULATORY_CARE_PROVIDER_SITE_OTHER): Payer: Medicare Other | Admitting: Family Medicine

## 2015-04-19 VITALS — BP 130/80 | Temp 97.6°F | Wt 153.0 lb

## 2015-04-19 DIAGNOSIS — I10 Essential (primary) hypertension: Secondary | ICD-10-CM | POA: Diagnosis not present

## 2015-04-19 NOTE — Progress Notes (Signed)
Addendum: Patient experienced some mouth numbness/tingling; and some mild difficulty forming words after completing the oxaliplatin portion of her chemo today. She denies any vision issues today.  Patient was given hypersensitivity medications per protocol; and symptoms did partially resolve while at the cancer center.  She denies any tingling/numbness to her mouth or tongue at this time; but patient is noted to be concentrating to pronounce her words correctly. Patient denies any other new symptoms whatsoever. Patient with no obvious weakness on exam. Vital signs remained stable.  Patient has a history of the same reaction with the completion of each of her oxaliplatin infusion; despite receiving aggressive premedications.  Patient was able to complete all of her chemotherapy today with no further incidents.

## 2015-04-19 NOTE — Progress Notes (Signed)
Subjective:    Patient ID: Sonya Larson, female    DOB: [birth year], 64 y.o.   MRN: [id number]  HPI Patient has metastatic pancreatic cancer. She is currently receiving chemotherapy. She's had gradual weight loss. She has hypertension treated with lisinopril and HCTZ but she has been off all of her blood pressure medicines over the past week. She is not monitoring blood pressures currently at home. She's had some problems with hypokalemia. She was on HCTZ. She is not have any significant peripheral edema issues. She also takes Lipitor for hyperlipidemia. No myalgias.  Past Medical History  Diagnosis Date  . Chicken pox   . Depression   . Hypertension   . Hyperlipidemia   . Polyp of colon   . Shingles JUNE 2015    RIGHT  SHOULDER  . Anxiety     since diagnosis  . Allergy   . Hx of radiation therapy 08/10/14-09/21/14    pancreas 50.4Gy/18fx  . Asthma     exercise induced, seasonal   . GERD (gastroesophageal reflux disease)     uses TUM  PRN, since chemo-   . Arthritis     mild, hips   . Status post PICC central line placement     10/2014  . Pancreatic cancer 07/12/14    Adenocarcinoma. Chemo/ radiation completed early 9'15-Dr. Sherril follows   Past Surgical History  Procedure Laterality Date  . Breast reduction surgery Bilateral   . Colonscopy   LAST DONE SEPT 2014    X 3  . Hysteroscopy  2005    "heavy menopause"  . Eus N/A 07/12/2014    Procedure: ESOPHAGEAL ENDOSCOPIC ULTRASOUND (EUS) RADIAL;  Surgeon: Arta Silence, MD;  Location: [facility];  Service: Endoscopy;  Laterality: N/A;  . Fine needle aspiration N/A 07/12/2014    Procedure: FINE NEEDLE ASPIRATION (FNA) RADIAL;  Surgeon: Arta Silence, MD;  Location: [facility];  Service: Endoscopy;  Laterality: N/A;  . Laparoscopy N/A 07/24/2014    Procedure: LAPAROSCOPY DIAGNOSTIC;  Surgeon: Stark Klein, MD;  Location: [facility];  Service: General;  Laterality: N/A;-"no unusual fingings"  . Eus N/A 10/25/2014   Procedure: ESOPHAGEAL ENDOSCOPIC ULTRASOUND (EUS) RADIAL;  Surgeon: Arta Silence, MD;  Location: [facility];  Service: Endoscopy;  Laterality: N/A;  . Fine needle aspiration N/A 10/25/2014    Procedure: FINE NEEDLE ASPIRATION (FNA) LINEAR;  Surgeon: Arta Silence, MD;  Location: [facility];  Service: Endoscopy;  Laterality: N/A;  . Laparoscopy N/A 11/02/2014    Procedure: LAPAROSCOPY DIAGNOSTIC;  Surgeon: Stark Klein, MD;  Location: [facility];  Service: General;  Laterality: N/A;  . Whipple procedure N/A 11/02/2014    Procedure: WHIPPLE PROCEDURE;  Surgeon: Stark Klein, MD;  Location: [facility];  Service: General;  Laterality: N/A;  . Portacath placement Left 12/13/2014    Procedure: INSERTION PORT-A-CATH;  Surgeon: Stark Klein, MD;  Location: [facility];  Service: General;  Laterality: Left;    reports that she has never smoked. She has never used smokeless tobacco. She reports that she drinks alcohol. She reports that she does not use illicit drugs. family history includes Alcohol abuse in her father; Arthritis in her mother; Cancer in her cousin and father; Hypertension in her mother. No Known Allergies    Review of Systems  Constitutional: Positive for fatigue. Negative for fever and chills.  Eyes: Negative for visual disturbance.  Respiratory: Negative for cough, chest tightness, shortness of breath and wheezing.   Cardiovascular: Negative for chest pain, palpitations and leg swelling.  Neurological: Negative for dizziness, seizures, syncope, weakness, light-headedness and headaches.       Objective:   Physical Exam  Constitutional: She appears well-developed and well-nourished.  Cardiovascular: Normal rate and regular rhythm.   Pulmonary/Chest: Breath sounds normal. No respiratory distress. She has no wheezes. She has no rales.  Musculoskeletal: She exhibits no edema.          Assessment & Plan:  Hypertension. Currently well controlled with repeat reading  today left arm seated 122/80-and pt off BP medications during the past week. We recommended discontinuing HCTZ. Monitor blood pressure closely at home. If consistently less than 150/90 continue to hold lisinopril as well. If consistently above 150/90 no go back on lisinopril 20 mg daily. She has follow-up labs with oncology and 2 weeks and will get potassium reassessed then.  She does remain on K supplement

## 2015-04-19 NOTE — Progress Notes (Signed)
Pre visit review using our clinic review tool, if applicable. No additional management support is needed unless otherwise documented below in the visit note. 

## 2015-04-19 NOTE — Patient Instructions (Addendum)
Stop the HCTZ Monitor blood pressure at home and if consistently > 150/90 go back on one half Lisinopril daily.

## 2015-04-20 ENCOUNTER — Telehealth: Payer: Self-pay | Admitting: *Deleted

## 2015-04-20 ENCOUNTER — Telehealth: Payer: Self-pay | Admitting: Oncology

## 2015-04-20 ENCOUNTER — Ambulatory Visit (HOSPITAL_BASED_OUTPATIENT_CLINIC_OR_DEPARTMENT_OTHER): Payer: Medicare Other

## 2015-04-20 ENCOUNTER — Ambulatory Visit: Payer: Medicare Other

## 2015-04-20 VITALS — BP 146/93 | HR 86 | Temp 97.9°F | Resp 16

## 2015-04-20 DIAGNOSIS — C257 Malignant neoplasm of other parts of pancreas: Secondary | ICD-10-CM

## 2015-04-20 DIAGNOSIS — C250 Malignant neoplasm of head of pancreas: Secondary | ICD-10-CM

## 2015-04-20 MED ORDER — SODIUM CHLORIDE 0.9 % IJ SOLN
10.0000 mL | INTRAMUSCULAR | Status: DC | PRN
  Administered 2015-04-20: 10 mL
  Filled 2015-04-20: qty 10

## 2015-04-20 MED ORDER — HEPARIN SOD (PORK) LOCK FLUSH 100 UNIT/ML IV SOLN
500.0000 [IU] | Freq: Once | INTRAVENOUS | Status: AC | PRN
  Administered 2015-04-20: 500 [IU]
  Filled 2015-04-20: qty 5

## 2015-04-20 MED ORDER — PEGFILGRASTIM INJECTION 6 MG/0.6ML ~~LOC~~
6.0000 mg | PREFILLED_SYRINGE | Freq: Once | SUBCUTANEOUS | Status: AC
Start: 2015-04-20 — End: 2015-04-20
  Administered 2015-04-20: 6 mg via SUBCUTANEOUS
  Filled 2015-04-20: qty 0.6

## 2015-04-20 NOTE — Telephone Encounter (Signed)
Appointments made and per pof patient will get a new schedule today

## 2015-04-20 NOTE — Telephone Encounter (Signed)
Left message on voicemail informing pt of change in appointment times due to long infusion. Requested she stop at scheduling for new printout.

## 2015-04-20 NOTE — Progress Notes (Signed)
Neulasta injection given by flush nurse 

## 2015-04-20 NOTE — Patient Instructions (Signed)
Pegfilgrastim injection What is this medicine? PEGFILGRASTIM (peg fil GRA stim) is a long-acting granulocyte colony-stimulating factor that stimulates the growth of neutrophils, a type of white blood cell important in the body's fight against infection. It is used to reduce the incidence of fever and infection in patients with certain types of cancer who are receiving chemotherapy that affects the bone marrow. This medicine may be used for other purposes; ask your health care provider or pharmacist if you have questions. COMMON BRAND NAME(S): Neulasta What should I tell my health care provider before I take this medicine? They need to know if you have any of these conditions: -latex allergy -ongoing radiation therapy -sickle cell disease -skin reactions to acrylic adhesives (On-Body Injector only) -an unusual or allergic reaction to pegfilgrastim, filgrastim, other medicines, foods, dyes, or preservatives -pregnant or trying to get pregnant -breast-feeding How should I use this medicine? This medicine is for injection under the skin. If you get this medicine at home, you will be taught how to prepare and give the pre-filled syringe or how to use the On-body Injector. Refer to the patient Instructions for Use for detailed instructions. Use exactly as directed. Take your medicine at regular intervals. Do not take your medicine more often than directed. It is important that you put your used needles and syringes in a special sharps container. Do not put them in a trash can. If you do not have a sharps container, call your pharmacist or healthcare provider to get one. Talk to your pediatrician regarding the use of this medicine in children. Special care may be needed. Overdosage: If you think you have taken too much of this medicine contact a poison control center or emergency room at once. NOTE: This medicine is only for you. Do not share this medicine with others. What if I miss a dose? It is  important not to miss your dose. Call your doctor or health care professional if you miss your dose. If you miss a dose due to an On-body Injector failure or leakage, a new dose should be administered as soon as possible using a single prefilled syringe for manual use. What may interact with this medicine? Interactions have not been studied. Give your health care provider a list of all the medicines, herbs, non-prescription drugs, or dietary supplements you use. Also tell them if you smoke, drink alcohol, or use illegal drugs. Some items may interact with your medicine. This list may not describe all possible interactions. Give your health care provider a list of all the medicines, herbs, non-prescription drugs, or dietary supplements you use. Also tell them if you smoke, drink alcohol, or use illegal drugs. Some items may interact with your medicine. What should I watch for while using this medicine? You may need blood work done while you are taking this medicine. If you are going to need a MRI, CT scan, or other procedure, tell your doctor that you are using this medicine (On-Body Injector only). What side effects may I notice from receiving this medicine? Side effects that you should report to your doctor or health care professional as soon as possible: -allergic reactions like skin rash, itching or hives, swelling of the face, lips, or tongue -dizziness -fever -pain, redness, or irritation at site where injected -pinpoint red spots on the skin -shortness of breath or breathing problems -stomach or side pain, or pain at the shoulder -swelling -tiredness -trouble passing urine Side effects that usually do not require medical attention (report to your doctor   or health care professional if they continue or are bothersome): -bone pain -muscle pain This list may not describe all possible side effects. Call your doctor for medical advice about side effects. You may report side effects to FDA at  1-800-FDA-1088. Where should I keep my medicine? Keep out of the reach of children. Store pre-filled syringes in a refrigerator between 2 and 8 degrees C (36 and 46 degrees F). Do not freeze. Keep in carton to protect from light. Throw away this medicine if it is left out of the refrigerator for more than 48 hours. Throw away any unused medicine after the expiration date. NOTE: This sheet is a summary. It may not cover all possible information. If you have questions about this medicine, talk to your doctor, pharmacist, or health care provider.  2015, Elsevier/Gold Standard. (2014-02-16 16:14:05)  

## 2015-04-20 NOTE — Telephone Encounter (Signed)
Patient stopped by after treatment for June schedule.

## 2015-04-24 ENCOUNTER — Other Ambulatory Visit: Payer: Self-pay | Admitting: Oncology

## 2015-04-30 ENCOUNTER — Other Ambulatory Visit: Payer: Self-pay | Admitting: Oncology

## 2015-05-01 ENCOUNTER — Other Ambulatory Visit: Payer: Self-pay | Admitting: Oncology

## 2015-05-02 ENCOUNTER — Other Ambulatory Visit (HOSPITAL_BASED_OUTPATIENT_CLINIC_OR_DEPARTMENT_OTHER): Payer: Medicare Other

## 2015-05-02 ENCOUNTER — Ambulatory Visit: Payer: Medicare Other

## 2015-05-02 ENCOUNTER — Ambulatory Visit: Payer: Medicare Other | Admitting: Nurse Practitioner

## 2015-05-02 ENCOUNTER — Telehealth: Payer: Self-pay | Admitting: Oncology

## 2015-05-02 ENCOUNTER — Ambulatory Visit (HOSPITAL_BASED_OUTPATIENT_CLINIC_OR_DEPARTMENT_OTHER): Payer: Medicare Other | Admitting: Oncology

## 2015-05-02 ENCOUNTER — Ambulatory Visit (HOSPITAL_COMMUNITY)
Admission: RE | Admit: 2015-05-02 | Discharge: 2015-05-02 | Disposition: A | Discharge status: Home / Self Care | Payer: Medicare Other | Source: Ambulatory Visit | Attending: Oncology | Admitting: Oncology

## 2015-05-02 ENCOUNTER — Ambulatory Visit (HOSPITAL_BASED_OUTPATIENT_CLINIC_OR_DEPARTMENT_OTHER): Payer: Medicare Other

## 2015-05-02 ENCOUNTER — Other Ambulatory Visit: Payer: Medicare Other

## 2015-05-02 VITALS — BP 119/84 | HR 110 | Temp 98.5°F | Resp 18 | Ht 63.0 in | Wt 150.1 lb

## 2015-05-02 DIAGNOSIS — Z5111 Encounter for antineoplastic chemotherapy: Secondary | ICD-10-CM

## 2015-05-02 DIAGNOSIS — Z95828 Presence of other vascular implants and grafts: Secondary | ICD-10-CM

## 2015-05-02 DIAGNOSIS — C250 Malignant neoplasm of head of pancreas: Secondary | ICD-10-CM | POA: Diagnosis present

## 2015-05-02 DIAGNOSIS — D649 Anemia, unspecified: Secondary | ICD-10-CM | POA: Diagnosis not present

## 2015-05-02 LAB — CBC WITH DIFFERENTIAL/PLATELET
BASO%: 0.1 % (ref 0.0–2.0)
Basophils Absolute: 0 10*3/uL (ref 0.0–0.1)
EOS%: 0.4 % (ref 0.0–7.0)
Eosinophils Absolute: 0.1 10*3/uL (ref 0.0–0.5)
HEMATOCRIT: 22 % — AB (ref 34.8–46.6)
HGB: 6.9 g/dL — CL (ref 11.6–15.9)
LYMPH#: 0.5 10*3/uL — AB (ref 0.9–3.3)
LYMPH%: 3.3 % — ABNORMAL LOW (ref 14.0–49.7)
MCH: 30.3 pg (ref 25.1–34.0)
MCHC: 31.4 g/dL — AB (ref 31.5–36.0)
MCV: 96.5 fL (ref 79.5–101.0)
MONO#: 1.7 10*3/uL — AB (ref 0.1–0.9)
MONO%: 12.4 % (ref 0.0–14.0)
NEUT%: 83.8 % — ABNORMAL HIGH (ref 38.4–76.8)
NEUTROS ABS: 11.5 10*3/uL — AB (ref 1.5–6.5)
PLATELETS: 162 10*3/uL (ref 145–400)
RBC: 2.28 10*6/uL — AB (ref 3.70–5.45)
RDW: 20.4 % — ABNORMAL HIGH (ref 11.2–14.5)
WBC: 13.8 10*3/uL — AB (ref 3.9–10.3)
nRBC: 1 % — ABNORMAL HIGH (ref 0–0)

## 2015-05-02 LAB — COMPREHENSIVE METABOLIC PANEL (CC13)
ALK PHOS: 327 U/L — AB (ref 40–150)
ALT: 48 U/L (ref 0–55)
AST: 72 U/L — ABNORMAL HIGH (ref 5–34)
Albumin: 2.6 g/dL — ABNORMAL LOW (ref 3.5–5.0)
Anion Gap: 10 mEq/L (ref 3–11)
BUN: 9.8 mg/dL (ref 7.0–26.0)
CALCIUM: 7.9 mg/dL — AB (ref 8.4–10.4)
CO2: 23 meq/L (ref 22–29)
Chloride: 112 mEq/L — ABNORMAL HIGH (ref 98–109)
Creatinine: 0.7 mg/dL (ref 0.6–1.1)
EGFR: 85 mL/min/{1.73_m2} — ABNORMAL LOW (ref >90)
Glucose: 118 mg/dl (ref 70–140)
POTASSIUM: 3 meq/L — AB (ref 3.5–5.1)
SODIUM: 144 meq/L (ref 136–145)
TOTAL PROTEIN: 5.6 g/dL — AB (ref 6.4–8.3)
Total Bilirubin: 0.45 mg/dL (ref 0.20–1.20)

## 2015-05-02 LAB — PREPARE RBC (CROSSMATCH)

## 2015-05-02 MED ORDER — ALTEPLASE 2 MG IJ SOLR
2.0000 mg | Freq: Once | INTRAMUSCULAR | Status: DC | PRN
Start: 2015-05-02 — End: 2015-05-02
  Filled 2015-05-02: qty 2

## 2015-05-02 MED ORDER — ATROPINE SULFATE 1 MG/ML IJ SOLN
0.5000 mg | Freq: Once | INTRAMUSCULAR | Status: AC | PRN
  Administered 2015-05-02: 0.5 mg via INTRAVENOUS

## 2015-05-02 MED ORDER — IRINOTECAN HCL CHEMO INJECTION 100 MG/5ML
180.0000 mg/m2 | Freq: Once | INTRAVENOUS | Status: AC
  Administered 2015-05-02: 324 mg via INTRAVENOUS
  Filled 2015-05-02: qty 16.2

## 2015-05-02 MED ORDER — ATROPINE SULFATE 1 MG/ML IJ SOLN
INTRAMUSCULAR | Status: AC
  Filled 2015-05-02: qty 1

## 2015-05-02 MED ORDER — SODIUM CHLORIDE 0.9 % IJ SOLN
3.0000 mL | INTRAMUSCULAR | Status: DC | PRN
  Filled 2015-05-02: qty 10

## 2015-05-02 MED ORDER — HEPARIN SOD (PORK) LOCK FLUSH 100 UNIT/ML IV SOLN
250.0000 [IU] | Freq: Once | INTRAVENOUS | Status: DC | PRN
  Filled 2015-05-02: qty 5

## 2015-05-02 MED ORDER — DEXTROSE 5 % IV SOLN
Freq: Once | INTRAVENOUS | Status: AC
  Administered 2015-05-02: 11:00:00 via INTRAVENOUS

## 2015-05-02 MED ORDER — OXALIPLATIN CHEMO INJECTION 100 MG/20ML
83.0000 mg/m2 | Freq: Once | INTRAVENOUS | Status: AC
  Administered 2015-05-02: 150 mg via INTRAVENOUS
  Filled 2015-05-02: qty 30

## 2015-05-02 MED ORDER — SODIUM CHLORIDE 0.9 % IV SOLN
Freq: Once | INTRAVENOUS | Status: AC
  Administered 2015-05-02: 11:00:00 via INTRAVENOUS
  Filled 2015-05-02: qty 8

## 2015-05-02 MED ORDER — SODIUM CHLORIDE 0.9 % IJ SOLN
10.0000 mL | INTRAMUSCULAR | Status: DC | PRN
  Administered 2015-05-02: 10 mL via INTRAVENOUS
  Filled 2015-05-02: qty 10

## 2015-05-02 MED ORDER — SODIUM CHLORIDE 0.9 % IV SOLN
2400.0000 mg/m2 | INTRAVENOUS | Status: DC
  Administered 2015-05-02: 4300 mg via INTRAVENOUS
  Filled 2015-05-02: qty 86

## 2015-05-02 MED ORDER — HEPARIN SOD (PORK) LOCK FLUSH 100 UNIT/ML IV SOLN
500.0000 [IU] | Freq: Once | INTRAVENOUS | Status: DC | PRN
  Filled 2015-05-02: qty 5

## 2015-05-02 MED ORDER — LEUCOVORIN CALCIUM INJECTION 350 MG
400.0000 mg/m2 | Freq: Once | INTRAVENOUS | Status: AC
  Administered 2015-05-02: 720 mg via INTRAVENOUS
  Filled 2015-05-02: qty 36

## 2015-05-02 MED ORDER — OXALIPLATIN CHEMO INJECTION 100 MG/20ML: 83.0000 mg/m2 | Freq: Once | INTRAVENOUS | Status: DC

## 2015-05-02 MED ORDER — SODIUM CHLORIDE 0.9 % IJ SOLN
10.0000 mL | INTRAMUSCULAR | Status: DC | PRN
  Filled 2015-05-02: qty 10

## 2015-05-02 NOTE — Patient Instructions (Signed)

## 2015-05-02 NOTE — Telephone Encounter (Signed)
Gave patient avs report and appointments for June and July.  °

## 2015-05-02 NOTE — Progress Notes (Signed)
CMET reviewed with Dr. Benay Spice. Potassium 3.0. Pt taking potassium BID. Per MD, will check magnesium level and call pt with instructions on 6/2.

## 2015-05-02 NOTE — Progress Notes (Signed)
1113 Per Dr.Sherrill, give Oxaliplatin over 3 hours. 1618 Notified Dr.Sherrill of pt reporting "tounge feeling funny like before and having some muscle twitching in upper thighs". Dr.Sherrill arrived to assess pt, per MD continue with treatment. Pt to increase potassium to three times a day. PT/CG verbalizes understanding.

## 2015-05-02 NOTE — Patient Instructions (Signed)
[facility] Discharge Instructions for Patients Receiving Chemotherapy  Today you received the following chemotherapy agents Oxaliplatin, Iriontecan, 5FU and Leucovorin.   To help prevent nausea and vomiting after your treatment, we encourage you to take your nausea medication as prescribed.   If you develop nausea and vomiting that is not controlled by your nausea medication, call the clinic.   BELOW ARE SYMPTOMS THAT SHOULD BE REPORTED IMMEDIATELY:  *FEVER GREATER THAN 100.5 F  *CHILLS WITH OR WITHOUT FEVER  NAUSEA AND VOMITING THAT IS NOT CONTROLLED WITH YOUR NAUSEA MEDICATION  *UNUSUAL SHORTNESS OF BREATH  *UNUSUAL BRUISING OR BLEEDING  TENDERNESS IN MOUTH AND THROAT WITH OR WITHOUT PRESENCE OF ULCERS  *URINARY PROBLEMS  *BOWEL PROBLEMS  UNUSUAL RASH Items with * indicate a potential emergency and should be followed up as soon as possible.  Feel free to call the clinic you have any questions or concerns. The clinic phone number is [phone].  Please show the [facility] at check-in to the Emergency Department and triage nurse.

## 2015-05-02 NOTE — Progress Notes (Signed)
[facility] OFFICE PROGRESS NOTE   Diagnosis: Pancreas cancer  INTERVAL HISTORY:   Sonya Larson completed another cycle FOLFIRINOX 04/18/2015. She reports minimal dysarthria at the end of the oxaliplatin infusion. She has cold sensitivity at the lips and hands. She reports anorexia and malaise during the week following chemotherapy. Good appetite at present. She is getting out of the house. She had dyspnea during the week following chemotherapy. Minimal hemorrhoid bleeding. No other bleeding. No pain following the Neulasta injection.  Objective:  Vital signs in last 24 hours:  Blood pressure 119/84, pulse 110, temperature 98.5 F (36.9 C), temperature source Oral, resp. rate 18, height 5\' 3"  (1.6 m), weight 150 lb 1.6 oz (68.085 kg), last menstrual period 10/31/2005.    HEENT: No thrush or ulcers Resp: Lungs clear bilaterally Cardio: Regular rate and rhythm GI: No hepatomegaly, nontender Vascular: The right lower leg is slightly larger than the left side, no edema Neuro: Very mild decrease in vibratory sense at several of the fingertips bilaterally, others are normal    Portacath/PICC-without erythema  Lab Results:  Lab Results  Component Value Date   WBC 13.8* 05/02/2015   HGB 6.9* 05/02/2015   HCT 22.0* 05/02/2015   MCV 96.5 05/02/2015   PLT 162 05/02/2015   NEUTROABS 11.5* 05/02/2015   CA 19-9 on 04/18/2015: 2338  Medications: I have reviewed the patient's current medications.  Assessment/Plan: 1. Adenocarcinoma the pancreas, uncinate mass, EUS April 2015 revealed a uT3,uN1 lesion with an FNA biopsy confirming adenocarcinoma  MRI abdomen 07/04/2014 confirmed a pancreas uncinate 8 mass, 11 mm portacaval lymph node, no vascular involvement, and no evidence of distant metastatic disease   PET scan 07/20/2012 with a hypermetabolic uncinate process mass and hypermetabolic periportal lymph node   Markedly elevated CA 19-9   Negative diagnostic  laparoscopy 07/24/2014   Initiation of Xeloda/radiation 08/10/2014.completed 09/21/2014.  CT 10/03/2014 confirmed a decrease in the pancreas mass, stable portacaval lymph node, and apparent involvement of the superior mesenteric artery  Pancreaticoduodenectomy on 11/02/2014 confirmed a poorly differentiated adenocarcinoma, ypT2,ypN0  Initiation of adjuvant gemcitabine 12/27/2014  Rising CA 19-07 February 2015  Left thoracentesis 03/08/2015 confirmed metastatic adenocarcinoma  CTs 03/16/2015 with a left pleural effusion and new right lung nodule  Cycle 1 FOLFIRINOX 03/21/2015  Cycle 2 FOLFIRINOX 04/04/2015 2. Hypertension 3. Hyperlipidemia  4. Vaginal spotting summer 2015-evaluated by gynecology  5. Zoster rash at June 2015  6. Asthma  7. Family history of pancreas cancer 8. Nausea following the pancreaticoduodenectomy procedure. Improved. 9. Anemia secondary to chemotherapy 10. Admission with a fever 02/09/2015-no source for infection identified, treated for presumptive pneumonia 11. Left pleural effusion. Slightly larger on follow-up chest x-ray 03/06/2015. Status post a diagnostic/therapeutic thoracentesis 03/08/2015 cytology confirmed metastatic adenocarcinoma 12. Dysarthria/tongue numbness at the completion of oxaliplatin with cycle 1 FOLFIRINOX 03/21/2015-spontaneously resolved over hours, similar symptoms including diplopia following cycle 2   Disposition:  Ms. Nason has completed 3 cycles of FOLFIRINOX. She has tolerated the chemotherapy well aside from acute neurologic symptoms at the completion of the oxaliplatin infusions. The CA-19-9 was lower after cycle 2. The plan is to proceed with cycle 3 today.  She has developed severe anemia. This is likely secondary to chemotherapy. We will arrange for a red cell transfusion given the likelihood of progressive anemia following this cycle and her malaise.  She will return for an office visit, chest x-ray, and cycle 5  FOLFIRINOX in 2 weeks.  Betsy Coder, MD  05/02/2015  10:04 AM

## 2015-05-02 NOTE — Progress Notes (Signed)
Per Dr. Benay Spice: Treat despite HGB 6.9. Pt to be transfused this week. Danna, Infusion RN made aware.

## 2015-05-02 NOTE — [facility] Oncology Navigator Note (Signed)
Oncology Nurse Navigator Documentation  Oncology Nurse Navigator Flowsheets 05/02/2015  Navigator Encounter Type Treatment  Patient Visit Type Medonc  Treatment Phase Treatment--  Barriers/Navigation Needs No barriers at this time: strong family and church support; occasional depression and tears, but "not for long". Has been getting out of the house w/husband's encouragement. Provided with flyer on Survivor Celebration Day.  Support Groups/Services GI-provided handout and invited her to come to June meeting.  Time Spent with Patient 15

## 2015-05-03 ENCOUNTER — Telehealth: Payer: Self-pay | Admitting: *Deleted

## 2015-05-03 LAB — CANCER ANTIGEN 19-9: CA 19 9: 1499.2 U/mL — AB (ref <35.0)

## 2015-05-03 NOTE — Telephone Encounter (Signed)
My chart message sent about mammogram.

## 2015-05-03 NOTE — Telephone Encounter (Signed)
-----   Message from Ladell Pier, MD sent at 05/03/2015  7:44 AM EDT ----- Please call patient, ca19-9 is better

## 2015-05-03 NOTE — Telephone Encounter (Signed)
Called pt with CA19.9 results: better, per Dr. Benay Spice. Pt voiced understanding. She reports nausea today. Taking Compazine. Instructed her to take Zofran Q 8 hours in addition to Compazine. Call if nausea persists. She voiced understanding.

## 2015-05-04 ENCOUNTER — Ambulatory Visit: Payer: Medicare Other

## 2015-05-04 ENCOUNTER — Ambulatory Visit (HOSPITAL_BASED_OUTPATIENT_CLINIC_OR_DEPARTMENT_OTHER): Payer: Medicare Other

## 2015-05-04 VITALS — BP 145/84 | HR 77 | Temp 99.2°F | Resp 17

## 2015-05-04 DIAGNOSIS — C250 Malignant neoplasm of head of pancreas: Secondary | ICD-10-CM | POA: Diagnosis not present

## 2015-05-04 DIAGNOSIS — C257 Malignant neoplasm of other parts of pancreas: Secondary | ICD-10-CM | POA: Diagnosis present

## 2015-05-04 MED ORDER — HEPARIN SOD (PORK) LOCK FLUSH 100 UNIT/ML IV SOLN
500.0000 [IU] | Freq: Once | INTRAVENOUS | Status: AC | PRN
  Administered 2015-05-04: 500 [IU]
  Filled 2015-05-04: qty 5

## 2015-05-04 MED ORDER — SODIUM CHLORIDE 0.9 % IJ SOLN
10.0000 mL | INTRAMUSCULAR | Status: DC | PRN
  Filled 2015-05-04: qty 10

## 2015-05-04 MED ORDER — SODIUM CHLORIDE 0.9 % IJ SOLN
10.0000 mL | INTRAMUSCULAR | Status: AC | PRN
  Administered 2015-05-04: 10 mL
  Filled 2015-05-04: qty 10

## 2015-05-04 MED ORDER — SODIUM CHLORIDE 0.9 % IV SOLN
250.0000 mL | Freq: Once | INTRAVENOUS | Status: AC
  Administered 2015-05-04: 250 mL via INTRAVENOUS

## 2015-05-04 MED ORDER — PEGFILGRASTIM INJECTION 6 MG/0.6ML ~~LOC~~
6.0000 mg | PREFILLED_SYRINGE | Freq: Once | SUBCUTANEOUS | Status: AC
  Administered 2015-05-04: 6 mg via SUBCUTANEOUS
  Filled 2015-05-04: qty 0.6

## 2015-05-04 NOTE — Patient Instructions (Signed)

## 2015-05-04 NOTE — Progress Notes (Signed)
Neulasta injection given by infusion nurse after receiving a blood transfusion

## 2015-05-06 LAB — TYPE AND SCREEN
ABO/RH(D): O POS
Antibody Screen: NEGATIVE
UNIT DIVISION: 0
Unit division: 0

## 2015-05-13 ENCOUNTER — Other Ambulatory Visit: Payer: Self-pay | Admitting: Oncology

## 2015-05-15 ENCOUNTER — Telehealth: Payer: Self-pay | Admitting: *Deleted

## 2015-05-15 NOTE — Telephone Encounter (Signed)
Called pt to remind her to have chest Xray prior to visit on 6/15. She was aware.

## 2015-05-16 ENCOUNTER — Ambulatory Visit (HOSPITAL_BASED_OUTPATIENT_CLINIC_OR_DEPARTMENT_OTHER): Payer: Medicare Other

## 2015-05-16 ENCOUNTER — Ambulatory Visit: Payer: Medicare Other

## 2015-05-16 ENCOUNTER — Other Ambulatory Visit (HOSPITAL_BASED_OUTPATIENT_CLINIC_OR_DEPARTMENT_OTHER): Payer: Medicare Other

## 2015-05-16 ENCOUNTER — Ambulatory Visit (HOSPITAL_COMMUNITY)
Admission: RE | Admit: 2015-05-16 | Discharge: 2015-05-16 | Disposition: A | Discharge status: Home / Self Care | Payer: Medicare Other | Source: Ambulatory Visit | Attending: Oncology | Admitting: Oncology

## 2015-05-16 ENCOUNTER — Ambulatory Visit (HOSPITAL_BASED_OUTPATIENT_CLINIC_OR_DEPARTMENT_OTHER): Payer: Medicare Other | Admitting: Oncology

## 2015-05-16 VITALS — BP 149/91 | HR 91 | Temp 98.4°F | Resp 18 | Ht 63.0 in | Wt 150.5 lb

## 2015-05-16 DIAGNOSIS — G620 Drug-induced polyneuropathy: Secondary | ICD-10-CM

## 2015-05-16 DIAGNOSIS — C257 Malignant neoplasm of other parts of pancreas: Secondary | ICD-10-CM

## 2015-05-16 DIAGNOSIS — D6959 Other secondary thrombocytopenia: Secondary | ICD-10-CM | POA: Diagnosis not present

## 2015-05-16 DIAGNOSIS — C250 Malignant neoplasm of head of pancreas: Secondary | ICD-10-CM

## 2015-05-16 DIAGNOSIS — J90 Pleural effusion, not elsewhere classified: Secondary | ICD-10-CM | POA: Diagnosis present

## 2015-05-16 DIAGNOSIS — E876 Hypokalemia: Secondary | ICD-10-CM

## 2015-05-16 DIAGNOSIS — Z5111 Encounter for antineoplastic chemotherapy: Secondary | ICD-10-CM

## 2015-05-16 DIAGNOSIS — Z95828 Presence of other vascular implants and grafts: Secondary | ICD-10-CM

## 2015-05-16 LAB — CANCER ANTIGEN 19-9: CA 19-9: 343.6 U/mL — ABNORMAL HIGH (ref <35.0)

## 2015-05-16 LAB — COMPREHENSIVE METABOLIC PANEL (CC13)
ALT: 38 U/L (ref 0–55)
ANION GAP: 9 meq/L (ref 3–11)
AST: 47 U/L — ABNORMAL HIGH (ref 5–34)
Albumin: 2.2 g/dL — ABNORMAL LOW (ref 3.5–5.0)
Alkaline Phosphatase: 291 U/L — ABNORMAL HIGH (ref 40–150)
BUN: 5.2 mg/dL — ABNORMAL LOW (ref 7.0–26.0)
CALCIUM: 7.9 mg/dL — AB (ref 8.4–10.4)
CHLORIDE: 115 meq/L — AB (ref 98–109)
CO2: 20 mEq/L — ABNORMAL LOW (ref 22–29)
Creatinine: 0.7 mg/dL (ref 0.6–1.1)
EGFR: >90 mL/min/{1.73_m2} (ref >90)
Glucose: 130 mg/dl (ref 70–140)
POTASSIUM: 2.8 meq/L — AB (ref 3.5–5.1)
Sodium: 144 mEq/L (ref 136–145)
TOTAL PROTEIN: 5 g/dL — AB (ref 6.4–8.3)
Total Bilirubin: 0.37 mg/dL (ref 0.20–1.20)

## 2015-05-16 LAB — CBC WITH DIFFERENTIAL/PLATELET
BASO%: 0.3 % (ref 0.0–2.0)
Basophils Absolute: 0 10*3/uL (ref 0.0–0.1)
EOS%: 0.4 % (ref 0.0–7.0)
Eosinophils Absolute: 0 10*3/uL (ref 0.0–0.5)
HCT: 26.6 % — ABNORMAL LOW (ref 34.8–46.6)
HGB: 8.5 g/dL — ABNORMAL LOW (ref 11.6–15.9)
LYMPH%: 5.7 % — ABNORMAL LOW (ref 14.0–49.7)
MCH: 30.6 pg (ref 25.1–34.0)
MCHC: 32 g/dL (ref 31.5–36.0)
MCV: 95.7 fL (ref 79.5–101.0)
MONO#: 0.8 10*3/uL (ref 0.1–0.9)
MONO%: 12.2 % (ref 0.0–14.0)
NEUT%: 81.4 % — ABNORMAL HIGH (ref 38.4–76.8)
NEUTROS ABS: 5.6 10*3/uL (ref 1.5–6.5)
Platelets: 88 10*3/uL — ABNORMAL LOW (ref 145–400)
RBC: 2.78 10*6/uL — ABNORMAL LOW (ref 3.70–5.45)
RDW: 18.9 % — AB (ref 11.2–14.5)
WBC: 6.8 10*3/uL (ref 3.9–10.3)
lymph#: 0.4 10*3/uL — ABNORMAL LOW (ref 0.9–3.3)
nRBC: 0 % (ref 0–0)

## 2015-05-16 LAB — MAGNESIUM (CC13): Magnesium: 1.5 mg/dl (ref 1.5–2.5)

## 2015-05-16 LAB — HOLD TUBE, BLOOD BANK

## 2015-05-16 MED ORDER — DEXTROSE 5 % IV SOLN
Freq: Once | INTRAVENOUS | Status: AC
  Administered 2015-05-16: 10:00:00 via INTRAVENOUS

## 2015-05-16 MED ORDER — OXALIPLATIN CHEMO INJECTION 100 MG/20ML
65.0000 mg/m2 | Freq: Once | INTRAVENOUS | Status: AC
  Administered 2015-05-16: 115 mg via INTRAVENOUS
  Filled 2015-05-16: qty 23

## 2015-05-16 MED ORDER — IRINOTECAN HCL CHEMO INJECTION 100 MG/5ML
180.0000 mg/m2 | Freq: Once | INTRAVENOUS | Status: AC
  Administered 2015-05-16: 324 mg via INTRAVENOUS
  Filled 2015-05-16: qty 16.2

## 2015-05-16 MED ORDER — SODIUM CHLORIDE 0.9 % IV SOLN
Freq: Once | INTRAVENOUS | Status: AC
  Administered 2015-05-16: 10:00:00 via INTRAVENOUS
  Filled 2015-05-16: qty 8

## 2015-05-16 MED ORDER — SODIUM CHLORIDE 0.9 % IJ SOLN
10.0000 mL | INTRAMUSCULAR | Status: DC | PRN
  Administered 2015-05-16: 10 mL via INTRAVENOUS
  Filled 2015-05-16: qty 10

## 2015-05-16 MED ORDER — LEUCOVORIN CALCIUM INJECTION 350 MG
400.0000 mg/m2 | Freq: Once | INTRAMUSCULAR | Status: AC
  Administered 2015-05-16: 720 mg via INTRAVENOUS
  Filled 2015-05-16: qty 36

## 2015-05-16 MED ORDER — ATROPINE SULFATE 1 MG/ML IJ SOLN
0.5000 mg | Freq: Once | INTRAMUSCULAR | Status: AC | PRN
  Administered 2015-05-16: 0.5 mg via INTRAVENOUS

## 2015-05-16 MED ORDER — SODIUM CHLORIDE 0.9 % IV SOLN
2400.0000 mg/m2 | INTRAVENOUS | Status: DC
  Administered 2015-05-16: 4300 mg via INTRAVENOUS
  Filled 2015-05-16: qty 86

## 2015-05-16 MED ORDER — ATROPINE SULFATE 1 MG/ML IJ SOLN
INTRAMUSCULAR | Status: AC
  Filled 2015-05-16: qty 1

## 2015-05-16 MED ORDER — POTASSIUM CHLORIDE CRYS ER 10 MEQ PO TBCR: 10.0000 meq | EXTENDED_RELEASE_TABLET | Freq: Two times a day (BID) | ORAL | Status: DC

## 2015-05-16 NOTE — Progress Notes (Signed)
Labs reviewed with Dr. Benay Spice, okay to tx. Oxaliplatin to be dose-reduced and to run over 2 hrs. Barnetta Chapel, Infusion RN made aware.

## 2015-05-16 NOTE — Patient Instructions (Signed)
[facility] Discharge Instructions for Patients Receiving Chemotherapy  Today you received the following chemotherapy agents Oxaliplatin/Irinotecan/Leucovorin/5 FU To help prevent nausea and vomiting after your treatment, we encourage you to take your nausea medication as prescribed.  If you develop nausea and vomiting that is not controlled by your nausea medication, call the clinic.   BELOW ARE SYMPTOMS THAT SHOULD BE REPORTED IMMEDIATELY:  *FEVER GREATER THAN 100.5 F  *CHILLS WITH OR WITHOUT FEVER  NAUSEA AND VOMITING THAT IS NOT CONTROLLED WITH YOUR NAUSEA MEDICATION  *UNUSUAL SHORTNESS OF BREATH  *UNUSUAL BRUISING OR BLEEDING  TENDERNESS IN MOUTH AND THROAT WITH OR WITHOUT PRESENCE OF ULCERS  *URINARY PROBLEMS  *BOWEL PROBLEMS  UNUSUAL RASH Items with * indicate a potential emergency and should be followed up as soon as possible.  Feel free to call the clinic you have any questions or concerns. The clinic phone number is [phone].  Please show the CHEMO ALERT CARD at check-in to the Emergency Department and triage nurse.   

## 2015-05-16 NOTE — Patient Instructions (Signed)

## 2015-05-16 NOTE — Progress Notes (Signed)
[place] OFFICE PROGRESS NOTE   Diagnosis: Pancreas cancer  INTERVAL HISTORY:   Sonya Larson returns as scheduled. She competed cycle 4 of FOLFIRINOX on 05/02/2015. She developed "leg twitching "and mild dysarthria at the completion of the oxaliplatin infusion. She continues to have cold sensitivity. No other neuropathy symptoms. Good appetite and energy level. She has approximately 3 bowel movements per day, some are loose.  Objective:  Vital signs in last 24 hours:  Blood pressure 149/91, pulse 91, temperature 98.4 F (36.9 C), temperature source Oral, resp. rate 18, height 5\' 3"  (1.6 m), weight 150 lb 8 oz (68.266 kg), last menstrual period 10/31/2005, SpO2 100 %.    HEENT: No thrush or ulcers Resp: Lungs clear bilaterally Cardio: Regular rate and rhythm GI: No hepatosplenomegaly, nontender Vascular: No leg edema Neuro: The vibratory sense is intact at the fingertips bilaterally  Skin: Mild hyperpigmentation of the palms   Portacath/PICC-without erythema  Lab Results:  Lab Results  Component Value Date   WBC 6.8 05/16/2015   HGB 8.5* 05/16/2015   HCT 26.6* 05/16/2015   MCV 95.7 05/16/2015   PLT 88* 05/16/2015   NEUTROABS 5.6 05/16/2015    Potassium 2.8   Imaging:  Dg Chest 2 View  05/16/2015   CLINICAL DATA:  Followup of right pleural effusion, history of adenocarcinoma of the head of the pancreas  EXAM: CHEST  2 VIEW  COMPARISON:  Chest x-ray of 04/18/2015  FINDINGS: There has been decrease in volume of the small left pleural effusion with mild left basilar atelectasis remaining. The right lung is clear. Mediastinal and hilar contours are unremarkable. Left Port-A-Cath is present with the tip seen to the mid SVC. The heart is within normal limits in size. There are mild degenerative changes in the mid to lower thoracic spine.  IMPRESSION: Decrease in volume of small left pleural effusion.   Electronically Signed   By: Ivar Drape M.D.   On:  05/16/2015 08:38    Medications: I have reviewed the patient's current medications.  Assessment/Plan: 1. Adenocarcinoma the pancreas, uncinate mass, EUS April 2015 revealed a uT3,uN1 lesion with an FNA biopsy confirming adenocarcinoma  MRI abdomen 07/04/2014 confirmed a pancreas uncinate 8 mass, 11 mm portacaval lymph node, no vascular involvement, and no evidence of distant metastatic disease   PET scan 07/20/2012 with a hypermetabolic uncinate process mass and hypermetabolic periportal lymph node   Markedly elevated CA 19-9   Negative diagnostic laparoscopy 07/24/2014   Initiation of Xeloda/radiation 08/10/2014.completed 09/21/2014.  CT 10/03/2014 confirmed a decrease in the pancreas mass, stable portacaval lymph node, and apparent involvement of the superior mesenteric artery  Pancreaticoduodenectomy on 11/02/2014 confirmed a poorly differentiated adenocarcinoma, ypT2,ypN0  Initiation of adjuvant gemcitabine 12/27/2014  Rising CA 19-07 February 2015  Left thoracentesis 03/08/2015 confirmed metastatic adenocarcinoma  CTs 03/16/2015 with a left pleural effusion and new right lung nodule  Cycle 1 FOLFIRINOX 03/21/2015  Cycle 2 FOLFIRINOX 04/04/2015  Cycle 3 FOLFIRINOX 04/18/2015  Cycle 4 FOLFIRINOX 05/02/2015  Cycle 5 FOLFIRINOX 05/16/2015 (oxaliplatin dose reduced secondary to thrombocytopenia)  2. Hypertension 3. Hyperlipidemia  4. Vaginal spotting summer 2015-evaluated by gynecology  5. Zoster rash at June 2015  6. Asthma  7. Family history of pancreas cancer 8. Nausea following the pancreaticoduodenectomy procedure. Improved. 9. Anemia secondary to chemotherapy 10. Admission with a fever 02/09/2015-no source for infection identified, treated for presumptive pneumonia 11. Left pleural effusion. Slightly larger on follow-up chest x-ray 03/06/2015. Status post a diagnostic/therapeutic thoracentesis 03/08/2015 cytology confirmed  metastatic  adenocarcinoma 12. Dysarthria/tongue numbness at the completion of oxaliplatin with cycle 1 FOLFIRINOX 03/21/2015-spontaneously resolved over hours, similar symptoms including diplopia following subsequent cycles of FOLFIRINOX 13. Hypokalemia-likely secondary to diarrhea, we will check a magnesium level and replete the potassiu   Disposition:  Sonya Larson is tolerating the FOLFIRINOX well. The left pleural effusion is smaller and the CA-19-9 is lower. She has a good performance status. I reviewed the chest x-ray with Sonya Larson and her husband.  We will replete the potassium. She has mild thrombocytopenia second to chemotherapy. We decided to proceed with chemotherapy today. The oxaliplatin will be dose reduced . She knows to contact us for spontaneous bleeding or bruising.  Betsy Coder, MD  05/16/2015  9:29 AM

## 2015-05-18 ENCOUNTER — Ambulatory Visit (HOSPITAL_BASED_OUTPATIENT_CLINIC_OR_DEPARTMENT_OTHER): Payer: Medicare Other

## 2015-05-18 VITALS — BP 126/82 | HR 80 | Temp 98.2°F

## 2015-05-18 DIAGNOSIS — C257 Malignant neoplasm of other parts of pancreas: Secondary | ICD-10-CM

## 2015-05-18 DIAGNOSIS — C250 Malignant neoplasm of head of pancreas: Secondary | ICD-10-CM

## 2015-05-18 MED ORDER — SODIUM CHLORIDE 0.9 % IJ SOLN
10.0000 mL | INTRAMUSCULAR | Status: DC | PRN
  Administered 2015-05-18: 10 mL
  Filled 2015-05-18: qty 10

## 2015-05-18 MED ORDER — PEGFILGRASTIM INJECTION 6 MG/0.6ML ~~LOC~~
6.0000 mg | PREFILLED_SYRINGE | Freq: Once | SUBCUTANEOUS | Status: AC
  Administered 2015-05-18: 6 mg via SUBCUTANEOUS
  Filled 2015-05-18: qty 0.6

## 2015-05-18 MED ORDER — HEPARIN SOD (PORK) LOCK FLUSH 100 UNIT/ML IV SOLN
500.0000 [IU] | Freq: Once | INTRAVENOUS | Status: AC | PRN
  Administered 2015-05-18: 500 [IU]
  Filled 2015-05-18: qty 5

## 2015-05-27 ENCOUNTER — Other Ambulatory Visit: Payer: Self-pay | Admitting: Oncology

## 2015-05-30 ENCOUNTER — Ambulatory Visit (HOSPITAL_BASED_OUTPATIENT_CLINIC_OR_DEPARTMENT_OTHER): Payer: Medicare Other | Admitting: Oncology

## 2015-05-30 ENCOUNTER — Other Ambulatory Visit (HOSPITAL_BASED_OUTPATIENT_CLINIC_OR_DEPARTMENT_OTHER): Payer: Medicare Other

## 2015-05-30 ENCOUNTER — Telehealth: Payer: Self-pay | Admitting: Oncology

## 2015-05-30 ENCOUNTER — Telehealth: Payer: Self-pay | Admitting: *Deleted

## 2015-05-30 ENCOUNTER — Ambulatory Visit: Payer: Medicare Other

## 2015-05-30 ENCOUNTER — Ambulatory Visit (HOSPITAL_BASED_OUTPATIENT_CLINIC_OR_DEPARTMENT_OTHER): Payer: Medicare Other

## 2015-05-30 VITALS — BP 145/79 | HR 86 | Temp 98.5°F | Resp 18 | Ht 63.0 in | Wt 148.6 lb

## 2015-05-30 DIAGNOSIS — R197 Diarrhea, unspecified: Secondary | ICD-10-CM

## 2015-05-30 DIAGNOSIS — Z95828 Presence of other vascular implants and grafts: Secondary | ICD-10-CM

## 2015-05-30 DIAGNOSIS — D701 Agranulocytosis secondary to cancer chemotherapy: Secondary | ICD-10-CM | POA: Diagnosis not present

## 2015-05-30 DIAGNOSIS — C257 Malignant neoplasm of other parts of pancreas: Secondary | ICD-10-CM

## 2015-05-30 DIAGNOSIS — C250 Malignant neoplasm of head of pancreas: Secondary | ICD-10-CM

## 2015-05-30 LAB — COMPREHENSIVE METABOLIC PANEL (CC13)
ALT: 44 U/L (ref 0–55)
ANION GAP: 8 meq/L (ref 3–11)
AST: 68 U/L — ABNORMAL HIGH (ref 5–34)
Albumin: 2.2 g/dL — ABNORMAL LOW (ref 3.5–5.0)
Alkaline Phosphatase: 291 U/L — ABNORMAL HIGH (ref 40–150)
BUN: 8.9 mg/dL (ref 7.0–26.0)
CO2: 21 mEq/L — ABNORMAL LOW (ref 22–29)
CREATININE: 0.7 mg/dL (ref 0.6–1.1)
Calcium: 7.9 mg/dL — ABNORMAL LOW (ref 8.4–10.4)
Chloride: 115 mEq/L — ABNORMAL HIGH (ref 98–109)
EGFR: >90 mL/min/{1.73_m2} (ref >90)
GLUCOSE: 132 mg/dL (ref 70–140)
POTASSIUM: 3.2 meq/L — AB (ref 3.5–5.1)
Sodium: 143 mEq/L (ref 136–145)
Total Bilirubin: 0.43 mg/dL (ref 0.20–1.20)
Total Protein: 4.9 g/dL — ABNORMAL LOW (ref 6.4–8.3)

## 2015-05-30 LAB — CBC WITH DIFFERENTIAL/PLATELET
BASO%: 0.1 % (ref 0.0–2.0)
Basophils Absolute: 0 10*3/uL (ref 0.0–0.1)
EOS ABS: 0 10*3/uL (ref 0.0–0.5)
EOS%: 0.4 % (ref 0.0–7.0)
HCT: 26.9 % — ABNORMAL LOW (ref 34.8–46.6)
HGB: 8.3 g/dL — ABNORMAL LOW (ref 11.6–15.9)
LYMPH%: 5.4 % — AB (ref 14.0–49.7)
MCH: 31.6 pg (ref 25.1–34.0)
MCHC: 30.9 g/dL — ABNORMAL LOW (ref 31.5–36.0)
MCV: 102.3 fL — ABNORMAL HIGH (ref 79.5–101.0)
MONO#: 0.7 10*3/uL (ref 0.1–0.9)
MONO%: 7.1 % (ref 0.0–14.0)
NEUT%: 87 % — ABNORMAL HIGH (ref 38.4–76.8)
NEUTROS ABS: 8.6 10*3/uL — AB (ref 1.5–6.5)
PLATELETS: 62 10*3/uL — AB (ref 145–400)
RBC: 2.63 10*6/uL — AB (ref 3.70–5.45)
RDW: 21.6 % — ABNORMAL HIGH (ref 11.2–14.5)
WBC: 9.9 10*3/uL (ref 3.9–10.3)
lymph#: 0.5 10*3/uL — ABNORMAL LOW (ref 0.9–3.3)

## 2015-05-30 LAB — HOLD TUBE, BLOOD BANK

## 2015-05-30 MED ORDER — SODIUM CHLORIDE 0.9 % IJ SOLN
10.0000 mL | INTRAMUSCULAR | Status: DC | PRN
  Administered 2015-05-30: 10 mL via INTRAVENOUS
  Filled 2015-05-30: qty 10

## 2015-05-30 MED ORDER — HEPARIN SOD (PORK) LOCK FLUSH 100 UNIT/ML IV SOLN
500.0000 [IU] | Freq: Once | INTRAVENOUS | Status: AC
  Administered 2015-05-30: 500 [IU] via INTRAVENOUS
  Filled 2015-05-30: qty 5

## 2015-05-30 NOTE — Progress Notes (Signed)
  [place] OFFICE PROGRESS NOTE   Diagnosis: Pancreas cancer  INTERVAL HISTORY:   Ms. Sonya Larson returns as scheduled. She completed another cycle FOLFIRINOX 05/16/2015. The oxaliplatin was dose reduced. She noted less cold sensitivity following this cycle of chemotherapy. She also had less acute neurotoxicity at the completion of the oxaliplatin infusion. She complains of increased diarrhea. She has approximately 3 bowel movements each morning and one in the evening. The diarrhea has not improved with Imodium. She is taking potassium. She reports upper airway "congestion".  Objective:  Vital signs in last 24 hours:  Blood pressure 145/79, pulse 86, temperature 98.5 F (36.9 C), temperature source Oral, resp. rate 18, height 5\' 3"  (1.6 m), weight 148 lb 9.6 oz (67.405 kg), last menstrual period 10/31/2005, SpO2 100 %.    HEENT: No thrush or ulcers Resp: Lungs clear bilaterally, no respiratory distress Cardio: Regular rate and rhythm GI: No hepatomegaly Vascular: No leg edema Neuro: Mild decrease in vibratory sense at the fingertips bilaterally    Portacath/PICC-without erythema  Lab Results:  Lab Results  Component Value Date   WBC 9.9 05/30/2015   HGB 8.3* 05/30/2015   HCT 26.9* 05/30/2015   MCV 102.3* 05/30/2015   PLT 62* 05/30/2015   NEUTROABS 8.6* 05/30/2015     Medications: I have reviewed the patient's current medications.  Assessment/Plan: 1. Adenocarcinoma the pancreas, uncinate mass, EUS April 2015 revealed a uT3,uN1 lesion with an FNA biopsy confirming adenocarcinoma  MRI abdomen 07/04/2014 confirmed a pancreas uncinate 8 mass, 11 mm portacaval lymph node, no vascular involvement, and no evidence of distant metastatic disease   PET scan 07/20/2012 with a hypermetabolic uncinate process mass and hypermetabolic periportal lymph node   Markedly elevated CA 19-9   Negative diagnostic laparoscopy 07/24/2014   Initiation of Xeloda/radiation  08/10/2014.completed 09/21/2014.  CT 10/03/2014 confirmed a decrease in the pancreas mass, stable portacaval lymph node, and apparent involvement of the superior mesenteric artery  Pancreaticoduodenectomy on 11/02/2014 confirmed a poorly differentiated adenocarcinoma, ypT2,ypN0  Initiation of adjuvant gemcitabine 12/27/2014  Rising CA 19-07 February 2015  Left thoracentesis 03/08/2015 confirmed metastatic adenocarcinoma  CTs 03/16/2015 with a left pleural effusion and new right lung nodule  Cycle 1 FOLFIRINOX 03/21/2015  Cycle 2 FOLFIRINOX 04/04/2015  Cycle 3 FOLFIRINOX 04/18/2015  Cycle 4 FOLFIRINOX 05/02/2015  Cycle 5 FOLFIRINOX 05/16/2015 (oxaliplatin dose reduced secondary to thrombocytopenia) 2. Hypertension 3. Hyperlipidemia  4. Vaginal spotting summer 2015-evaluated by gynecology  5. Zoster rash at June 2015  6. Asthma  7. Family history of pancreas cancer 8. Nausea following the pancreaticoduodenectomy procedure. Improved. 9. Anemia secondary to chemotherapy 10. Admission with a fever 02/09/2015-no source for infection identified, treated for presumptive pneumonia 11. Left pleural effusion. Slightly larger on follow-up chest x-ray 03/06/2015. Status post a diagnostic/therapeutic thoracentesis 03/08/2015 cytology confirmed metastatic adenocarcinoma 12. Dysarthria/tongue numbness at the completion of oxaliplatin with cycle 1 FOLFIRINOX 03/21/2015-spontaneously resolved over hours, similar symptoms including diplopia following subsequent cycles of FOLFIRINOX 13. Hypokalemia-likely secondary to diarrhea, she is maintained on a potassium supplement 14. Thrombocytopenia secondary to chemotherapy-chemotherapy will be held today   Disposition:  Sonya Larson maintains an excellent performance status. She will increase the use of Imodium for diarrhea and continue the potassium supplement. Chemotherapy will be held today secondary to diarrhea and thrombocytopenia. She will be  scheduled for cycle 6 FOLFIRINOX on 06/06/2015. She will return for an office visit and chemotherapy on 06/20/2015.  Betsy Coder, MD  05/30/2015  9:58 AM

## 2015-05-30 NOTE — Addendum Note (Signed)
Addended by: Domenic Schwab on: 05/30/2015 10:10 AM   Modules accepted: Orders, SmartSet

## 2015-05-30 NOTE — Patient Instructions (Signed)

## 2015-05-30 NOTE — Telephone Encounter (Signed)
Pt confirmed labs/ov per 06/29 POF, gave pt AVS and Calendar... KJ, sent msg to r/s chemo from today to next Wed

## 2015-05-30 NOTE — Telephone Encounter (Signed)
Per staff message and POF I have scheduled appts. Advised scheduler of appts. JMW  

## 2015-05-31 LAB — CANCER ANTIGEN 19-9: CA 19-9: 164 U/mL — ABNORMAL HIGH (ref <35.0)

## 2015-06-01 ENCOUNTER — Ambulatory Visit: Payer: Medicare Other

## 2015-06-06 ENCOUNTER — Ambulatory Visit (HOSPITAL_BASED_OUTPATIENT_CLINIC_OR_DEPARTMENT_OTHER): Payer: Medicare Other

## 2015-06-06 ENCOUNTER — Other Ambulatory Visit (HOSPITAL_BASED_OUTPATIENT_CLINIC_OR_DEPARTMENT_OTHER): Payer: Medicare Other

## 2015-06-06 ENCOUNTER — Ambulatory Visit: Payer: Medicare Other

## 2015-06-06 VITALS — BP 150/86 | HR 83 | Temp 98.2°F

## 2015-06-06 DIAGNOSIS — Z5111 Encounter for antineoplastic chemotherapy: Secondary | ICD-10-CM | POA: Diagnosis not present

## 2015-06-06 DIAGNOSIS — C257 Malignant neoplasm of other parts of pancreas: Secondary | ICD-10-CM | POA: Diagnosis not present

## 2015-06-06 DIAGNOSIS — C250 Malignant neoplasm of head of pancreas: Secondary | ICD-10-CM

## 2015-06-06 DIAGNOSIS — Z95828 Presence of other vascular implants and grafts: Secondary | ICD-10-CM

## 2015-06-06 LAB — CBC WITH DIFFERENTIAL/PLATELET
BASO%: 1.1 % (ref 0.0–2.0)
BASOS ABS: 0.1 10*3/uL (ref 0.0–0.1)
EOS%: 0.9 % (ref 0.0–7.0)
Eosinophils Absolute: 0.1 10*3/uL (ref 0.0–0.5)
HCT: 27 % — ABNORMAL LOW (ref 34.8–46.6)
HEMOGLOBIN: 8.7 g/dL — AB (ref 11.6–15.9)
LYMPH#: 0.4 10*3/uL — AB (ref 0.9–3.3)
LYMPH%: 7.7 % — ABNORMAL LOW (ref 14.0–49.7)
MCH: 32.6 pg (ref 25.1–34.0)
MCHC: 32.2 g/dL (ref 31.5–36.0)
MCV: 101.4 fL — ABNORMAL HIGH (ref 79.5–101.0)
MONO#: 0.6 10*3/uL (ref 0.1–0.9)
MONO%: 10.6 % (ref 0.0–14.0)
NEUT%: 79.7 % — ABNORMAL HIGH (ref 38.4–76.8)
NEUTROS ABS: 4.6 10*3/uL (ref 1.5–6.5)
Platelets: 135 10*3/uL — ABNORMAL LOW (ref 145–400)
RBC: 2.66 10*6/uL — ABNORMAL LOW (ref 3.70–5.45)
RDW: 21.9 % — AB (ref 11.2–14.5)
WBC: 5.7 10*3/uL (ref 3.9–10.3)

## 2015-06-06 LAB — COMPREHENSIVE METABOLIC PANEL (CC13)
ALBUMIN: 2.1 g/dL — AB (ref 3.5–5.0)
ALT: 43 U/L (ref 0–55)
ANION GAP: 6 meq/L (ref 3–11)
AST: 84 U/L — ABNORMAL HIGH (ref 5–34)
Alkaline Phosphatase: 281 U/L — ABNORMAL HIGH (ref 40–150)
BILIRUBIN TOTAL: 0.55 mg/dL (ref 0.20–1.20)
BUN: 8.1 mg/dL (ref 7.0–26.0)
CALCIUM: 8.1 mg/dL — AB (ref 8.4–10.4)
CHLORIDE: 113 meq/L — AB (ref 98–109)
CO2: 24 meq/L (ref 22–29)
Creatinine: 0.7 mg/dL (ref 0.6–1.1)
GLUCOSE: 116 mg/dL (ref 70–140)
Potassium: 3.9 mEq/L (ref 3.5–5.1)
SODIUM: 143 meq/L (ref 136–145)
TOTAL PROTEIN: 5.2 g/dL — AB (ref 6.4–8.3)

## 2015-06-06 MED ORDER — DEXTROSE 5 % IV SOLN
250.0000 mL | Freq: Once | INTRAVENOUS | Status: AC
  Administered 2015-06-06: 250 mL via INTRAVENOUS

## 2015-06-06 MED ORDER — IRINOTECAN HCL CHEMO INJECTION 100 MG/5ML
180.0000 mg/m2 | Freq: Once | INTRAVENOUS | Status: AC
  Administered 2015-06-06: 324 mg via INTRAVENOUS
  Filled 2015-06-06: qty 16.2

## 2015-06-06 MED ORDER — ATROPINE SULFATE 1 MG/ML IJ SOLN
INTRAMUSCULAR | Status: AC
  Filled 2015-06-06: qty 1

## 2015-06-06 MED ORDER — ATROPINE SULFATE 1 MG/ML IJ SOLN
0.5000 mg | Freq: Once | INTRAMUSCULAR | Status: AC | PRN
  Administered 2015-06-06: 0.5 mg via INTRAVENOUS

## 2015-06-06 MED ORDER — OXALIPLATIN CHEMO INJECTION 100 MG/20ML
65.0000 mg/m2 | Freq: Once | INTRAVENOUS | Status: AC
  Administered 2015-06-06: 115 mg via INTRAVENOUS
  Filled 2015-06-06: qty 23

## 2015-06-06 MED ORDER — LEUCOVORIN CALCIUM INJECTION 350 MG
400.0000 mg/m2 | Freq: Once | INTRAVENOUS | Status: AC
  Administered 2015-06-06: 720 mg via INTRAVENOUS
  Filled 2015-06-06: qty 36

## 2015-06-06 MED ORDER — SODIUM CHLORIDE 0.9 % IJ SOLN
10.0000 mL | INTRAMUSCULAR | Status: DC | PRN
  Administered 2015-06-06: 10 mL via INTRAVENOUS
  Filled 2015-06-06: qty 10

## 2015-06-06 MED ORDER — SODIUM CHLORIDE 0.9 % IV SOLN
Freq: Once | INTRAVENOUS | Status: AC
  Administered 2015-06-06: 11:00:00 via INTRAVENOUS
  Filled 2015-06-06: qty 8

## 2015-06-06 MED ORDER — SODIUM CHLORIDE 0.9 % IV SOLN
2400.0000 mg/m2 | INTRAVENOUS | Status: DC
  Administered 2015-06-06: 4300 mg via INTRAVENOUS
  Filled 2015-06-06: qty 86

## 2015-06-06 NOTE — Patient Instructions (Addendum)
[place] Discharge Instructions for Patients Receiving Chemotherapy  Today you received the following chemotherapy agents Oxaliplatin/Irinotecan/leucovorin/5 FU To help prevent nausea and vomiting after your treatment, we encourage you to take your nausea medication as prescribed.  If you develop nausea and vomiting that is not controlled by your nausea medication, call the clinic.   BELOW ARE SYMPTOMS THAT SHOULD BE REPORTED IMMEDIATELY:  *FEVER GREATER THAN 100.5 F  *CHILLS WITH OR WITHOUT FEVER  NAUSEA AND VOMITING THAT IS NOT CONTROLLED WITH YOUR NAUSEA MEDICATION  *UNUSUAL SHORTNESS OF BREATH  *UNUSUAL BRUISING OR BLEEDING  TENDERNESS IN MOUTH AND THROAT WITH OR WITHOUT PRESENCE OF ULCERS  *URINARY PROBLEMS  *BOWEL PROBLEMS  UNUSUAL RASH Items with * indicate a potential emergency and should be followed up as soon as possible.  Feel free to call the clinic you have any questions or concerns. The clinic phone number is [phone].  Please show the [place] at check-in to the Emergency Department and triage nurse.

## 2015-06-06 NOTE — Patient Instructions (Signed)

## 2015-06-06 NOTE — Progress Notes (Signed)
Patient complains of being sick x 1 week. Patient states she has had a productive cough x 2 days producing green sputum. Patient denies SOB, chest pain, fever or chills.  Dr. Benay Spice notified. Ok to treat with last weeks CMET results.

## 2015-06-08 ENCOUNTER — Ambulatory Visit (HOSPITAL_BASED_OUTPATIENT_CLINIC_OR_DEPARTMENT_OTHER): Payer: Medicare Other

## 2015-06-08 ENCOUNTER — Ambulatory Visit: Payer: Medicare Other

## 2015-06-08 VITALS — BP 140/82 | HR 81 | Temp 98.5°F | Resp 16

## 2015-06-08 DIAGNOSIS — C257 Malignant neoplasm of other parts of pancreas: Secondary | ICD-10-CM

## 2015-06-08 DIAGNOSIS — C250 Malignant neoplasm of head of pancreas: Secondary | ICD-10-CM

## 2015-06-08 MED ORDER — HEPARIN SOD (PORK) LOCK FLUSH 100 UNIT/ML IV SOLN
500.0000 [IU] | Freq: Once | INTRAVENOUS | Status: AC | PRN
  Administered 2015-06-08: 500 [IU]
  Filled 2015-06-08: qty 5

## 2015-06-08 MED ORDER — PEGFILGRASTIM INJECTION 6 MG/0.6ML ~~LOC~~
6.0000 mg | PREFILLED_SYRINGE | Freq: Once | SUBCUTANEOUS | Status: AC
  Administered 2015-06-08: 6 mg via SUBCUTANEOUS
  Filled 2015-06-08: qty 0.6

## 2015-06-08 MED ORDER — SODIUM CHLORIDE 0.9 % IJ SOLN
10.0000 mL | INTRAMUSCULAR | Status: DC | PRN
  Administered 2015-06-08: 10 mL
  Filled 2015-06-08: qty 10

## 2015-06-08 NOTE — Patient Instructions (Signed)
Pegfilgrastim injection What is this medicine? PEGFILGRASTIM (peg fil GRA stim) is a long-acting granulocyte colony-stimulating factor that stimulates the growth of neutrophils, a type of white blood cell important in the body's fight against infection. It is used to reduce the incidence of fever and infection in patients with certain types of cancer who are receiving chemotherapy that affects the bone marrow. This medicine may be used for other purposes; ask your health care provider or pharmacist if you have questions. COMMON BRAND NAME(S): Neulasta What should I tell my health care provider before I take this medicine? They need to know if you have any of these conditions: -latex allergy -ongoing radiation therapy -sickle cell disease -skin reactions to acrylic adhesives (On-Body Injector only) -an unusual or allergic reaction to pegfilgrastim, filgrastim, other medicines, foods, dyes, or preservatives -pregnant or trying to get pregnant -breast-feeding How should I use this medicine? This medicine is for injection under the skin. If you get this medicine at home, you will be taught how to prepare and give the pre-filled syringe or how to use the On-body Injector. Refer to the patient Instructions for Use for detailed instructions. Use exactly as directed. Take your medicine at regular intervals. Do not take your medicine more often than directed. It is important that you put your used needles and syringes in a special sharps container. Do not put them in a trash can. If you do not have a sharps container, call your pharmacist or healthcare provider to get one. Talk to your pediatrician regarding the use of this medicine in children. Special care may be needed. Overdosage: If you think you have taken too much of this medicine contact a poison control center or emergency room at once. NOTE: This medicine is only for you. Do not share this medicine with others. What if I miss a dose? It is  important not to miss your dose. Call your doctor or health care professional if you miss your dose. If you miss a dose due to an On-body Injector failure or leakage, a new dose should be administered as soon as possible using a single prefilled syringe for manual use. What may interact with this medicine? Interactions have not been studied. Give your health care provider a list of all the medicines, herbs, non-prescription drugs, or dietary supplements you use. Also tell them if you smoke, drink alcohol, or use illegal drugs. Some items may interact with your medicine. This list may not describe all possible interactions. Give your health care provider a list of all the medicines, herbs, non-prescription drugs, or dietary supplements you use. Also tell them if you smoke, drink alcohol, or use illegal drugs. Some items may interact with your medicine. What should I watch for while using this medicine? You may need blood work done while you are taking this medicine. If you are going to need a MRI, CT scan, or other procedure, tell your doctor that you are using this medicine (On-Body Injector only). What side effects may I notice from receiving this medicine? Side effects that you should report to your doctor or health care professional as soon as possible: -allergic reactions like skin rash, itching or hives, swelling of the face, lips, or tongue -dizziness -fever -pain, redness, or irritation at site where injected -pinpoint red spots on the skin -shortness of breath or breathing problems -stomach or side pain, or pain at the shoulder -swelling -tiredness -trouble passing urine Side effects that usually do not require medical attention (report to your doctor   or health care professional if they continue or are bothersome): -bone pain -muscle pain This list may not describe all possible side effects. Call your doctor for medical advice about side effects. You may report side effects to FDA at  1-800-FDA-1088. Where should I keep my medicine? Keep out of the reach of children. Store pre-filled syringes in a refrigerator between 2 and 8 degrees C (36 and 46 degrees F). Do not freeze. Keep in carton to protect from light. Throw away this medicine if it is left out of the refrigerator for more than 48 hours. Throw away any unused medicine after the expiration date. NOTE: This sheet is a summary. It may not cover all possible information. If you have questions about this medicine, talk to your doctor, pharmacist, or health care provider.  2015, Elsevier/Gold Standard. (2014-02-16 16:14:05)  

## 2015-06-12 ENCOUNTER — Other Ambulatory Visit: Payer: Self-pay | Admitting: *Deleted

## 2015-06-12 ENCOUNTER — Ambulatory Visit: Payer: Medicare Other

## 2015-06-12 ENCOUNTER — Observation Stay (HOSPITAL_COMMUNITY)
Admission: EM | Admit: 2015-06-12 | Discharge: 2015-06-13 | Disposition: A | Discharge status: Home / Self Care | Payer: Medicare Other | Attending: Internal Medicine | Admitting: Internal Medicine

## 2015-06-12 ENCOUNTER — Other Ambulatory Visit: Payer: Self-pay | Admitting: Nurse Practitioner

## 2015-06-12 ENCOUNTER — Ambulatory Visit (HOSPITAL_COMMUNITY)
Admission: RE | Admit: 2015-06-12 | Discharge: 2015-06-12 | Disposition: A | Discharge status: Home / Self Care | Payer: Medicare Other | Source: Ambulatory Visit | Attending: Oncology | Admitting: Oncology

## 2015-06-12 ENCOUNTER — Telehealth: Payer: Self-pay | Admitting: Nurse Practitioner

## 2015-06-12 ENCOUNTER — Ambulatory Visit (HOSPITAL_BASED_OUTPATIENT_CLINIC_OR_DEPARTMENT_OTHER): Payer: Medicare Other | Admitting: Nurse Practitioner

## 2015-06-12 ENCOUNTER — Ambulatory Visit (HOSPITAL_BASED_OUTPATIENT_CLINIC_OR_DEPARTMENT_OTHER): Payer: Medicare Other

## 2015-06-12 ENCOUNTER — Other Ambulatory Visit (HOSPITAL_BASED_OUTPATIENT_CLINIC_OR_DEPARTMENT_OTHER): Payer: Medicare Other

## 2015-06-12 ENCOUNTER — Telehealth: Payer: Self-pay | Admitting: *Deleted

## 2015-06-12 ENCOUNTER — Encounter (HOSPITAL_COMMUNITY): Payer: Self-pay | Admitting: *Deleted

## 2015-06-12 VITALS — BP 144/73 | HR 85 | Temp 98.1°F | Resp 16

## 2015-06-12 DIAGNOSIS — K635 Polyp of colon: Secondary | ICD-10-CM | POA: Insufficient documentation

## 2015-06-12 DIAGNOSIS — Z8507 Personal history of malignant neoplasm of pancreas: Secondary | ICD-10-CM | POA: Diagnosis not present

## 2015-06-12 DIAGNOSIS — C257 Malignant neoplasm of other parts of pancreas: Secondary | ICD-10-CM

## 2015-06-12 DIAGNOSIS — Z8619 Personal history of other infectious and parasitic diseases: Secondary | ICD-10-CM | POA: Insufficient documentation

## 2015-06-12 DIAGNOSIS — D696 Thrombocytopenia, unspecified: Secondary | ICD-10-CM

## 2015-06-12 DIAGNOSIS — F419 Anxiety disorder, unspecified: Secondary | ICD-10-CM | POA: Diagnosis not present

## 2015-06-12 DIAGNOSIS — D649 Anemia, unspecified: Secondary | ICD-10-CM

## 2015-06-12 DIAGNOSIS — D61810 Antineoplastic chemotherapy induced pancytopenia: Secondary | ICD-10-CM | POA: Diagnosis present

## 2015-06-12 DIAGNOSIS — I10 Essential (primary) hypertension: Secondary | ICD-10-CM | POA: Diagnosis not present

## 2015-06-12 DIAGNOSIS — C250 Malignant neoplasm of head of pancreas: Secondary | ICD-10-CM | POA: Diagnosis present

## 2015-06-12 DIAGNOSIS — M1990 Unspecified osteoarthritis, unspecified site: Secondary | ICD-10-CM | POA: Insufficient documentation

## 2015-06-12 DIAGNOSIS — Q02 Microcephaly: Secondary | ICD-10-CM | POA: Insufficient documentation

## 2015-06-12 DIAGNOSIS — J45909 Unspecified asthma, uncomplicated: Secondary | ICD-10-CM | POA: Insufficient documentation

## 2015-06-12 DIAGNOSIS — R040 Epistaxis: Principal | ICD-10-CM | POA: Diagnosis present

## 2015-06-12 DIAGNOSIS — Z79899 Other long term (current) drug therapy: Secondary | ICD-10-CM | POA: Insufficient documentation

## 2015-06-12 DIAGNOSIS — F329 Major depressive disorder, single episode, unspecified: Secondary | ICD-10-CM | POA: Insufficient documentation

## 2015-06-12 DIAGNOSIS — T451X5A Adverse effect of antineoplastic and immunosuppressive drugs, initial encounter: Secondary | ICD-10-CM

## 2015-06-12 DIAGNOSIS — K219 Gastro-esophageal reflux disease without esophagitis: Secondary | ICD-10-CM | POA: Insufficient documentation

## 2015-06-12 DIAGNOSIS — E785 Hyperlipidemia, unspecified: Secondary | ICD-10-CM | POA: Diagnosis present

## 2015-06-12 DIAGNOSIS — Z95828 Presence of other vascular implants and grafts: Secondary | ICD-10-CM

## 2015-06-12 LAB — CBC WITH DIFFERENTIAL/PLATELET
BASO%: 0.4 % (ref 0.0–2.0)
BASOS ABS: 0 10*3/uL (ref 0.0–0.1)
Basophils Absolute: 0 10*3/uL (ref 0.0–0.1)
Basophils Relative: 0 % (ref 0–1)
EOS ABS: 0 10*3/uL (ref 0.0–0.7)
EOS%: 0.4 % (ref 0.0–7.0)
Eosinophils Absolute: 0 10*3/uL (ref 0.0–0.5)
Eosinophils Relative: 0 % (ref 0–5)
HCT: 20.8 % — ABNORMAL LOW (ref 36.0–46.0)
HEMATOCRIT: 21.1 % — AB (ref 34.8–46.6)
HEMOGLOBIN: 6.6 g/dL — AB (ref 11.6–15.9)
Hemoglobin: 6.5 g/dL — CL (ref 12.0–15.0)
LYMPH#: 0.4 10*3/uL — AB (ref 0.9–3.3)
LYMPH%: 7.2 % — ABNORMAL LOW (ref 14.0–49.7)
Lymphocytes Relative: 11 % — ABNORMAL LOW (ref 12–46)
Lymphs Abs: 0.3 10*3/uL — ABNORMAL LOW (ref 0.7–4.0)
MCH: 33 pg (ref 25.1–34.0)
MCH: 31.7 pg (ref 26.0–34.0)
MCHC: 31.3 g/dL — AB (ref 31.5–36.0)
MCHC: 31.3 g/dL (ref 30.0–36.0)
MCV: 105.5 fL — AB (ref 79.5–101.0)
MCV: 101.5 fL — AB (ref 78.0–100.0)
MONO#: 0.1 10*3/uL (ref 0.1–0.9)
MONO%: 0.9 % (ref 0.0–14.0)
Monocytes Absolute: 0.1 10*3/uL (ref 0.1–1.0)
Monocytes Relative: 2 % — ABNORMAL LOW (ref 3–12)
NEUT#: 4.9 10*3/uL (ref 1.5–6.5)
NEUT%: 91.1 % — ABNORMAL HIGH (ref 38.4–76.8)
NEUTROS ABS: 2.1 10*3/uL (ref 1.7–7.7)
Neutrophils Relative %: 87 % — ABNORMAL HIGH (ref 43–77)
Platelets: 12 10*3/uL — ABNORMAL LOW (ref 145–400)
Platelets: 12 10*3/uL — CL (ref 150–400)
RBC: 2 10*6/uL — ABNORMAL LOW (ref 3.70–5.45)
RBC: 2.05 MIL/uL — ABNORMAL LOW (ref 3.87–5.11)
RDW: 18.5 % — ABNORMAL HIGH (ref 11.2–14.5)
RDW: 19.4 % — ABNORMAL HIGH (ref 11.5–15.5)
WBC: 5.4 10*3/uL (ref 3.9–10.3)
WBC: 2.5 10*3/uL — ABNORMAL LOW (ref 4.0–10.5)

## 2015-06-12 LAB — PREPARE RBC (CROSSMATCH)

## 2015-06-12 LAB — COMPREHENSIVE METABOLIC PANEL (CC13)
ALT: 50 U/L (ref 0–55)
ANION GAP: 4 meq/L (ref 3–11)
AST: 80 U/L — ABNORMAL HIGH (ref 5–34)
Albumin: 1.9 g/dL — ABNORMAL LOW (ref 3.5–5.0)
Alkaline Phosphatase: 322 U/L — ABNORMAL HIGH (ref 40–150)
BILIRUBIN TOTAL: 1.11 mg/dL (ref 0.20–1.20)
BUN: 8.8 mg/dL (ref 7.0–26.0)
CHLORIDE: 113 meq/L — AB (ref 98–109)
CO2: 23 mEq/L (ref 22–29)
Calcium: 7.9 mg/dL — ABNORMAL LOW (ref 8.4–10.4)
Creatinine: 0.6 mg/dL (ref 0.6–1.1)
EGFR: >90 mL/min/{1.73_m2} (ref >90)
Glucose: 82 mg/dl (ref 70–140)
POTASSIUM: 3.5 meq/L (ref 3.5–5.1)
SODIUM: 140 meq/L (ref 136–145)
TOTAL PROTEIN: 5.4 g/dL — AB (ref 6.4–8.3)

## 2015-06-12 LAB — HOLD TUBE, BLOOD BANK

## 2015-06-12 LAB — PROTIME-INR
INR: 1.24 (ref 0.00–1.49)
PROTHROMBIN TIME: 15.8 s — AB (ref 11.6–15.2)

## 2015-06-12 MED ORDER — LIDOCAINE-PRILOCAINE 2.5-2.5 % EX CREA: 1.0000 "application " | TOPICAL_CREAM | CUTANEOUS | Status: DC | PRN

## 2015-06-12 MED ORDER — LORAZEPAM 0.5 MG PO TABS: 0.5000 mg | ORAL_TABLET | Freq: Four times a day (QID) | ORAL | Status: DC | PRN

## 2015-06-12 MED ORDER — PROMETHAZINE HCL 12.5 MG PO TABS
6.2500 mg | ORAL_TABLET | Freq: Four times a day (QID) | ORAL | Status: DC | PRN
  Filled 2015-06-12: qty 1

## 2015-06-12 MED ORDER — HEPARIN SOD (PORK) LOCK FLUSH 100 UNIT/ML IV SOLN
500.0000 [IU] | Freq: Every day | INTRAVENOUS | Status: AC | PRN
  Administered 2015-06-12: 500 [IU]
  Filled 2015-06-12: qty 5

## 2015-06-12 MED ORDER — SODIUM CHLORIDE 0.9 % IJ SOLN
3.0000 mL | Freq: Two times a day (BID) | INTRAMUSCULAR | Status: DC
  Administered 2015-06-13: 3 mL via INTRAVENOUS

## 2015-06-12 MED ORDER — LOPERAMIDE HCL 2 MG PO CAPS
2.0000 mg | ORAL_CAPSULE | ORAL | Status: DC | PRN
  Administered 2015-06-13: 2 mg via ORAL
  Filled 2015-06-12: qty 1

## 2015-06-12 MED ORDER — SODIUM CHLORIDE 0.9 % IV SOLN
10.0000 mL/h | Freq: Once | INTRAVENOUS | Status: AC
  Administered 2015-06-12: 10 mL/h via INTRAVENOUS

## 2015-06-12 MED ORDER — ACETAMINOPHEN 325 MG PO TABS
650.0000 mg | ORAL_TABLET | Freq: Once | ORAL | Status: AC
Start: 2015-06-12 — End: 2015-06-12
  Administered 2015-06-12: 650 mg via ORAL

## 2015-06-12 MED ORDER — SODIUM CHLORIDE 0.9 % IJ SOLN: 3.0000 mL | INTRAMUSCULAR | Status: DC | PRN

## 2015-06-12 MED ORDER — HEPARIN SOD (PORK) LOCK FLUSH 100 UNIT/ML IV SOLN
500.0000 [IU] | Freq: Every day | INTRAVENOUS | Status: DC | PRN
  Filled 2015-06-12: qty 5

## 2015-06-12 MED ORDER — SODIUM CHLORIDE 0.9 % IJ SOLN
10.0000 mL | INTRAMUSCULAR | Status: AC | PRN
  Administered 2015-06-12: 10 mL
  Filled 2015-06-12: qty 10

## 2015-06-12 MED ORDER — SODIUM CHLORIDE 0.9 % IV SOLN
250.0000 mL | Freq: Once | INTRAVENOUS | Status: AC
  Administered 2015-06-12: 250 mL via INTRAVENOUS

## 2015-06-12 MED ORDER — ACETAMINOPHEN 325 MG PO TABS
ORAL_TABLET | ORAL | Status: AC
  Filled 2015-06-12: qty 2

## 2015-06-12 MED ORDER — ONDANSETRON HCL 4 MG PO TABS: 4.0000 mg | ORAL_TABLET | Freq: Four times a day (QID) | ORAL | Status: DC | PRN

## 2015-06-12 MED ORDER — SODIUM CHLORIDE 0.9 % IV SOLN: 10.0000 mL/h | Freq: Once | INTRAVENOUS | Status: DC

## 2015-06-12 MED ORDER — OXYMETAZOLINE HCL 0.05 % NA SOLN
2.0000 | Freq: Once | NASAL | Status: AC
  Administered 2015-06-12: 2 via NASAL
  Filled 2015-06-12: qty 15

## 2015-06-12 MED ORDER — SODIUM CHLORIDE 0.9 % IV SOLN: 250.0000 mL | INTRAVENOUS | Status: DC | PRN

## 2015-06-12 MED ORDER — ATORVASTATIN CALCIUM 10 MG PO TABS
10.0000 mg | ORAL_TABLET | Freq: Every day | ORAL | Status: DC
  Filled 2015-06-12: qty 1

## 2015-06-12 MED ORDER — MEPERIDINE HCL 25 MG/ML IJ SOLN
12.5000 mg | INTRAMUSCULAR | Status: AC
  Administered 2015-06-12: 12.5 mg via INTRAVENOUS

## 2015-06-12 MED ORDER — ONDANSETRON HCL 4 MG/2ML IJ SOLN: 4.0000 mg | Freq: Four times a day (QID) | INTRAMUSCULAR | Status: DC | PRN

## 2015-06-12 MED ORDER — POTASSIUM CHLORIDE CRYS ER 10 MEQ PO TBCR
10.0000 meq | EXTENDED_RELEASE_TABLET | Freq: Two times a day (BID) | ORAL | Status: DC
  Administered 2015-06-13: 10 meq via ORAL
  Filled 2015-06-12 (×2): qty 1

## 2015-06-12 MED ORDER — SODIUM CHLORIDE 0.9 % IJ SOLN
10.0000 mL | INTRAMUSCULAR | Status: DC | PRN
  Administered 2015-06-12: 10 mL via INTRAVENOUS
  Filled 2015-06-12: qty 10

## 2015-06-12 MED ORDER — MEPERIDINE HCL 25 MG/ML IJ SOLN
INTRAMUSCULAR | Status: AC
  Filled 2015-06-12: qty 1

## 2015-06-12 MED ORDER — IBUPROFEN 200 MG PO TABS: 400.0000 mg | ORAL_TABLET | Freq: Two times a day (BID) | ORAL | Status: DC | PRN

## 2015-06-12 NOTE — Progress Notes (Signed)
Patient transported to ER via w/c

## 2015-06-12 NOTE — Telephone Encounter (Signed)
Opened in error

## 2015-06-12 NOTE — Progress Notes (Addendum)
Sonya Larson   Diagnosis: Pancreas cancer   INTERVAL HISTORY:   Sonya Larson returns prior to scheduled follow-up. She reports a persistent nosebleed since 4 AM this morning. No other bleeding except mild intermittent rectal bleeding related to hemorrhoids/straining. She reports a persistent cough over the past 2 weeks. No shortness of breath or fever. Several days ago she had left-sided abdominal pain. The pain resolved over the course of the day. She continues to have bilateral lower leg and ankle edema.  Objective:  Vital signs in last 24 hours:    HEENT: No thrush or ulcers. No bleeding within the oral cavity. Resp: Lungs clear bilaterally. Cardio: Regular rate and rhythm. GI: Abdomen soft and nontender. No hepatomegaly. No splenomegaly. No mass. Vascular: Trace to 1+pitting edema at the lower leg/ankles. Neuro: Alert and oriented. Follows commands.  Port-A-Cath without erythema.   Lab Results:  Lab Results  Component Value Date   WBC 5.4 06/12/2015   HGB 6.6* 06/12/2015   HCT 21.1* 06/12/2015   MCV 105.5* 06/12/2015   PLT 12* 06/12/2015   NEUTROABS 4.9 06/12/2015    Imaging:  No results found.  Medications: I have reviewed the patient's current medications.  Assessment/Plan: 1. Adenocarcinoma the pancreas, uncinate mass, EUS April 2015 revealed a uT3,uN1 lesion with an FNA biopsy confirming adenocarcinoma  MRI abdomen 07/04/2014 confirmed a pancreas uncinate 8 mass, 11 mm portacaval lymph node, no vascular involvement, and no evidence of distant metastatic disease   PET scan 07/20/2012 with a hypermetabolic uncinate process mass and hypermetabolic periportal lymph node   Markedly elevated CA 19-9   Negative diagnostic laparoscopy 07/24/2014   Initiation of Xeloda/radiation 08/10/2014.completed 09/21/2014.  CT 10/03/2014 confirmed a decrease in the pancreas mass, stable portacaval lymph node, and apparent involvement of  the superior mesenteric artery  Pancreaticoduodenectomy on 11/02/2014 confirmed a poorly differentiated adenocarcinoma, ypT2,ypN0  Initiation of adjuvant gemcitabine 12/27/2014  Rising CA 19-07 February 2015  Left thoracentesis 03/08/2015 confirmed metastatic adenocarcinoma  CTs 03/16/2015 with a left pleural effusion and new right lung nodule  Cycle 1 FOLFIRINOX 03/21/2015  Cycle 2 FOLFIRINOX 04/04/2015  Cycle 3 FOLFIRINOX 04/18/2015  Cycle 4 FOLFIRINOX 05/02/2015  Cycle 5 FOLFIRINOX 05/16/2015 (oxaliplatin dose reduced secondary to thrombocytopenia)  Chemotherapy held 05/30/2015 due to thrombocytopenia and diarrhea.  Cycle 6 FOLFIRINOX 06/06/2015 2. Hypertension 3. Hyperlipidemia  4. Vaginal spotting summer 2015-evaluated by gynecology  5. Zoster rash at June 2015  6. Asthma  7. Family history of pancreas cancer 8. Nausea following the pancreaticoduodenectomy procedure. Improved. 9. Anemia secondary to chemotherapy; progressive anemia 06/12/2015 10. Admission with a fever 02/09/2015-no source for infection identified, treated for presumptive pneumonia 11. Left pleural effusion. Slightly larger on follow-up chest x-ray 03/06/2015. Status post a diagnostic/therapeutic thoracentesis 03/08/2015 cytology confirmed metastatic adenocarcinoma 12. Dysarthria/tongue numbness at the completion of oxaliplatin with cycle 1 FOLFIRINOX 03/21/2015-spontaneously resolved over hours, similar symptoms including diplopia following subsequent cycles of FOLFIRINOX 13. Hypokalemia-likely secondary to diarrhea, she is maintained on a potassium supplement 14. Thrombocytopenia secondary to chemotherapy-chemotherapy held 05/30/2015; marked thrombocytopenia (12,000) 06/12/2015 15. Epistaxis 06/12/2015 secondary to #14   Disposition: Sonya Larson has marked thrombocytopenia and progressive anemia. She is experiencing epistaxis related to the low platelet count. She has been transfused one unit of  platelets with resolution of the nosebleed. She will receive 2 units of blood as well (1 unit today and 1 unit 06/13/2015). We will obtain a follow-up CBC prior to the second unit of blood on 06/13/2015.  She developed rigors following the platelet transfusion. She did not develop fever or other concerning symptoms. She was given Tylenol and IV Demerol and the rigors resolved.  Addendum 5:06 PM-the nosebleed recurred. I contacted the charge nurse at the [facility] emergency department to request evaluation/treatment. She will be transported to the triage area of the emergency department following completion of the blood transfusion. I also requested a follow-up platelet count while she is in the emergency room.  Ned Card ANP/GNP-BC   06/12/2015  4:10 PM  This was a shared visit with Ned Card. Sonya Larson was interviewed and examined. She presented at day 7 following a cycle of FOLFIRINOX with epistaxis and severe thrombocytopenia. She was transfused with platelets and 1 unit of packed red blood cells. The epistaxis persisted. We referred her to the emergency room for help with the epistaxis and for a repeat CBC. She is scheduled for a second unit of blood on 06/13/2015. I recommend a second unit of platelets be given for persistent bleeding or a platelet count of less than 20,000. She had rigors following the platelet transfusion at the [facility] I recommend treatment with Tylenol and Benadryl prior to another platelet transfusion.  Julieanne Manson, M.D.

## 2015-06-12 NOTE — ED Provider Notes (Signed)
CSN: [id number]     Arrival date & time 06/12/15  1804 History   First MD Initiated Contact with Patient 06/12/15 1817     Chief Complaint  Patient presents with  . Epistaxis     (Consider location/radiation/quality/duration/timing/severity/associated sxs/prior Treatment) HPI Comments: Patient presents to the ER from the [facility] for evaluation of nosebleeds. Patient started having nosebleeds yesterday. She had a nosebleed again today, was seen at the cancer center. She has thrombocytopenia, was given a transfusion of platelets. The bleeding temporarily stopped, but then started again, was sent to the ER for further evaluation. Patient denies any trauma. She has not had any chest pain, palpitations, passing out, shortness of breath.  Patient is a 64 y.o. female presenting with nosebleeds.  Epistaxis   Past Medical History  Diagnosis Date  . Chicken pox   . Depression   . Hypertension   . Hyperlipidemia   . Polyp of colon   . Shingles JUNE 2015    RIGHT  SHOULDER  . Anxiety     since diagnosis  . Allergy   . Hx of radiation therapy 08/10/14-09/21/14    pancreas 50.4Gy/66fx  . Asthma     exercise induced, seasonal   . GERD (gastroesophageal reflux disease)     uses TUM  PRN, since chemo-   . Arthritis     mild, hips   . Status post PICC central line placement     10/2014  . Pancreatic cancer 07/12/14    Adenocarcinoma. Chemo/ radiation completed early 9'15-Dr. Sherril follows   Past Surgical History  Procedure Laterality Date  . Breast reduction surgery Bilateral   . Colonscopy   LAST DONE SEPT 2014    X 3  . Hysteroscopy  2005    "heavy menopause"  . Eus N/A 07/12/2014    Procedure: ESOPHAGEAL ENDOSCOPIC ULTRASOUND (EUS) RADIAL;  Surgeon: Arta Silence, MD;  Location: [facility];  Service: Endoscopy;  Laterality: N/A;  . Fine needle aspiration N/A 07/12/2014    Procedure: FINE NEEDLE ASPIRATION (FNA) RADIAL;  Surgeon: Arta Silence, MD;  Location: [facility];   Service: Endoscopy;  Laterality: N/A;  . Laparoscopy N/A 07/24/2014    Procedure: LAPAROSCOPY DIAGNOSTIC;  Surgeon: Stark Klein, MD;  Location: [place];  Service: General;  Laterality: N/A;-"no unusual fingings"  . Eus N/A 10/25/2014    Procedure: ESOPHAGEAL ENDOSCOPIC ULTRASOUND (EUS) RADIAL;  Surgeon: Arta Silence, MD;  Location: [facility];  Service: Endoscopy;  Laterality: N/A;  . Fine needle aspiration N/A 10/25/2014    Procedure: FINE NEEDLE ASPIRATION (FNA) LINEAR;  Surgeon: Arta Silence, MD;  Location: [facility];  Service: Endoscopy;  Laterality: N/A;  . Laparoscopy N/A 11/02/2014    Procedure: LAPAROSCOPY DIAGNOSTIC;  Surgeon: Stark Klein, MD;  Location: [place];  Service: General;  Laterality: N/A;  . Whipple procedure N/A 11/02/2014    Procedure: WHIPPLE PROCEDURE;  Surgeon: Stark Klein, MD;  Location: [place];  Service: General;  Laterality: N/A;  . Portacath placement Left 12/13/2014    Procedure: INSERTION PORT-A-CATH;  Surgeon: Stark Klein, MD;  Location: [facility];  Service: General;  Laterality: Left;   Family History  Problem Relation Age of Onset  . Arthritis Mother   . Hypertension Mother   . Alcohol abuse Father   . Cancer Father     brain tumor  . Cancer Cousin     breast ca,    History  Substance Use Topics  . Smoking status: Never Smoker   . Smokeless  tobacco: Never Used  . Alcohol Use: Yes     Comment: VERY RARE   OB History    No data available     Review of Systems  Constitutional: Positive for fatigue.  HENT: Positive for nosebleeds.   All other systems reviewed and are negative.     Allergies  Review of patient's allergies indicates no known allergies.  Home Medications   Prior to Admission medications   Medication Sig Start Date End Date Taking? Authorizing Provider  calcium carbonate (TUMS - DOSED IN MG ELEMENTAL CALCIUM) 500 MG chewable tablet Chew 1 tablet by mouth daily as needed for indigestion or heartburn  (heartburn).    Yes Historical Provider, MD  ibuprofen (ADVIL,MOTRIN) 200 MG tablet Take 400 mg by mouth 2 (two) times daily as needed for moderate pain (pain).    Yes Historical Provider, MD  lidocaine-prilocaine (EMLA) cream Apply 1 application topically as needed. Apply to port 1-2 hours prior to stick and cover with plastic wrap 12/19/14  Yes Ladell Pier, MD  loperamide (IMODIUM) 2 MG capsule Take 2 mg by mouth as needed for diarrhea or loose stools.   Yes Historical Provider, MD  LORazepam (ATIVAN) 0.5 MG tablet TAKE 1 TABLET BY MOUTH EVERY 6 TO 8 HOURS AS NEEDED SEVERE ANXIETY Patient taking differently: TAKE 0.5 MG BY MOUTH EVERY 6 TO 8 HOURS AS NEEDED SEVERE ANXIETY 01/18/15  Yes Eulas Post, MD  ondansetron (ZOFRAN) 8 MG tablet TAKE 1 TABLET (8 MG TOTAL) BY MOUTH EVERY 8 (EIGHT) HOURS AS NEEDED FOR NAUSEA OR VOMITING. 04/24/15  Yes Ladell Pier, MD  potassium chloride (K-DUR,KLOR-CON) 10 MEQ tablet Take 1 tablet (10 mEq total) by mouth 2 (two) times daily. Take 2 capsules 3 times daily for total of 60 mEq. 05/16/15  Yes Ladell Pier, MD  PRESCRIPTION MEDICATION Chem[facility] at [facility]   Yes Historical Provider, MD  promethazine (PHENERGAN) 12.5 MG tablet Take 1/2 tablet to 1 tablet every 6 hours as needed for nausea Patient taking differently: Take 6.25-12.5 mg by mouth every 6 (six) hours as needed for nausea.  11/27/14  Yes Ladell Pier, MD  atorvastatin (LIPITOR) 10 MG tablet TAKE 1 TABLET BY MOUTH EVERY DAY Patient not taking: Reported on 05/16/2015 09/18/14   Eulas Post, MD  lisinopril (PRINIVIL,ZESTRIL) 40 MG tablet TAKE 1 TABLET BY MOUTH EVERY DAY Patient not taking: Reported on 05/16/2015 09/18/14   Eulas Post, MD   BP 152/88 mmHg  Pulse 93  Temp(Src) 98.3 F (36.8 C) (Oral)  Resp 18  SpO2 98%  LMP 10/31/2005 Physical Exam  Constitutional: She is oriented to person, place, and time. She appears well-developed and well-nourished. No distress.  HENT:   Head: Atraumatic. Microcephalic.  Right Ear: Hearing normal.  Left Ear: Hearing normal.  Nose: Epistaxis (Oozing from multiple sources right anterior septum) is observed.  Mouth/Throat: Oropharynx is clear and moist and mucous membranes are normal.  Eyes: Conjunctivae and EOM are normal. Pupils are equal, round, and reactive to light.  Neck: Normal range of motion. Neck supple.  Cardiovascular: Regular rhythm, S1 normal and S2 normal.  Exam reveals no gallop and no friction rub.   No murmur heard. Pulmonary/Chest: Effort normal and breath sounds normal. No respiratory distress. She exhibits no tenderness.  Abdominal: Soft. Normal appearance and bowel sounds are normal. There is no hepatosplenomegaly. There is no tenderness. There is no rebound, no guarding, no tenderness at McBurney's point and negative Murphy's sign. No hernia.  Musculoskeletal: Normal range of motion.  Neurological: She is alert and oriented to person, place, and time. She has normal strength. No cranial nerve deficit or sensory deficit. Coordination normal. GCS eye subscore is 4. GCS verbal subscore is 5. GCS motor subscore is 6.  Skin: Skin is warm, dry and intact. No rash noted. No cyanosis.  Psychiatric: She has a normal mood and affect. Her speech is normal and behavior is normal. Thought content normal.  Nursing note and vitals reviewed.   ED Course  Procedures (including critical care time) Labs Review Labs Reviewed  CBC WITH DIFFERENTIAL/PLATELET - Abnormal; Notable for the following:    WBC 2.5 (*)    RBC 2.05 (*)    Hemoglobin 6.5 (*)    HCT 20.8 (*)    MCV 101.5 (*)    RDW 19.4 (*)    Platelets 12 (*)    Neutrophils Relative % 87 (*)    Lymphocytes Relative 11 (*)    Monocytes Relative 2 (*)    Lymphs Abs 0.3 (*)    All other components within normal limits  PROTIME-INR - Abnormal; Notable for the following:    Prothrombin Time 15.8 (*)    All other components within normal limits  PREPARE RBC  (CROSSMATCH)  PREPARE PLATELET PHERESIS    Imaging Review No results found.   EKG Interpretation None      MDM   Final diagnoses:  None   epistaxis  Anemia  Thrombocytopenia  Patient presents to the emergency department at the request of her oncologist. Patient was seen earlier today for anemia thrombocytopenia with epistaxis. She was given a unit of platelets and the bleeding improved, but that she started bleeding again. Upon arrival in the ER, she had diffuse oozing from the right side of her nose. Attempts to slow this with Afrin were unsuccessful. I did not find any obvious area that could be cauterized, the area was packed with a rapid Rhino 7.5 cm nasal balloon.  Repeat blood work reveals that her platelets are 12 posttransfusion. There were 12 pre-transfusion. This was discussed with Dr. Lindi Adie, on call for oncology. He recommended transfusing 1 unit of platelets. He recommended recheck of platelets after transfusion, give an additional unit if platelets are less than 20 post transfusion. He also recommends transfusing 1 unit of packed red blood cells.  Patient will be admitted to the hospitalist service for further management.    Orpah Greek, MD 06/12/15 2109

## 2015-06-12 NOTE — Progress Notes (Signed)
1525: Pt c/o chills prior to start of unit PRBC's vital signs stable, unit spiked and started at 1525, stopped at 1526 due to patient reporting worsening chills and rigors after receiving platelets.  Normal saline started and Ned Card, NP notified and at chairside to assess patient.  Meds given per orders, vital signs remain stable, rigors have stopped. Blood restarted at 1550.  Vital signs continue to remain stable, will continue to monitor patient closely.    1610: Dr. Benay Spice at chairside to assess patient.

## 2015-06-12 NOTE — ED Notes (Signed)
Pt reports still feeling blood in her throat, pt continues with clamp to nose and applying pressure.

## 2015-06-12 NOTE — Telephone Encounter (Signed)
Per 07/12 POF added labs for 07/13... Advised charge nurse to advise pt confirming D/T for labs... KJ

## 2015-06-12 NOTE — ED Notes (Signed)
Afrin administered and clamp applied by Dr. Betsey Holiday.

## 2015-06-12 NOTE — ED Notes (Signed)
Pt very uncomfortable, continues to have active bleeding, clamp remains in place, MD aware

## 2015-06-12 NOTE — Telephone Encounter (Signed)
TC from pt's husband stating pt had chemo last Wednesday, 06/06/15 and yesterday she started having a nosebleed that lasted a good portion of the day-off and on. The bleeding started again this morning @ 4am and continues despite pressure.  Instructed to put ice on her neck and continue external pressure on her nose for at least 10 minutes at a time. She has been putting kleenex up her nose and removing. Advised her not to insert anything in her nose and remove as it will dislodge any clot that forms.  Maintian external compression to nose.  She also states she is having diarrhea- 4x today. She has just taken 2 imodium.  C/o some nausea d/t swallowing blood from nosebleed. Denies vomiting. Denies fever. Drinking fluids well.  Spoke with Dr. Benay Spice. He wants her to come in for CBC/CMET this am and he will evaluate results.  TCT pt's husband to let know to bring his wife in for labs this.  Urgent pof for labs sent.

## 2015-06-12 NOTE — ED Notes (Signed)
Hospitalist at bedside 

## 2015-06-12 NOTE — Telephone Encounter (Signed)
Reviewed labs done this am. HGB 6.6, platelets 12K. Notified Dr. Benay Spice of labs @ 12:19 pm

## 2015-06-12 NOTE — Patient Instructions (Signed)

## 2015-06-12 NOTE — ED Notes (Signed)
Pt is coming from the [facility] where she was receiving platelets and 1 bag of blood when her nose began bleeding. Pt has been unable to get her nose to stop bleeding. She still has 1 unit of blood left to give (ready in the blood bank) and her port is still accessed. Alert and oriented.

## 2015-06-12 NOTE — Patient Instructions (Signed)
Blood Transfusion Information WHAT IS A BLOOD TRANSFUSION? A transfusion is the replacement of blood or some of its parts. Blood is made up of multiple cells which provide different functions.  Red blood cells carry oxygen and are used for blood loss replacement.  White blood cells fight against infection.  Platelets control bleeding.  Plasma helps clot blood.  Other blood products are available for specialized needs, such as hemophilia or other clotting disorders. BEFORE THE TRANSFUSION  Who gives blood for transfusions?   You may be able to donate blood to be used at a later date on yourself (autologous donation).  Relatives can be asked to donate blood. This is generally not any safer than if you have received blood from a stranger. The same precautions are taken to ensure safety when a relative's blood is donated.  Healthy volunteers who are fully evaluated to make sure their blood is safe. This is blood bank blood. Transfusion therapy is the safest it has ever been in the practice of medicine. Before blood is taken from a donor, a complete history is taken to make sure that person has no history of diseases nor engages in risky social behavior (examples are intravenous drug use or sexual activity with multiple partners). The donor's travel history is screened to minimize risk of transmitting infections, such as malaria. The donated blood is tested for signs of infectious diseases, such as HIV and hepatitis. The blood is then tested to be sure it is compatible with you in order to minimize the chance of a transfusion reaction. If you or a relative donates blood, this is often done in anticipation of surgery and is not appropriate for emergency situations. It takes many days to process the donated blood. RISKS AND COMPLICATIONS Although transfusion therapy is very safe and saves many lives, the main dangers of transfusion include:   Getting an infectious disease.  Developing a  transfusion reaction. This is an allergic reaction to something in the blood you were given. Every precaution is taken to prevent this. The decision to have a blood transfusion has been considered carefully by your caregiver before blood is given. Blood is not given unless the benefits outweigh the risks. AFTER THE TRANSFUSION  Right after receiving a blood transfusion, you will usually feel much better and more energetic. This is especially true if your red blood cells have gotten low (anemic). The transfusion raises the level of the red blood cells which carry oxygen, and this usually causes an energy increase.  The nurse administering the transfusion will monitor you carefully for complications. HOME CARE INSTRUCTIONS  No special instructions are needed after a transfusion. You may find your energy is better. Speak with your caregiver about any limitations on activity for underlying diseases you may have. SEEK MEDICAL CARE IF:   Your condition is not improving after your transfusion.  You develop redness or irritation at the intravenous (IV) site. SEEK IMMEDIATE MEDICAL CARE IF:  Any of the following symptoms occur over the next 12 hours:  Shaking chills.  You have a temperature by mouth above 102 F (38.9 C), not controlled by medicine.  Chest, back, or muscle pain.  People around you feel you are not acting correctly or are confused.  Shortness of breath or difficulty breathing.  Dizziness and fainting.  You get a rash or develop hives.  You have a decrease in urine output.  Your urine turns a dark color or changes to pink, red, or brown. Any of the following   symptoms occur over the next 10 days:  You have a temperature by mouth above 102 F (38.9 C), not controlled by medicine.  Shortness of breath.  Weakness after normal activity.  The white part of the eye turns yellow (jaundice).  You have a decrease in the amount of urine or are urinating less often.  Your  urine turns a dark color or changes to pink, red, or brown. Document Released: 11/14/2000 Document Revised: 02/09/2012 Document Reviewed: 07/03/2008 ExitCare Patient Information 2015 [employer]. This information is not intended to replace advice given to you by your health care provider. Make sure you discuss any questions you have with your health care provider.  Platelet Transfusion Information This is information about transfusions of platelets. Platelets are tiny cells made by the bone marrow and found in the blood. When a blood vessel is damaged, platelets rush to the damaged area to help form a clot. This begins the healing process. When platelets get very low, your blood may have trouble clotting. This may be from:  Illness.  Blood disorder.  Chemotherapy to treat cancer. Often, lower platelet counts do not cause problems.  Platelets usually last for 7 to 10 days. If they are not used in an injury, they are broken down by the liver or spleen. Symptoms of low platelet count include:  Nosebleeds.  Bleeding gums.  Heavy periods.  Bruising and tiny blood spots in the skin.  Pinpoint spots of bleeding (petechiae).  Larger bruises (purpura).  Bleeding can be more serious if it happens in the brain or bowel. Platelet transfusions are often used to keep the platelet count at an acceptable level. Serious bleeding due to low platelets is uncommon. RISKS AND COMPLICATIONS Severe side effects from platelet transfusions are uncommon. Minor reactions may include:  Itching.  Rashes.  High temperature and shivering. Medications are available to stop transfusion reactions. Let your health care provider know if you develop any of the above problems.  If you are having platelet transfusions frequently, they may get less effective. This is called becoming refractory to platelets. It is uncommon. This can happen from non-immune causes and immune causes. Non-immune causes  include:  High temperatures.  Some medications.  An enlarged spleen. Immune causes happen when your body discovers the platelets are not your own and begins making antibodies against them. The antibodies kill the platelets quickly. Even with platelet transfusions, you may still notice problems with bleeding or bruising. Let your health care providers know about this. Other things can be done to help if this happens.  BEFORE THE PROCEDURE   Your health care provider will check your platelet count regularly.  If the platelet count is too low, it may be necessary to have a platelet transfusion.  This is more important before certain procedures with a risk of bleeding, such as a spinal tap.  Platelet transfusion reduces the risk of bleeding during or after the procedure.  Except in emergencies, giving a transfusion requires a written consent. Before blood is taken from a donor, a complete history is taken to make sure the person has no history of previous diseases, nor engages in risky social behavior. Examples of this are intravenous drug use or sexual activity with multiple partners. This could lead to infected blood or blood products being used. This history is taken in spite of the extensive testing to make sure the blood is safe. All blood products transfused are tested to make sure it is a match for the person getting   the blood. It is also checked for infections. Blood is the safest it has ever been. The risk of getting an infection is very low. PROCEDURE  The platelets are stored in small plastic bags that are kept at a low temperature.  Each bag is called a unit and sometimes two units are given. They are given through an intravenous line by drip infusion over about one-half hour.  Usually blood is collected from multiple people to get enough to transfuse.  Sometimes, the platelets are collected from a single person. This is done using a special machine that separates the platelets  from the blood. The machine is called an apheresis machine. Platelets collected in this way are called apheresed platelets. Apheresed platelets reduce the risk of becoming sensitive to the platelets. This lowers the chances of having a transfusion reaction.  As it only takes a short time to give the platelets, this treatment can be given in an outpatient department. Platelets can also be given before or after other treatments. SEEK IMMEDIATE MEDICAL CARE IF: You have any of the following symptoms over the next 12 hours or several days:  Shaking chills.  Fever with a temperature greater than 102F (38.9C) develops.  Back pain or muscle pain.  People around you feel you are not acting correctly, or you are confused.  Blood in the urine or bowel movements, or bleeding from any place in your body.  Shortness of breath, or difficulty breathing.  Dizziness.  Fainting.  You break out in a rash or develop hives.  Decrease in the amount of urine you are putting out, or the urine turns a dark color or changes to pink, red, or brown.  A severe headache or stiff neck.  Bruising more easily. Document Released: 09/14/2007 Document Revised: 04/03/2014 Document Reviewed: 09/14/2007 ExitCare Patient Information 2015 [employer]. This information is not intended to replace advice given to you by your health care provider. Make sure you discuss any questions you have with your health care provider.  

## 2015-06-12 NOTE — ED Notes (Signed)
Pt signed Blood consent form, amb to BR with 1 assist.

## 2015-06-12 NOTE — H&P (Signed)
[facility] History and Physical  Sonya Larson ZJQ:[id number] DOB: January 18, 1951 DOA: 06/12/2015  Referring physician: Joseph Berkshire, MD PCP: Eulas Post, MD   Chief Complaint: Nose Bleed.  HPI: Sonya Larson is a 64 y.o. female with below past medical history who was referred by her oncologist to the emergency department due to persistent epistaxis during the day and thrombocytopenia with a level of 12. This level despite the fact that the patient received a platelet transfusion earlier during the day. The patient last underwent chemotherapy for adenocarcinoma of the pancreas about a week ago and has a history of chemotherapy-induced pancytopenia. In the ER, she received a right nostril packing with a vasoconstrictor and the bleeding has subsided. She is currently receiving another platelet transfusion. She is currently in NAD. Her husband was at bedside.     Review of Systems:  Constitutional:  No weight loss, night sweats, Fevers, chills, fatigue.  HEENT:  No headaches, Difficulty swallowing,Tooth/dental problems,Sore throat,  No sneezing, itching, ear ache, nasal congestion, post nasal drip,  Cardio-vascular:  No chest pain, Orthopnea, PND, swelling in lower extremities, anasarca, dizziness, palpitations  GI:  No heartburn, indigestion, positive abdominal pain, occasional mild nausea, no vomiting, frequent diarrhea (Takes loperamide for that), change in bowel habits, loss of appetite  Resp:  No shortness of breath with exertion or at rest. No excess mucus, no productive cough, No non-productive cough, No coughing up of blood.No change in color of mucus.No wheezing.No chest wall deformity  Skin:  no rash or lesions.  GU:  no dysuria, positive change in color of urine, no urgency or frequency. No flank pain.  Musculoskeletal:  No joint pain or swelling. No decreased range of motion. No back pain.  Psych:  No change in mood or affect. No depression or anxiety.  No memory loss.   Past Medical History  Diagnosis Date  . Chicken pox   . Depression   . Hypertension   . Hyperlipidemia   . Polyp of colon   . Shingles JUNE 2015    RIGHT  SHOULDER  . Anxiety     since diagnosis  . Allergy   . Hx of radiation therapy 08/10/14-09/21/14    pancreas 50.4Gy/46fx  . Asthma     exercise induced, seasonal   . GERD (gastroesophageal reflux disease)     uses TUM  PRN, since chemo-   . Arthritis     mild, hips   . Status post PICC central line placement     10/2014  . Pancreatic cancer 07/12/14    Adenocarcinoma. Chemo/ radiation completed early 9'15-Dr. Sherril follows   Past Surgical History  Procedure Laterality Date  . Breast reduction surgery Bilateral   . Colonscopy   LAST DONE SEPT 2014    X 3  . Hysteroscopy  2005    "heavy menopause"  . Eus N/A 07/12/2014    Procedure: ESOPHAGEAL ENDOSCOPIC ULTRASOUND (EUS) RADIAL;  Surgeon: Arta Silence, MD;  Location: [facility];  Service: Endoscopy;  Laterality: N/A;  . Fine needle aspiration N/A 07/12/2014    Procedure: FINE NEEDLE ASPIRATION (FNA) RADIAL;  Surgeon: Arta Silence, MD;  Location: [facility];  Service: Endoscopy;  Laterality: N/A;  . Laparoscopy N/A 07/24/2014    Procedure: LAPAROSCOPY DIAGNOSTIC;  Surgeon: Stark Klein, MD;  Location: [facility];  Service: General;  Laterality: N/A;-"no unusual fingings"  . Eus N/A 10/25/2014    Procedure: ESOPHAGEAL ENDOSCOPIC ULTRASOUND (EUS) RADIAL;  Surgeon: Arta Silence, MD;  Location: [facility];  Service: Endoscopy;  Laterality: N/A;  . Fine needle aspiration N/A 10/25/2014    Procedure: FINE NEEDLE ASPIRATION (FNA) LINEAR;  Surgeon: Arta Silence, MD;  Location: [facility];  Service: Endoscopy;  Laterality: N/A;  . Laparoscopy N/A 11/02/2014    Procedure: LAPAROSCOPY DIAGNOSTIC;  Surgeon: Stark Klein, MD;  Location: [facility];  Service: General;  Laterality: N/A;  . Whipple procedure N/A 11/02/2014    Procedure: WHIPPLE PROCEDURE;  Surgeon:  Stark Klein, MD;  Location: [facility];  Service: General;  Laterality: N/A;  . Portacath placement Left 12/13/2014    Procedure: INSERTION PORT-A-CATH;  Surgeon: Stark Klein, MD;  Location: [facility];  Service: General;  Laterality: Left;   Social History:  reports that she has never smoked. She has never used smokeless tobacco. She reports that she drinks alcohol. She reports that she does not use illicit drugs.  No Known Allergies  Family History  Problem Relation Age of Onset  . Arthritis Mother   . Hypertension Mother   . Alcohol abuse Father   . Cancer Father     brain tumor  . Cancer Cousin     breast ca,     Prior to Admission medications   Medication Sig Start Date End Date Taking? Authorizing Provider  calcium carbonate (TUMS - DOSED IN MG ELEMENTAL CALCIUM) 500 MG chewable tablet Chew 1 tablet by mouth daily as needed for indigestion or heartburn (heartburn).    Yes Historical Provider, MD  ibuprofen (ADVIL,MOTRIN) 200 MG tablet Take 400 mg by mouth 2 (two) times daily as needed for moderate pain (pain).    Yes Historical Provider, MD  lidocaine-prilocaine (EMLA) cream Apply 1 application topically as needed. Apply to port 1-2 hours prior to stick and cover with plastic wrap 12/19/14  Yes Ladell Pier, MD  loperamide (IMODIUM) 2 MG capsule Take 2 mg by mouth as needed for diarrhea or loose stools.   Yes Historical Provider, MD  LORazepam (ATIVAN) 0.5 MG tablet TAKE 1 TABLET BY MOUTH EVERY 6 TO 8 HOURS AS NEEDED SEVERE ANXIETY Patient taking differently: TAKE 0.5 MG BY MOUTH EVERY 6 TO 8 HOURS AS NEEDED SEVERE ANXIETY 01/18/15  Yes Eulas Post, MD  ondansetron (ZOFRAN) 8 MG tablet TAKE 1 TABLET (8 MG TOTAL) BY MOUTH EVERY 8 (EIGHT) HOURS AS NEEDED FOR NAUSEA OR VOMITING. 04/24/15  Yes Ladell Pier, MD  potassium chloride (K-DUR,KLOR-CON) 10 MEQ tablet Take 1 tablet (10 mEq total) by mouth 2 (two) times daily. Take 2 capsules 3 times daily for total of 60  mEq. 05/16/15  Yes Ladell Pier, MD  PRESCRIPTION MEDICATION Chemotherapy at [facility]   Yes Historical Provider, MD  promethazine (PHENERGAN) 12.5 MG tablet Take 1/2 tablet to 1 tablet every 6 hours as needed for nausea Patient taking differently: Take 6.25-12.5 mg by mouth every 6 (six) hours as needed for nausea.  11/27/14  Yes Ladell Pier, MD  atorvastatin (LIPITOR) 10 MG tablet TAKE 1 TABLET BY MOUTH EVERY DAY Patient not taking: Reported on 05/16/2015 09/18/14   Eulas Post, MD  lisinopril (PRINIVIL,ZESTRIL) 40 MG tablet TAKE 1 TABLET BY MOUTH EVERY DAY Patient not taking: Reported on 05/16/2015 09/18/14   Eulas Post, MD   Physical Exam: Filed Vitals:   06/12/15 2014 06/12/15 2155 06/12/15 2213 06/12/15 2230  BP: 152/88 154/81 142/77 134/78  Pulse: 93 87 88 84  Temp:  98.2 F (36.8 C) 98.3 F (36.8 C)   TempSrc:   Oral  Resp: 18 21 20 23   SpO2: 98% 98%  97%    Wt Readings from Last 3 Encounters:  05/30/15 67.405 kg (148 lb 9.6 oz)  05/16/15 68.266 kg (150 lb 8 oz)  05/02/15 68.085 kg (150 lb 1.6 oz)    General:  Appears calm and comfortable Eyes: PERRL, normal lids, irises & conjunctiva ENT: grossly normal hearing, packing present on right nostril, no active bleeding from nostrils, lips & tongue are moist. Neck: no LAD, masses or thyromegaly Cardiovascular: RRR, no m/r/g. No LE edema. Telemetry: SR, no arrhythmias  Respiratory: CTA bilaterally, no w/r/r. Normal respiratory effort. Abdomen: soft, mild epigastric tenderness, no guarding or rebound tenderness.  Skin: No petechiae, no rash or induration seen on limited exam. Musculoskeletal: grossly normal tone BUE/BLE Psychiatric: grossly normal mood and affect, speech fluent and appropriate Neurologic: grossly non-focal.          Labs on Admission:  Basic Metabolic Panel:  Recent Labs Lab 06/06/15 0918 06/12/15 1156  NA 143 140  K 3.9 3.5  CO2 24 23  GLUCOSE 116 82  BUN 8.1 8.8  CREATININE 0.7  0.6  CALCIUM 8.1 7.9   Liver Function Tests:  Recent Labs Lab 06/06/15 0918 06/12/15 1156  AST 84 80  ALT 43 50  ALKPHOS 281 322  BILITOT 0.55 1.11  PROT 5.2 5.4  ALBUMIN 2.1. 1.9   No results for input(s): LIPASE, AMYLASE in the last 168 hours. No results for input(s): AMMONIA in the last 168 hours. CBC:  Recent Labs Lab 06/06/15 0917 06/12/15 1157 06/12/15 1905  WBC 5.7 5.4 2.5  NEUTROABS 4.6 4.9 2.1  HGB 8.7 6.6 6.5  HCT 27.0 21.1   MCV 101.4 105.5 101.5  PLT 135 12 12   Cardiac Enzymes: No results for input(s): CKTOTAL, CKMB, CKMBINDEX, TROPONINI in the last 168 hours.  BNP (last 3 results) No results for input(s): BNP in the last 8760 hours.  ProBNP (last 3 results) No results for input(s): PROBNP in the last 8760 hours.  CBG: No results for input(s): GLUCAP in the last 168 hours.  Radiological Exams on Admission: No results found.  EKG: Independently reviewed.          02/10/15 NSR  Borderline T wave abnormality.  Assessment/Plan Principal Problem:   Epistaxis Active Problems:   Essential hypertension, benign   Hyperlipidemia   Adenocarcinoma of uncinate process of pancreas   Antineoplastic chemotherapy induced pancytopenia   Oncology discussed case with Dr. Betsey Holiday and were formally consulted by him. Transfuse blood products per oncology request. They would like to transfuse a unit of PRBC as well. Follow up CBC in AM. Continue regular home medications. Monitor BP.  Code Status: Full code DVT Prophylaxis:SCD's Family CommunicationYaneli, Sonya Larson [phone]  [phone]  Disposition Plan: Home, once cleared by oncology  Time spent: 60 minutes  Reubin Milan [facility] Pager [phone]

## 2015-06-13 ENCOUNTER — Other Ambulatory Visit: Payer: Medicare Other

## 2015-06-13 ENCOUNTER — Other Ambulatory Visit: Payer: Self-pay | Admitting: *Deleted

## 2015-06-13 ENCOUNTER — Telehealth: Payer: Self-pay | Admitting: Oncology

## 2015-06-13 DIAGNOSIS — D61810 Antineoplastic chemotherapy induced pancytopenia: Secondary | ICD-10-CM | POA: Diagnosis not present

## 2015-06-13 DIAGNOSIS — T451X5A Adverse effect of antineoplastic and immunosuppressive drugs, initial encounter: Secondary | ICD-10-CM | POA: Diagnosis not present

## 2015-06-13 DIAGNOSIS — R040 Epistaxis: Secondary | ICD-10-CM

## 2015-06-13 DIAGNOSIS — C250 Malignant neoplasm of head of pancreas: Secondary | ICD-10-CM

## 2015-06-13 LAB — COMPREHENSIVE METABOLIC PANEL
ALT: 39 U/L (ref 14–54)
AST: 65 U/L — AB (ref 15–41)
Albumin: 2.1 g/dL — ABNORMAL LOW (ref 3.5–5.0)
Alkaline Phosphatase: 233 U/L — ABNORMAL HIGH (ref 38–126)
Anion gap: 6 (ref 5–15)
BILIRUBIN TOTAL: 1.7 mg/dL — AB (ref 0.3–1.2)
BUN: 10 mg/dL (ref 6–20)
CHLORIDE: 110 mmol/L (ref 101–111)
CO2: 24 mmol/L (ref 22–32)
CREATININE: 0.48 mg/dL (ref 0.44–1.00)
Calcium: 7.8 mg/dL — ABNORMAL LOW (ref 8.9–10.3)
Glucose, Bld: 81 mg/dL (ref 65–99)
Potassium: 3.6 mmol/L (ref 3.5–5.1)
SODIUM: 140 mmol/L (ref 135–145)
Total Protein: 5.5 g/dL — ABNORMAL LOW (ref 6.5–8.1)

## 2015-06-13 LAB — URINALYSIS, ROUTINE W REFLEX MICROSCOPIC
BILIRUBIN URINE: NEGATIVE
Glucose, UA: NEGATIVE mg/dL
Ketones, ur: NEGATIVE mg/dL
NITRITE: NEGATIVE
PH: 7 (ref 5.0–8.0)
PROTEIN: NEGATIVE mg/dL
Specific Gravity, Urine: 1.021 (ref 1.005–1.030)
Urobilinogen, UA: 1 mg/dL (ref 0.0–1.0)

## 2015-06-13 LAB — PREPARE PLATELET PHERESIS: Unit division: 0

## 2015-06-13 LAB — CBC
HCT: 22.7 % — ABNORMAL LOW (ref 36.0–46.0)
Hemoglobin: 7.5 g/dL — ABNORMAL LOW (ref 12.0–15.0)
MCH: 31.9 pg (ref 26.0–34.0)
MCHC: 33 g/dL (ref 30.0–36.0)
MCV: 96.6 fL (ref 78.0–100.0)
PLATELETS: 25 10*3/uL — AB (ref 150–400)
RBC: 2.35 MIL/uL — ABNORMAL LOW (ref 3.87–5.11)
RDW: 20 % — ABNORMAL HIGH (ref 11.5–15.5)
WBC: 2.3 10*3/uL — ABNORMAL LOW (ref 4.0–10.5)

## 2015-06-13 LAB — URINE MICROSCOPIC-ADD ON

## 2015-06-13 LAB — CANCER ANTIGEN 19-9: CA 19 9: 98.9 U/mL — AB (ref <35.0)

## 2015-06-13 MED ORDER — HEPARIN SOD (PORK) LOCK FLUSH 100 UNIT/ML IV SOLN: 500.0000 [IU] | INTRAVENOUS | Status: DC | PRN

## 2015-06-13 NOTE — Progress Notes (Signed)
Nursing Discharge Summary  Patient ID: Sonya Larson MRN: [id number] DOB/AGE: Feb 28, 1951 64 y.o.  Admit date: 06/12/2015 Discharge date: 06/13/2015  Discharged Condition: good  Disposition: 01-Home or Self Care  Follow-up Information    Schedule an appointment as soon as possible for a visit with Eulas Post, MD.   Specialty:  Family Medicine   Why:  As needed   Contact information:   [place] [address] [phone]       Follow up with Izora Gala, MD On 06/18/2015.   Specialty:  Otolaryngology   Why:  1:40PM to have packing removed from nose   Contact information:   [address] [place] [address] [place] [address] [phone]       Follow up with Betsy Coder, MD On 06/14/2015.   Specialty:  Oncology   Why:  repeat blood counts   Contact information:   [place] [address] [phone]       Prescriptions Given: No prescriptions given. Medications and follow up appointments discussed. Patient and significant other verbalized understanding without further questions.   Means of Discharge: patient to be transported downstairs via wheelchair to go home via private vehicle.   Signed: Buel Ream 06/13/2015, 11:55 AM

## 2015-06-13 NOTE — Discharge Summary (Addendum)
Physician Discharge Summary  Sonya Larson WNI:[id number] DOB: [birth year] DOA: 06/12/2015  PCP: Eulas Post, MD  Admit date: 06/12/2015 Discharge date: 06/13/2015  Recommendations for Outpatient Follow-up:  1.  Rhino Rocket placed on 06/12/2015 2.  Follow-up with Dr. Benay Spice on 7/14 for repeat CBC to check hemoglobin and platelet count. She may need additional blood or platelet transfusions. 3.   follow-up with Dr. Constance Holster on 7/18 at 1:40 PM for [facility] removal and reevaluation   Discharge Diagnoses:  Principal Problem:   Epistaxis Active Problems:   Essential hypertension, benign   Hyperlipidemia   Adenocarcinoma of uncinate process of pancreas   Antineoplastic chemotherapy induced pancytopenia   Discharge Condition:Stable, improved Diet recommendation:Regular  Wt Readings from Last 3 Encounters:  06/13/15 68.9 kg (151 lb 14.4 oz)  05/30/15 67.405 kg (148 lb 9.6 oz)  05/16/15 68.266 kg (150 lb 8 oz)    History of present illness:   Sonya Larson is a 64 y.o. female with below past medical history who was referred by her oncologist to the emergency department due to persistent epistaxis during the day and thrombocytopenia with a level of 12. This level despite the fact that the patient received a platelet transfusion earlier during the day. The patient last underwent chemotherapy for adenocarcinoma of the pancreas about a week ago and has a history of chemotherapy-induced pancytopenia. In the ER, she received a right nostril packing with a vasoconstrictor and the bleeding subsided. She was admitted for additional transfusions.   Hospital Course:   Epistaxis, stopped after Rhino Rocket placement and blood and platelet transfusions.  She was advised to leave her Rhino Rocket in place and not blow her nose, use ibuprofen or aspirin or blood thinners of any type. She will have a repeat CBC done by her oncologist in 1 day to determine if she needs additional blood and  platelet transfusions. If she experiences recurrent bleeding, she was advised to apply cold compress for 15 minutes. She should also call the ENT office.  If her bleeding does not subside in 15-20 minutes, she should come back to the emergency department if not directed to go to the ear, nose, throat office.  She will follow up to have a Rhino Rocket removed on Monday 7/18.  Metastatic pancreatic cancer, followed by Dr. Benay Spice and receiving chemotherapy with good response.  Pancytopenia secondary to chemotherapy. Dr. Benay Spice does not believe that she will become neutropenic however her hemoglobin and platelet count may continue to decline over the next few days.  She received 1 unit PRBC and platelets during this admission.  Her platelet count did not increase as much as was anticipated which may be secondary to some alloimmunization.  Her hemoglobin and platelet count will be repeated in one day at the oncology office.  Hypertension/hyperlipidemia, her home medications have been held by her primary care doctor. Although her blood pressure was mildly elevated during this admission, this may be due to stress from her recent epistaxis. I have advised her to continue to not take her atorvastatin and lisinopril. She should follow-up with her primary care doctor for reevaluation after resolution of this illness.  Procedure: Rhino Rocket placement on 7/12 by emergency department physician Consultations:  Dr. Benay Spice, Oncology, by phone  Discharge Exam: Filed Vitals:   06/13/15 0205  BP: 148/85  Pulse: 88  Temp: 98.5 F (36.9 C)  Resp: 18   Filed Vitals:   06/13/15 0050 06/13/15 0053 06/13/15 0116 06/13/15 0205  BP: 152/85  151/85 148/85  Pulse: 86  60 88  Temp: 98.4 F (36.9 C)  98.8 F (37.1 C) 98.5 F (36.9 C)  TempSrc: Oral Oral Oral Oral  Resp: 20  18 18   Height:      Weight:      SpO2: 99%  99% 98%    General: adult female, NAD HEENT:  rhinorocket saturated with blood but  not actively bleeding in the right nare.  Old blood in mouth.   Cardiovascular: RRR, no mrg, 2+ pulses Respiratory: CTAB, rhonchorous cough, but no rhonchi, wheezes, or rales on exam ABD:  NABS, soft, ND/NT MSK:  No LEE  Discharge Instructions      Discharge Instructions    Call MD for:  difficulty breathing, headache or visual disturbances    Complete by:  As directed      Call MD for:  extreme fatigue    Complete by:  As directed      Call MD for:  hives    Complete by:  As directed      Call MD for:  persistant dizziness or light-headedness    Complete by:  As directed      Call MD for:  persistant nausea and vomiting    Complete by:  As directed      Call MD for:  redness, tenderness, or signs of infection (pain, swelling, redness, odor or green/yellow discharge around incision site)    Complete by:  As directed      Call MD for:  severe uncontrolled pain    Complete by:  As directed      Call MD for:  temperature >100.4    Complete by:  As directed      Diet general    Complete by:  As directed      Discharge instructions    Complete by:  As directed   You were hospitalized with a nose bleed.  Please do not remove your packing.  Do not take aspirin, ibuprofen, or any over the counter medication for pain other than tylenol.  If you notice you are bleeding despite the packing or if your packing falls out and your bleeding restarts, call Dr. Janeice Robinson office right away.  If your bleeding does not stop in 15-20 minutes, come to the emergency department.  Follow up with the Ear, Nose, Throat clinic (Dr. Constance Holster) on Monday, July 18th at your already scheduled appointment.  If you develop fevers or severe sinus pain, come to the ER immediately.     Increase activity slowly    Complete by:  As directed             Medication List    STOP taking these medications        atorvastatin 10 MG tablet  Commonly known as:  LIPITOR     ibuprofen 200 MG tablet  Commonly known as:   ADVIL,MOTRIN     lisinopril 40 MG tablet  Commonly known as:  PRINIVIL,ZESTRIL      TAKE these medications        calcium carbonate 500 MG chewable tablet  Commonly known as:  TUMS - dosed in mg elemental calcium  Chew 1 tablet by mouth daily as needed for indigestion or heartburn (heartburn).     lidocaine-prilocaine cream  Commonly known as:  EMLA  Apply 1 application topically as needed. Apply to port 1-2 hours prior to stick and cover with plastic wrap     loperamide 2 MG capsule  Commonly  known as:  IMODIUM  Take 2 mg by mouth as needed for diarrhea or loose stools.     LORazepam 0.5 MG tablet  Commonly known as:  ATIVAN  TAKE 1 TABLET BY MOUTH EVERY 6 TO 8 HOURS AS NEEDED SEVERE ANXIETY     ondansetron 8 MG tablet  Commonly known as:  ZOFRAN  TAKE 1 TABLET (8 MG TOTAL) BY MOUTH EVERY 8 (EIGHT) HOURS AS NEEDED FOR NAUSEA OR VOMITING.     potassium chloride 10 MEQ tablet  Commonly known as:  KLOR-CON M20  Take 1 tablet (10 mEq total) by mouth 2 (two) times daily. Take 2 capsules 3 times daily for total of 60 mEq.     PRESCRIPTION MEDICATION  Chemotherapy at [facility]     promethazine 12.5 MG tablet  Commonly known as:  PHENERGAN  Take 1/2 tablet to 1 tablet every 6 hours as needed for nausea       Follow-up Information    Schedule an appointment as soon as possible for a visit with Eulas Post, MD.   Specialty:  Family Medicine   Why:  As needed   Contact information:   [place] [address] [phone]       Follow up with Izora Gala, MD On 06/18/2015.   Specialty:  Otolaryngology   Why:  1:40PM to have packing removed from nose   Contact information:   [address] [place] [address] [place] [address] [phone]       Follow up with Betsy Coder, MD On 06/14/2015.   Specialty:  Oncology   Why:  repeat blood counts   Contact information:   [place] [address] [phone]        The  results of significant diagnostics from this hospitalization (including imaging, microbiology, ancillary and laboratory) are listed below for reference.    Significant Diagnostic Studies: Dg Chest 2 View  05/16/2015   CLINICAL DATA:  Followup of right pleural effusion, history of adenocarcinoma of the head of the pancreas  EXAM: CHEST  2 VIEW  COMPARISON:  Chest x-ray of 04/18/2015  FINDINGS: There has been decrease in volume of the small left pleural effusion with mild left basilar atelectasis remaining. The right lung is clear. Mediastinal and hilar contours are unremarkable. Left Port-A-Cath is present with the tip seen to the mid SVC. The heart is within normal limits in size. There are mild degenerative changes in the mid to lower thoracic spine.  IMPRESSION: Decrease in volume of small left pleural effusion.   Electronically Signed   By: Ivar Drape M.D.   On: 05/16/2015 08:38    Microbiology: No results found for this or any previous visit (from the past 240 hour(s)).   Labs: Basic Metabolic Panel:  Recent Labs Lab 06/12/15 1156 06/13/15 0538  NA 140 140  K 3.5 3.6  CL  --  110  CO2 23 24  GLUCOSE 82 81  BUN 8.8 10  CREATININE 0.6 0.48  CALCIUM 7.9* 7.8*   Liver Function Tests:  Recent Labs Lab 06/12/15 1156 06/13/15 0538  AST 80* 65*  ALT 50 39  ALKPHOS 322* 233*  BILITOT 1.11 1.7*  PROT 5.4* 5.5*  ALBUMIN 1.9* 2.1*   No results for input(s): LIPASE, AMYLASE in the last 168 hours. No results for input(s): AMMONIA in the last 168 hours. CBC:  Recent Labs Lab 06/12/15 1157 06/12/15 1905 06/13/15 0538  WBC 5.4 2.5* 2.3*  NEUTROABS 4.9 2.1  --  HGB 6.6* 6.5* 7.5*  HCT 21.1* 20.8* 22.7*  MCV 105.5* 101.5* 96.6  PLT 12* 12* 25*   Cardiac Enzymes: No results for input(s): CKTOTAL, CKMB, CKMBINDEX, TROPONINI in the last 168 hours. BNP: BNP (last 3 results) No results for input(s): BNP in the last 8760 hours.  ProBNP (last 3 results) No results for  input(s): PROBNP in the last 8760 hours.  CBG: No results for input(s): GLUCAP in the last 168 hours.  Time coordinating discharge: 35 minutes  Signed:  Quincee Gittens  [facility] 06/13/2015, 10:22 AM

## 2015-06-13 NOTE — Telephone Encounter (Signed)
lvm for pt regarding to July appt. °

## 2015-06-14 ENCOUNTER — Ambulatory Visit: Payer: Medicare Other

## 2015-06-14 ENCOUNTER — Telehealth: Payer: Self-pay | Admitting: Oncology

## 2015-06-14 ENCOUNTER — Other Ambulatory Visit: Payer: Self-pay | Admitting: *Deleted

## 2015-06-14 ENCOUNTER — Telehealth: Payer: Self-pay | Admitting: *Deleted

## 2015-06-14 ENCOUNTER — Other Ambulatory Visit (HOSPITAL_BASED_OUTPATIENT_CLINIC_OR_DEPARTMENT_OTHER): Payer: Medicare Other

## 2015-06-14 DIAGNOSIS — C250 Malignant neoplasm of head of pancreas: Secondary | ICD-10-CM

## 2015-06-14 LAB — CBC WITH DIFFERENTIAL/PLATELET
BASO%: 2.3 % — AB (ref 0.0–2.0)
BASOS ABS: 0 10*3/uL (ref 0.0–0.1)
EOS%: 0.9 % (ref 0.0–7.0)
Eosinophils Absolute: 0 10*3/uL (ref 0.0–0.5)
HCT: 24.8 % — ABNORMAL LOW (ref 34.8–46.6)
HGB: 8.1 g/dL — ABNORMAL LOW (ref 11.6–15.9)
LYMPH#: 0.1 10*3/uL — AB (ref 0.9–3.3)
LYMPH%: 11.6 % — ABNORMAL LOW (ref 14.0–49.7)
MCH: 31.9 pg (ref 25.1–34.0)
MCHC: 32.8 g/dL (ref 31.5–36.0)
MCV: 97.2 fL (ref 79.5–101.0)
MONO#: 0.1 10*3/uL (ref 0.1–0.9)
MONO%: 6.9 % (ref 0.0–14.0)
NEUT#: 0.9 10*3/uL — ABNORMAL LOW (ref 1.5–6.5)
NEUT%: 78.3 % — AB (ref 38.4–76.8)
PLATELETS: 16 10*3/uL — AB (ref 145–400)
RBC: 2.55 10*6/uL — ABNORMAL LOW (ref 3.70–5.45)
RDW: 18.7 % — ABNORMAL HIGH (ref 11.2–14.5)
WBC: 1.1 10*3/uL — ABNORMAL LOW (ref 3.9–10.3)

## 2015-06-14 LAB — COMPREHENSIVE METABOLIC PANEL (CC13)
ALBUMIN: 2 g/dL — AB (ref 3.5–5.0)
ALT: 41 U/L (ref 0–55)
AST: 68 U/L — ABNORMAL HIGH (ref 5–34)
Alkaline Phosphatase: 262 U/L — ABNORMAL HIGH (ref 40–150)
Anion Gap: 5 mEq/L (ref 3–11)
BUN: 8.7 mg/dL (ref 7.0–26.0)
CO2: 22 meq/L (ref 22–29)
Calcium: 8.2 mg/dL — ABNORMAL LOW (ref 8.4–10.4)
Chloride: 112 mEq/L — ABNORMAL HIGH (ref 98–109)
Creatinine: 0.6 mg/dL (ref 0.6–1.1)
EGFR: >90 mL/min/{1.73_m2} (ref >90)
Glucose: 121 mg/dl (ref 70–140)
POTASSIUM: 3.3 meq/L — AB (ref 3.5–5.1)
Sodium: 139 mEq/L (ref 136–145)
Total Bilirubin: 1.18 mg/dL (ref 0.20–1.20)
Total Protein: 5.7 g/dL — ABNORMAL LOW (ref 6.4–8.3)

## 2015-06-14 LAB — PREPARE PLATELET PHERESIS: UNIT DIVISION: 0

## 2015-06-14 NOTE — Progress Notes (Signed)
Pt in for labs.  Pt wanted to be added on for a flush.  PAC still accessed from Tuesday 06/12/2015.  Small blister to right lower side @ tape's edge.  PAC flushed and blood obtained through PAC.   Dressing removed. New dressing and biopatch placed.

## 2015-06-14 NOTE — Telephone Encounter (Signed)
CBC reviewed by Dr. Benay Spice: Order received for repeat CBC with possible PLT infusion 7/15. Pt reports scant bleeding from hemorrhoid when she wipes. Pt has one blood blister on R forearm. Pt and husband made aware. Neutropenic and thrombocytopenic precautions reviewed, both voiced understanding. Pt is concerned she is going to have a "platelet problem forever". Support given.

## 2015-06-14 NOTE — Telephone Encounter (Signed)
lvm fo rpt regarding to 7.15 appt

## 2015-06-15 ENCOUNTER — Ambulatory Visit: Payer: Medicare Other

## 2015-06-15 ENCOUNTER — Telehealth: Payer: Self-pay | Admitting: Oncology

## 2015-06-15 ENCOUNTER — Telehealth: Payer: Self-pay | Admitting: *Deleted

## 2015-06-15 ENCOUNTER — Ambulatory Visit (HOSPITAL_BASED_OUTPATIENT_CLINIC_OR_DEPARTMENT_OTHER): Payer: Medicare Other

## 2015-06-15 ENCOUNTER — Other Ambulatory Visit: Payer: Self-pay | Admitting: *Deleted

## 2015-06-15 ENCOUNTER — Other Ambulatory Visit (HOSPITAL_BASED_OUTPATIENT_CLINIC_OR_DEPARTMENT_OTHER): Payer: Medicare Other

## 2015-06-15 VITALS — BP 147/84 | HR 77 | Temp 98.6°F | Resp 18

## 2015-06-15 DIAGNOSIS — C250 Malignant neoplasm of head of pancreas: Secondary | ICD-10-CM

## 2015-06-15 DIAGNOSIS — D696 Thrombocytopenia, unspecified: Secondary | ICD-10-CM | POA: Diagnosis present

## 2015-06-15 DIAGNOSIS — D649 Anemia, unspecified: Secondary | ICD-10-CM | POA: Diagnosis not present

## 2015-06-15 DIAGNOSIS — Z95828 Presence of other vascular implants and grafts: Secondary | ICD-10-CM

## 2015-06-15 LAB — CBC WITH DIFFERENTIAL/PLATELET
BASO%: 2.6 % — AB (ref 0.0–2.0)
Basophils Absolute: 0 10*3/uL (ref 0.0–0.1)
EOS ABS: 0 10*3/uL (ref 0.0–0.5)
EOS%: 2.9 % (ref 0.0–7.0)
HCT: 24.2 % — ABNORMAL LOW (ref 34.8–46.6)
HEMOGLOBIN: 8 g/dL — AB (ref 11.6–15.9)
LYMPH%: 26.1 % (ref 14.0–49.7)
MCH: 31.8 pg (ref 25.1–34.0)
MCHC: 32.8 g/dL (ref 31.5–36.0)
MCV: 97 fL (ref 79.5–101.0)
MONO#: 0.1 10*3/uL (ref 0.1–0.9)
MONO%: 13.6 % (ref 0.0–14.0)
NEUT#: 0.3 10*3/uL — CL (ref 1.5–6.5)
NEUT%: 54.8 % (ref 38.4–76.8)
Platelets: 13 10*3/uL — ABNORMAL LOW (ref 145–400)
RBC: 2.5 10*6/uL — ABNORMAL LOW (ref 3.70–5.45)
RDW: 18.5 % — AB (ref 11.2–14.5)
WBC: 0.6 10*3/uL — CL (ref 3.9–10.3)
lymph#: 0.2 10*3/uL — ABNORMAL LOW (ref 0.9–3.3)

## 2015-06-15 LAB — PLATELET COUNT: PLATELETS: 24 10*3/uL — AB (ref 145–400)

## 2015-06-15 MED ORDER — SODIUM CHLORIDE 0.9 % IV SOLN
250.0000 mL | Freq: Once | INTRAVENOUS | Status: AC
  Administered 2015-06-15: 250 mL via INTRAVENOUS

## 2015-06-15 MED ORDER — ACETAMINOPHEN 325 MG PO TABS
650.0000 mg | ORAL_TABLET | Freq: Once | ORAL | Status: AC
  Administered 2015-06-15: 650 mg via ORAL

## 2015-06-15 MED ORDER — DIPHENHYDRAMINE HCL 25 MG PO CAPS
25.0000 mg | ORAL_CAPSULE | Freq: Once | ORAL | Status: AC
  Administered 2015-06-15: 25 mg via ORAL

## 2015-06-15 MED ORDER — SODIUM CHLORIDE 0.9 % IJ SOLN
10.0000 mL | INTRAMUSCULAR | Status: AC | PRN
  Administered 2015-06-15: 10 mL
  Filled 2015-06-15: qty 10

## 2015-06-15 MED ORDER — CIPROFLOXACIN HCL 500 MG PO TABS: 500.0000 mg | ORAL_TABLET | Freq: Two times a day (BID) | ORAL | Status: DC

## 2015-06-15 MED ORDER — DIPHENHYDRAMINE HCL 25 MG PO CAPS
ORAL_CAPSULE | ORAL | Status: AC
  Filled 2015-06-15: qty 1

## 2015-06-15 MED ORDER — HEPARIN SOD (PORK) LOCK FLUSH 100 UNIT/ML IV SOLN
500.0000 [IU] | Freq: Every day | INTRAVENOUS | Status: AC | PRN
  Administered 2015-06-15: 500 [IU]
  Filled 2015-06-15: qty 5

## 2015-06-15 MED ORDER — SODIUM CHLORIDE 0.9 % IJ SOLN
10.0000 mL | INTRAMUSCULAR | Status: DC | PRN
  Administered 2015-06-15: 10 mL via INTRAVENOUS
  Filled 2015-06-15: qty 10

## 2015-06-15 MED ORDER — ACETAMINOPHEN 325 MG PO TABS
ORAL_TABLET | ORAL | Status: AC
  Filled 2015-06-15: qty 2

## 2015-06-15 NOTE — Telephone Encounter (Signed)
lvm for pt regarding to 7.18 appt....pt ok and aware

## 2015-06-15 NOTE — Patient Instructions (Signed)

## 2015-06-15 NOTE — Progress Notes (Signed)
Platelet count up to 24k, reviewed with Dr. Benay Spice. OK to discharge home, repeat CBC on Monday. Port de-accessed. 3.5cm linear blister noted at medial edge of dressing. Pt reports itching. No other blisters noted. Appears to be a tape reaction. Instructed pt to call office if she notices any more blistering. Gauze dressing applied to port site.

## 2015-06-15 NOTE — Progress Notes (Signed)
Pt stated she is taking her kdur daily. I instructed her to continue it as prescribed.

## 2015-06-15 NOTE — Telephone Encounter (Signed)
CBC with critical WBC, ANC reviewed by Dr. Benay Spice. Pt to call office for fever, shaking chills. Continue neutropenic precautions. Order received for Cipro 500 mg daily. Pt will be transfused platelets today. Will need a post platelet count today, repeat CBC on 7/18. Spoke with pt and husband, above reviewed. Both voiced understanding.

## 2015-06-15 NOTE — Patient Instructions (Signed)

## 2015-06-16 ENCOUNTER — Other Ambulatory Visit: Payer: Self-pay | Admitting: Oncology

## 2015-06-16 LAB — TYPE AND SCREEN
ABO/RH(D): O POS
Antibody Screen: NEGATIVE
UNIT DIVISION: 0
Unit division: 0
Unit division: 0

## 2015-06-18 ENCOUNTER — Other Ambulatory Visit (HOSPITAL_BASED_OUTPATIENT_CLINIC_OR_DEPARTMENT_OTHER): Payer: Medicare Other

## 2015-06-18 ENCOUNTER — Telehealth: Payer: Self-pay | Admitting: *Deleted

## 2015-06-18 DIAGNOSIS — D696 Thrombocytopenia, unspecified: Secondary | ICD-10-CM | POA: Diagnosis present

## 2015-06-18 LAB — CBC WITH DIFFERENTIAL/PLATELET
BASO%: 1.2 % (ref 0.0–2.0)
Basophils Absolute: 0 10*3/uL (ref 0.0–0.1)
EOS ABS: 0 10*3/uL (ref 0.0–0.5)
EOS%: 1.6 % (ref 0.0–7.0)
HEMATOCRIT: 25.8 % — AB (ref 34.8–46.6)
HEMOGLOBIN: 8.4 g/dL — AB (ref 11.6–15.9)
LYMPH%: 11.9 % — ABNORMAL LOW (ref 14.0–49.7)
MCH: 31.9 pg (ref 25.1–34.0)
MCHC: 32.4 g/dL (ref 31.5–36.0)
MCV: 98.5 fL (ref 79.5–101.0)
MONO#: 0.3 10*3/uL (ref 0.1–0.9)
MONO%: 13.7 % (ref 0.0–14.0)
NEUT#: 1.6 10*3/uL (ref 1.5–6.5)
NEUT%: 71.6 % (ref 38.4–76.8)
Platelets: 31 10*3/uL — ABNORMAL LOW (ref 145–400)
RBC: 2.62 10*6/uL — ABNORMAL LOW (ref 3.70–5.45)
RDW: 19.5 % — ABNORMAL HIGH (ref 11.2–14.5)
WBC: 2.3 10*3/uL — AB (ref 3.9–10.3)
lymph#: 0.3 10*3/uL — ABNORMAL LOW (ref 0.9–3.3)

## 2015-06-18 LAB — PREPARE PLATELET PHERESIS: UNIT DIVISION: 0

## 2015-06-18 NOTE — Telephone Encounter (Signed)
CBC reviewed by Dr. Benay Spice. OK to discontinue Cipro. Pt is scheduled to see ENT today to have nasal packing removed. Per Dr. Benay Spice: Keep lab/ office visit as scheduled. Will reschedule chemo appointment. Discussed with pt's husband. Mr. Wimmer voiced understanding.

## 2015-06-18 NOTE — Telephone Encounter (Signed)
Per staff message and POF I have scheduled appts. Advised scheduler of appts. JMW  

## 2015-06-20 ENCOUNTER — Ambulatory Visit (HOSPITAL_BASED_OUTPATIENT_CLINIC_OR_DEPARTMENT_OTHER): Payer: Medicare Other | Admitting: Nurse Practitioner

## 2015-06-20 ENCOUNTER — Other Ambulatory Visit (HOSPITAL_BASED_OUTPATIENT_CLINIC_OR_DEPARTMENT_OTHER): Payer: Medicare Other

## 2015-06-20 ENCOUNTER — Ambulatory Visit: Payer: Medicare Other

## 2015-06-20 ENCOUNTER — Telehealth: Payer: Self-pay | Admitting: Oncology

## 2015-06-20 VITALS — BP 152/82 | HR 78 | Temp 98.4°F | Resp 18 | Ht 63.5 in | Wt 147.3 lb

## 2015-06-20 DIAGNOSIS — C250 Malignant neoplasm of head of pancreas: Secondary | ICD-10-CM

## 2015-06-20 DIAGNOSIS — D6481 Anemia due to antineoplastic chemotherapy: Secondary | ICD-10-CM | POA: Diagnosis not present

## 2015-06-20 DIAGNOSIS — D6959 Other secondary thrombocytopenia: Secondary | ICD-10-CM | POA: Diagnosis not present

## 2015-06-20 DIAGNOSIS — C257 Malignant neoplasm of other parts of pancreas: Secondary | ICD-10-CM

## 2015-06-20 DIAGNOSIS — E876 Hypokalemia: Secondary | ICD-10-CM | POA: Diagnosis not present

## 2015-06-20 LAB — CBC WITH DIFFERENTIAL/PLATELET
BASO%: 0.8 % (ref 0.0–2.0)
BASOS ABS: 0 10*3/uL (ref 0.0–0.1)
EOS ABS: 0 10*3/uL (ref 0.0–0.5)
EOS%: 1.5 % (ref 0.0–7.0)
HCT: 28.2 % — ABNORMAL LOW (ref 34.8–46.6)
HEMOGLOBIN: 9.1 g/dL — AB (ref 11.6–15.9)
LYMPH#: 0.3 10*3/uL — AB (ref 0.9–3.3)
LYMPH%: 9.9 % — ABNORMAL LOW (ref 14.0–49.7)
MCH: 32 pg (ref 25.1–34.0)
MCHC: 32.4 g/dL (ref 31.5–36.0)
MCV: 98.8 fL (ref 79.5–101.0)
MONO#: 0.3 10*3/uL (ref 0.1–0.9)
MONO%: 9.2 % (ref 0.0–14.0)
NEUT#: 2.4 10*3/uL (ref 1.5–6.5)
NEUT%: 78.6 % — AB (ref 38.4–76.8)
Platelets: 57 10*3/uL — ABNORMAL LOW (ref 145–400)
RBC: 2.85 10*6/uL — AB (ref 3.70–5.45)
RDW: 19.3 % — ABNORMAL HIGH (ref 11.2–14.5)
WBC: 3.1 10*3/uL — ABNORMAL LOW (ref 3.9–10.3)

## 2015-06-20 LAB — CANCER ANTIGEN 19-9: CA 19-9: 67.3 U/mL — ABNORMAL HIGH (ref <35.0)

## 2015-06-20 LAB — COMPREHENSIVE METABOLIC PANEL (CC13)
ALBUMIN: 2.2 g/dL — AB (ref 3.5–5.0)
ALK PHOS: 307 U/L — AB (ref 40–150)
ALT: 37 U/L (ref 0–55)
ANION GAP: 6 meq/L (ref 3–11)
AST: 70 U/L — ABNORMAL HIGH (ref 5–34)
BILIRUBIN TOTAL: 0.65 mg/dL (ref 0.20–1.20)
BUN: 3.3 mg/dL — ABNORMAL LOW (ref 7.0–26.0)
CALCIUM: 8.6 mg/dL (ref 8.4–10.4)
CO2: 26 mEq/L (ref 22–29)
CREATININE: 0.7 mg/dL (ref 0.6–1.1)
Chloride: 112 mEq/L — ABNORMAL HIGH (ref 98–109)
GLUCOSE: 131 mg/dL (ref 70–140)
POTASSIUM: 2.7 meq/L — AB (ref 3.5–5.1)
SODIUM: 144 meq/L (ref 136–145)
Total Protein: 5.9 g/dL — ABNORMAL LOW (ref 6.4–8.3)

## 2015-06-20 NOTE — Progress Notes (Addendum)
[place] OFFICE PROGRESS NOTE   Diagnosis:  Pancreas cancer  INTERVAL HISTORY:   Sonya Larson returns as scheduled. She completed cycle 6 FOLFIRINOX on 06/06/2015. She subsequent developed anemia and severe thrombocytopenia requiring transfusion support. She developed epistaxis and was hospitalized. Packing was placed and she was referred to ENT for removal.  The packing was removed on 06/18/2015. She has had no further bleeding. She continues to have loose stools 2-3 times every morning. She takes Imodium and has no further bowel movements during the day. She has very mild nausea that does not require medication. Appetite has improved this week. She denies any pain. Leg edema has resolved. She reports consistent numbness in the hands and feet. She occasionally drops items.  Objective:  Vital signs in last 24 hours:  Blood pressure 152/82, pulse 78, temperature 98.4 F (36.9 C), temperature source Oral, resp. rate 18, height 5' 3.5" (1.613 m), weight 147 lb 4.8 oz (66.815 kg), last menstrual period 10/31/2005, SpO2 100 %.    HEENT: No thrush or ulcers. Resp: Lungs clear bilaterally. Cardio: Regular rate and rhythm. GI: Abdomen is soft. Question palpable liver edge. Vascular: No leg edema. The left lower leg is slightly larger than the right lower leg. Neuro: Vibratory sense mildly to moderately decreased over the fingertips per tuning fork exam.  Port-A-Cath without erythema.    Lab Results:  Lab Results  Component Value Date   WBC 3.1* 06/20/2015   HGB 9.1* 06/20/2015   HCT 28.2* 06/20/2015   MCV 98.8 06/20/2015   PLT 57* 06/20/2015   NEUTROABS 2.4 06/20/2015    Imaging:  No results found.  Medications: I have reviewed the patient's current medications.  Assessment/Plan: 1. Adenocarcinoma the pancreas, uncinate mass, EUS April 2015 revealed a uT3,uN1 lesion with an FNA biopsy confirming adenocarcinoma  MRI abdomen 07/04/2014 confirmed a pancreas  uncinate 8 mass, 11 mm portacaval lymph node, no vascular involvement, and no evidence of distant metastatic disease   PET scan 07/20/2012 with a hypermetabolic uncinate process mass and hypermetabolic periportal lymph node   Markedly elevated CA 19-9   Negative diagnostic laparoscopy 07/24/2014   Initiation of Xeloda/radiation 08/10/2014.completed 09/21/2014.  CT 10/03/2014 confirmed a decrease in the pancreas mass, stable portacaval lymph node, and apparent involvement of the superior mesenteric artery  Pancreaticoduodenectomy on 11/02/2014 confirmed a poorly differentiated adenocarcinoma, ypT2,ypN0  Initiation of adjuvant gemcitabine 12/27/2014  Rising CA 19-07 February 2015  Left thoracentesis 03/08/2015 confirmed metastatic adenocarcinoma  CTs 03/16/2015 with a left pleural effusion and new right lung nodule  Cycle 1 FOLFIRINOX 03/21/2015  Cycle 2 FOLFIRINOX 04/04/2015  Cycle 3 FOLFIRINOX 04/18/2015  Cycle 4 FOLFIRINOX 05/02/2015  Cycle 5 FOLFIRINOX 05/16/2015 (oxaliplatin dose reduced secondary to thrombocytopenia)  Chemotherapy held 05/30/2015 due to thrombocytopenia and diarrhea.  Cycle 6 FOLFIRINOX 06/06/2015 2. Hypertension 3. Hyperlipidemia  4. Vaginal spotting summer 2015-evaluated by gynecology  5. Zoster rash at June 2015  6. Asthma  7. Family history of pancreas cancer 8. Nausea following the pancreaticoduodenectomy procedure. Improved. 9. Anemia secondary to chemotherapy; progressive anemia 06/12/2015 10. Admission with a fever 02/09/2015-no source for infection identified, treated for presumptive pneumonia 11. Left pleural effusion. Slightly larger on follow-up chest x-ray 03/06/2015. Status post a diagnostic/therapeutic thoracentesis 03/08/2015 cytology confirmed metastatic adenocarcinoma 12. Dysarthria/tongue numbness at the completion of oxaliplatin with cycle 1 FOLFIRINOX 03/21/2015-spontaneously resolved over hours, similar symptoms including  diplopia following subsequent cycles of FOLFIRINOX 13. Hypokalemia-likely secondary to diarrhea, she is maintained on a potassium supplement 14.  Thrombocytopenia secondary to chemotherapy-chemotherapy held 05/30/2015; marked thrombocytopenia (12,000) 06/12/2015 15. Epistaxis 06/12/2015 secondary to #14. Status post packing and follow up with ENT. Packing removed 06/18/2015.    Disposition: Sonya Larson has completed 6 cycles of FOLFIRINOX. Most recent CA-19-9 was further improved. She developed severe anemia, thrombocytopenia and epistaxis following cycle 6. She required transfusion support and was admitted. The nose was packed. The packing was removed on 06/18/2015. She has had no further bleeding. The platelet count is recovering.  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 We will reevaluate in one week and may hold the oxaliplatin with cycle 7.  She has hypokalemia which is likely secondary to diarrhea. She has not been taking potassium as prescribed. She will resume as previously prescribed. We will obtain repeat labs she returns in one week.  She will return for a follow-up visit and cycle 7 FOLFIRINOX in one week. She will contact the office in the interim with any problems.  Patient seen with Dr. Benay Spice. 25 minutes were spent face-to-face at today's visit with the majority of that time involving counseling/coordination of care.    Ned Card ANP/GNP-BC   06/20/2015  10:08 AM  This was a shared visit with Ned Card.  She developed severe thrombocytopenia following the most recent cycle of chemotherapy requiring platelet transfusions and packing of the nose for epistaxis.  Chemotherapy will be held today and she will return for reassessment next week.  Julieanne Manson, M.D.

## 2015-06-20 NOTE — Telephone Encounter (Signed)
per pof to sch pt appt-gave pt copy of avs-sent Mw emai to sch pt trmt-pt aware of appt-

## 2015-06-20 NOTE — [facility] Oncology Navigator Note (Signed)
Oncology Nurse Navigator Documentation  Oncology Nurse Navigator Flowsheets 06/20/2015  Navigator Encounter Type Other-F/U nosebleed  Patient Visit Type Medonc  Treatment Phase Treatment s/p cycle #6  Barriers/Navigation Needs No barriers at this time  Interventions None required  Support Groups/Services -  Time Spent with Patient 15  Reports packing removed from nose and has had no further nosebleeds or any other bleeding. Cough persists and is worse at night. Has not tried OTC-instructed her to try Delsym cough med. Spirits better this week-family is supportive and know how to get her out of her sad/depressed states. Pleased with her CBC results today.

## 2015-06-22 ENCOUNTER — Ambulatory Visit: Payer: Medicare Other

## 2015-06-27 ENCOUNTER — Ambulatory Visit: Payer: Medicare Other | Admitting: Oncology

## 2015-06-27 ENCOUNTER — Ambulatory Visit: Payer: Medicare Other

## 2015-06-27 ENCOUNTER — Ambulatory Visit (HOSPITAL_BASED_OUTPATIENT_CLINIC_OR_DEPARTMENT_OTHER): Payer: Medicare Other

## 2015-06-27 ENCOUNTER — Telehealth: Payer: Self-pay | Admitting: Oncology

## 2015-06-27 ENCOUNTER — Other Ambulatory Visit (HOSPITAL_BASED_OUTPATIENT_CLINIC_OR_DEPARTMENT_OTHER): Payer: Medicare Other

## 2015-06-27 ENCOUNTER — Other Ambulatory Visit: Payer: Medicare Other

## 2015-06-27 ENCOUNTER — Ambulatory Visit (HOSPITAL_BASED_OUTPATIENT_CLINIC_OR_DEPARTMENT_OTHER): Payer: Medicare Other | Admitting: Oncology

## 2015-06-27 VITALS — BP 138/78 | HR 81 | Temp 98.4°F | Resp 18 | Ht 63.5 in | Wt 150.0 lb

## 2015-06-27 DIAGNOSIS — C250 Malignant neoplasm of head of pancreas: Secondary | ICD-10-CM

## 2015-06-27 DIAGNOSIS — C257 Malignant neoplasm of other parts of pancreas: Secondary | ICD-10-CM | POA: Diagnosis present

## 2015-06-27 DIAGNOSIS — D6959 Other secondary thrombocytopenia: Secondary | ICD-10-CM

## 2015-06-27 DIAGNOSIS — Z95828 Presence of other vascular implants and grafts: Secondary | ICD-10-CM

## 2015-06-27 DIAGNOSIS — Z5111 Encounter for antineoplastic chemotherapy: Secondary | ICD-10-CM

## 2015-06-27 LAB — CBC WITH DIFFERENTIAL/PLATELET
BASO%: 1 % (ref 0.0–2.0)
BASOS ABS: 0 10*3/uL (ref 0.0–0.1)
EOS%: 1.1 % (ref 0.0–7.0)
Eosinophils Absolute: 0 10*3/uL (ref 0.0–0.5)
HEMATOCRIT: 27 % — AB (ref 34.8–46.6)
HGB: 8.7 g/dL — ABNORMAL LOW (ref 11.6–15.9)
LYMPH#: 0.3 10*3/uL — AB (ref 0.9–3.3)
LYMPH%: 9.4 % — ABNORMAL LOW (ref 14.0–49.7)
MCH: 32.5 pg (ref 25.1–34.0)
MCHC: 32.1 g/dL (ref 31.5–36.0)
MCV: 101.2 fL — AB (ref 79.5–101.0)
MONO#: 0.5 10*3/uL (ref 0.1–0.9)
MONO%: 14 % (ref 0.0–14.0)
NEUT%: 74.5 % (ref 38.4–76.8)
NEUTROS ABS: 2.5 10*3/uL (ref 1.5–6.5)
Platelets: 106 10*3/uL — ABNORMAL LOW (ref 145–400)
RBC: 2.67 10*6/uL — ABNORMAL LOW (ref 3.70–5.45)
RDW: 20.5 % — AB (ref 11.2–14.5)
WBC: 3.3 10*3/uL — AB (ref 3.9–10.3)

## 2015-06-27 LAB — COMPREHENSIVE METABOLIC PANEL (CC13)
ALBUMIN: 2 g/dL — AB (ref 3.5–5.0)
ALK PHOS: 343 U/L — AB (ref 40–150)
ALT: 60 U/L — AB (ref 0–55)
AST: 145 U/L — AB (ref 5–34)
Anion Gap: 6 mEq/L (ref 3–11)
BUN: 6.7 mg/dL — ABNORMAL LOW (ref 7.0–26.0)
CALCIUM: 7.9 mg/dL — AB (ref 8.4–10.4)
CO2: 25 meq/L (ref 22–29)
Chloride: 113 mEq/L — ABNORMAL HIGH (ref 98–109)
Creatinine: 0.6 mg/dL (ref 0.6–1.1)
EGFR: >90 mL/min/{1.73_m2} (ref >90)
Glucose: 110 mg/dl (ref 70–140)
Potassium: 3.7 mEq/L (ref 3.5–5.1)
Sodium: 143 mEq/L (ref 136–145)
Total Bilirubin: 1.19 mg/dL (ref 0.20–1.20)
Total Protein: 5.7 g/dL — ABNORMAL LOW (ref 6.4–8.3)

## 2015-06-27 MED ORDER — SODIUM CHLORIDE 0.9 % IV SOLN
Freq: Once | INTRAVENOUS | Status: AC
  Administered 2015-06-27: 11:00:00 via INTRAVENOUS
  Filled 2015-06-27: qty 8

## 2015-06-27 MED ORDER — OXALIPLATIN CHEMO INJECTION 100 MG/20ML
35.0000 mg/m2 | Freq: Once | INTRAVENOUS | Status: AC
  Administered 2015-06-27: 65 mg via INTRAVENOUS
  Filled 2015-06-27: qty 13

## 2015-06-27 MED ORDER — LEUCOVORIN CALCIUM INJECTION 350 MG
400.0000 mg/m2 | Freq: Once | INTRAVENOUS | Status: AC
  Administered 2015-06-27: 720 mg via INTRAVENOUS
  Filled 2015-06-27: qty 36

## 2015-06-27 MED ORDER — ATROPINE SULFATE 1 MG/ML IJ SOLN
INTRAMUSCULAR | Status: AC
  Filled 2015-06-27: qty 1

## 2015-06-27 MED ORDER — FLUOROURACIL CHEMO INJECTION 5 GM/100ML
2400.0000 mg/m2 | INTRAVENOUS | Status: DC
  Administered 2015-06-27: 4300 mg via INTRAVENOUS
  Filled 2015-06-27: qty 86

## 2015-06-27 MED ORDER — SODIUM CHLORIDE 0.9 % IJ SOLN
10.0000 mL | INTRAMUSCULAR | Status: DC | PRN
  Administered 2015-06-27: 10 mL via INTRAVENOUS
  Filled 2015-06-27: qty 10

## 2015-06-27 MED ORDER — ATROPINE SULFATE 1 MG/ML IJ SOLN
0.5000 mg | Freq: Once | INTRAMUSCULAR | Status: AC | PRN
  Administered 2015-06-27: 0.5 mg via INTRAVENOUS

## 2015-06-27 MED ORDER — DEXTROSE 5 % IV SOLN
133.0000 mg/m2 | Freq: Once | INTRAVENOUS | Status: AC
  Administered 2015-06-27: 240 mg via INTRAVENOUS
  Filled 2015-06-27: qty 12

## 2015-06-27 MED ORDER — DEXTROSE 5 % IV SOLN
Freq: Once | INTRAVENOUS | Status: AC
  Administered 2015-06-27: 11:00:00 via INTRAVENOUS

## 2015-06-27 NOTE — Patient Instructions (Signed)
[facility] Discharge Instructions for Patients Receiving Chemotherapy  Today you received the following chemotherapy agents: Oxaliplatin, Irinotecan, Leucovorin and Fluorouracil.   To help prevent nausea and vomiting after your treatment, we encourage you to take your nausea medication: Zofran. Take one every 8 hours as needed. Take Compazine every 6 hours as needed.    If you develop nausea and vomiting that is not controlled by your nausea medication, call the clinic.   BELOW ARE SYMPTOMS THAT SHOULD BE REPORTED IMMEDIATELY:  *FEVER GREATER THAN 100.5 F  *CHILLS WITH OR WITHOUT FEVER  NAUSEA AND VOMITING THAT IS NOT CONTROLLED WITH YOUR NAUSEA MEDICATION  *UNUSUAL SHORTNESS OF BREATH  *UNUSUAL BRUISING OR BLEEDING  TENDERNESS IN MOUTH AND THROAT WITH OR WITHOUT PRESENCE OF ULCERS  *URINARY PROBLEMS  *BOWEL PROBLEMS  UNUSUAL RASH Items with * indicate a potential emergency and should be followed up as soon as possible.  Feel free to call the clinic should you have any questions or concerns. The clinic phone number is [phone].  Please show the [facility] at check-in to the Emergency Department and triage nurse.

## 2015-06-27 NOTE — Telephone Encounter (Signed)
Pt confirmed labs/ov per 07/27 POF, gave pt AVS and Calendar.... KJ °

## 2015-06-27 NOTE — Progress Notes (Addendum)
Pt reports difficulty with forming speech began soon after Oxaliplatin was complete. Improving now. Denies any dizziness, vision changes or nausea. She reports this happens after every Oxaliplatin infusion, it usually goes away without intervention. Speech is clear, face is symmetrical. Pt is speaking slower than her usual.

## 2015-06-27 NOTE — Telephone Encounter (Signed)
Added appt per pof....kj gv and printed appt sched and avs

## 2015-06-27 NOTE — Progress Notes (Signed)
[place] OFFICE PROGRESS NOTE   Diagnosis: Pancreas cancer    INTERVAL HISTORY:   Ms. Passey returns as scheduled. She reports feeling better compared to when we saw her last week. No bleeding. Good appetite. She has diarrhea in the morning and in the afternoon. She is not taking pancreas enzyme replacement. The abdomen feels bloated.  Objective:  Vital signs in last 24 hours:  Blood pressure 138/78, pulse 81, temperature 98.4 F (36.9 C), temperature source Oral, resp. rate 18, height 5' 3.5" (1.613 m), weight 150 lb (68.04 kg), last menstrual period 10/31/2005, SpO2 99 %.    HEENT: No thrush or ulcers Resp: Lungs clear bilaterally Cardio: Regular rate and rhythm GI: Soft, nontender, no apparent ascites, no mass, no hepatomegaly Vascular: No leg edema   Portacath/PICC-without erythema  Lab Results:  Lab Results  Component Value Date   WBC 3.3* 06/27/2015   HGB 8.7* 06/27/2015   HCT 27.0* 06/27/2015   MCV 101.2* 06/27/2015   PLT 106* 06/27/2015   NEUTROABS 2.5 06/27/2015     Medications: I have reviewed the patient's current medications.  Assessment/Plan: 1. Adenocarcinoma the pancreas, uncinate mass, EUS April 2015 revealed a uT3,uN1 lesion with an FNA biopsy confirming adenocarcinoma  MRI abdomen 07/04/2014 confirmed a pancreas uncinate 8 mass, 11 mm portacaval lymph node, no vascular involvement, and no evidence of distant metastatic disease   PET scan 07/20/2012 with a hypermetabolic uncinate process mass and hypermetabolic periportal lymph node   Markedly elevated CA 19-9   Negative diagnostic laparoscopy 07/24/2014   Initiation of Xeloda/radiation 08/10/2014.completed 09/21/2014.  CT 10/03/2014 confirmed a decrease in the pancreas mass, stable portacaval lymph node, and apparent involvement of the superior mesenteric artery  Pancreaticoduodenectomy on 11/02/2014 confirmed a poorly differentiated adenocarcinoma,  ypT2,ypN0  Initiation of adjuvant gemcitabine 12/27/2014  Rising CA 19-07 February 2015  Left thoracentesis 03/08/2015 confirmed metastatic adenocarcinoma  CTs 03/16/2015 with a left pleural effusion and new right lung nodule  Cycle 1 FOLFIRINOX 03/21/2015  Cycle 2 FOLFIRINOX 04/04/2015  Cycle 3 FOLFIRINOX 04/18/2015  Cycle 4 FOLFIRINOX 05/02/2015  Cycle 5 FOLFIRINOX 05/16/2015 (oxaliplatin dose reduced secondary to thrombocytopenia)  Chemotherapy held 05/30/2015 due to thrombocytopenia and diarrhea.  Cycle 6 FOLFIRINOX 06/06/2015 2. Hypertension 3. Hyperlipidemia  4. Vaginal spotting summer 2015-evaluated by gynecology  5. Zoster rash at June 2015  6. Asthma  7. Family history of pancreas cancer 8. Nausea following the pancreaticoduodenectomy procedure. Improved. 9. Anemia secondary to chemotherapy; progressive anemia 06/12/2015 10. Admission with a fever 02/09/2015-no source for infection identified, treated for presumptive pneumonia 11. Left pleural effusion. Slightly larger on follow-up chest x-ray 03/06/2015. Status post a diagnostic/therapeutic thoracentesis 03/08/2015 cytology confirmed metastatic adenocarcinoma 12. Dysarthria/tongue numbness at the completion of oxaliplatin with cycle 1 FOLFIRINOX 03/21/2015-spontaneously resolved over hours, similar symptoms including diplopia following subsequent cycles of FOLFIRINOX 13. History of Hypokalemia-likely secondary to diarrhea, she is maintained on a potassium supplement 14. Thrombocytopenia secondary to chemotherapy-chemotherapy held 05/30/2015; marked thrombocytopenia (12,000) 06/12/2015 15. Epistaxis 06/12/2015 secondary to #14. Status post packing and follow up with ENT. Packing removed 06/18/2015.   Disposition:  Ms. Ishikawa has an improved performance status. The plan is to proceed with cycle 7 FOLFIRINOX today. The oxaliplatin and irinotecan will be dose reduced. She will resume pancreatic enzyme replacement for  the diarrhea and continue Imodium. Ms. Roper will return for an office visit and restaging chest x-ray in 2 weeks. She will contact us for increased diarrhea.  Betsy Coder, MD  06/27/2015  2:06  PM

## 2015-06-27 NOTE — Patient Instructions (Signed)

## 2015-06-28 ENCOUNTER — Other Ambulatory Visit: Payer: Medicare Other

## 2015-06-29 ENCOUNTER — Ambulatory Visit (HOSPITAL_BASED_OUTPATIENT_CLINIC_OR_DEPARTMENT_OTHER): Payer: Medicare Other

## 2015-06-29 ENCOUNTER — Ambulatory Visit: Payer: Medicare Other

## 2015-06-29 VITALS — BP 123/88 | HR 88 | Temp 98.5°F | Resp 16

## 2015-06-29 DIAGNOSIS — C250 Malignant neoplasm of head of pancreas: Secondary | ICD-10-CM

## 2015-06-29 DIAGNOSIS — C257 Malignant neoplasm of other parts of pancreas: Secondary | ICD-10-CM | POA: Diagnosis present

## 2015-06-29 MED ORDER — PEGFILGRASTIM INJECTION 6 MG/0.6ML ~~LOC~~
6.0000 mg | PREFILLED_SYRINGE | Freq: Once | SUBCUTANEOUS | Status: AC
  Administered 2015-06-29: 6 mg via SUBCUTANEOUS
  Filled 2015-06-29: qty 0.6

## 2015-06-29 MED ORDER — HEPARIN SOD (PORK) LOCK FLUSH 100 UNIT/ML IV SOLN
500.0000 [IU] | Freq: Once | INTRAVENOUS | Status: AC | PRN
Start: 2015-06-29 — End: 2015-06-29
  Administered 2015-06-29: 500 [IU]
  Filled 2015-06-29: qty 5

## 2015-06-29 MED ORDER — SODIUM CHLORIDE 0.9 % IJ SOLN
10.0000 mL | INTRAMUSCULAR | Status: DC | PRN
  Administered 2015-06-29: 10 mL
  Filled 2015-06-29: qty 10

## 2015-07-02 ENCOUNTER — Telehealth: Payer: Self-pay | Admitting: Oncology

## 2015-07-02 NOTE — Telephone Encounter (Signed)
Pt confirmed updated schedules.Marland Kitchen KJ

## 2015-07-08 ENCOUNTER — Other Ambulatory Visit: Payer: Self-pay | Admitting: Oncology

## 2015-07-10 ENCOUNTER — Ambulatory Visit (HOSPITAL_COMMUNITY)
Admission: RE | Admit: 2015-07-10 | Discharge: 2015-07-10 | Disposition: A | Discharge status: Home / Self Care | Payer: Medicare Other | Source: Ambulatory Visit | Attending: Oncology | Admitting: Oncology

## 2015-07-10 DIAGNOSIS — J90 Pleural effusion, not elsewhere classified: Secondary | ICD-10-CM | POA: Insufficient documentation

## 2015-07-10 DIAGNOSIS — C250 Malignant neoplasm of head of pancreas: Secondary | ICD-10-CM

## 2015-07-11 ENCOUNTER — Ambulatory Visit (HOSPITAL_BASED_OUTPATIENT_CLINIC_OR_DEPARTMENT_OTHER): Payer: Medicare Other | Admitting: Nurse Practitioner

## 2015-07-11 ENCOUNTER — Ambulatory Visit (HOSPITAL_COMMUNITY)
Admission: RE | Admit: 2015-07-11 | Discharge: 2015-07-11 | Disposition: A | Discharge status: Home / Self Care | Payer: Medicare Other | Source: Ambulatory Visit | Attending: Oncology | Admitting: Oncology

## 2015-07-11 ENCOUNTER — Other Ambulatory Visit: Payer: Self-pay | Admitting: Nurse Practitioner

## 2015-07-11 ENCOUNTER — Ambulatory Visit (HOSPITAL_BASED_OUTPATIENT_CLINIC_OR_DEPARTMENT_OTHER): Payer: Medicare Other

## 2015-07-11 ENCOUNTER — Ambulatory Visit: Payer: Medicare Other

## 2015-07-11 ENCOUNTER — Other Ambulatory Visit (HOSPITAL_BASED_OUTPATIENT_CLINIC_OR_DEPARTMENT_OTHER): Payer: Medicare Other

## 2015-07-11 VITALS — BP 128/80 | Temp 98.9°F | Resp 17 | Wt 147.0 lb

## 2015-07-11 VITALS — BP 131/78 | HR 73 | Temp 98.6°F | Resp 18

## 2015-07-11 DIAGNOSIS — R197 Diarrhea, unspecified: Secondary | ICD-10-CM | POA: Diagnosis not present

## 2015-07-11 DIAGNOSIS — C257 Malignant neoplasm of other parts of pancreas: Secondary | ICD-10-CM

## 2015-07-11 DIAGNOSIS — Z95828 Presence of other vascular implants and grafts: Secondary | ICD-10-CM

## 2015-07-11 DIAGNOSIS — C250 Malignant neoplasm of head of pancreas: Secondary | ICD-10-CM

## 2015-07-11 DIAGNOSIS — D696 Thrombocytopenia, unspecified: Secondary | ICD-10-CM | POA: Diagnosis not present

## 2015-07-11 DIAGNOSIS — E876 Hypokalemia: Secondary | ICD-10-CM | POA: Diagnosis not present

## 2015-07-11 DIAGNOSIS — D6481 Anemia due to antineoplastic chemotherapy: Secondary | ICD-10-CM

## 2015-07-11 DIAGNOSIS — R1110 Vomiting, unspecified: Secondary | ICD-10-CM

## 2015-07-11 DIAGNOSIS — D649 Anemia, unspecified: Secondary | ICD-10-CM

## 2015-07-11 LAB — COMPREHENSIVE METABOLIC PANEL (CC13)
ALT: 49 U/L (ref 0–55)
ANION GAP: 5 meq/L (ref 3–11)
AST: 103 U/L — ABNORMAL HIGH (ref 5–34)
Albumin: 2 g/dL — ABNORMAL LOW (ref 3.5–5.0)
Alkaline Phosphatase: 339 U/L — ABNORMAL HIGH (ref 40–150)
BUN: 6.7 mg/dL — ABNORMAL LOW (ref 7.0–26.0)
CHLORIDE: 110 meq/L — AB (ref 98–109)
CO2: 24 meq/L (ref 22–29)
CREATININE: 0.6 mg/dL (ref 0.6–1.1)
Calcium: 7.6 mg/dL — ABNORMAL LOW (ref 8.4–10.4)
EGFR: >90 mL/min/{1.73_m2} (ref >90)
GLUCOSE: 111 mg/dL (ref 70–140)
POTASSIUM: 2.9 meq/L — AB (ref 3.5–5.1)
Sodium: 139 mEq/L (ref 136–145)
Total Bilirubin: 1.96 mg/dL — ABNORMAL HIGH (ref 0.20–1.20)
Total Protein: 5.6 g/dL — ABNORMAL LOW (ref 6.4–8.3)

## 2015-07-11 LAB — CBC WITH DIFFERENTIAL/PLATELET
BASO%: 0.7 % (ref 0.0–2.0)
BASOS ABS: 0 10*3/uL (ref 0.0–0.1)
EOS%: 0.3 % (ref 0.0–7.0)
Eosinophils Absolute: 0 10*3/uL (ref 0.0–0.5)
HCT: 22.5 % — ABNORMAL LOW (ref 34.8–46.6)
HGB: 7.3 g/dL — ABNORMAL LOW (ref 11.6–15.9)
LYMPH%: 5.2 % — AB (ref 14.0–49.7)
MCH: 34.1 pg — AB (ref 25.1–34.0)
MCHC: 32.2 g/dL (ref 31.5–36.0)
MCV: 105.8 fL — AB (ref 79.5–101.0)
MONO#: 0.7 10*3/uL (ref 0.1–0.9)
MONO%: 12 % (ref 0.0–14.0)
NEUT#: 4.7 10*3/uL (ref 1.5–6.5)
NEUT%: 81.8 % — ABNORMAL HIGH (ref 38.4–76.8)
Platelets: 72 10*3/uL — ABNORMAL LOW (ref 145–400)
RBC: 2.13 10*6/uL — AB (ref 3.70–5.45)
RDW: 20.2 % — AB (ref 11.2–14.5)
WBC: 5.8 10*3/uL (ref 3.9–10.3)
lymph#: 0.3 10*3/uL — ABNORMAL LOW (ref 0.9–3.3)

## 2015-07-11 LAB — HOLD TUBE, BLOOD BANK

## 2015-07-11 LAB — PREPARE RBC (CROSSMATCH)

## 2015-07-11 MED ORDER — SODIUM CHLORIDE 0.9 % IV SOLN
250.0000 mL | Freq: Once | INTRAVENOUS | Status: AC
Start: 2015-07-11 — End: 2015-07-11
  Administered 2015-07-11: 250 mL via INTRAVENOUS

## 2015-07-11 MED ORDER — HEPARIN SOD (PORK) LOCK FLUSH 100 UNIT/ML IV SOLN
500.0000 [IU] | Freq: Every day | INTRAVENOUS | Status: AC | PRN
  Administered 2015-07-11: 500 [IU]
  Filled 2015-07-11: qty 5

## 2015-07-11 MED ORDER — SODIUM CHLORIDE 0.9 % IJ SOLN
10.0000 mL | INTRAMUSCULAR | Status: DC | PRN
  Administered 2015-07-11: 10 mL via INTRAVENOUS
  Filled 2015-07-11: qty 10

## 2015-07-11 MED ORDER — SODIUM CHLORIDE 0.9 % IJ SOLN
10.0000 mL | INTRAMUSCULAR | Status: AC | PRN
  Administered 2015-07-11: 10 mL
  Filled 2015-07-11: qty 10

## 2015-07-11 NOTE — Progress Notes (Signed)
[facility] called on Pt. For blood today.

## 2015-07-11 NOTE — Patient Instructions (Addendum)
Blood Transfusion Information WHAT IS A BLOOD TRANSFUSION? A transfusion is the replacement of blood or some of its parts. Blood is made up of multiple cells which provide different functions.  Red blood cells carry oxygen and are used for blood loss replacement.  White blood cells fight against infection.  Platelets control bleeding.  Plasma helps clot blood.  Other blood products are available for specialized needs, such as hemophilia or other clotting disorders. BEFORE THE TRANSFUSION  Who gives blood for transfusions?   You may be able to donate blood to be used at a later date on yourself (autologous donation).  Relatives can be asked to donate blood. This is generally not any safer than if you have received blood from a stranger. The same precautions are taken to ensure safety when a relative's blood is donated.  Healthy volunteers who are fully evaluated to make sure their blood is safe. This is blood bank blood. Transfusion therapy is the safest it has ever been in the practice of medicine. Before blood is taken from a donor, a complete history is taken to make sure that person has no history of diseases nor engages in risky social behavior (examples are intravenous drug use or sexual activity with multiple partners). The donor's travel history is screened to minimize risk of transmitting infections, such as malaria. The donated blood is tested for signs of infectious diseases, such as HIV and hepatitis. The blood is then tested to be sure it is compatible with you in order to minimize the chance of a transfusion reaction. If you or a relative donates blood, this is often done in anticipation of surgery and is not appropriate for emergency situations. It takes many days to process the donated blood. RISKS AND COMPLICATIONS Although transfusion therapy is very safe and saves many lives, the main dangers of transfusion include:   Getting an infectious disease.  Developing a  transfusion reaction. This is an allergic reaction to something in the blood you were given. Every precaution is taken to prevent this. The decision to have a blood transfusion has been considered carefully by your caregiver before blood is given. Blood is not given unless the benefits outweigh the risks. AFTER THE TRANSFUSION  Right after receiving a blood transfusion, you will usually feel much better and more energetic. This is especially true if your red blood cells have gotten low (anemic). The transfusion raises the level of the red blood cells which carry oxygen, and this usually causes an energy increase.  The nurse administering the transfusion will monitor you carefully for complications. HOME CARE INSTRUCTIONS  No special instructions are needed after a transfusion. You may find your energy is better. Speak with your caregiver about any limitations on activity for underlying diseases you may have. SEEK MEDICAL CARE IF:   Your condition is not improving after your transfusion.  You develop redness or irritation at the intravenous (IV) site. SEEK IMMEDIATE MEDICAL CARE IF:  Any of the following symptoms occur over the next 12 hours:  Shaking chills.  You have a temperature by mouth above 102 F (38.9 C), not controlled by medicine.  Chest, back, or muscle pain.  People around you feel you are not acting correctly or are confused.  Shortness of breath or difficulty breathing.  Dizziness and fainting.  You get a rash or develop hives.  You have a decrease in urine output.  Your urine turns a dark color or changes to pink, red, or brown. Any of the following   symptoms occur over the next 10 days:  You have a temperature by mouth above 102 F (38.9 C), not controlled by medicine.  Shortness of breath.  Weakness after normal activity.  The white part of the eye turns yellow (jaundice).  You have a decrease in the amount of urine or are urinating less often.  Your  urine turns a dark color or changes to pink, red, or brown. Document Released: 11/14/2000 Document Revised: 02/09/2012 Document Reviewed: 07/03/2008 [facility] Patient Information 2015 [place], [place]. This information is not intended to replace advice given to you by your health care provider. Make sure you discuss any questions you have with your health care provider. Anemia, Nonspecific Anemia is a condition in which the concentration of red blood cells or hemoglobin in the blood is below normal. Hemoglobin is a substance in red blood cells that carries oxygen to the tissues of the body. Anemia results in not enough oxygen reaching these tissues.  CAUSES  Common causes of anemia include:   Excessive bleeding. Bleeding may be internal or external. This includes excessive bleeding from periods (in women) or from the intestine.   Poor nutrition.   Chronic kidney, thyroid, and liver disease.  Bone marrow disorders that decrease red blood cell production.  Cancer and treatments for cancer.  HIV, AIDS, and their treatments.  Spleen problems that increase red blood cell destruction.  Blood disorders.  Excess destruction of red blood cells due to infection, medicines, and autoimmune disorders. SIGNS AND SYMPTOMS   Minor weakness.   Dizziness.   Headache.  Palpitations.   Shortness of breath, especially with exercise.   Paleness.  Cold sensitivity.  Indigestion.  Nausea.  Difficulty sleeping.  Difficulty concentrating. Symptoms may occur suddenly or they may develop slowly.  DIAGNOSIS  Additional blood tests are often needed. These help your health care provider determine the best treatment. Your health care provider will check your stool for blood and look for other causes of blood loss.  TREATMENT  Treatment varies depending on the cause of the anemia. Treatment can include:   Supplements of iron, vitamin D17, or folic acid.   Hormone medicines.   A blood  transfusion. This may be needed if blood loss is severe.   Hospitalization. This may be needed if there is significant continual blood loss.   Dietary changes.  Spleen removal. HOME CARE INSTRUCTIONS Keep all follow-up appointments. It often takes many weeks to correct anemia, and having your health care provider check on your condition and your response to treatment is very important. SEEK IMMEDIATE MEDICAL CARE IF:   You develop extreme weakness, shortness of breath, or chest pain.   You become dizzy or have trouble concentrating.  You develop heavy vaginal bleeding.   You develop a rash.   You have bloody or black, tarry stools.   You faint.   You vomit up blood.   You vomit repeatedly.   You have abdominal pain.  You have a fever or persistent symptoms for more than 2-3 days.   You have a fever and your symptoms suddenly get worse.   You are dehydrated.  MAKE SURE YOU:  Understand these instructions.  Will watch your condition.  Will get help right away if you are not doing well or get worse. Document Released: 12/25/2004 Document Revised: 07/20/2013 Document Reviewed: 05/13/2013 [facility] Patient Information 2015 [place], [place]. This information is not intended to replace advice given to you by your health care provider. Make sure you discuss any questions  you have with your health care provider.

## 2015-07-11 NOTE — Patient Instructions (Signed)

## 2015-07-11 NOTE — Progress Notes (Addendum)
[facility] OFFICE PROGRESS NOTE   Diagnosis:  Pancreas cancer  INTERVAL HISTORY:   Sonya Larson returns as scheduled. She completed cycle 7 FOLFIRINOX 06/27/2015. No nausea or vomiting following the chemotherapy. She vomited yesterday. She continues to have diarrhea. She took Creon for 5 days with no change. The diarrhea improved with Imodium. She notes progressive fatigue. She has had 2 nosebleeds since she was here last. No other bleeding. She denies shortness of breath.  Objective:  Vital signs in last 24 hours:  Blood pressure 128/80, temperature 98.9 F (37.2 C), resp. rate 17, weight 147 lb (66.679 kg), last menstrual period 10/31/2005, SpO2 100 %.    HEENT: No thrush or ulcers. Resp: Lungs clear bilaterally. Cardio: Regular rate and rhythm. GI: Abdomen soft and nontender. No hepatomegaly. No mass. Vascular: No leg edema. The left lower leg is slightly larger than the right lower leg. Calves soft and nontender.  Skin: No rash. Port-A-Cath without erythema.    Lab Results:  Lab Results  Component Value Date   WBC 5.8 07/11/2015   HGB 7.3* 07/11/2015   HCT 22.5* 07/11/2015   MCV 105.8* 07/11/2015   PLT 72* 07/11/2015   NEUTROABS 4.7 07/11/2015    Imaging:  Dg Chest 2 View  07/10/2015   CLINICAL DATA:  Known history of pancreatic carcinoma, follow-up left-sided pleural effusion  EXAM: CHEST - 2 VIEW  COMPARISON:  05/16/2015  FINDINGS: Cardiac shadow is stable. The left chest wall port is again seen and stable. A small left pleural effusion is noted and stable in appearance. No new focal infiltrate or fusion is noted. No parenchymal nodules are seen. No bony abnormality is noted.  IMPRESSION: Stable small left pleural effusion.   Electronically Signed   By: Inez Catalina M.D.   On: 07/10/2015 13:30    Medications: I have reviewed the patient's current medications.  Assessment/Plan: 1. Adenocarcinoma the pancreas, uncinate mass, EUS April 2015 revealed a  uT3,uN1 lesion with an FNA biopsy confirming adenocarcinoma  MRI abdomen 07/04/2014 confirmed a pancreas uncinate 8 mass, 11 mm portacaval lymph node, no vascular involvement, and no evidence of distant metastatic disease   PET scan 07/20/2012 with a hypermetabolic uncinate process mass and hypermetabolic periportal lymph node   Markedly elevated CA 19-9   Negative diagnostic laparoscopy 07/24/2014   Initiation of Xeloda/radiation 08/10/2014.completed 09/21/2014.  CT 10/03/2014 confirmed a decrease in the pancreas mass, stable portacaval lymph node, and apparent involvement of the superior mesenteric artery  Pancreaticoduodenectomy on 11/02/2014 confirmed a poorly differentiated adenocarcinoma, ypT2,ypN0  Initiation of adjuvant gemcitabine 12/27/2014  Rising CA 19-07 February 2015  Left thoracentesis 03/08/2015 confirmed metastatic adenocarcinoma  CTs 03/16/2015 with a left pleural effusion and new right lung nodule  Cycle 1 FOLFIRINOX 03/21/2015  Cycle 2 FOLFIRINOX 04/04/2015  Cycle 3 FOLFIRINOX 04/18/2015  Cycle 4 FOLFIRINOX 05/02/2015  Cycle 5 FOLFIRINOX 05/16/2015 (oxaliplatin dose reduced secondary to thrombocytopenia)  Chemotherapy held 05/30/2015 due to thrombocytopenia and diarrhea.  Cycle 6 FOLFIRINOX 06/06/2015  Cycle 7 FOLFIRINOX 06/27/2015 (oxaliplatin and irinotecan dose reduced)  Chest x-ray 07/10/2015 with a stable small left pleural effusion. 2. Hypertension 3. Hyperlipidemia  4. Vaginal spotting summer 2015-evaluated by gynecology  5. Zoster rash at June 2015  6. Asthma  7. Family history of pancreas cancer 8. Nausea following the pancreaticoduodenectomy procedure. Improved. 9. Anemia secondary to chemotherapy; progressive anemia 06/12/2015 10. Admission with a fever 02/09/2015-no source for infection identified, treated for presumptive pneumonia 11. Left pleural effusion. Slightly larger on follow-up  chest x-ray 03/06/2015. Status post a  diagnostic/therapeutic thoracentesis 03/08/2015 cytology confirmed metastatic adenocarcinoma 12. Dysarthria/tongue numbness at the completion of oxaliplatin with cycle 1 FOLFIRINOX 03/21/2015-spontaneously resolved over hours, similar symptoms including diplopia following subsequent cycles of FOLFIRINOX 13. History of Hypokalemia-likely secondary to diarrhea, she is maintained on a potassium supplement 14. Thrombocytopenia secondary to chemotherapy-chemotherapy held 05/30/2015; marked thrombocytopenia (12,000) 06/12/2015 15. Epistaxis 06/12/2015 secondary to #14. Status post packing and follow up with ENT. Packing removed 06/18/2015.     Disposition: Sonya Larson appears stable. She has completed 7 cycles of FOLFIRINOX. She has progressive thrombocythemia on labs today. We will hold today's treatment and reschedule for one week. Subsequent treatments will be given on a 3 week schedule.  She has symptomatic anemia. She will receive 2 units of blood today.  She is hypokalemic on labs today. The hypokalemia is likely secondary to diarrhea. She will increase  potassium to 3 times a day. She will continue Imodium for the diarrhea.  We will see her in follow-up prior to treatment on 08/08/2015. She will contact the office in the interim with any problems. She understands to call the office with any bleeding.   Patient seen with Dr. Benay Spice. 25 minutes were spent face-to-face at today's visit with the majority of that time involved in counseling/coordination of care.   Ned Card ANP/GNP-BC   07/11/2015  11:33 AM This was a shared visit with Ned Card.  Chemotherapy will be held today and rescheduled for one week. She will be transfused with 2 units of packed red blood cells. She will be referred for a restaging CT evaluation within the next one month.  Julieanne Manson, M.D.

## 2015-07-12 LAB — TYPE AND SCREEN
ABO/RH(D): O POS
ANTIBODY SCREEN: NEGATIVE
Unit division: 0
Unit division: 0

## 2015-07-12 LAB — CANCER ANTIGEN 19-9: CA 19 9: 133.5 U/mL — AB (ref <35.0)

## 2015-07-13 ENCOUNTER — Telehealth: Payer: Self-pay | Admitting: *Deleted

## 2015-07-13 ENCOUNTER — Ambulatory Visit: Payer: Medicare Other

## 2015-07-13 ENCOUNTER — Telehealth: Payer: Self-pay | Admitting: Oncology

## 2015-07-13 NOTE — Telephone Encounter (Signed)
Appointments made and patient aware of her chemo and added flush

## 2015-07-13 NOTE — Telephone Encounter (Signed)
Per staff message and POF I have scheduled appts. Advised scheduler of appts. JMW  

## 2015-07-18 ENCOUNTER — Ambulatory Visit (HOSPITAL_BASED_OUTPATIENT_CLINIC_OR_DEPARTMENT_OTHER): Payer: Medicare Other

## 2015-07-18 ENCOUNTER — Ambulatory Visit: Payer: Medicare Other

## 2015-07-18 ENCOUNTER — Other Ambulatory Visit: Payer: Self-pay | Admitting: *Deleted

## 2015-07-18 ENCOUNTER — Other Ambulatory Visit (HOSPITAL_BASED_OUTPATIENT_CLINIC_OR_DEPARTMENT_OTHER): Payer: Medicare Other

## 2015-07-18 DIAGNOSIS — C257 Malignant neoplasm of other parts of pancreas: Secondary | ICD-10-CM | POA: Diagnosis present

## 2015-07-18 DIAGNOSIS — Z95828 Presence of other vascular implants and grafts: Secondary | ICD-10-CM

## 2015-07-18 DIAGNOSIS — Z452 Encounter for adjustment and management of vascular access device: Secondary | ICD-10-CM | POA: Diagnosis present

## 2015-07-18 DIAGNOSIS — C250 Malignant neoplasm of head of pancreas: Secondary | ICD-10-CM

## 2015-07-18 LAB — CBC WITH DIFFERENTIAL/PLATELET
BASO%: 0.4 % (ref 0.0–2.0)
Basophils Absolute: 0 10*3/uL (ref 0.0–0.1)
EOS ABS: 0.1 10*3/uL (ref 0.0–0.5)
EOS%: 1.1 % (ref 0.0–7.0)
HEMATOCRIT: 31.7 % — AB (ref 34.8–46.6)
HGB: 10 g/dL — ABNORMAL LOW (ref 11.6–15.9)
LYMPH#: 0.3 10*3/uL — AB (ref 0.9–3.3)
LYMPH%: 6.2 % — ABNORMAL LOW (ref 14.0–49.7)
MCH: 33.3 pg (ref 25.1–34.0)
MCHC: 31.5 g/dL (ref 31.5–36.0)
MCV: 105.7 fL — AB (ref 79.5–101.0)
MONO#: 0.5 10*3/uL (ref 0.1–0.9)
MONO%: 11.4 % (ref 0.0–14.0)
NEUT%: 80.9 % — ABNORMAL HIGH (ref 38.4–76.8)
NEUTROS ABS: 3.7 10*3/uL (ref 1.5–6.5)
PLATELETS: 145 10*3/uL (ref 145–400)
RBC: 3 10*6/uL — ABNORMAL LOW (ref 3.70–5.45)
RDW: 18.4 % — AB (ref 11.2–14.5)
WBC: 4.6 10*3/uL (ref 3.9–10.3)

## 2015-07-18 LAB — COMPREHENSIVE METABOLIC PANEL (CC13)
ALT: 49 U/L (ref 0–55)
ANION GAP: 5 meq/L (ref 3–11)
AST: 117 U/L — ABNORMAL HIGH (ref 5–34)
Albumin: 1.8 g/dL — ABNORMAL LOW (ref 3.5–5.0)
Alkaline Phosphatase: 388 U/L — ABNORMAL HIGH (ref 40–150)
BUN: 5.5 mg/dL — ABNORMAL LOW (ref 7.0–26.0)
CO2: 24 mEq/L (ref 22–29)
Calcium: 8 mg/dL — ABNORMAL LOW (ref 8.4–10.4)
Chloride: 112 mEq/L — ABNORMAL HIGH (ref 98–109)
Creatinine: 0.6 mg/dL (ref 0.6–1.1)
EGFR: >90 mL/min/{1.73_m2} (ref >90)
GLUCOSE: 103 mg/dL (ref 70–140)
Potassium: 3.7 mEq/L (ref 3.5–5.1)
SODIUM: 140 meq/L (ref 136–145)
TOTAL PROTEIN: 5.8 g/dL — AB (ref 6.4–8.3)
Total Bilirubin: 2.39 mg/dL — ABNORMAL HIGH (ref 0.20–1.20)

## 2015-07-18 MED ORDER — SODIUM CHLORIDE 0.9 % IJ SOLN
10.0000 mL | INTRAMUSCULAR | Status: DC | PRN
  Administered 2015-07-18: 10 mL via INTRAVENOUS
  Filled 2015-07-18: qty 10

## 2015-07-18 MED ORDER — HEPARIN SOD (PORK) LOCK FLUSH 100 UNIT/ML IV SOLN
500.0000 [IU] | Freq: Once | INTRAVENOUS | Status: AC
  Administered 2015-07-18: 500 [IU] via INTRAVENOUS
  Filled 2015-07-18: qty 5

## 2015-07-18 NOTE — Patient Instructions (Signed)

## 2015-07-18 NOTE — Progress Notes (Signed)
CMET reviewed by Dr. Benay Spice: Order received to hold chemo today, move CT scan up. Pt will need office visit prior to next treatment. Pt and husband requesting CA 19-9 results. Informed them tumor marker went up last week. Less than it was 1 month ago. They voiced understanding. Pt reports abdominal distention after meals, thinks she may have a "hernia" Denies abdominal pain. Reports 1-2 loose stools/ day with antidiarrheal medicine.  CT rescheduled for 8/23, pt to arrive at 0845 to drink water-based contrast. Will need sooner appointment with MD. Shanda Howells de-accessed, pt discharged to home.

## 2015-07-20 ENCOUNTER — Telehealth: Payer: Self-pay | Admitting: Oncology

## 2015-07-20 NOTE — Telephone Encounter (Signed)
Lft msg for pt confirming MD visit per 08/19 POF and per staff msg from MD.... This is to go over CT results... Mailed out schedule.Marland Kitchen KJ

## 2015-07-21 ENCOUNTER — Ambulatory Visit: Payer: Medicare Other

## 2015-07-24 ENCOUNTER — Ambulatory Visit (HOSPITAL_COMMUNITY)
Admission: RE | Admit: 2015-07-24 | Discharge: 2015-07-24 | Disposition: A | Discharge status: Home / Self Care | Payer: Medicare Other | Source: Ambulatory Visit | Attending: Nurse Practitioner | Admitting: Nurse Practitioner

## 2015-07-24 ENCOUNTER — Encounter (HOSPITAL_COMMUNITY): Payer: Self-pay

## 2015-07-24 DIAGNOSIS — Z79899 Other long term (current) drug therapy: Secondary | ICD-10-CM | POA: Insufficient documentation

## 2015-07-24 DIAGNOSIS — J90 Pleural effusion, not elsewhere classified: Secondary | ICD-10-CM | POA: Insufficient documentation

## 2015-07-24 DIAGNOSIS — K760 Fatty (change of) liver, not elsewhere classified: Secondary | ICD-10-CM | POA: Diagnosis not present

## 2015-07-24 DIAGNOSIS — R188 Other ascites: Secondary | ICD-10-CM | POA: Diagnosis not present

## 2015-07-24 DIAGNOSIS — I700 Atherosclerosis of aorta: Secondary | ICD-10-CM | POA: Insufficient documentation

## 2015-07-24 DIAGNOSIS — C250 Malignant neoplasm of head of pancreas: Secondary | ICD-10-CM | POA: Diagnosis present

## 2015-07-24 MED ORDER — IOHEXOL 300 MG/ML  SOLN
100.0000 mL | Freq: Once | INTRAMUSCULAR | Status: AC | PRN
  Administered 2015-07-24: 100 mL via INTRAVENOUS

## 2015-07-24 MED ORDER — IOHEXOL 300 MG/ML  SOLN: 50.0000 mL | Freq: Once | INTRAMUSCULAR | Status: DC | PRN

## 2015-07-24 MED ORDER — IOHEXOL 300 MG/ML  SOLN
50.0000 mL | Freq: Once | INTRAMUSCULAR | Status: AC | PRN
  Administered 2015-07-24: 50 mL via ORAL

## 2015-07-25 ENCOUNTER — Telehealth: Payer: Self-pay | Admitting: Oncology

## 2015-07-25 ENCOUNTER — Ambulatory Visit (HOSPITAL_BASED_OUTPATIENT_CLINIC_OR_DEPARTMENT_OTHER): Payer: Medicare Other | Admitting: Oncology

## 2015-07-25 ENCOUNTER — Other Ambulatory Visit: Payer: Self-pay | Admitting: *Deleted

## 2015-07-25 VITALS — BP 150/87 | HR 87 | Temp 98.2°F | Resp 18 | Ht 63.5 in | Wt 154.0 lb

## 2015-07-25 DIAGNOSIS — C250 Malignant neoplasm of head of pancreas: Secondary | ICD-10-CM

## 2015-07-25 DIAGNOSIS — D6481 Anemia due to antineoplastic chemotherapy: Secondary | ICD-10-CM | POA: Diagnosis not present

## 2015-07-25 DIAGNOSIS — R188 Other ascites: Secondary | ICD-10-CM

## 2015-07-25 DIAGNOSIS — E8809 Other disorders of plasma-protein metabolism, not elsewhere classified: Secondary | ICD-10-CM

## 2015-07-25 DIAGNOSIS — C257 Malignant neoplasm of other parts of pancreas: Secondary | ICD-10-CM | POA: Diagnosis present

## 2015-07-25 DIAGNOSIS — I10 Essential (primary) hypertension: Secondary | ICD-10-CM

## 2015-07-25 DIAGNOSIS — D696 Thrombocytopenia, unspecified: Secondary | ICD-10-CM

## 2015-07-25 DIAGNOSIS — I2699 Other pulmonary embolism without acute cor pulmonale: Secondary | ICD-10-CM

## 2015-07-25 MED ORDER — RIVAROXABAN (XARELTO) VTE STARTER PACK (15 & 20 MG): ORAL_TABLET | ORAL | Status: DC

## 2015-07-25 NOTE — Telephone Encounter (Signed)
Pt confirmed labs/ov next visit which was already schedule w/chemo, gave pt AVS and Calendar.... KJ

## 2015-07-25 NOTE — Progress Notes (Signed)
Sonya Larson   Diagnosis: Pancreas cancer  INTERVAL HISTORY:   Sonya Larson returns as scheduled. Chemotherapy was held 2 weeks ago secondary to liver enzyme abnormalities. She reports feeling well. Good appetite. No leg swelling. She has noted increased abdominal distention for the past few weeks.  Objective:  Vital signs in last 24 hours:  Blood pressure 150/87, pulse 87, temperature 98.2 F (36.8 C), temperature source Oral, resp. rate 18, height 5' 3.5" (1.613 m), weight 154 lb (69.854 kg), last menstrual period 10/31/2005, SpO2 99 %.    HEENT: No thrush or ulcers Resp: Lungs clear bilaterally Cardio: Regular rate and rhythm GI: The abdomen is distended with ascites. No mass, nontender, no hepatomegaly Vascular: No leg edema   Portacath/PICC-without erythema  Lab Results:  Lab Results  Component Value Date   WBC 4.6 07/18/2015   HGB 10.0* 07/18/2015   HCT 31.7* 07/18/2015   MCV 105.7* 07/18/2015   PLT 145 07/18/2015   NEUTROABS 3.7 07/18/2015   07/11/2015: CA 19-9-133.5   Imaging:  Ct Chest W Contrast  07/24/2015   CLINICAL DATA:  Pancreatic cancer diagnosed July 2015. Chemotherapy and progress. 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  EXAM: CT CHEST, ABDOMEN, AND PELVIS WITH CONTRAST  TECHNIQUE: Multidetector CT imaging of the chest, abdomen and pelvis was performed following the standard protocol during bolus administration of intravenous contrast.  CONTRAST:  50mL OMNIPAQUE IOHEXOL 300 MG/ML SOLN, 169mL OMNIPAQUE IOHEXOL 300 MG/ML SOLN  COMPARISON:  03/15/2015  FINDINGS: CT CHEST FINDINGS  Chest wall: No chest wall mass, breast mass, supraclavicular or axillary lymphadenopathy. The thyroid gland is grossly normal. The bony thorax is intact. No destructive bone lesions or spinal canal compromise. Moderate degenerative changes involving the spine.  Mediastinum: The heart is normal in size no pericardial effusion. The aorta and  branch vessels are patent. No aneurysm or dissection. The power port is stable. The esophagus is grossly normal. There is a filling defect and a right lower lobe pulmonary artery consistent with small pulmonary embolism.  Lungs/pleura: Slightly smaller left pleural effusion and overlying atelectasis/ scar. No enhancing pleural nodules are identified. The right lower lobe pulmonary nodule has resolved. The left apical ground-glass opacity has resolved. No new pulmonary lesions or acute pulmonary findings.  CT ABDOMEN AND PELVIS FINDINGS  Hepatobiliary: Severe and diffuse fatty infiltration of the liver even more pronounced in the prior studies. There are areas of slight increased attenuation which appear new. I think these are probably areas of slight fatty sparing made more obvious by the progressive fatty change around them. I do not see any discrete hepatic mass lesions. The portal and hepatic veins are patent.  Pancreas: Surgical changes from a Whipple procedure. The pancreatic body and tail appear normal and stable. Stable ill-defined soft tissue density between the left renal vein and the SMA. This could be postoperative scarring change. There are few small scattered peripancreatic lymph nodes which are stable. I do not see any findings to suggest tumor recurrence.  Spleen: Normal size.  No focal lesions.  Adrenals/Urinary Tract: The adrenal glands and kidneys are unremarkable and stable.  Stomach/Bowel: The stomach, duodenum, small bowel and colon all demonstrate mild wall thickening but this is likely due to ascites. Fibrofatty infiltrated type changes involving the colon likely due to chronic or remote inflammatory bowel disease. No mass lesions or obstructive findings.  Vascular/Lymphatic: The aorta demonstrates stable atherosclerotic calcifications. No focal aneurysm or dissection. The branch vessels are patent. The major  venous structures are patent. No pathologically enlarged mesenteric or  retroperitoneal lymph nodes. Small scattered nodes are stable.  Other: New large volume abdominal/ pelvic ascites is somewhat worrisome. I do not see any obvious omental caking or peritoneal implants but subtle peritoneal disease would be a concern. Paracentesis may be helpful for diagnostic and therapeutic purposes. The uterus and ovaries are normal. The bladder is normal.  Musculoskeletal: No significant bony findings. Moderate degenerative changes involving the lower lumbar facet joints.  IMPRESSION: 1. Interval decrease in size of the left pleural effusion and no obvious persistent enhancing pleural nodules. Persistent overlying left lower lobe atelectasis and scarring. 2. Resolution of pulmonary nodules demonstrated on the prior study. No new pulmonary lesions. No mediastinal or hilar mass or adenopathy. 3. New large volume abdominal/pelvic ascites without obvious omental or peritoneal metastatic disease. Subtle disease is possible and paracentesis may be helpful for diagnostic and therapeutic purposes. There is also diffuse subcutaneous body wall edema suggesting anasarca. 4. Diffuse bowel wall thickening likely due to surrounding ascites. Suspect chronic inflammatory bowel disease involving the colon with fibrofatty infiltrative changes. 5. Stable surgical changes from Whipple procedure. No complicating features are demonstrated. Stable ill-defined soft tissue density between the left renal vein and the SMA is likely postoperative change. 6. Severe and diffuse fatty infiltration of the liver appears slightly progressive with areas of probable focal fatty sparing. No definite hepatic metastatic lesions.   Electronically Signed   By: Sonya Larson M.D.   On: 07/24/2015 10:48   Ct Abdomen Pelvis W Contrast  07/24/2015   CLINICAL DATA:  Pancreatic cancer diagnosed July 2015. Chemotherapy and progress. 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  EXAM: CT CHEST, ABDOMEN, AND PELVIS WITH CONTRAST  TECHNIQUE:  Multidetector CT imaging of the chest, abdomen and pelvis was performed following the standard protocol during bolus administration of intravenous contrast.  CONTRAST:  16mL OMNIPAQUE IOHEXOL 300 MG/ML SOLN, 132mL OMNIPAQUE IOHEXOL 300 MG/ML SOLN  COMPARISON:  03/15/2015  FINDINGS: CT CHEST FINDINGS  Chest wall: No chest wall mass, breast mass, supraclavicular or axillary lymphadenopathy. The thyroid gland is grossly normal. The bony thorax is intact. No destructive bone lesions or spinal canal compromise. Moderate degenerative changes involving the spine.  Mediastinum: The heart is normal in size no pericardial effusion. The aorta and branch vessels are patent. No aneurysm or dissection. The power port is stable. The esophagus is grossly normal. There is a filling defect and a right lower lobe pulmonary artery consistent with small pulmonary embolism.  Lungs/pleura: Slightly smaller left pleural effusion and overlying atelectasis/ scar. No enhancing pleural nodules are identified. The right lower lobe pulmonary nodule has resolved. The left apical ground-glass opacity has resolved. No new pulmonary lesions or acute pulmonary findings.  CT ABDOMEN AND PELVIS FINDINGS  Hepatobiliary: Severe and diffuse fatty infiltration of the liver even more pronounced in the prior studies. There are areas of slight increased attenuation which appear new. I think these are probably areas of slight fatty sparing made more obvious by the progressive fatty change around them. I do not see any discrete hepatic mass lesions. The portal and hepatic veins are patent.  Pancreas: Surgical changes from a Whipple procedure. The pancreatic body and tail appear normal and stable. Stable ill-defined soft tissue density between the left renal vein and the SMA. This could be postoperative scarring change. There are few small scattered peripancreatic lymph nodes which are stable. I do not see any findings to suggest tumor recurrence.  Spleen:  Normal  size.  No focal lesions.  Adrenals/Urinary Tract: The adrenal glands and kidneys are unremarkable and stable.  Stomach/Bowel: The stomach, duodenum, small bowel and colon all demonstrate mild wall thickening but this is likely due to ascites. Fibrofatty infiltrated type changes involving the colon likely due to chronic or remote inflammatory bowel disease. No mass lesions or obstructive findings.  Vascular/Lymphatic: The aorta demonstrates stable atherosclerotic calcifications. No focal aneurysm or dissection. The branch vessels are patent. The major venous structures are patent. No pathologically enlarged mesenteric or retroperitoneal lymph nodes. Small scattered nodes are stable.  Other: New large volume abdominal/ pelvic ascites is somewhat worrisome. I do not see any obvious omental caking or peritoneal implants but subtle peritoneal disease would be a concern. Paracentesis may be helpful for diagnostic and therapeutic purposes. The uterus and ovaries are normal. The bladder is normal.  Musculoskeletal: No significant bony findings. Moderate degenerative changes involving the lower lumbar facet joints.  IMPRESSION: 1. Interval decrease in size of the left pleural effusion and no obvious persistent enhancing pleural nodules. Persistent overlying left lower lobe atelectasis and scarring. 2. Resolution of pulmonary nodules demonstrated on the prior study. No new pulmonary lesions. No mediastinal or hilar mass or adenopathy. 3. New large volume abdominal/pelvic ascites without obvious omental or peritoneal metastatic disease. Subtle disease is possible and paracentesis may be helpful for diagnostic and therapeutic purposes. There is also diffuse subcutaneous body wall edema suggesting anasarca. 4. Diffuse bowel wall thickening likely due to surrounding ascites. Suspect chronic inflammatory bowel disease involving the colon with fibrofatty infiltrative changes. 5. Stable surgical changes from Whipple  procedure. No complicating features are demonstrated. Stable ill-defined soft tissue density between the left renal vein and the SMA is likely postoperative change. 6. Severe and diffuse fatty infiltration of the liver appears slightly progressive with areas of probable focal fatty sparing. No definite hepatic metastatic lesions.   Electronically Signed   By: Sonya Larson M.D.   On: 07/24/2015 10:48    Medications: I have reviewed the patient's current medications.  Assessment/Plan: 1. Adenocarcinoma the pancreas, uncinate mass, EUS April 2015 revealed a uT3,uN1 lesion with an FNA biopsy confirming adenocarcinoma  MRI abdomen 07/04/2014 confirmed a pancreas uncinate 8 mass, 11 mm portacaval lymph node, no vascular involvement, and no evidence of distant metastatic disease   PET scan 07/20/2012 with a hypermetabolic uncinate process mass and hypermetabolic periportal lymph node   Markedly elevated CA 19-9   Negative diagnostic laparoscopy 07/24/2014   Initiation of Xeloda/radiation 08/10/2014.completed 09/21/2014.  CT 10/03/2014 confirmed a decrease in the pancreas mass, stable portacaval lymph node, and apparent involvement of the superior mesenteric artery  Pancreaticoduodenectomy on 11/02/2014 confirmed a poorly differentiated adenocarcinoma, ypT2,ypN0  Initiation of adjuvant gemcitabine 12/27/2014  Rising CA 19-07 February 2015  Left thoracentesis 03/08/2015 confirmed metastatic adenocarcinoma  CTs 03/16/2015 with a left pleural effusion and new right lung nodule  Cycle 1 FOLFIRINOX 03/21/2015  Cycle 2 FOLFIRINOX 04/04/2015  Cycle 3 FOLFIRINOX 04/18/2015  Cycle 4 FOLFIRINOX 05/02/2015  Cycle 5 FOLFIRINOX 05/16/2015 (oxaliplatin dose reduced secondary to thrombocytopenia)  Chemotherapy held 05/30/2015 due to thrombocytopenia and diarrhea.  Cycle 6 FOLFIRINOX 06/06/2015  Cycle 7 FOLFIRINOX 06/27/2015 (oxaliplatin and irinotecan dose reduced)  Chest x-ray 07/10/2015  with a stable small left pleural effusion.  CT 07/24/2015 with a decreased left pleural effusion and resolution of pulmonary nodules, severe fatty infiltration of the liver, new large volume ascites, and a right lower lung pulmonary embolism 2. Hypertension 3. Hyperlipidemia  4. Vaginal spotting  summer 2015-evaluated by gynecology  5. Zoster rash at June 2015  6. Asthma  7. Family history of pancreas cancer 8. Nausea following the pancreaticoduodenectomy procedure. Improved. 9. Anemia secondary to chemotherapy; progressive anemia 06/12/2015 10. Admission with a fever 02/09/2015-no source for infection identified, treated for presumptive pneumonia 11. Left pleural effusion. Slightly larger on follow-up chest x-ray 03/06/2015. Status post a diagnostic/therapeutic thoracentesis 03/08/2015 cytology confirmed metastatic adenocarcinoma 12. Dysarthria/tongue numbness at the completion of oxaliplatin with cycle 1 FOLFIRINOX 03/21/2015-spontaneously resolved over hours, similar symptoms including diplopia following subsequent cycles of FOLFIRINOX 13. History of Hypokalemia-likely secondary to diarrhea, she is maintained on a potassium supplement 14. Thrombocytopenia secondary to chemotherapy-chemotherapy held 05/30/2015; marked thrombocytopenia (12,000) 06/12/2015 15. Epistaxis 06/12/2015 secondary to #14. Status post packing and follow up with ENT. Packing removed 06/18/2015. 16. Right lower lobe pulmonary embolism noted on the CT 07/24/2015- xarelto started 07/25/2015   Disposition:  Ms. Arocho appears well. She has developed ascites. This may be related to liver dysfunction and hypoalbuminemia versus carcinomatosis. She will be referred for a diagnostic/therapeutic paracentesis. Chemotherapy remains on hold. She will return for an office visit as scheduled on 08/08/2015. The plan is to begin a trial of gemcitabine/Abraxane if the paracentesis confirms malignant ascites and the liver enzymes  improve.  Betsy Coder, MD  07/25/2015  1:02 PM

## 2015-07-26 ENCOUNTER — Ambulatory Visit (HOSPITAL_COMMUNITY)
Admission: RE | Admit: 2015-07-26 | Discharge: 2015-07-26 | Disposition: A | Discharge status: Home / Self Care | Payer: Medicare Other | Source: Ambulatory Visit | Attending: Oncology | Admitting: Oncology

## 2015-07-26 DIAGNOSIS — C250 Malignant neoplasm of head of pancreas: Secondary | ICD-10-CM | POA: Insufficient documentation

## 2015-07-26 DIAGNOSIS — R188 Other ascites: Secondary | ICD-10-CM | POA: Insufficient documentation

## 2015-07-26 LAB — BODY FLUID CELL COUNT WITH DIFFERENTIAL
EOS FL: 0 %
Lymphs, Fluid: 17 %
MONOCYTE-MACROPHAGE-SEROUS FLUID: 81 % (ref 50–90)
NEUTROPHIL FLUID: 2 % (ref 0–25)
Total Nucleated Cell Count, Fluid: 100 cu mm (ref 0–1000)

## 2015-07-26 LAB — PROTEIN, BODY FLUID: Total protein, fluid: <3 g/dL

## 2015-07-26 NOTE — Procedures (Signed)
US guided diagnostic/therapeutic paracentesis performed yielding 2.1 liters clear, yellow fluid. A portion of the fluid was sent to the lab for preordered studies. No immediate complications.

## 2015-08-01 ENCOUNTER — Ambulatory Visit (HOSPITAL_COMMUNITY): Payer: Medicare Other

## 2015-08-01 ENCOUNTER — Telehealth: Payer: Self-pay | Admitting: *Deleted

## 2015-08-01 NOTE — Telephone Encounter (Signed)
Several messages from pt's husband requesting cytology results. Dr. Benay Spice called, discussed results with pt and husband. Cytology is positive. Plan is for a treatment break to allow her liver enzymes to recover from chemo.

## 2015-08-07 ENCOUNTER — Other Ambulatory Visit: Payer: Self-pay | Admitting: Oncology

## 2015-08-08 ENCOUNTER — Telehealth: Payer: Self-pay | Admitting: Oncology

## 2015-08-08 ENCOUNTER — Ambulatory Visit (HOSPITAL_BASED_OUTPATIENT_CLINIC_OR_DEPARTMENT_OTHER): Payer: Medicare Other | Admitting: Oncology

## 2015-08-08 ENCOUNTER — Ambulatory Visit (HOSPITAL_COMMUNITY)
Admission: RE | Admit: 2015-08-08 | Discharge: 2015-08-08 | Disposition: A | Discharge status: Home / Self Care | Payer: Medicare Other | Source: Ambulatory Visit | Attending: Oncology | Admitting: Oncology

## 2015-08-08 ENCOUNTER — Other Ambulatory Visit: Payer: Medicare Other

## 2015-08-08 ENCOUNTER — Ambulatory Visit: Payer: Medicare Other

## 2015-08-08 ENCOUNTER — Other Ambulatory Visit (HOSPITAL_BASED_OUTPATIENT_CLINIC_OR_DEPARTMENT_OTHER): Payer: Medicare Other

## 2015-08-08 ENCOUNTER — Encounter: Payer: Self-pay | Admitting: *Deleted

## 2015-08-08 VITALS — BP 137/81 | HR 100 | Temp 98.0°F | Resp 18 | Ht 63.5 in | Wt 152.9 lb

## 2015-08-08 DIAGNOSIS — R188 Other ascites: Secondary | ICD-10-CM | POA: Diagnosis not present

## 2015-08-08 DIAGNOSIS — R918 Other nonspecific abnormal finding of lung field: Secondary | ICD-10-CM | POA: Insufficient documentation

## 2015-08-08 DIAGNOSIS — C257 Malignant neoplasm of other parts of pancreas: Secondary | ICD-10-CM

## 2015-08-08 DIAGNOSIS — C250 Malignant neoplasm of head of pancreas: Secondary | ICD-10-CM

## 2015-08-08 DIAGNOSIS — R17 Unspecified jaundice: Secondary | ICD-10-CM | POA: Insufficient documentation

## 2015-08-08 DIAGNOSIS — J90 Pleural effusion, not elsewhere classified: Secondary | ICD-10-CM | POA: Insufficient documentation

## 2015-08-08 LAB — COMPREHENSIVE METABOLIC PANEL (CC13)
ALBUMIN: 1.7 g/dL — AB (ref 3.5–5.0)
ALK PHOS: 475 U/L — AB (ref 40–150)
ALT: 45 U/L (ref 0–55)
ANION GAP: 6 meq/L (ref 3–11)
AST: 129 U/L — ABNORMAL HIGH (ref 5–34)
BILIRUBIN TOTAL: 6.01 mg/dL — AB (ref 0.20–1.20)
BUN: 6.9 mg/dL — ABNORMAL LOW (ref 7.0–26.0)
CO2: 25 mEq/L (ref 22–29)
CREATININE: 0.7 mg/dL (ref 0.6–1.1)
Calcium: 8.4 mg/dL (ref 8.4–10.4)
Chloride: 111 mEq/L — ABNORMAL HIGH (ref 98–109)
GLUCOSE: 92 mg/dL (ref 70–140)
Potassium: 4.5 mEq/L (ref 3.5–5.1)
Sodium: 141 mEq/L (ref 136–145)
TOTAL PROTEIN: 6.8 g/dL (ref 6.4–8.3)

## 2015-08-08 LAB — CBC WITH DIFFERENTIAL/PLATELET
BASO%: 0.4 % (ref 0.0–2.0)
Basophils Absolute: 0 10*3/uL (ref 0.0–0.1)
EOS ABS: 0.1 10*3/uL (ref 0.0–0.5)
EOS%: 2.9 % (ref 0.0–7.0)
HCT: 32.7 % — ABNORMAL LOW (ref 34.8–46.6)
HEMOGLOBIN: 10.3 g/dL — AB (ref 11.6–15.9)
LYMPH#: 0.5 10*3/uL — AB (ref 0.9–3.3)
LYMPH%: 10 % — ABNORMAL LOW (ref 14.0–49.7)
MCH: 34.2 pg — ABNORMAL HIGH (ref 25.1–34.0)
MCHC: 31.5 g/dL (ref 31.5–36.0)
MCV: 108.6 fL — AB (ref 79.5–101.0)
MONO#: 0.3 10*3/uL (ref 0.1–0.9)
MONO%: 5.6 % (ref 0.0–14.0)
NEUT%: 81.1 % — ABNORMAL HIGH (ref 38.4–76.8)
NEUTROS ABS: 3.9 10*3/uL (ref 1.5–6.5)
PLATELETS: 160 10*3/uL (ref 145–400)
RBC: 3.01 10*6/uL — ABNORMAL LOW (ref 3.70–5.45)
RDW: 17.5 % — AB (ref 11.2–14.5)
WBC: 4.8 10*3/uL (ref 3.9–10.3)

## 2015-08-08 MED ORDER — IOHEXOL 300 MG/ML  SOLN
50.0000 mL | Freq: Once | INTRAMUSCULAR | Status: AC | PRN
  Administered 2015-08-08: 50 mL via ORAL

## 2015-08-08 MED ORDER — POTASSIUM CHLORIDE CRYS ER 10 MEQ PO TBCR: 10.0000 meq | EXTENDED_RELEASE_TABLET | Freq: Two times a day (BID) | ORAL | Status: DC

## 2015-08-08 MED ORDER — RIVAROXABAN 20 MG PO TABS: 20.0000 mg | ORAL_TABLET | Freq: Every day | ORAL | Status: DC

## 2015-08-08 MED ORDER — IOHEXOL 300 MG/ML  SOLN
100.0000 mL | Freq: Once | INTRAMUSCULAR | Status: AC | PRN
  Administered 2015-08-08: 100 mL via INTRAVENOUS

## 2015-08-08 MED ORDER — SODIUM CHLORIDE 0.9 % IJ SOLN
10.0000 mL | INTRAMUSCULAR | Status: AC | PRN
  Filled 2015-08-08: qty 10

## 2015-08-08 NOTE — Progress Notes (Signed)
[place] OFFICE PROGRESS NOTE   Diagnosis: Pancreas cancer  INTERVAL HISTORY:   Ms. Jhaveri returns as scheduled. She underwent a paracentesis for 2.1 L of fluid on 07/26/2015. The cytology returned positive for metastatic adenocarcinoma. She reports feeling well. The abdomen remained distended after the paracentesis procedure and the swelling has progressed. No pain. No dyspnea. She has noted increased hemorrhoid bleeding since starting Xarelto, no other bleeding.  Objective:  Vital signs in last 24 hours:  Blood pressure 137/81, pulse 100, temperature 98 F (36.7 C), temperature source Oral, resp. rate 18, height 5' 3.5" (1.613 m), weight 152 lb 14.4 oz (69.355 kg), last menstrual period 10/31/2005, SpO2 100 %.    HEENT: Scleral and subungual icterus Resp: Lungs clear bilaterally Cardio: Regular rate and rhythm GI: No hepatomegaly, mild tenderness in the right upper quadrant, the abdomen is distended with ascites Vascular: No leg edema   Portacath/PICC-without erythema  Lab Results:  Lab Results  Component Value Date   WBC 4.8 08/08/2015   HGB 10.3* 08/08/2015   HCT 32.7* 08/08/2015   MCV 108.6* 08/08/2015   PLT 160 08/08/2015   NEUTROABS 3.9 08/08/2015   Creatinine 0.7, alk phosphatase 475, bilirubin 6.01    Medications: I have reviewed the patient's current medications.  Assessment/Plan: 1. Adenocarcinoma the pancreas, uncinate mass, EUS April 2015 revealed a uT3,uN1 lesion with an FNA biopsy confirming adenocarcinoma  MRI abdomen 07/04/2014 confirmed a pancreas uncinate 8 mass, 11 mm portacaval lymph node, no vascular involvement, and no evidence of distant metastatic disease   PET scan 07/20/2012 with a hypermetabolic uncinate process mass and hypermetabolic periportal lymph node   Markedly elevated CA 19-9   Negative diagnostic laparoscopy 07/24/2014   Initiation of Xeloda/radiation 08/10/2014.completed 09/21/2014.  CT 10/03/2014  confirmed a decrease in the pancreas mass, stable portacaval lymph node, and apparent involvement of the superior mesenteric artery  Pancreaticoduodenectomy on 11/02/2014 confirmed a poorly differentiated adenocarcinoma, ypT2,ypN0  Initiation of adjuvant gemcitabine 12/27/2014  Rising CA 19-07 February 2015  Left thoracentesis 03/08/2015 confirmed metastatic adenocarcinoma  CTs 03/16/2015 with a left pleural effusion and new right lung nodule  Cycle 1 FOLFIRINOX 03/21/2015  Cycle 2 FOLFIRINOX 04/04/2015  Cycle 3 FOLFIRINOX 04/18/2015  Cycle 4 FOLFIRINOX 05/02/2015  Cycle 5 FOLFIRINOX 05/16/2015 (oxaliplatin dose reduced secondary to thrombocytopenia)  Chemotherapy held 05/30/2015 due to thrombocytopenia and diarrhea.  Cycle 6 FOLFIRINOX 06/06/2015  Cycle 7 FOLFIRINOX 06/27/2015 (oxaliplatin and irinotecan dose reduced)  Chest x-ray 07/10/2015 with a stable small left pleural effusion.  CT 07/24/2015 with a decreased left pleural effusion and resolution of pulmonary nodules, severe fatty infiltration of the liver, new large volume ascites, and a right lower lung pulmonary embolism  Paracentesis 07/26/2015 confirmed metastatic adenocarcinoma 2. Hypertension 3. Hyperlipidemia  4. Vaginal spotting summer 2015-evaluated by gynecology  5. Zoster rash at June 2015  6. Asthma  7. Family history of pancreas cancer 8. Nausea following the pancreaticoduodenectomy procedure. Improved. 9. Anemia secondary to chemotherapy; progressive anemia 06/12/2015 10. Admission with a fever 02/09/2015-no source for infection identified, treated for presumptive pneumonia 11. Left pleural effusion. Slightly larger on follow-up chest x-ray 03/06/2015. Status post a diagnostic/therapeutic thoracentesis 03/08/2015 cytology confirmed metastatic adenocarcinoma 12. Dysarthria/tongue numbness at the completion of oxaliplatin with cycle 1 FOLFIRINOX 03/21/2015-spontaneously resolved over hours, similar  symptoms including diplopia following subsequent cycles of FOLFIRINOX 13. History of Hypokalemia-likely secondary to diarrhea, she is maintained on a potassium supplement 14. Thrombocytopenia secondary to chemotherapy-chemotherapy held 05/30/2015; marked thrombocytopenia (12,000) 06/12/2015 15. Epistaxis  06/12/2015 secondary to #14. Status post packing and follow up with ENT. Packing removed 06/18/2015. 16. Right lower lobe pulmonary embolism noted on the CT 07/24/2015- xarelto started 07/25/2015 17. Jaundice-progressive over the past month, no biliary dilatation seen on the eighth 23 2016 CT     Disposition:  Ms. Elwell has metastatic pancreas cancer. I discussed the cytology findings from the paracentesis with her by telephone and again today. We reviewed treatment options. My recommendation is to begin gemcitabine/Abraxane if the hyperbilirubinemia improves.  She will be referred for a repeat CT to look for evidence of bile biliary obstruction. We will refer her to interventional radiology for placement of a biliary drain as indicated.  Ms. Jolley will be referred to Dr. Reynaldo Minium at [facility] for a second opinion regarding treatment options and clinical trial eligibility.  She will return for an office visit here in 2 weeks.  Betsy Coder, MD  08/08/2015  9:26 AM

## 2015-08-08 NOTE — Progress Notes (Unsigned)
Pt had labs peripherally today, no flush appt needed.

## 2015-08-08 NOTE — Progress Notes (Signed)
Oncology Nurse Navigator Documentation  Oncology Nurse Navigator Flowsheets 08/08/2015  Navigator Encounter Type 3 month  Patient Visit Type Medonc  Treatment Phase Abnormal labs--progression  Barriers/Navigation Needs Family concerns  Interventions Coordination of Care  Coordination of Care Radiology--  Support Groups/Services -  Time Spent with Patient 27  Spoke radiology and arranged for paracentesis and CT scan to be done today at [facility] be NPO now. Arrive at 1:00 (para 1st). Will be provided contrast in radiology. Spoke with Ebony in managed care: she does not require PA for scan. Referral for Dr. Reynaldo Minium at [facility] forwarded to [facility] in HIM.

## 2015-08-08 NOTE — Procedures (Signed)
Successful US guided paracentesis from LUQ.  Yielded 3 liters of serous fluid.  No immediate complications.  Pt tolerated well.   Specimen was not sent for labs.  Tsosie Billing D PA-C 08/08/2015 2:50 PM

## 2015-08-08 NOTE — Telephone Encounter (Signed)
lvm for pt regarding to Sept appt.... °

## 2015-08-09 ENCOUNTER — Other Ambulatory Visit: Payer: Self-pay | Admitting: *Deleted

## 2015-08-09 ENCOUNTER — Telehealth: Payer: Self-pay | Admitting: *Deleted

## 2015-08-09 ENCOUNTER — Ambulatory Visit (HOSPITAL_COMMUNITY): Payer: Medicare Other

## 2015-08-09 DIAGNOSIS — C250 Malignant neoplasm of head of pancreas: Secondary | ICD-10-CM

## 2015-08-09 LAB — CANCER ANTIGEN 19-9: CA 19-9: 452.9 U/mL — ABNORMAL HIGH (ref <35.0)

## 2015-08-09 MED ORDER — POTASSIUM CHLORIDE CRYS ER 10 MEQ PO TBCR: EXTENDED_RELEASE_TABLET | ORAL | Status: DC

## 2015-08-09 NOTE — Telephone Encounter (Signed)
THE CT SCAN REPORT WAS CALLED AND FAXED. THIS REPORT WAS GIVEN TO DR.SHERRILL'S NURSE, ALEXIS ROBINSON,RN.

## 2015-08-09 NOTE — Telephone Encounter (Signed)
Per Dr. Benay Spice, pt needs to see Dr. Paulita Fujita for increased bilirubin and PE in right lobe. Office able to get pt in 9/15 at 1130, pt to be there at 1115. Appt information given to pt, she voices understanding and appreciated call.  Message to [employer] in HIM to have labs, CT result, and recent office visit with Dr. Benay Spice can be faxed over immediately.

## 2015-08-14 ENCOUNTER — Encounter: Payer: Self-pay | Admitting: *Deleted

## 2015-08-14 ENCOUNTER — Other Ambulatory Visit: Payer: Self-pay | Admitting: Nurse Practitioner

## 2015-08-21 ENCOUNTER — Other Ambulatory Visit: Payer: Self-pay | Admitting: *Deleted

## 2015-08-21 DIAGNOSIS — C250 Malignant neoplasm of head of pancreas: Secondary | ICD-10-CM

## 2015-08-21 NOTE — Progress Notes (Signed)
Received message from husband stating pt is uncomfortable and requesting abd paracentesis. Per Dr. Benay Spice, okay to set this up for today. Central Scheduling to contact pt with time.

## 2015-08-22 ENCOUNTER — Ambulatory Visit (HOSPITAL_COMMUNITY)
Admission: RE | Admit: 2015-08-22 | Discharge: 2015-08-22 | Disposition: A | Discharge status: Home / Self Care | Payer: Medicare Other | Source: Ambulatory Visit | Attending: Oncology | Admitting: Oncology

## 2015-08-22 DIAGNOSIS — C250 Malignant neoplasm of head of pancreas: Secondary | ICD-10-CM

## 2015-08-22 DIAGNOSIS — R188 Other ascites: Secondary | ICD-10-CM | POA: Diagnosis not present

## 2015-08-22 NOTE — Procedures (Signed)
   US guided RLQ para 2.7 liters yellow fluid  Tolerated well

## 2015-08-30 ENCOUNTER — Other Ambulatory Visit: Payer: Self-pay | Admitting: *Deleted

## 2015-08-30 ENCOUNTER — Ambulatory Visit (HOSPITAL_BASED_OUTPATIENT_CLINIC_OR_DEPARTMENT_OTHER): Payer: Medicare Other | Admitting: Oncology

## 2015-08-30 ENCOUNTER — Telehealth: Payer: Self-pay | Admitting: *Deleted

## 2015-08-30 ENCOUNTER — Telehealth: Payer: Self-pay | Admitting: Oncology

## 2015-08-30 ENCOUNTER — Encounter: Payer: Self-pay | Admitting: *Deleted

## 2015-08-30 VITALS — BP 135/81 | HR 90 | Temp 98.5°F | Resp 18 | Ht 63.5 in | Wt 156.8 lb

## 2015-08-30 DIAGNOSIS — C250 Malignant neoplasm of head of pancreas: Secondary | ICD-10-CM

## 2015-08-30 NOTE — Telephone Encounter (Signed)
Gave and pritned appt sched and avs fo rpt for Sept adn OCT

## 2015-08-30 NOTE — Progress Notes (Signed)
[facility] OFFICE PROGRESS NOTE   Diagnosis: Pancreas cancer  INTERVAL HISTORY:   [facility] Larson returns for scheduled visit. She underwent a therapeutic paracentesis for 2.7 L on 08/22/2015.  She saw Dr. Paulita Fujita to evaluate the hyperbilirubinemia. There is no mechanical obstruction to explain the elevated bilirubin and no apparent etiology. I discussed the case with Dr. Paulita Fujita and he suspects infiltrative tumor in the liver causing the hyperbilirubinemia.  Sonya Larson saw Dr. Berneice Gandy for a second opinion on 08/14/2015. He recommends palliative care.  The abdominal distention has returned over the past few days. She would like to have another paracentesis.  Objective:  Vital signs in last 24 hours:  Blood pressure 135/81, pulse 90, temperature 98.5 F (36.9 C), temperature source Oral, resp. rate 18, height 5' 3.5" (1.613 m), weight 156 lb 12.8 oz (71.124 kg), last menstrual period 10/31/2005, SpO2 100 %.    HEENT: Scleral and subungual icterus Resp: Lungs clear bilaterally Cardio: Regular rate and rhythm GI: The abdomen is markedly distended, nontender Vascular: Trace edema at the lower leg and ankle bilaterally  Skin: Jaundice   Portacath/PICC-without erythema  Lab Results:  Lab Results  Component Value Date   WBC 4.8 08/08/2015   HGB 10.3* 08/08/2015   HCT 32.7* 08/08/2015   MCV 108.6* 08/08/2015   PLT 160 08/08/2015   NEUTROABS 3.9 08/08/2015     Medications: I have reviewed the patient's current medications.  Assessment/Plan: 1. Adenocarcinoma the pancreas, uncinate mass, EUS April 2015 revealed a uT3,uN1 lesion with an FNA biopsy confirming adenocarcinoma  MRI abdomen 07/04/2014 confirmed a pancreas uncinate 8 mass, 11 mm portacaval lymph node, no vascular involvement, and no evidence of distant metastatic disease   PET scan 07/20/2012 with a hypermetabolic uncinate process mass and hypermetabolic periportal lymph node   Markedly elevated CA  19-9   Negative diagnostic laparoscopy 07/24/2014   Initiation of Xeloda/radiation 08/10/2014.completed 09/21/2014.  CT 10/03/2014 confirmed a decrease in the pancreas mass, stable portacaval lymph node, and apparent involvement of the superior mesenteric artery  Pancreaticoduodenectomy on 11/02/2014 confirmed a poorly differentiated adenocarcinoma, ypT2,ypN0  Initiation of adjuvant gemcitabine 12/27/2014  Rising CA 19-07 February 2015  Left thoracentesis 03/08/2015 confirmed metastatic adenocarcinoma  CTs 03/16/2015 with a left pleural effusion and new right lung nodule  Cycle 1 FOLFIRINOX 03/21/2015  Cycle 2 FOLFIRINOX 04/04/2015  Cycle 3 FOLFIRINOX 04/18/2015  Cycle 4 FOLFIRINOX 05/02/2015  Cycle 5 FOLFIRINOX 05/16/2015 (oxaliplatin dose reduced secondary to thrombocytopenia)  Chemotherapy held 05/30/2015 due to thrombocytopenia and diarrhea.  Cycle 6 FOLFIRINOX 06/06/2015  Cycle 7 FOLFIRINOX 06/27/2015 (oxaliplatin and irinotecan dose reduced)  Chest x-ray 07/10/2015 with a stable small left pleural effusion.  CT 07/24/2015 with a decreased left pleural effusion and resolution of pulmonary nodules, severe fatty infiltration of the liver, new large volume ascites, and a right lower lung pulmonary embolism  Paracentesis 07/26/2015 confirmed metastatic adenocarcinoma 2. Hypertension 3. Hyperlipidemia  4. Vaginal spotting summer 2015-evaluated by gynecology  5. Zoster rash at June 2015  6. Asthma  7. Family history of pancreas cancer 8. Nausea following the pancreaticoduodenectomy procedure. Improved. 9. Anemia secondary to chemotherapy; progressive anemia 06/12/2015 10. Admission with a fever 02/09/2015-no source for infection identified, treated for presumptive pneumonia 11. Left pleural effusion. Slightly larger on follow-up chest x-ray 03/06/2015. Status post a diagnostic/therapeutic thoracentesis 03/08/2015 cytology confirmed metastatic  adenocarcinoma 12. Dysarthria/tongue numbness at the completion of oxaliplatin with cycle 1 FOLFIRINOX 03/21/2015-spontaneously resolved over hours, similar symptoms including diplopia following subsequent cycles of FOLFIRINOX  13. History of Hypokalemia-likely secondary to diarrhea, she is maintained on a potassium supplement 14. Thrombocytopenia secondary to chemotherapy-chemotherapy held 05/30/2015; marked thrombocytopenia (12,000) 06/12/2015 15. Epistaxis 06/12/2015 secondary to #14. Status post packing and follow up with ENT. Packing removed 06/18/2015. 16. Right lower lobe pulmonary embolism noted on the CT 07/24/2015- xarelto started 07/25/2015 17. Jaundice-progressive over the past month, no biliary dilatation seen on the 08/09/2015 CT   Disposition:  Sonya Larson has metastatic pancreas cancer. She appears unchanged. She has persistent jaundice which limits her ability to receive further chemotherapy. She is not a clinical trial candidate. She saw Dr. Berneice Gandy and he recommends palliative care.  I discussed the case with Dr. Paulita Fujita. He evaluated the hyperbilirubinemia and there is no definitive explanation. He suspects infiltrative tumor involving the liver is causing cholestasis.  I discussed the current situation with Sony[facility] Larson and her husband. She understands that she is not a candidate for further chemotherapy. I recommend Hospice care. She agrees to a [facility] referral. We discussed CPR and ACLS issues. She is not ready to make a decision regarding her CODE STATUS.  Sonya Larson will be referred for a therapeutic paracentesis today. She will return for an office visit and planned paracentesis in 2 weeks.    Betsy Coder, MD  08/30/2015  2:20 PM

## 2015-08-30 NOTE — Telephone Encounter (Signed)
Faxed order to [facility] to admit pt. Dr. Benay Spice will be attending, with symptom management per Hospice MD. Requested hospice discuss code status.

## 2015-08-30 NOTE — Progress Notes (Signed)
Oncology Nurse Navigator Documentation  Oncology Nurse Navigator Flowsheets 08/30/2015  Navigator Encounter Type Follow up  Patient Visit Type Medonc  Treatment Phase Comfort care   Barriers/Navigation Needs Family concerns--comfort  Interventions Referrals;Coordination of Care  Referrals [facility] 10/6 or 10/7  Coordination of Care Radiology-set up paracentesis at next [place] with Patient [place] understands that her cancer has reached point that there is no treatment of benefit. Understands the need for comfort care and agrees to [place] referral. MD discussed Code Status, but she is undecided at this time. Has a lot of stress right now since she is POA of her sister's and her mom's affairs. Working on getting her sister's POA transferred to her daughter. Very uncomfortable-will have paracentesis tomorrow and at next visit. Discussed potential escalation of her pain meds when needed.

## 2015-08-31 ENCOUNTER — Ambulatory Visit (HOSPITAL_COMMUNITY)
Admission: RE | Admit: 2015-08-31 | Discharge: 2015-08-31 | Disposition: A | Discharge status: Home / Self Care | Payer: Medicare Other | Source: Ambulatory Visit | Attending: Oncology | Admitting: Oncology

## 2015-08-31 DIAGNOSIS — C259 Malignant neoplasm of pancreas, unspecified: Secondary | ICD-10-CM | POA: Insufficient documentation

## 2015-08-31 DIAGNOSIS — R188 Other ascites: Secondary | ICD-10-CM | POA: Diagnosis present

## 2015-08-31 DIAGNOSIS — C250 Malignant neoplasm of head of pancreas: Secondary | ICD-10-CM

## 2015-08-31 NOTE — Procedures (Signed)
Successful US guided paracentesis from RLQ.  Yielded 3.7L of clear yellow fluid.  No immediate complications.  Pt tolerated well.   Specimen was not sent for labs.  Ascencion Dike PA-C 08/31/2015 3:15 PM

## 2015-09-04 ENCOUNTER — Encounter: Payer: Self-pay | Admitting: Oncology

## 2015-09-04 NOTE — Progress Notes (Signed)
I placed fmla forms for elizabeth on desk of nurse for dr. Benay Spice.

## 2015-09-05 ENCOUNTER — Encounter: Payer: Self-pay | Admitting: Oncology

## 2015-09-05 NOTE — Progress Notes (Signed)
I faxed fmla form for Sonya Larson to [phone] and made a copy for her records and will leave at front with ms wilma for pick up

## 2015-09-06 ENCOUNTER — Telehealth: Payer: Self-pay | Admitting: *Deleted

## 2015-09-06 ENCOUNTER — Other Ambulatory Visit: Payer: Self-pay | Admitting: *Deleted

## 2015-09-06 DIAGNOSIS — C250 Malignant neoplasm of head of pancreas: Secondary | ICD-10-CM

## 2015-09-06 NOTE — Telephone Encounter (Signed)
Pt's husband, [place] Carbo, left message regarding pt needing a paracentesis today or tomorrow; POF and order placed per Dr. Benay Spice approval.  Called pt to let her know scheduling would be calling her with an appt, and she voices understanding.

## 2015-09-07 ENCOUNTER — Ambulatory Visit (HOSPITAL_COMMUNITY)
Admission: RE | Admit: 2015-09-07 | Discharge: 2015-09-07 | Disposition: A | Discharge status: Home / Self Care | Payer: Medicare Other | Source: Ambulatory Visit | Attending: Oncology | Admitting: Oncology

## 2015-09-07 DIAGNOSIS — C250 Malignant neoplasm of head of pancreas: Secondary | ICD-10-CM

## 2015-09-07 DIAGNOSIS — C259 Malignant neoplasm of pancreas, unspecified: Secondary | ICD-10-CM | POA: Insufficient documentation

## 2015-09-07 DIAGNOSIS — R188 Other ascites: Secondary | ICD-10-CM | POA: Diagnosis present

## 2015-09-07 NOTE — Procedures (Signed)
US guided therapeutic paracentesis performed yielding 5.3 liters yellow fluid. No immediate complications.

## 2015-09-14 ENCOUNTER — Ambulatory Visit (HOSPITAL_BASED_OUTPATIENT_CLINIC_OR_DEPARTMENT_OTHER): Payer: Medicare Other

## 2015-09-14 ENCOUNTER — Ambulatory Visit (HOSPITAL_BASED_OUTPATIENT_CLINIC_OR_DEPARTMENT_OTHER): Payer: Medicare Other | Admitting: Oncology

## 2015-09-14 ENCOUNTER — Ambulatory Visit (HOSPITAL_COMMUNITY)
Admission: RE | Admit: 2015-09-14 | Discharge: 2015-09-14 | Disposition: A | Discharge status: Home / Self Care | Payer: Medicare Other | Source: Ambulatory Visit | Attending: Oncology | Admitting: Oncology

## 2015-09-14 ENCOUNTER — Telehealth: Payer: Self-pay | Admitting: Oncology

## 2015-09-14 VITALS — BP 125/82 | HR 82 | Temp 97.8°F | Resp 18 | Ht 63.5 in | Wt 155.1 lb

## 2015-09-14 DIAGNOSIS — C259 Malignant neoplasm of pancreas, unspecified: Secondary | ICD-10-CM | POA: Diagnosis not present

## 2015-09-14 DIAGNOSIS — C257 Malignant neoplasm of other parts of pancreas: Secondary | ICD-10-CM

## 2015-09-14 DIAGNOSIS — R180 Malignant ascites: Secondary | ICD-10-CM

## 2015-09-14 DIAGNOSIS — K649 Unspecified hemorrhoids: Secondary | ICD-10-CM | POA: Diagnosis not present

## 2015-09-14 DIAGNOSIS — C250 Malignant neoplasm of head of pancreas: Secondary | ICD-10-CM

## 2015-09-14 DIAGNOSIS — R188 Other ascites: Secondary | ICD-10-CM | POA: Insufficient documentation

## 2015-09-14 DIAGNOSIS — R040 Epistaxis: Secondary | ICD-10-CM | POA: Diagnosis not present

## 2015-09-14 LAB — COMPREHENSIVE METABOLIC PANEL (CC13)
ALT: 24 U/L (ref 0–55)
ANION GAP: 6 meq/L (ref 3–11)
AST: 60 U/L — ABNORMAL HIGH (ref 5–34)
Albumin: 1.5 g/dL — ABNORMAL LOW (ref 3.5–5.0)
Alkaline Phosphatase: 627 U/L — ABNORMAL HIGH (ref 40–150)
BILIRUBIN TOTAL: 2.61 mg/dL — AB (ref 0.20–1.20)
BUN: 12.2 mg/dL (ref 7.0–26.0)
CO2: 23 meq/L (ref 22–29)
CREATININE: 0.7 mg/dL (ref 0.6–1.1)
Calcium: 7.9 mg/dL — ABNORMAL LOW (ref 8.4–10.4)
Chloride: 113 mEq/L — ABNORMAL HIGH (ref 98–109)
EGFR: >90 mL/min/{1.73_m2} (ref >90)
GLUCOSE: 101 mg/dL (ref 70–140)
Potassium: 3.8 mEq/L (ref 3.5–5.1)
Sodium: 142 mEq/L (ref 136–145)
TOTAL PROTEIN: 6.3 g/dL — AB (ref 6.4–8.3)

## 2015-09-14 MED ORDER — TRAMADOL HCL 50 MG PO TABS: 50.0000 mg | ORAL_TABLET | Freq: Four times a day (QID) | ORAL | Status: DC | PRN

## 2015-09-14 MED ORDER — HEPARIN SOD (PORK) LOCK FLUSH 100 UNIT/ML IV SOLN
500.0000 [IU] | Freq: Once | INTRAVENOUS | Status: AC
  Administered 2015-09-14: 500 [IU] via INTRAVENOUS
  Filled 2015-09-14: qty 5

## 2015-09-14 MED ORDER — MIRTAZAPINE 15 MG PO TABS: 15.0000 mg | ORAL_TABLET | Freq: Every day | ORAL | Status: DC

## 2015-09-14 MED ORDER — SODIUM CHLORIDE 0.9 % IJ SOLN
10.0000 mL | INTRAMUSCULAR | Status: DC | PRN
  Administered 2015-09-14: 10 mL via INTRAVENOUS
  Filled 2015-09-14: qty 10

## 2015-09-14 NOTE — Telephone Encounter (Signed)
Gave adn printed appt sched and avs fo rpt for NOV °

## 2015-09-14 NOTE — Procedures (Addendum)
Ultrasound-guided therapeutic paracentesis performed yielding 4.3 L yellow fluid. No immediate complications.

## 2015-09-14 NOTE — Patient Instructions (Signed)

## 2015-09-14 NOTE — Progress Notes (Signed)
[place] OFFICE PROGRESS NOTE   Diagnosis: Pancreas cancer  INTERVAL HISTORY:   Ms. Klomp returns as scheduled. She takes Tylenol for back pain. She reports mild nose bleeding and bleeding from "hemorrhoids "when she has a bowel movement. Diarrhea has improved. She has a good appetite. The abdomen remains distended. She last underwent a therapeutic paracentesis for 5.3 L of fluid on 09/07/2015.  Ms. Merlin met with the [facility] hospice team. She declines enrollment in hospice for now.  Objective:  Vital signs in last 24 hours:  Blood pressure 125/82, pulse 82, temperature 97.8 F (36.6 C), temperature source Oral, resp. rate 18, height 5' 3.5" (1.613 m), weight 155 lb 1.6 oz (70.353 kg), last menstrual period 10/31/2005, SpO2 100 %.    HEENT: Mild scleral icterus Resp: Lungs clear bilaterally Cardio: Regular rate and rhythm GI: Distended, no hepatomegaly Vascular: 1+ pitting edema at the low leg and ankle bilaterally   Portacath/PICC-without erythema  Lab Results: Potassium 3.8, creatinine 0.7, alkaline phosphatase 627, albumin 1.5, AST 60, ALT 24, bilirubin 2.61  Medications: I have reviewed the patient's current medications.  Assessment/Plan: 1. Adenocarcinoma the pancreas, uncinate mass, EUS April 2015 revealed a uT3,uN1 lesion with an FNA biopsy confirming adenocarcinoma  MRI abdomen 07/04/2014 confirmed a pancreas uncinate 8 mass, 11 mm portacaval lymph node, no vascular involvement, and no evidence of distant metastatic disease   PET scan 07/20/2012 with a hypermetabolic uncinate process mass and hypermetabolic periportal lymph node   Markedly elevated CA 19-9   Negative diagnostic laparoscopy 07/24/2014   Initiation of Xeloda/radiation 08/10/2014.completed 09/21/2014.  CT 10/03/2014 confirmed a decrease in the pancreas mass, stable portacaval lymph node, and apparent involvement of the superior mesenteric  artery  Pancreaticoduodenectomy on 11/02/2014 confirmed a poorly differentiated adenocarcinoma, ypT2,ypN0  Initiation of adjuvant gemcitabine 12/27/2014  Rising CA 19-07 February 2015  Left thoracentesis 03/08/2015 confirmed metastatic adenocarcinoma  CTs 03/16/2015 with a left pleural effusion and new right lung nodule  Cycle 1 FOLFIRINOX 03/21/2015  Cycle 2 FOLFIRINOX 04/04/2015  Cycle 3 FOLFIRINOX 04/18/2015  Cycle 4 FOLFIRINOX 05/02/2015  Cycle 5 FOLFIRINOX 05/16/2015 (oxaliplatin dose reduced secondary to thrombocytopenia)  Chemotherapy held 05/30/2015 due to thrombocytopenia and diarrhea.  Cycle 6 FOLFIRINOX 06/06/2015  Cycle 7 FOLFIRINOX 06/27/2015 (oxaliplatin and irinotecan dose reduced)  Chest x-ray 07/10/2015 with a stable small left pleural effusion.  CT 07/24/2015 with a decreased left pleural effusion and resolution of pulmonary nodules, severe fatty infiltration of the liver, new large volume ascites, and a right lower lung pulmonary embolism  Paracentesis 07/26/2015 confirmed metastatic adenocarcinoma 2. Hypertension 3. Hyperlipidemia  4. Vaginal spotting summer 2015-evaluated by gynecology  5. Zoster rash at June 2015  6. Asthma  7. Family history of pancreas cancer 8. Nausea following the pancreaticoduodenectomy procedure. Improved. 9. Anemia secondary to chemotherapy; progressive anemia 06/12/2015 10. Admission with a fever 02/09/2015-no source for infection identified, treated for presumptive pneumonia 11. Left pleural effusion. Slightly larger on follow-up chest x-ray 03/06/2015. Status post a diagnostic/therapeutic thoracentesis 03/08/2015 cytology confirmed metastatic adenocarcinoma 12. Dysarthria/tongue numbness at the completion of oxaliplatin with cycle 1 FOLFIRINOX 03/21/2015-spontaneously resolved over hours, similar symptoms including diplopia following subsequent cycles of FOLFIRINOX 13. History of Hypokalemia-likely secondary to diarrhea, she  is maintained on a potassium supplement 14. Thrombocytopenia secondary to chemotherapy-chemotherapy held 05/30/2015; marked thrombocytopenia (12,000) 06/12/2015, resolved 15. Epistaxis 06/12/2015 secondary to #14. Status post packing and follow up with ENT. Packing removed 06/18/2015. 16. Right lower lobe pulmonary embolism noted on the CT 07/24/2015- xarelto  started 07/25/2015 17. Jaundice-improved     Disposition:  Ms. Bartosik will undergo a therapeutic paracentesis today. She will be scheduled for a weekly paracentesis for relief of malignant ascites.  The hyperbilirubinemia has improved over the past month. It is possible the hyperbilirubinemia was related to toxicity from chemotherapy. We will check a chemistry panel when she returns in 3 weeks. We will consider gemcitabine/Abraxane chemotherapy if the hyperbilirubinemia continues to improve.  She will begin tramadol for pain. She reports feeling "depressed ". She agrees to a trial of Remeron.  Ms. Glacken will return for an office visit in 3 weeks.  Betsy Coder, MD  09/14/2015  1:43 PM

## 2015-09-21 ENCOUNTER — Ambulatory Visit (HOSPITAL_COMMUNITY)
Admission: RE | Admit: 2015-09-21 | Discharge: 2015-09-21 | Disposition: A | Discharge status: Home / Self Care | Payer: Medicare Other | Source: Ambulatory Visit | Attending: Oncology | Admitting: Oncology

## 2015-09-21 DIAGNOSIS — C259 Malignant neoplasm of pancreas, unspecified: Secondary | ICD-10-CM | POA: Diagnosis not present

## 2015-09-21 DIAGNOSIS — C250 Malignant neoplasm of head of pancreas: Secondary | ICD-10-CM

## 2015-09-21 DIAGNOSIS — R188 Other ascites: Secondary | ICD-10-CM | POA: Insufficient documentation

## 2015-09-21 NOTE — Procedures (Signed)
Successful US guided paracentesis from LLQ.  Yielded 5.4 liters of serous fluid.  No immediate complications.  Pt tolerated well.   Specimen was not sent for labs.  Tsosie Billing D PA-C 09/21/2015 10:46 AM

## 2015-09-24 ENCOUNTER — Telehealth: Payer: Self-pay | Admitting: *Deleted

## 2015-09-24 DIAGNOSIS — C250 Malignant neoplasm of head of pancreas: Secondary | ICD-10-CM

## 2015-09-24 NOTE — Telephone Encounter (Signed)
Per Request; notified pt's husband that paracentesis orders are in for 10/28 & 11/4 and scheduling with call with time.  Pt's husband verbalized understanding and expressed appreciation for call.

## 2015-09-26 NOTE — Telephone Encounter (Signed)
Notified pt that paracentesis 10/28 is @ [facility] at 10am--arrive at 9:45; also scheduled 10/05/15 @[facility]  11am; arrive at 10:45.  Pt verbalized understanding and expressed appreciation for call.

## 2015-09-26 NOTE — Addendum Note (Signed)
Addended by: Domenic Schwab on: 09/26/2015 01:34 PM   Modules accepted: Orders

## 2015-09-28 ENCOUNTER — Ambulatory Visit (HOSPITAL_COMMUNITY)
Admission: RE | Admit: 2015-09-28 | Discharge: 2015-09-28 | Disposition: A | Discharge status: Home / Self Care | Payer: Medicare Other | Source: Ambulatory Visit | Attending: Oncology | Admitting: Oncology

## 2015-09-28 DIAGNOSIS — C259 Malignant neoplasm of pancreas, unspecified: Secondary | ICD-10-CM | POA: Insufficient documentation

## 2015-09-28 DIAGNOSIS — R188 Other ascites: Secondary | ICD-10-CM | POA: Insufficient documentation

## 2015-09-28 DIAGNOSIS — C250 Malignant neoplasm of head of pancreas: Secondary | ICD-10-CM

## 2015-09-28 NOTE — Procedures (Signed)
US guided therapeutic paracentesis performed yielding 4.8 liters yellow fluid. No immediate complications.

## 2015-09-29 IMAGING — CR DG CHEST 1V PORT
1 series · 1 of 1 positions shown · non-contrast
Comparison: 07/24/2014

CLINICAL DATA: Right-sided PICC line placement. Nausea and vomiting
with sensation of something stuck in right-sided of throat.
In-hospital to receive Whipple procedure.

EXAM:
PORTABLE CHEST - 1 VIEW

[AP]
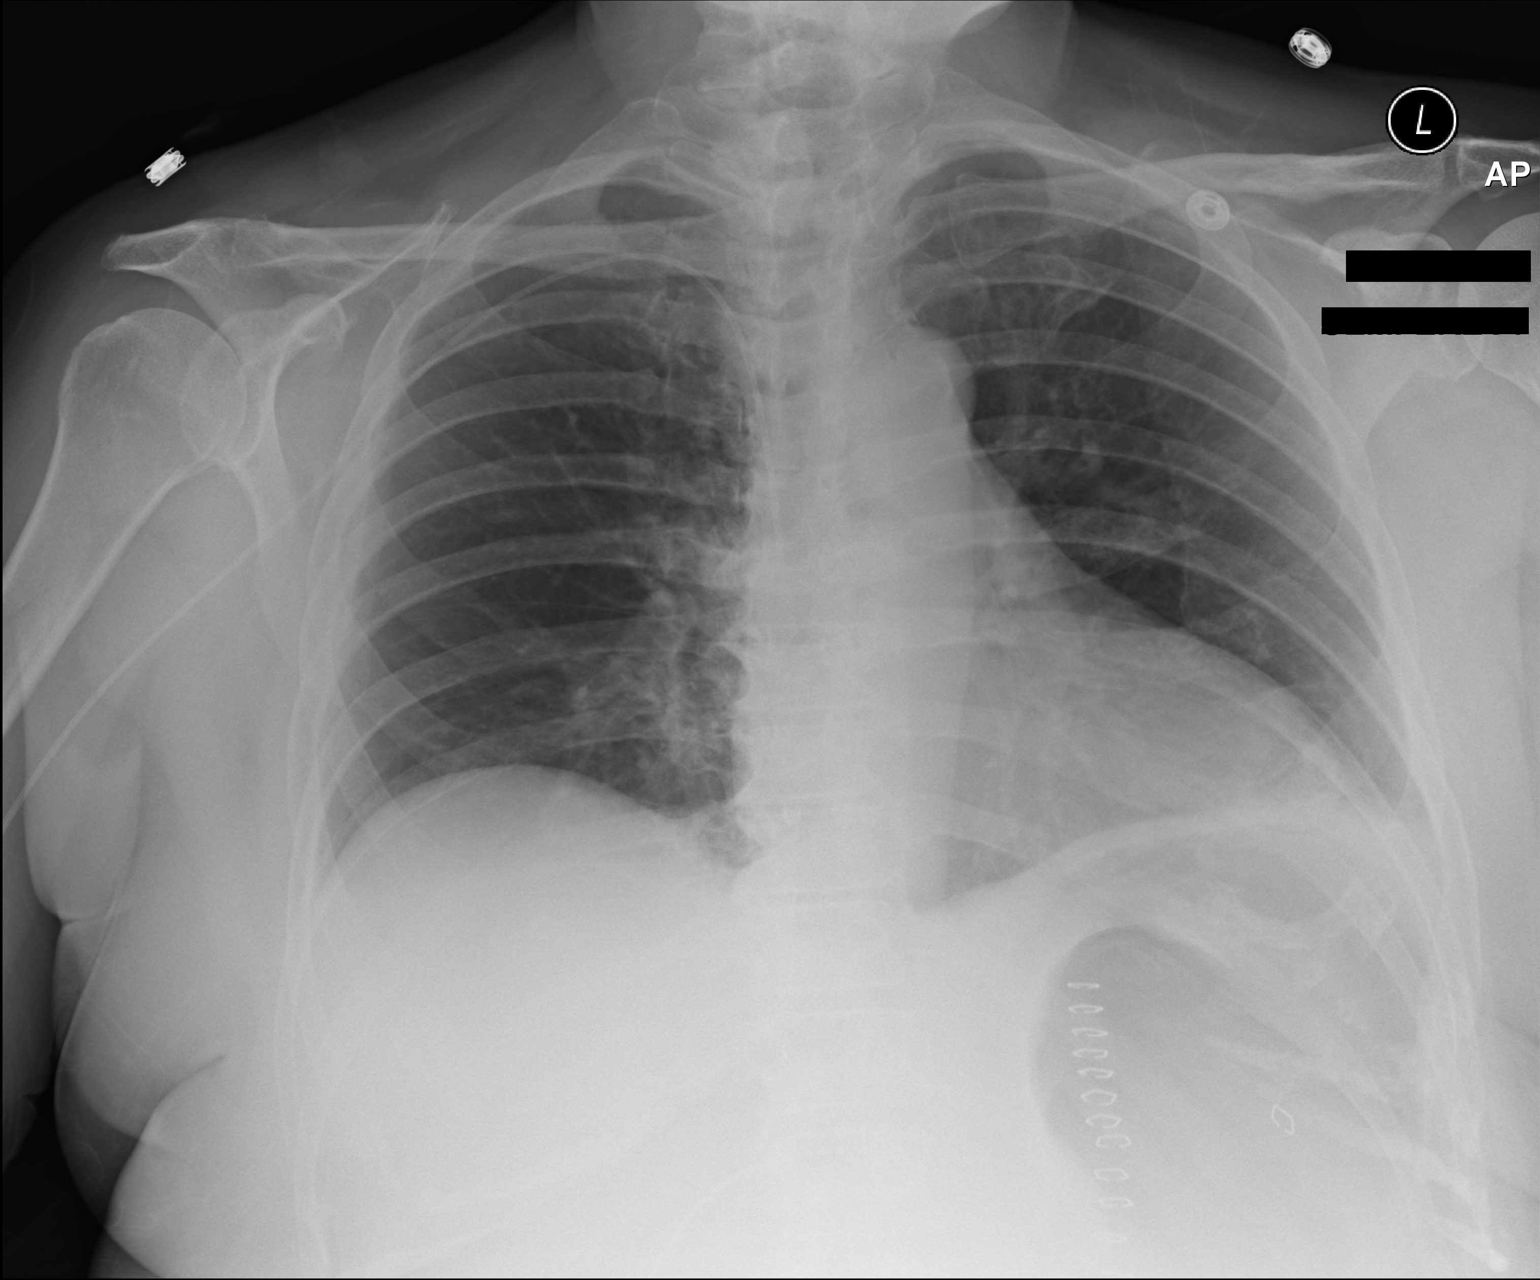

[1 of 1 positions shown; findings below may reference images not displayed]

FINDINGS: Right-sided PICC line is present with tip overlying the region of
the SVC. Lungs are hypoinflated with mild left basilar opacification
likely atelectasis/effusion although cannot exclude infection.
Cardiomediastinal silhouette is within normal. There are mild age in
changes of the spine. Skin staples are present over the left upper
quadrant.
IMPRESSION: Hypoinflation with mild left basilar opacification likely
atelectasis/effusion.

Right-sided PICC line with tip overlying the region of the SVC.

## 2015-10-04 ENCOUNTER — Telehealth: Payer: Self-pay | Admitting: Nurse Practitioner

## 2015-10-04 ENCOUNTER — Telehealth: Payer: Self-pay | Admitting: *Deleted

## 2015-10-04 ENCOUNTER — Ambulatory Visit (HOSPITAL_BASED_OUTPATIENT_CLINIC_OR_DEPARTMENT_OTHER): Payer: Medicare Other

## 2015-10-04 ENCOUNTER — Ambulatory Visit (HOSPITAL_BASED_OUTPATIENT_CLINIC_OR_DEPARTMENT_OTHER): Payer: Medicare Other | Admitting: Nurse Practitioner

## 2015-10-04 ENCOUNTER — Encounter: Payer: Self-pay | Admitting: *Deleted

## 2015-10-04 ENCOUNTER — Other Ambulatory Visit (HOSPITAL_BASED_OUTPATIENT_CLINIC_OR_DEPARTMENT_OTHER): Payer: Medicare Other

## 2015-10-04 VITALS — BP 129/85 | HR 115 | Temp 98.5°F | Resp 18 | Ht 63.5 in | Wt 143.8 lb

## 2015-10-04 DIAGNOSIS — C250 Malignant neoplasm of head of pancreas: Secondary | ICD-10-CM

## 2015-10-04 DIAGNOSIS — Z23 Encounter for immunization: Secondary | ICD-10-CM | POA: Diagnosis not present

## 2015-10-04 DIAGNOSIS — R822 Biliuria: Secondary | ICD-10-CM | POA: Diagnosis not present

## 2015-10-04 DIAGNOSIS — Z95828 Presence of other vascular implants and grafts: Secondary | ICD-10-CM

## 2015-10-04 DIAGNOSIS — C257 Malignant neoplasm of other parts of pancreas: Secondary | ICD-10-CM

## 2015-10-04 DIAGNOSIS — R180 Malignant ascites: Secondary | ICD-10-CM

## 2015-10-04 LAB — CBC WITH DIFFERENTIAL/PLATELET
BASO%: 0.5 % (ref 0.0–2.0)
BASOS ABS: 0 10*3/uL (ref 0.0–0.1)
EOS%: 0.3 % (ref 0.0–7.0)
Eosinophils Absolute: 0 10*3/uL (ref 0.0–0.5)
HEMATOCRIT: 36.4 % (ref 34.8–46.6)
HEMOGLOBIN: 12.2 g/dL (ref 11.6–15.9)
LYMPH#: 0.2 10*3/uL — AB (ref 0.9–3.3)
LYMPH%: 2.7 % — ABNORMAL LOW (ref 14.0–49.7)
MCH: 35.2 pg — AB (ref 25.1–34.0)
MCHC: 33.5 g/dL (ref 31.5–36.0)
MCV: 104.9 fL — ABNORMAL HIGH (ref 79.5–101.0)
MONO#: 0.4 10*3/uL (ref 0.1–0.9)
MONO%: 6.1 % (ref 0.0–14.0)
NEUT%: 90.4 % — ABNORMAL HIGH (ref 38.4–76.8)
NEUTROS ABS: 5.7 10*3/uL (ref 1.5–6.5)
Platelets: 210 10*3/uL (ref 145–400)
RBC: 3.47 10*6/uL — ABNORMAL LOW (ref 3.70–5.45)
RDW: 14.5 % (ref 11.2–14.5)
WBC: 6.3 10*3/uL (ref 3.9–10.3)

## 2015-10-04 LAB — COMPREHENSIVE METABOLIC PANEL (CC13)
ALBUMIN: 1.6 g/dL — AB (ref 3.5–5.0)
ALK PHOS: 732 U/L — AB (ref 40–150)
ALT: 38 U/L (ref 0–55)
AST: 97 U/L — ABNORMAL HIGH (ref 5–34)
Anion Gap: 8 mEq/L (ref 3–11)
BILIRUBIN TOTAL: 1.88 mg/dL — AB (ref 0.20–1.20)
BUN: 12.8 mg/dL (ref 7.0–26.0)
CO2: 18 mEq/L — ABNORMAL LOW (ref 22–29)
Calcium: 8.1 mg/dL — ABNORMAL LOW (ref 8.4–10.4)
Chloride: 113 mEq/L — ABNORMAL HIGH (ref 98–109)
Creatinine: 0.7 mg/dL (ref 0.6–1.1)
GLUCOSE: 104 mg/dL (ref 70–140)
Potassium: 3.3 mEq/L — ABNORMAL LOW (ref 3.5–5.1)
Sodium: 139 mEq/L (ref 136–145)
TOTAL PROTEIN: 6.3 g/dL — AB (ref 6.4–8.3)

## 2015-10-04 MED ORDER — OXYCODONE HCL 5 MG PO TABS: 5.0000 mg | ORAL_TABLET | Freq: Four times a day (QID) | ORAL | Status: DC | PRN

## 2015-10-04 MED ORDER — SODIUM CHLORIDE 0.9 % IJ SOLN
10.0000 mL | INTRAMUSCULAR | Status: DC | PRN
  Administered 2015-10-04: 10 mL via INTRAVENOUS
  Filled 2015-10-04: qty 10

## 2015-10-04 MED ORDER — HEPARIN SOD (PORK) LOCK FLUSH 100 UNIT/ML IV SOLN
500.0000 [IU] | Freq: Once | INTRAVENOUS | Status: AC
  Administered 2015-10-04: 500 [IU] via INTRAVENOUS
  Filled 2015-10-04: qty 5

## 2015-10-04 MED ORDER — INFLUENZA VAC SPLIT QUAD 0.5 ML IM SUSY
0.5000 mL | PREFILLED_SYRINGE | Freq: Once | INTRAMUSCULAR | Status: AC
  Administered 2015-10-04: 0.5 mL via INTRAMUSCULAR
  Filled 2015-10-04: qty 0.5

## 2015-10-04 NOTE — Progress Notes (Signed)
Oncology Nurse Navigator Documentation  Oncology Nurse Navigator Flowsheets 10/04/2015  Navigator Encounter Type Routine follow up  Patient Visit Type Medonc  Treatment Phase Treatment--evaluate for readiness  Barriers/Navigation Needs Family concerns;Education  Education Understanding Cancer/ Treatment Options;Pain/ Symptom Management;Preparing for Upcoming  Treatment  Interventions Education Method  Referrals -  Coordination of Care Encouraged her to get flu vaccine today  Education Method Verbal;Written;Teach-back on Gemzar & Abraxane and side effect management  Support Groups/Services -  Time Spent with Patient 30  Says taking Remeron made her "jittery"-so she stopped it after 1 week. Pain is worse-needs stronger pain med. APP is aware. She wants to try chemo again. Her and husband seem to not understand the severity of her cancer and how close she is to needing comfort and end of life care. Stressed to her that this tx has limited chance to help and that it is not curative-may only prolong her life for months.

## 2015-10-04 NOTE — Telephone Encounter (Signed)
per pof to sch pt appt-gave pt copy of avs-sent MW emailt o sch trmt and sent email to Lattie Haw T to adv no avaiiablity on 11/8-will clal pt after reply

## 2015-10-04 NOTE — Progress Notes (Addendum)
[facility] OFFICE PROGRESS NOTE   Diagnosis:  Pancreas cancer  INTERVAL HISTORY:   Sonya Larson returns as scheduled. She had a paracentesis on 09/28/2015. She notes improvement for about 5-6 days following the procedure and then reaccumulation of fluid. She is having more back pain. She takes Tylenol every 6 hours with temporary relief. She would like something stronger. She tried tramadol and it was not effective. She notes increased numbness in her hands and feet. She periodically "drops things". She is losing weight.  Objective:  Vital signs in last 24 hours:  Blood pressure 129/85, pulse 115, temperature 98.5 F (36.9 C), temperature source Oral, resp. rate 18, height 5' 3.5" (1.613 m), weight 143 lb 12.8 oz (65.227 kg), last menstrual period 10/31/2005, SpO2 100 %.   Cachectic appearance. HEENT: White coating over tongue. Resp: Lungs clear bilaterally. Cardio: Regular, tachycardic. GI: Abdomen is distended with ascites. Vascular: Pitting lower leg edema bilaterally. Skin: Dry. Port-A-Cath without erythema.    Lab Results:  Lab Results  Component Value Date   WBC 6.3 10/04/2015   HGB 12.2 10/04/2015   HCT 36.4 10/04/2015   MCV 104.9* 10/04/2015   PLT 210 10/04/2015   NEUTROABS 5.7 10/04/2015    Imaging:  No results found.  Medications: I have reviewed the patient's current medications.  Assessment/Plan: 1. Adenocarcinoma the pancreas, uncinate mass, EUS April 2015 revealed a uT3,uN1 lesion with an FNA biopsy confirming adenocarcinoma  MRI abdomen 07/04/2014 confirmed a pancreas uncinate 8 mass, 11 mm portacaval lymph node, no vascular involvement, and no evidence of distant metastatic disease   PET scan 07/20/2012 with a hypermetabolic uncinate process mass and hypermetabolic periportal lymph node   Markedly elevated CA 19-9   Negative diagnostic laparoscopy 07/24/2014   Initiation of Xeloda/radiation 08/10/2014.completed  09/21/2014.  CT 10/03/2014 confirmed a decrease in the pancreas mass, stable portacaval lymph node, and apparent involvement of the superior mesenteric artery  Pancreaticoduodenectomy on 11/02/2014 confirmed a poorly differentiated adenocarcinoma, ypT2,ypN0  Initiation of adjuvant gemcitabine 12/27/2014  Rising CA 19-07 February 2015  Left thoracentesis 03/08/2015 confirmed metastatic adenocarcinoma  CTs 03/16/2015 with a left pleural effusion and new right lung nodule  Cycle 1 FOLFIRINOX 03/21/2015  Cycle 2 FOLFIRINOX 04/04/2015  Cycle 3 FOLFIRINOX 04/18/2015  Cycle 4 FOLFIRINOX 05/02/2015  Cycle 5 FOLFIRINOX 05/16/2015 (oxaliplatin dose reduced secondary to thrombocytopenia)  Chemotherapy held 05/30/2015 due to thrombocytopenia and diarrhea.  Cycle 6 FOLFIRINOX 06/06/2015  Cycle 7 FOLFIRINOX 06/27/2015 (oxaliplatin and irinotecan dose reduced)  Chest x-ray 07/10/2015 with a stable small left pleural effusion.  CT 07/24/2015 with a decreased left pleural effusion and resolution of pulmonary nodules, severe fatty infiltration of the liver, new large volume ascites, and a right lower lung pulmonary embolism  Paracentesis 07/26/2015 confirmed metastatic adenocarcinoma 2. Hypertension 3. Hyperlipidemia  4. Vaginal spotting summer 2015-evaluated by gynecology  5. Zoster rash at June 2015  6. Asthma  7. Family history of pancreas cancer 8. Nausea following the pancreaticoduodenectomy procedure. Improved. 9. Anemia secondary to chemotherapy; progressive anemia 06/12/2015 10. Admission with a fever 02/09/2015-no source for infection identified, treated for presumptive pneumonia 11. Left pleural effusion. Slightly larger on follow-up chest x-ray 03/06/2015. Status post a diagnostic/therapeutic thoracentesis 03/08/2015 cytology confirmed metastatic adenocarcinoma 12. Dysarthria/tongue numbness at the completion of oxaliplatin with cycle 1 FOLFIRINOX 03/21/2015-spontaneously  resolved over hours, similar symptoms including diplopia following subsequent cycles of FOLFIRINOX 13. History of Hypokalemia-likely secondary to diarrhea, she is maintained on a potassium supplement 14. Thrombocytopenia secondary to chemotherapy-chemotherapy held  05/30/2015; marked thrombocytopenia (12,000) 06/12/2015, resolved 15. Epistaxis 06/12/2015 secondary to #14. Status post packing and follow up with ENT. Packing removed 06/18/2015. 16. Right lower lobe pulmonary embolism noted on the CT 07/24/2015- xarelto started 07/25/2015 17. Jaundice-improved    Disposition: Sonya Larson is scheduled for a therapeutic paracentesis tomorrow. She would like to continue a weekly paracentesis for recurrent malignant ascites.  The bilirubin continues to improve. We discussed initiating chemotherapy with gemcitabine/Abraxane versus a supportive care approach. She is interested in proceeding with chemotherapy. She understands there is some increased risk for toxicity due to liver dysfunction. We reviewed toxicities associated with chemotherapy including myelosuppression, nausea, mouth sores, diarrhea or constipation, hair loss. We discussed potential toxicities associated with gemcitabine including fever, rash, pneumonitis. We discussed potential toxicities associated with Abraxane including neuropathy. She understands the potential for worsening of the current neuropathy. She would like to proceed with the chemotherapy. The chemotherapy will be given on a 2 week schedule.   For the back pain she was given a prescription for oxycodone 5 mg 1-2 tablets every 6 hours as needed. She will contact the office if oxycodone is not effective.  She will return for cycle one gemcitabine/Abraxane on 10/09/2015. We will see her in follow-up prior to cycle 2 on 10/23/2015. She will contact the office in the interim as outlined above or with any other problems.  Patient seen with Dr. Benay Spice. 25 minutes were spent  face-to-face at today's visit with the majority of that time involving counseling/coordination of care.    Ned Card ANP/GNP-BC   10/04/2015  12:45 PM   This was a shared visit with Ned Card. Sonya Larson was interviewed and examined. She has persistent malignant ascites. The hyperbilirubinemia has improved. The elevated bilirubin was most likely related to toxicity from chemotherapy.  Sonya Larson would like to proceed with a trial of salvage chemotherapy. She understands the chance of clinical benefit is small and she is at increased risk of side effects secondary to the elevated bilirubin. We reviewed the potential toxicities associated with gemcitabine/Abraxane and she would like to proceed.  Julieanne Manson, M.D.

## 2015-10-04 NOTE — Telephone Encounter (Signed)
Per staff message and POF I have scheduled appts. Advised scheduler of appts and to move labs. JMW  

## 2015-10-04 NOTE — Patient Instructions (Signed)
Nanoparticle Albumin-Bound Paclitaxel injection  What is this medicine?  NANOPARTICLE ALBUMIN-BOUND PACLITAXEL (Na no PAHR ti kuhl al BYOO muhn-bound PAK li TAX el) is a chemotherapy drug. It targets fast dividing cells, like cancer cells, and causes these cells to die. This medicine is used to treat advanced breast cancer and advanced lung cancer.  This medicine may be used for other purposes; ask your health care provider or pharmacist if you have questions.  What should I tell my health care provider before I take this medicine?  They need to know if you have any of these conditions:  -kidney disease  -liver disease  -low blood counts, like low platelets, red blood cells, or white blood cells  -recent or ongoing radiation therapy  -an unusual or allergic reaction to paclitaxel, albumin, other chemotherapy, other medicines, foods, dyes, or preservatives  -pregnant or trying to get pregnant  -breast-feeding  How should I use this medicine?  This drug is given as an infusion into a vein. It is administered in a hospital or clinic by a specially trained health care professional.  Talk to your pediatrician regarding the use of this medicine in children. Special care may be needed.  Overdosage: If you think you have taken too much of this medicine contact a poison control center or emergency room at once.  NOTE: This medicine is only for you. Do not share this medicine with others.  What if I miss a dose?  It is important not to miss your dose. Call your doctor or health care professional if you are unable to keep an appointment.  What may interact with this medicine?  -cyclosporine  -diazepam  -ketoconazole  -medicines to increase blood counts like filgrastim, pegfilgrastim, sargramostim  -other chemotherapy drugs like cisplatin, doxorubicin, epirubicin, etoposide, teniposide, vincristine  -quinidine  -testosterone  -vaccines  -verapamil  Talk to your doctor or health care professional before taking any of these  medicines:  -acetaminophen  -aspirin  -ibuprofen  -ketoprofen  -naproxen  This list may not describe all possible interactions. Give your health care provider a list of all the medicines, herbs, non-prescription drugs, or dietary supplements you use. Also tell them if you smoke, drink alcohol, or use illegal drugs. Some items may interact with your medicine.  What should I watch for while using this medicine?  Your condition will be monitored carefully while you are receiving this medicine. You will need important blood work done while you are taking this medicine.  This drug may make you feel generally unwell. This is not uncommon, as chemotherapy can affect healthy cells as well as cancer cells. Report any side effects. Continue your course of treatment even though you feel ill unless your doctor tells you to stop.  In some cases, you may be given additional medicines to help with side effects. Follow all directions for their use.  Call your doctor or health care professional for advice if you get a fever, chills or sore throat, or other symptoms of a cold or flu. Do not treat yourself. This drug decreases your body's ability to fight infections. Try to avoid being around people who are sick.  This medicine may increase your risk to bruise or bleed. Call your doctor or health care professional if you notice any unusual bleeding.  Be careful brushing and flossing your teeth or using a toothpick because you may get an infection or bleed more easily. If you have any dental work done, tell your dentist you are receiving this   medicine.  Avoid taking products that contain aspirin, acetaminophen, ibuprofen, naproxen, or ketoprofen unless instructed by your doctor. These medicines may hide a fever.  Do not become pregnant while taking this medicine. Women should inform their doctor if they wish to become pregnant or think they might be pregnant. There is a potential for serious side effects to an unborn child. Talk to  your health care professional or pharmacist for more information. Do not breast-feed an infant while taking this medicine.  Men are advised not to father a child while receiving this medicine.  What side effects may I notice from receiving this medicine?  Side effects that you should report to your doctor or health care professional as soon as possible:  -allergic reactions like skin rash, itching or hives, swelling of the face, lips, or tongue  -low blood counts - This drug may decrease the number of white blood cells, red blood cells and platelets. You may be at increased risk for infections and bleeding.  -signs of infection - fever or chills, cough, sore throat, pain or difficulty passing urine  -signs of decreased platelets or bleeding - bruising, pinpoint red spots on the skin, black, tarry stools, nosebleeds  -signs of decreased red blood cells - unusually weak or tired, fainting spells, lightheadedness  -breathing problems  -changes in vision  -chest pain  -high or low blood pressure  -mouth sores  -nausea and vomiting  -pain, swelling, redness or irritation at the injection site  -pain, tingling, numbness in the hands or feet  -slow or irregular heartbeat  -swelling of the ankle, feet, hands  Side effects that usually do not require medical attention (report to your doctor or health care professional if they continue or are bothersome):  -aches, pains  -changes in the color of fingernails  -diarrhea  -hair loss  -loss of appetite  This list may not describe all possible side effects. Call your doctor for medical advice about side effects. You may report side effects to FDA at 1-800-FDA-1088.  Where should I keep my medicine?  This drug is given in a hospital or clinic and will not be stored at home.  NOTE: This sheet is a summary. It may not cover all possible information. If you have questions about this medicine, talk to your doctor, pharmacist, or health care provider.      2016, Elsevier/Gold Standard.  (2013-01-10 16:48:50)  Gemcitabine injection  What is this medicine?  GEMCITABINE (jem SIT a been) is a chemotherapy drug. This medicine is used to treat many types of cancer like breast cancer, lung cancer, pancreatic cancer, and ovarian cancer.  This medicine may be used for other purposes; ask your health care provider or pharmacist if you have questions.  What should I tell my health care provider before I take this medicine?  They need to know if you have any of these conditions:  -blood disorders  -infection  -kidney disease  -liver disease  -recent or ongoing radiation therapy  -an unusual or allergic reaction to gemcitabine, other chemotherapy, other medicines, foods, dyes, or preservatives  -pregnant or trying to get pregnant  -breast-feeding  How should I use this medicine?  This drug is given as an infusion into a vein. It is administered in a hospital or clinic by a specially trained health care professional.  Talk to your pediatrician regarding the use of this medicine in children. Special care may be needed.  Overdosage: If you think you have taken too much of this   medicine contact a poison control center or emergency room at once.  NOTE: This medicine is only for you. Do not share this medicine with others.  What if I miss a dose?  It is important not to miss your dose. Call your doctor or health care professional if you are unable to keep an appointment.  What may interact with this medicine?  -medicines to increase blood counts like filgrastim, pegfilgrastim, sargramostim  -some other chemotherapy drugs like cisplatin  -vaccines  Talk to your doctor or health care professional before taking any of these medicines:  -acetaminophen  -aspirin  -ibuprofen  -ketoprofen  -naproxen  This list may not describe all possible interactions. Give your health care provider a list of all the medicines, herbs, non-prescription drugs, or dietary supplements you use. Also tell them if you smoke, drink alcohol, or use  illegal drugs. Some items may interact with your medicine.  What should I watch for while using this medicine?  Visit your doctor for checks on your progress. This drug may make you feel generally unwell. This is not uncommon, as chemotherapy can affect healthy cells as well as cancer cells. Report any side effects. Continue your course of treatment even though you feel ill unless your doctor tells you to stop.  In some cases, you may be given additional medicines to help with side effects. Follow all directions for their use.  Call your doctor or health care professional for advice if you get a fever, chills or sore throat, or other symptoms of a cold or flu. Do not treat yourself. This drug decreases your body's ability to fight infections. Try to avoid being around people who are sick.  This medicine may increase your risk to bruise or bleed. Call your doctor or health care professional if you notice any unusual bleeding.  Be careful brushing and flossing your teeth or using a toothpick because you may get an infection or bleed more easily. If you have any dental work done, tell your dentist you are receiving this medicine.  Avoid taking products that contain aspirin, acetaminophen, ibuprofen, naproxen, or ketoprofen unless instructed by your doctor. These medicines may hide a fever.  Women should inform their doctor if they wish to become pregnant or think they might be pregnant. There is a potential for serious side effects to an unborn child. Talk to your health care professional or pharmacist for more information. Do not breast-feed an infant while taking this medicine.  What side effects may I notice from receiving this medicine?  Side effects that you should report to your doctor or health care professional as soon as possible:  -allergic reactions like skin rash, itching or hives, swelling of the face, lips, or tongue  -low blood counts - this medicine may decrease the number of white blood cells, red  blood cells and platelets. You may be at increased risk for infections and bleeding.  -signs of infection - fever or chills, cough, sore throat, pain or difficulty passing urine  -signs of decreased platelets or bleeding - bruising, pinpoint red spots on the skin, black, tarry stools, blood in the urine  -signs of decreased red blood cells - unusually weak or tired, fainting spells, lightheadedness  -breathing problems  -chest pain  -mouth sores  -nausea and vomiting  -pain, swelling, redness at site where injected  -pain, tingling, numbness in the hands or feet  -stomach pain  -swelling of ankles, feet, hands  -unusual bleeding  Side effects that usually do

## 2015-10-05 ENCOUNTER — Telehealth: Payer: Self-pay | Admitting: *Deleted

## 2015-10-05 ENCOUNTER — Ambulatory Visit (HOSPITAL_COMMUNITY)
Admission: RE | Admit: 2015-10-05 | Discharge: 2015-10-05 | Disposition: A | Discharge status: Home / Self Care | Payer: Medicare Other | Source: Ambulatory Visit | Attending: Oncology | Admitting: Oncology

## 2015-10-05 ENCOUNTER — Other Ambulatory Visit: Payer: Self-pay | Admitting: Nurse Practitioner

## 2015-10-05 DIAGNOSIS — C250 Malignant neoplasm of head of pancreas: Secondary | ICD-10-CM

## 2015-10-05 DIAGNOSIS — R188 Other ascites: Secondary | ICD-10-CM | POA: Insufficient documentation

## 2015-10-05 NOTE — Telephone Encounter (Signed)
-----   Message from Ladell Pier, MD sent at 10/04/2015  8:19 PM EDT ----- Is she taking kcl?

## 2015-10-05 NOTE — Procedures (Signed)
Ultrasound-guided therapeutic paracentesis performed yielding 4.6 L yellow fluid. No immediate complications. 

## 2015-10-05 NOTE — Telephone Encounter (Signed)
Called pt, she reports she resumed KCL after viewing her labs yesterday. Confirmed she is taking 20 meq BID. Will repeat labs 11/8.

## 2015-10-06 ENCOUNTER — Other Ambulatory Visit: Payer: Self-pay | Admitting: Oncology

## 2015-10-07 ENCOUNTER — Other Ambulatory Visit: Payer: Self-pay | Admitting: Oncology

## 2015-10-09 ENCOUNTER — Telehealth: Payer: Self-pay | Admitting: Nurse Practitioner

## 2015-10-09 ENCOUNTER — Other Ambulatory Visit: Payer: Self-pay | Admitting: Nurse Practitioner

## 2015-10-09 ENCOUNTER — Other Ambulatory Visit: Payer: Medicare Other

## 2015-10-09 ENCOUNTER — Ambulatory Visit (HOSPITAL_BASED_OUTPATIENT_CLINIC_OR_DEPARTMENT_OTHER): Payer: Medicare Other

## 2015-10-09 ENCOUNTER — Other Ambulatory Visit (HOSPITAL_BASED_OUTPATIENT_CLINIC_OR_DEPARTMENT_OTHER): Payer: Medicare Other

## 2015-10-09 ENCOUNTER — Ambulatory Visit: Payer: Medicare Other

## 2015-10-09 VITALS — BP 132/86 | HR 100 | Temp 97.4°F | Resp 18

## 2015-10-09 DIAGNOSIS — C250 Malignant neoplasm of head of pancreas: Secondary | ICD-10-CM

## 2015-10-09 DIAGNOSIS — Z95828 Presence of other vascular implants and grafts: Secondary | ICD-10-CM

## 2015-10-09 DIAGNOSIS — Z5111 Encounter for antineoplastic chemotherapy: Secondary | ICD-10-CM | POA: Diagnosis present

## 2015-10-09 DIAGNOSIS — C257 Malignant neoplasm of other parts of pancreas: Secondary | ICD-10-CM

## 2015-10-09 LAB — COMPREHENSIVE METABOLIC PANEL (CC13)
ALBUMIN: 1.6 g/dL — AB (ref 3.5–5.0)
ALK PHOS: 702 U/L — AB (ref 40–150)
ALT: 46 U/L (ref 0–55)
ANION GAP: 9 meq/L (ref 3–11)
AST: 91 U/L — ABNORMAL HIGH (ref 5–34)
BILIRUBIN TOTAL: 1.7 mg/dL — AB (ref 0.20–1.20)
BUN: 11.3 mg/dL (ref 7.0–26.0)
CALCIUM: 8.2 mg/dL — AB (ref 8.4–10.4)
CHLORIDE: 115 meq/L — AB (ref 98–109)
CO2: 17 mEq/L — ABNORMAL LOW (ref 22–29)
CREATININE: 0.8 mg/dL (ref 0.6–1.1)
EGFR: 83 mL/min/{1.73_m2} — ABNORMAL LOW (ref >90)
Glucose: 119 mg/dl (ref 70–140)
Potassium: 4.2 mEq/L (ref 3.5–5.1)
Sodium: 141 mEq/L (ref 136–145)
TOTAL PROTEIN: 6.1 g/dL — AB (ref 6.4–8.3)

## 2015-10-09 LAB — CBC WITH DIFFERENTIAL/PLATELET
BASO%: 0.7 % (ref 0.0–2.0)
Basophils Absolute: 0 10*3/uL (ref 0.0–0.1)
EOS%: 0.9 % (ref 0.0–7.0)
Eosinophils Absolute: 0 10*3/uL (ref 0.0–0.5)
HEMATOCRIT: 37.2 % (ref 34.8–46.6)
HEMOGLOBIN: 12.1 g/dL (ref 11.6–15.9)
LYMPH#: 0.4 10*3/uL — AB (ref 0.9–3.3)
LYMPH%: 9.7 % — ABNORMAL LOW (ref 14.0–49.7)
MCH: 34.6 pg — ABNORMAL HIGH (ref 25.1–34.0)
MCHC: 32.5 g/dL (ref 31.5–36.0)
MCV: 106.7 fL — ABNORMAL HIGH (ref 79.5–101.0)
MONO#: 0.3 10*3/uL (ref 0.1–0.9)
MONO%: 8.1 % (ref 0.0–14.0)
NEUT#: 3.3 10*3/uL (ref 1.5–6.5)
NEUT%: 80.6 % — AB (ref 38.4–76.8)
PLATELETS: 194 10*3/uL (ref 145–400)
RBC: 3.48 10*6/uL — ABNORMAL LOW (ref 3.70–5.45)
RDW: 15.3 % — AB (ref 11.2–14.5)
WBC: 4.1 10*3/uL (ref 3.9–10.3)

## 2015-10-09 MED ORDER — SODIUM CHLORIDE 0.9 % IV SOLN
Freq: Once | INTRAVENOUS | Status: AC
  Administered 2015-10-09: 15:00:00 via INTRAVENOUS
  Filled 2015-10-09: qty 4

## 2015-10-09 MED ORDER — SODIUM CHLORIDE 0.9 % IJ SOLN
10.0000 mL | INTRAMUSCULAR | Status: DC | PRN
  Administered 2015-10-09: 10 mL via INTRAVENOUS
  Filled 2015-10-09: qty 10

## 2015-10-09 MED ORDER — HYDROMORPHONE HCL 2 MG PO TABS: 2.0000 mg | ORAL_TABLET | ORAL | Status: DC | PRN

## 2015-10-09 MED ORDER — PACLITAXEL PROTEIN-BOUND CHEMO INJECTION 100 MG
75.0000 mg/m2 | Freq: Once | INTRAVENOUS | Status: AC
  Administered 2015-10-09: 125 mg via INTRAVENOUS
  Filled 2015-10-09: qty 25

## 2015-10-09 MED ORDER — SODIUM CHLORIDE 0.9 % IV SOLN
750.0000 mg/m2 | Freq: Once | INTRAVENOUS | Status: AC
  Administered 2015-10-09: 1292 mg via INTRAVENOUS
  Filled 2015-10-09: qty 33.98

## 2015-10-09 MED ORDER — SODIUM CHLORIDE 0.9 % IJ SOLN
10.0000 mL | INTRAMUSCULAR | Status: DC | PRN
  Administered 2015-10-09: 10 mL
  Filled 2015-10-09: qty 10

## 2015-10-09 MED ORDER — SODIUM CHLORIDE 0.9 % IV SOLN
Freq: Once | INTRAVENOUS | Status: AC
  Administered 2015-10-09: 15:00:00 via INTRAVENOUS

## 2015-10-09 MED ORDER — HEPARIN SOD (PORK) LOCK FLUSH 100 UNIT/ML IV SOLN
500.0000 [IU] | Freq: Once | INTRAVENOUS | Status: AC | PRN
  Administered 2015-10-09: 500 [IU]
  Filled 2015-10-09: qty 5

## 2015-10-09 NOTE — Progress Notes (Signed)
Labs reviewed by Ned Card, NP. Ok to proceed with first treatment for Gemzar/ Abraxane.

## 2015-10-09 NOTE — Patient Instructions (Signed)
[facility] Discharge Instructions for Patients Receiving Chemotherapy  Today you received the following chemotherapy agents Gemzar/ Abraxane To help prevent nausea and vomiting after your treatment, we encourage you to take your nausea medication Zofran 8mg  orally as needed. If you develop nausea and vomiting that is not controlled by your nausea medication, call the clinic.   BELOW ARE SYMPTOMS THAT SHOULD BE REPORTED IMMEDIATELY:  *FEVER GREATER THAN 100.5 F  *CHILLS WITH OR WITHOUT FEVER  NAUSEA AND VOMITING THAT IS NOT CONTROLLED WITH YOUR NAUSEA MEDICATION  *UNUSUAL SHORTNESS OF BREATH  *UNUSUAL BRUISING OR BLEEDING  TENDERNESS IN MOUTH AND THROAT WITH OR WITHOUT PRESENCE OF ULCERS  *URINARY PROBLEMS  *BOWEL PROBLEMS  UNUSUAL RASH Items with * indicate a potential emergency and should be followed up as soon as possible.  Feel free to call the clinic you have any questions or concerns. The clinic phone number is [phone].  Please show the [facility] at check-in to the Emergency Department and triage nurse.

## 2015-10-09 NOTE — Telephone Encounter (Signed)
cld & spoke to [pt and gave appt time for 11/8 appt-pt understood

## 2015-10-09 NOTE — Patient Instructions (Signed)

## 2015-10-10 ENCOUNTER — Telehealth: Payer: Self-pay | Admitting: *Deleted

## 2015-10-10 LAB — CANCER ANTIGEN 19-9: CA 19 9: 6537.1 U/mL — AB (ref <35.0)

## 2015-10-10 NOTE — Telephone Encounter (Signed)
-----   Message from Brien Few, RN sent at 10/09/2015  5:08 PM EST ----- Regarding: Dr. Benay Spice. First time Gemzar/ Abraxane Dr. Benay Spice. Follow up first time Gemzar/ Abraxane. Pt tolerated treatment well

## 2015-10-10 NOTE — Telephone Encounter (Signed)
-----   Message from Sonya Pier, MD sent at 10/10/2015  9:37 AM EST ----- Please call patient, tumor marker higher as expected while off of therapy

## 2015-10-10 NOTE — Telephone Encounter (Signed)
Per Dr. Benay Spice; notified pt that ca19.9 is higher as expected while off of therapy.  Pt verbalized understanding.

## 2015-10-10 NOTE — Telephone Encounter (Signed)
Spoke with pt's husband, he reports she feels well today. Just went to bed after taking pain med. Has been up since 0800. Denies nausea/ anorexia or diarrhea. He understands to call as needed for symptom management.

## 2015-10-12 ENCOUNTER — Ambulatory Visit (HOSPITAL_COMMUNITY)
Admission: RE | Admit: 2015-10-12 | Discharge: 2015-10-12 | Disposition: A | Discharge status: Home / Self Care | Payer: Medicare Other | Source: Ambulatory Visit | Attending: Nurse Practitioner | Admitting: Nurse Practitioner

## 2015-10-12 DIAGNOSIS — C259 Malignant neoplasm of pancreas, unspecified: Secondary | ICD-10-CM | POA: Insufficient documentation

## 2015-10-12 DIAGNOSIS — R180 Malignant ascites: Secondary | ICD-10-CM | POA: Diagnosis present

## 2015-10-12 DIAGNOSIS — C250 Malignant neoplasm of head of pancreas: Secondary | ICD-10-CM

## 2015-10-12 NOTE — Procedures (Signed)
Successful US guided paracentesis from [facility].  Yielded 3.7 liters of clear yellow fluid.  No immediate complications.  Pt tolerated well.   Specimen was not sent for labs.  Celsey Asselin S Aalijah Lanphere PA-C 10/12/2015 9:45 AM

## 2015-10-16 ENCOUNTER — Telehealth: Payer: Self-pay

## 2015-10-16 NOTE — Telephone Encounter (Signed)
Husband called stating pt has had nose bleeds 2-3 x/day. For the last 4-5 days. Today has been almost constant, from a trickle to a flood. Starting to clot a little b/c it is stringy. They are using afrin on cotton ball and this is not helping. He suggested Dr Constance Holster ENT b/c pt does not want to really do anything.  S/w Dr Benay Spice and he said to go to ER so they can get the bleeding stopped. It is combination of xarelto and probable low platelets from chemo on 11/8 of Abraxane and Gemzar. Called husband back with this information. He can get Dr Janeice Robinson opinion as well if he wants.

## 2015-10-19 ENCOUNTER — Ambulatory Visit (HOSPITAL_COMMUNITY)
Admission: RE | Admit: 2015-10-19 | Discharge: 2015-10-19 | Disposition: A | Discharge status: Home / Self Care | Payer: Medicare Other | Source: Ambulatory Visit | Attending: Nurse Practitioner | Admitting: Nurse Practitioner

## 2015-10-19 DIAGNOSIS — R180 Malignant ascites: Secondary | ICD-10-CM | POA: Insufficient documentation

## 2015-10-19 DIAGNOSIS — C250 Malignant neoplasm of head of pancreas: Secondary | ICD-10-CM

## 2015-10-19 DIAGNOSIS — C259 Malignant neoplasm of pancreas, unspecified: Secondary | ICD-10-CM | POA: Diagnosis not present

## 2015-10-19 NOTE — Procedures (Signed)
Ultrasound-guided therapeutic paracentesis performed yielding 2.8 L yellow fluid. No immediate complications.

## 2015-10-21 ENCOUNTER — Other Ambulatory Visit: Payer: Self-pay | Admitting: Oncology

## 2015-10-23 ENCOUNTER — Ambulatory Visit: Payer: Medicare Other | Admitting: Nurse Practitioner

## 2015-10-23 ENCOUNTER — Inpatient Hospital Stay: Payer: Medicare Other

## 2015-10-23 ENCOUNTER — Other Ambulatory Visit (HOSPITAL_BASED_OUTPATIENT_CLINIC_OR_DEPARTMENT_OTHER): Payer: Medicare Other

## 2015-10-23 ENCOUNTER — Encounter: Payer: Self-pay | Admitting: *Deleted

## 2015-10-23 ENCOUNTER — Ambulatory Visit (HOSPITAL_BASED_OUTPATIENT_CLINIC_OR_DEPARTMENT_OTHER): Payer: Medicare Other

## 2015-10-23 ENCOUNTER — Ambulatory Visit: Payer: Medicare Other

## 2015-10-23 ENCOUNTER — Ambulatory Visit (HOSPITAL_BASED_OUTPATIENT_CLINIC_OR_DEPARTMENT_OTHER): Payer: Medicare Other | Admitting: Oncology

## 2015-10-23 ENCOUNTER — Telehealth: Payer: Self-pay | Admitting: Oncology

## 2015-10-23 VITALS — BP 117/82 | HR 95 | Temp 97.5°F | Resp 16 | Ht 63.5 in | Wt 131.5 lb

## 2015-10-23 DIAGNOSIS — C250 Malignant neoplasm of head of pancreas: Secondary | ICD-10-CM

## 2015-10-23 DIAGNOSIS — C257 Malignant neoplasm of other parts of pancreas: Secondary | ICD-10-CM

## 2015-10-23 DIAGNOSIS — Z5111 Encounter for antineoplastic chemotherapy: Secondary | ICD-10-CM | POA: Diagnosis present

## 2015-10-23 DIAGNOSIS — G620 Drug-induced polyneuropathy: Secondary | ICD-10-CM

## 2015-10-23 DIAGNOSIS — Z95828 Presence of other vascular implants and grafts: Secondary | ICD-10-CM

## 2015-10-23 LAB — CBC WITH DIFFERENTIAL/PLATELET
BASO%: 1.6 % (ref 0.0–2.0)
Basophils Absolute: 0 10*3/uL (ref 0.0–0.1)
EOS ABS: 0 10*3/uL (ref 0.0–0.5)
EOS%: 1.3 % (ref 0.0–7.0)
HEMATOCRIT: 28.2 % — AB (ref 34.8–46.6)
HEMOGLOBIN: 9.2 g/dL — AB (ref 11.6–15.9)
LYMPH%: 8.1 % — AB (ref 14.0–49.7)
MCH: 34.5 pg — ABNORMAL HIGH (ref 25.1–34.0)
MCHC: 32.8 g/dL (ref 31.5–36.0)
MCV: 104.9 fL — AB (ref 79.5–101.0)
MONO#: 0.4 10*3/uL (ref 0.1–0.9)
MONO%: 22 % — ABNORMAL HIGH (ref 0.0–14.0)
NEUT%: 67 % (ref 38.4–76.8)
NEUTROS ABS: 1.3 10*3/uL — AB (ref 1.5–6.5)
PLATELETS: 151 10*3/uL (ref 145–400)
RBC: 2.68 10*6/uL — ABNORMAL LOW (ref 3.70–5.45)
RDW: 15 % — ABNORMAL HIGH (ref 11.2–14.5)
WBC: 2 10*3/uL — AB (ref 3.9–10.3)
lymph#: 0.2 10*3/uL — ABNORMAL LOW (ref 0.9–3.3)

## 2015-10-23 LAB — COMPREHENSIVE METABOLIC PANEL (CC13)
ALBUMIN: 1.7 g/dL — AB (ref 3.5–5.0)
ALK PHOS: 434 U/L — AB (ref 40–150)
ALT: 22 U/L (ref 0–55)
ANION GAP: 7 meq/L (ref 3–11)
AST: 36 U/L — ABNORMAL HIGH (ref 5–34)
BILIRUBIN TOTAL: 1.55 mg/dL — AB (ref 0.20–1.20)
BUN: 9 mg/dL (ref 7.0–26.0)
CALCIUM: 8 mg/dL — AB (ref 8.4–10.4)
CO2: 22 meq/L (ref 22–29)
CREATININE: 0.7 mg/dL (ref 0.6–1.1)
Chloride: 108 mEq/L (ref 98–109)
Glucose: 112 mg/dl (ref 70–140)
Potassium: 3.2 mEq/L — ABNORMAL LOW (ref 3.5–5.1)
Sodium: 137 mEq/L (ref 136–145)
TOTAL PROTEIN: 5.4 g/dL — AB (ref 6.4–8.3)

## 2015-10-23 MED ORDER — SODIUM CHLORIDE 0.9 % IV SOLN
Freq: Once | INTRAVENOUS | Status: AC
  Administered 2015-10-23: 11:00:00 via INTRAVENOUS

## 2015-10-23 MED ORDER — HEPARIN SOD (PORK) LOCK FLUSH 100 UNIT/ML IV SOLN
500.0000 [IU] | Freq: Once | INTRAVENOUS | Status: DC | PRN
  Filled 2015-10-23: qty 5

## 2015-10-23 MED ORDER — SODIUM CHLORIDE 0.9 % IJ SOLN
10.0000 mL | INTRAMUSCULAR | Status: DC | PRN
  Filled 2015-10-23: qty 10

## 2015-10-23 MED ORDER — PALONOSETRON HCL INJECTION 0.25 MG/5ML
0.2500 mg | Freq: Once | INTRAVENOUS | Status: AC
  Administered 2015-10-23: 0.25 mg via INTRAVENOUS

## 2015-10-23 MED ORDER — DEXAMETHASONE SODIUM PHOSPHATE 100 MG/10ML IJ SOLN
Freq: Once | INTRAMUSCULAR | Status: DC
  Filled 2015-10-23: qty 4

## 2015-10-23 MED ORDER — SODIUM CHLORIDE 0.9 % IJ SOLN
10.0000 mL | INTRAMUSCULAR | Status: DC | PRN
  Administered 2015-10-23: 10 mL via INTRAVENOUS
  Filled 2015-10-23: qty 10

## 2015-10-23 MED ORDER — PACLITAXEL PROTEIN-BOUND CHEMO INJECTION 100 MG
75.0000 mg/m2 | Freq: Once | INTRAVENOUS | Status: AC
  Administered 2015-10-23: 125 mg via INTRAVENOUS
  Filled 2015-10-23: qty 25

## 2015-10-23 MED ORDER — SODIUM CHLORIDE 0.9 % IV SOLN
Freq: Once | INTRAVENOUS | Status: AC
  Administered 2015-10-23: 12:00:00 via INTRAVENOUS
  Filled 2015-10-23: qty 5

## 2015-10-23 MED ORDER — SODIUM CHLORIDE 0.9 % IV SOLN
600.0000 mg/m2 | Freq: Once | INTRAVENOUS | Status: AC
  Administered 2015-10-23: 988 mg via INTRAVENOUS
  Filled 2015-10-23: qty 25.98

## 2015-10-23 MED ORDER — PALONOSETRON HCL INJECTION 0.25 MG/5ML
INTRAVENOUS | Status: AC
  Filled 2015-10-23: qty 5

## 2015-10-23 NOTE — Progress Notes (Signed)
Oncology Nurse Navigator Documentation  Oncology Nurse Navigator Flowsheets 10/23/2015  Navigator Encounter Type Treatment  Patient Visit Type Medonc  Treatment Phase Treatment: Cycle 1/day 15   Gemzar & Abraxane  Barriers/Navigation Needs Family concerns  Education Pain/ Symptom Management  Interventions Education Method  Referrals -  Coordination of Care -  Education Method Verbal;Teach-back  Support Groups/Services -  Time Spent with Patient 15  Discussed reason for [facility] understands and agrees. Strongly encouraged her to use walker when ambulating in home since her foot swelling and neuropathy is significant. Her husband has a walker of his he can adjust for her height.

## 2015-10-23 NOTE — Progress Notes (Signed)
Per Dr. Benay Spice: OK to treat with ANC 1.3. Pt will receive Neulasta Day 2. Barnetta Chapel, Infusion RN notified.

## 2015-10-23 NOTE — Patient Instructions (Signed)
[facility] Discharge Instructions for Patients Receiving Chemotherapy  Today you received the following chemotherapy agents Abraxane/Gemzar.  To help prevent nausea and vomiting after your treatment, we encourage you to take your nausea medication as prescribed.    If you develop nausea and vomiting that is not controlled by your nausea medication, call the clinic.   BELOW ARE SYMPTOMS THAT SHOULD BE REPORTED IMMEDIATELY:  *FEVER GREATER THAN 100.5 F  *CHILLS WITH OR WITHOUT FEVER  NAUSEA AND VOMITING THAT IS NOT CONTROLLED WITH YOUR NAUSEA MEDICATION  *UNUSUAL SHORTNESS OF BREATH  *UNUSUAL BRUISING OR BLEEDING  TENDERNESS IN MOUTH AND THROAT WITH OR WITHOUT PRESENCE OF ULCERS  *URINARY PROBLEMS  *BOWEL PROBLEMS  UNUSUAL RASH Items with * indicate a potential emergency and should be followed up as soon as possible.  Feel free to call the clinic you have any questions or concerns. The clinic phone number is [phone].  Please show the CHEMO ALERT CARD at check-in to the Emergency Department and triage nurse.   

## 2015-10-23 NOTE — Patient Instructions (Signed)

## 2015-10-23 NOTE — Telephone Encounter (Signed)
Spoke with patient spouse re appointments for 12/7 and 12/8. Patient will get new schedule 11/23.

## 2015-10-23 NOTE — Progress Notes (Signed)
[facility] OFFICE PROGRESS NOTE   Diagnosis: Pancreas cancer  INTERVAL HISTORY:   Sonya Larson returns as scheduled. She completed a first treatment with gemcitabine/Abraxane 10/09/2015. She developed nose bleeding during the week following chemotherapy and was evaluated by ENT on 10/16/2015. She reports Dr. Wilburn Cornelia was able to cauterize the bleeding site. No recurrent bleeding. She has no other bleeding aside from "hemorrhoid "bleeding. She had fatigue following chemotherapy. She continues weekly paracentesis procedures, last on 10/19/2015 for 2.8 L of fluid. She continues to have peripheral neuropathy symptoms and leg edema.  Objective:  Vital signs in last 24 hours:  Blood pressure 117/82, pulse 95, temperature 97.5 F (36.4 C), temperature source Oral, resp. rate 16, height 5' 3.5" (1.613 m), weight 131 lb 8 oz (59.648 kg), last menstrual period 10/31/2005, SpO2 100 %.    HEENT: No thrush or ulcers, sclera anicteric Resp: Lungs clear bilaterally Cardio: Regular rate and rhythm GI: Distended with ascites, nontender Vascular: Pitting edema throughout both legs and feet    Portacath/PICC-without erythema  Lab Results:  Lab Results  Component Value Date   WBC 2.0* 10/23/2015   HGB 9.2* 10/23/2015   HCT 28.2* 10/23/2015   MCV 104.9* 10/23/2015   PLT 151 10/23/2015   NEUTROABS 1.3* 10/23/2015   potassium 3.2, creatinine 0.7, alk phosphatase 434, bilirubin 1.55   Medications: I have reviewed the patient's current medications.  Assessment/Plan: 1. Adenocarcinoma the pancreas, uncinate mass, EUS April 2015 revealed a uT3,uN1 lesion with an FNA biopsy confirming adenocarcinoma  MRI abdomen 07/04/2014 confirmed a pancreas uncinate 8 mass, 11 mm portacaval lymph node, no vascular involvement, and no evidence of distant metastatic disease   PET scan 07/20/2012 with a hypermetabolic uncinate process mass and hypermetabolic periportal lymph node   Markedly  elevated CA 19-9   Negative diagnostic laparoscopy 07/24/2014   Initiation of Xeloda/radiation 08/10/2014.completed 09/21/2014.  CT 10/03/2014 confirmed a decrease in the pancreas mass, stable portacaval lymph node, and apparent involvement of the superior mesenteric artery  Pancreaticoduodenectomy on 11/02/2014 confirmed a poorly differentiated adenocarcinoma, ypT2,ypN0  Initiation of adjuvant gemcitabine 12/27/2014  Rising CA 19-07 February 2015  Left thoracentesis 03/08/2015 confirmed metastatic adenocarcinoma  CTs 03/16/2015 with a left pleural effusion and new right lung nodule  Cycle 1 FOLFIRINOX 03/21/2015  Cycle 2 FOLFIRINOX 04/04/2015  Cycle 3 FOLFIRINOX 04/18/2015  Cycle 4 FOLFIRINOX 05/02/2015  Cycle 5 FOLFIRINOX 05/16/2015 (oxaliplatin dose reduced secondary to thrombocytopenia)  Chemotherapy held 05/30/2015 due to thrombocytopenia and diarrhea.  Cycle 6 FOLFIRINOX 06/06/2015  Cycle 7 FOLFIRINOX 06/27/2015 (oxaliplatin and irinotecan dose reduced)  Chest x-ray 07/10/2015 with a stable small left pleural effusion.  CT 07/24/2015 with a decreased left pleural effusion and resolution of pulmonary nodules, severe fatty infiltration of the liver, new large volume ascites, and a right lower lung pulmonary embolism  Paracentesis 07/26/2015 confirmed metastatic adenocarcinoma  Cycle 1 gemcitabine/Abraxane 10/09/2015 2. Hypertension 3. Hyperlipidemia  4. Vaginal spotting summer 2015-evaluated by gynecology  5. Zoster rash at June 2015  6. Asthma  7. Family history of pancreas cancer 8. Nausea following the pancreaticoduodenectomy procedure. Improved. 9. Anemia secondary to chemotherapy; progressive anemia 06/12/2015 10. Admission with a fever 02/09/2015-no source for infection identified, treated for presumptive pneumonia 11. Left pleural effusion. Slightly larger on follow-up chest x-ray 03/06/2015. Status post a diagnostic/therapeutic thoracentesis 03/08/2015  cytology confirmed metastatic adenocarcinoma 12. Dysarthria/tongue numbness at the completion of oxaliplatin with cycle 1 FOLFIRINOX 03/21/2015-spontaneously resolved over hours, similar symptoms including diplopia following subsequent cycles of FOLFIRINOX 13.  History of Hypokalemia-likely secondary to diarrhea, she is maintained on a potassium supplement 14. Thrombocytopenia secondary to chemotherapy-chemotherapy held 05/30/2015; marked thrombocytopenia (12,000) 06/12/2015, resolved 15. Epistaxis 06/12/2015 secondary to #14. Status post packing and follow up with ENT. Packing removed 06/18/2015. Recurrent epistaxis, status post cautery by ENT 10/16/2015 16. Right lower lobe pulmonary embolism noted on the CT 07/24/2015- xarelto started 07/25/2015 17. Jaundice-improved 18. 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 The plan is to proceed with a second cycle of gemcitabine/Abraxane today. The gemcitabine will be dose reduced secondary to the borderline platelet count and recent epistaxis while maintained on Xarelto. She will receive Neulasta with this cycle of chemotherapy.  Sonya Larson will undergo a palliative paracentesis on 10/26/2015. She will return for an office visit and the next cycle of chemotherapy on 11/07/2015.  Betsy Coder, MD  10/23/2015  11:15 AM

## 2015-10-24 ENCOUNTER — Ambulatory Visit (HOSPITAL_BASED_OUTPATIENT_CLINIC_OR_DEPARTMENT_OTHER): Payer: Medicare Other

## 2015-10-24 VITALS — BP 124/82 | HR 85 | Temp 97.8°F

## 2015-10-24 DIAGNOSIS — C250 Malignant neoplasm of head of pancreas: Secondary | ICD-10-CM

## 2015-10-24 MED ORDER — PEGFILGRASTIM INJECTION 6 MG/0.6ML ~~LOC~~
6.0000 mg | PREFILLED_SYRINGE | Freq: Once | SUBCUTANEOUS | Status: AC
  Administered 2015-10-24: 6 mg via SUBCUTANEOUS
  Filled 2015-10-24: qty 0.6

## 2015-10-26 ENCOUNTER — Ambulatory Visit (HOSPITAL_COMMUNITY)
Admission: RE | Admit: 2015-10-26 | Discharge: 2015-10-26 | Disposition: A | Discharge status: Home / Self Care | Payer: Medicare Other | Source: Ambulatory Visit | Attending: Nurse Practitioner | Admitting: Nurse Practitioner

## 2015-10-26 DIAGNOSIS — C259 Malignant neoplasm of pancreas, unspecified: Secondary | ICD-10-CM | POA: Insufficient documentation

## 2015-10-26 DIAGNOSIS — R188 Other ascites: Secondary | ICD-10-CM | POA: Diagnosis not present

## 2015-10-26 DIAGNOSIS — C250 Malignant neoplasm of head of pancreas: Secondary | ICD-10-CM

## 2015-10-26 NOTE — Procedures (Signed)
US guided therapeutic paracentesis performed yielding 2.7 liters yellow fluid. No immediate complications. 

## 2015-11-02 ENCOUNTER — Ambulatory Visit (HOSPITAL_COMMUNITY)
Admission: RE | Admit: 2015-11-02 | Discharge: 2015-11-02 | Disposition: A | Discharge status: Home / Self Care | Payer: Medicare Other | Source: Ambulatory Visit | Attending: Nurse Practitioner | Admitting: Nurse Practitioner

## 2015-11-02 DIAGNOSIS — R180 Malignant ascites: Secondary | ICD-10-CM | POA: Insufficient documentation

## 2015-11-02 DIAGNOSIS — C250 Malignant neoplasm of head of pancreas: Secondary | ICD-10-CM

## 2015-11-02 NOTE — Procedures (Signed)
Successful US guided paracentesis from RLQ.  Yielded 2.9L of clear yellow fluid.  No immediate complications.  Pt tolerated well.   Specimen was not sent for labs.  Ascencion Dike PA-C 11/02/2015 10:08 AM

## 2015-11-04 ENCOUNTER — Other Ambulatory Visit: Payer: Self-pay | Admitting: Oncology

## 2015-11-07 ENCOUNTER — Encounter: Payer: Self-pay | Admitting: *Deleted

## 2015-11-07 ENCOUNTER — Telehealth: Payer: Self-pay | Admitting: Oncology

## 2015-11-07 ENCOUNTER — Ambulatory Visit: Payer: Medicare Other

## 2015-11-07 ENCOUNTER — Ambulatory Visit (HOSPITAL_BASED_OUTPATIENT_CLINIC_OR_DEPARTMENT_OTHER): Payer: Medicare Other | Admitting: Oncology

## 2015-11-07 ENCOUNTER — Other Ambulatory Visit: Payer: Self-pay | Admitting: *Deleted

## 2015-11-07 ENCOUNTER — Other Ambulatory Visit (HOSPITAL_BASED_OUTPATIENT_CLINIC_OR_DEPARTMENT_OTHER): Payer: Medicare Other

## 2015-11-07 ENCOUNTER — Ambulatory Visit (HOSPITAL_BASED_OUTPATIENT_CLINIC_OR_DEPARTMENT_OTHER): Payer: Medicare Other

## 2015-11-07 ENCOUNTER — Inpatient Hospital Stay: Payer: Medicare Other

## 2015-11-07 VITALS — BP 115/70 | HR 124 | Temp 98.0°F | Resp 18 | Ht 63.5 in | Wt 128.8 lb

## 2015-11-07 DIAGNOSIS — G609 Hereditary and idiopathic neuropathy, unspecified: Secondary | ICD-10-CM

## 2015-11-07 DIAGNOSIS — C250 Malignant neoplasm of head of pancreas: Secondary | ICD-10-CM

## 2015-11-07 DIAGNOSIS — C257 Malignant neoplasm of other parts of pancreas: Secondary | ICD-10-CM | POA: Diagnosis present

## 2015-11-07 DIAGNOSIS — Z5111 Encounter for antineoplastic chemotherapy: Secondary | ICD-10-CM

## 2015-11-07 DIAGNOSIS — Z95828 Presence of other vascular implants and grafts: Secondary | ICD-10-CM

## 2015-11-07 LAB — CBC WITH DIFFERENTIAL/PLATELET
BASO%: 0.5 % (ref 0.0–2.0)
BASOS ABS: 0.1 10*3/uL (ref 0.0–0.1)
EOS ABS: 0 10*3/uL (ref 0.0–0.5)
EOS%: 0.4 % (ref 0.0–7.0)
HCT: 26.7 % — ABNORMAL LOW (ref 34.8–46.6)
HGB: 8.8 g/dL — ABNORMAL LOW (ref 11.6–15.9)
LYMPH%: 2.9 % — AB (ref 14.0–49.7)
MCH: 35.9 pg — AB (ref 25.1–34.0)
MCHC: 33.1 g/dL (ref 31.5–36.0)
MCV: 108.5 fL — AB (ref 79.5–101.0)
MONO#: 0.8 10*3/uL (ref 0.1–0.9)
MONO%: 6.5 % (ref 0.0–14.0)
NEUT%: 89.7 % — AB (ref 38.4–76.8)
NEUTROS ABS: 11.2 10*3/uL — AB (ref 1.5–6.5)
PLATELETS: 222 10*3/uL (ref 145–400)
RBC: 2.46 10*6/uL — AB (ref 3.70–5.45)
RDW: 15.9 % — ABNORMAL HIGH (ref 11.2–14.5)
WBC: 12.5 10*3/uL — ABNORMAL HIGH (ref 3.9–10.3)
lymph#: 0.4 10*3/uL — ABNORMAL LOW (ref 0.9–3.3)

## 2015-11-07 LAB — COMPREHENSIVE METABOLIC PANEL
ALBUMIN: 1.6 g/dL — AB (ref 3.5–5.0)
ALK PHOS: 429 U/L — AB (ref 40–150)
ALT: 12 U/L (ref 0–55)
ANION GAP: 9 meq/L (ref 3–11)
AST: 35 U/L — AB (ref 5–34)
BUN: 14.3 mg/dL (ref 7.0–26.0)
CALCIUM: 7.9 mg/dL — AB (ref 8.4–10.4)
CO2: 18 mEq/L — ABNORMAL LOW (ref 22–29)
Chloride: 113 mEq/L — ABNORMAL HIGH (ref 98–109)
Creatinine: 0.8 mg/dL (ref 0.6–1.1)
EGFR: 75 mL/min/{1.73_m2} — ABNORMAL LOW (ref >90)
Glucose: 140 mg/dl (ref 70–140)
POTASSIUM: 3.2 meq/L — AB (ref 3.5–5.1)
Sodium: 140 mEq/L (ref 136–145)
Total Bilirubin: 1.15 mg/dL (ref 0.20–1.20)
Total Protein: 5.5 g/dL — ABNORMAL LOW (ref 6.4–8.3)

## 2015-11-07 LAB — HOLD TUBE, BLOOD BANK

## 2015-11-07 LAB — CANCER ANTIGEN 19-9: CA 19 9: 4930.3 U/mL — AB (ref <35.0)

## 2015-11-07 MED ORDER — PALONOSETRON HCL INJECTION 0.25 MG/5ML
INTRAVENOUS | Status: AC
  Filled 2015-11-07: qty 5

## 2015-11-07 MED ORDER — SODIUM CHLORIDE 0.9 % IV SOLN
600.0000 mg/m2 | Freq: Once | INTRAVENOUS | Status: AC
  Administered 2015-11-07: 988 mg via INTRAVENOUS
  Filled 2015-11-07: qty 25.98

## 2015-11-07 MED ORDER — SODIUM CHLORIDE 0.9 % IJ SOLN
10.0000 mL | INTRAMUSCULAR | Status: DC | PRN
  Administered 2015-11-07: 10 mL
  Filled 2015-11-07: qty 10

## 2015-11-07 MED ORDER — PACLITAXEL PROTEIN-BOUND CHEMO INJECTION 100 MG
75.0000 mg/m2 | Freq: Once | INTRAVENOUS | Status: AC
  Administered 2015-11-07: 125 mg via INTRAVENOUS
  Filled 2015-11-07: qty 25

## 2015-11-07 MED ORDER — MEGESTROL ACETATE 40 MG/ML PO SUSP: 200.0000 mg | Freq: Two times a day (BID) | ORAL | Status: DC

## 2015-11-07 MED ORDER — SODIUM CHLORIDE 0.9 % IJ SOLN
10.0000 mL | INTRAMUSCULAR | Status: DC | PRN
  Administered 2015-11-07: 10 mL via INTRAVENOUS
  Filled 2015-11-07: qty 10

## 2015-11-07 MED ORDER — HEPARIN SOD (PORK) LOCK FLUSH 100 UNIT/ML IV SOLN
500.0000 [IU] | Freq: Once | INTRAVENOUS | Status: AC | PRN
  Administered 2015-11-07: 500 [IU]
  Filled 2015-11-07: qty 5

## 2015-11-07 MED ORDER — SODIUM CHLORIDE 0.9 % IV SOLN
Freq: Once | INTRAVENOUS | Status: AC
  Administered 2015-11-07: 12:00:00 via INTRAVENOUS

## 2015-11-07 MED ORDER — PALONOSETRON HCL INJECTION 0.25 MG/5ML
0.2500 mg | Freq: Once | INTRAVENOUS | Status: AC
  Administered 2015-11-07: 0.25 mg via INTRAVENOUS

## 2015-11-07 MED ORDER — SODIUM CHLORIDE 0.9 % IV SOLN
Freq: Once | INTRAVENOUS | Status: AC
  Administered 2015-11-07: 12:00:00 via INTRAVENOUS
  Filled 2015-11-07: qty 5

## 2015-11-07 NOTE — Progress Notes (Signed)
[facility] OFFICE PROGRESS NOTE   Diagnosis: Pancreas cancer  INTERVAL HISTORY:   Sonya Larson returns as scheduled. She completed another treatment with gemcitabine/Abraxane on 10/23/2015. No fever or nausea following chemotherapy. She has noted increased neuropathy symptoms in the hands and feet. She has difficulty holding some objects and feels unsteady on her feet. Her appetite has diminished. She is drinking adequate fluids. She had a mild nosebleed that did not require her to go to the emergency room.  She had a paracentesis on 11/02/2015 for 2.9 L. The abdomen is less distended. Objective:  Vital signs in last 24 hours:  Blood pressure 115/70, pulse 124, temperature 98 F (36.7 C), temperature source Oral, resp. rate 18, height 5' 3.5" (1.613 m), weight 128 lb 12.8 oz (58.423 kg), last menstrual period 10/31/2005, SpO2 100 %.    HEENT: No thrush or ulcers Resp: Lungs clear bilaterally Cardio: Regular rate and rhythm GI: No hepatomegaly, mild upper abdomen tenderness, no mass, ascites is present Vascular: Trace edema at the right greater than left lower leg Neuro: Moderate loss of vibratory sense at the very tips bilaterally   Portacath/PICC-without erythema  Lab Results:  Lab Results  Component Value Date   WBC 12.5* 11/07/2015   HGB 8.8* 11/07/2015   HCT 26.7* 11/07/2015   MCV 108.5* 11/07/2015   PLT 222 11/07/2015   NEUTROABS 11.2* 11/07/2015   Potassium 3.2, creatinine 0.8, alkaline phosphatase 0.29, albumin 1.6, bilirubin 1.15  Medications: I have reviewed the patient's current medications.  Assessment/Plan: 1. Adenocarcinoma the pancreas, uncinate mass, EUS April 2015 revealed a uT3,uN1 lesion with an FNA biopsy confirming adenocarcinoma  MRI abdomen 07/04/2014 confirmed a pancreas uncinate 8 mass, 11 mm portacaval lymph node, no vascular involvement, and no evidence of distant metastatic disease   PET scan 07/20/2012 with a hypermetabolic  uncinate process mass and hypermetabolic periportal lymph node   Markedly elevated CA 19-9   Negative diagnostic laparoscopy 07/24/2014   Initiation of Xeloda/radiation 08/10/2014.completed 09/21/2014.  CT 10/03/2014 confirmed a decrease in the pancreas mass, stable portacaval lymph node, and apparent involvement of the superior mesenteric artery  Pancreaticoduodenectomy on 11/02/2014 confirmed a poorly differentiated adenocarcinoma, ypT2,ypN0  Initiation of adjuvant gemcitabine 12/27/2014  Rising CA 19-07 February 2015  Left thoracentesis 03/08/2015 confirmed metastatic adenocarcinoma  CTs 03/16/2015 with a left pleural effusion and new right lung nodule  Cycle 1 FOLFIRINOX 03/21/2015  Cycle 2 FOLFIRINOX 04/04/2015  Cycle 3 FOLFIRINOX 04/18/2015  Cycle 4 FOLFIRINOX 05/02/2015  Cycle 5 FOLFIRINOX 05/16/2015 (oxaliplatin dose reduced secondary to thrombocytopenia)  Chemotherapy held 05/30/2015 due to thrombocytopenia and diarrhea.  Cycle 6 FOLFIRINOX 06/06/2015  Cycle 7 FOLFIRINOX 06/27/2015 (oxaliplatin and irinotecan dose reduced)  Chest x-ray 07/10/2015 with a stable small left pleural effusion.  CT 07/24/2015 with a decreased left pleural effusion and resolution of pulmonary nodules, severe fatty infiltration of the liver, new large volume ascites, and a right lower lung pulmonary embolism  Paracentesis 07/26/2015 confirmed metastatic adenocarcinoma  Cycle 1 gemcitabine/Abraxane 10/09/2015  Cycle 2 gemcitabine/Abraxane 10/23/2015  Cycle 3 gemcitabine/Abraxane 11/07/2015 2. Hypertension 3. Hyperlipidemia  4. Vaginal spotting summer 2015-evaluated by gynecology  5. Zoster rash at June 2015  6. Asthma  7. Family history of pancreas cancer 8. Nausea following the pancreaticoduodenectomy procedure. Improved. 9. Anemia secondary to chemotherapy; progressive anemia 06/12/2015 10. Admission with a fever 02/09/2015-no source for infection identified, treated for  presumptive pneumonia 11. Left pleural effusion. Slightly larger on follow-up chest x-ray 03/06/2015. Status post a diagnostic/therapeutic thoracentesis 03/08/2015  cytology confirmed metastatic adenocarcinoma 12. Dysarthria/tongue numbness at the completion of oxaliplatin with cycle 1 FOLFIRINOX 03/21/2015-spontaneously resolved over hours, similar symptoms including diplopia following subsequent cycles of FOLFIRINOX 13. History of Hypokalemia-likely secondary to diarrhea, she is maintained on a potassium supplement 14. Thrombocytopenia secondary to chemotherapy-chemotherapy held 05/30/2015; marked thrombocytopenia (12,000) 06/12/2015, resolved 15. Epistaxis 06/12/2015 secondary to #14. Status post packing and follow up with ENT. Packing removed 06/18/2015. Recurrent epistaxis, status post cautery by ENT 10/16/2015 16. Right lower lobe pulmonary embolism noted on the CT 07/24/2015- xarelto started 07/25/2015 17. Jaundice-improved 18. 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 Sonya Larson appears to be tolerating the treatment well a side from neuropathy. I suspect the neuropathy is mostly related to previous oxaliplatin therapy.  The plan is to proceed with another cycle of gemcitabine/Abraxane today. She will not receive Neulasta with this cycle. We added Megace as an appetite stimulant. She will return for a therapeutic paracentesis on 11/13/2015.  Sonya Larson is scheduled for an office visit and chemotherapy on 11/22/2015. We will follow-up on the CA 19-9 from today.  The hyperbilirubinemia has resolved. It is possible the elevated bilirubin level was related to toxicity from chemotherapy or pancreas cancer.  Betsy Coder, MD  11/07/2015  10:15 AM

## 2015-11-07 NOTE — Patient Instructions (Signed)
[facility] Discharge Instructions for Patients Receiving Chemotherapy  Today you received the following chemotherapy agents Abraxane/Gemzar.  To help prevent nausea and vomiting after your treatment, we encourage you to take your nausea medication as prescribed.    If you develop nausea and vomiting that is not controlled by your nausea medication, call the clinic.   BELOW ARE SYMPTOMS THAT SHOULD BE REPORTED IMMEDIATELY:  *FEVER GREATER THAN 100.5 F  *CHILLS WITH OR WITHOUT FEVER  NAUSEA AND VOMITING THAT IS NOT CONTROLLED WITH YOUR NAUSEA MEDICATION  *UNUSUAL SHORTNESS OF BREATH  *UNUSUAL BRUISING OR BLEEDING  TENDERNESS IN MOUTH AND THROAT WITH OR WITHOUT PRESENCE OF ULCERS  *URINARY PROBLEMS  *BOWEL PROBLEMS  UNUSUAL RASH Items with * indicate a potential emergency and should be followed up as soon as possible.  Feel free to call the clinic you have any questions or concerns. The clinic phone number is [phone].  Please show the CHEMO ALERT CARD at check-in to the Emergency Department and triage nurse.   

## 2015-11-07 NOTE — Progress Notes (Signed)
Oncology Nurse Navigator Documentation  Oncology Nurse Navigator Flowsheets 11/07/2015  Navigator Encounter Type Treatment  Patient Visit Type Medonc  Treatment Phase Treatment cycle #2 day 1-  Barriers/Navigation Needs No barriers at this time  Education Other--Fall Precautions  Interventions Education Method  Referrals -  Coordination of Care -  Education Method Verbal  Support Groups/Services -  Time Spent with Patient 15

## 2015-11-07 NOTE — Telephone Encounter (Signed)
Gave and printed appts ched and avs for pt for DEC  °

## 2015-11-07 NOTE — Patient Instructions (Signed)

## 2015-11-08 ENCOUNTER — Ambulatory Visit: Payer: Medicare Other

## 2015-11-08 ENCOUNTER — Telehealth: Payer: Self-pay | Admitting: *Deleted

## 2015-11-08 NOTE — Telephone Encounter (Signed)
-----   Message from Ladell Pier, MD sent at 11/07/2015  5:18 PM EST ----- Please call patient, be sure she is taking potassium

## 2015-11-08 NOTE — Telephone Encounter (Signed)
Pt's husband returned call; left voice message that pt had NOT be taking potassium as directed.  Husband states he will make sure she is taking 2 tablets twice a day as directed.  Dr. Benay Spice made aware.

## 2015-11-08 NOTE — Telephone Encounter (Signed)
Per Dr. Benay Spice, left message for pt to call office re: is she taking her potassium; how much; how often?

## 2015-11-09 ENCOUNTER — Ambulatory Visit (HOSPITAL_COMMUNITY): Payer: Medicare Other

## 2015-11-13 ENCOUNTER — Ambulatory Visit (HOSPITAL_COMMUNITY)
Admission: RE | Admit: 2015-11-13 | Discharge: 2015-11-13 | Disposition: A | Discharge status: Home / Self Care | Payer: Medicare Other | Source: Ambulatory Visit | Attending: Nurse Practitioner | Admitting: Nurse Practitioner

## 2015-11-13 DIAGNOSIS — R188 Other ascites: Secondary | ICD-10-CM | POA: Diagnosis present

## 2015-11-13 DIAGNOSIS — C250 Malignant neoplasm of head of pancreas: Secondary | ICD-10-CM

## 2015-11-13 NOTE — Procedures (Signed)
Ultrasound-guided therapeutic paracentesis performed yielding 3.2 L yellow fluid. No immediate complications.

## 2015-11-15 ENCOUNTER — Ambulatory Visit (HOSPITAL_COMMUNITY): Payer: Medicare Other

## 2015-11-16 ENCOUNTER — Ambulatory Visit (HOSPITAL_COMMUNITY): Payer: Medicare Other

## 2015-11-22 ENCOUNTER — Ambulatory Visit (HOSPITAL_BASED_OUTPATIENT_CLINIC_OR_DEPARTMENT_OTHER): Payer: Medicare Other | Admitting: Oncology

## 2015-11-22 ENCOUNTER — Other Ambulatory Visit (HOSPITAL_BASED_OUTPATIENT_CLINIC_OR_DEPARTMENT_OTHER): Payer: Medicare Other

## 2015-11-22 ENCOUNTER — Other Ambulatory Visit: Payer: Self-pay | Admitting: *Deleted

## 2015-11-22 ENCOUNTER — Ambulatory Visit (HOSPITAL_BASED_OUTPATIENT_CLINIC_OR_DEPARTMENT_OTHER): Payer: Medicare Other

## 2015-11-22 ENCOUNTER — Ambulatory Visit: Payer: Medicare Other

## 2015-11-22 ENCOUNTER — Encounter (HOSPITAL_COMMUNITY)
Admission: RE | Admit: 2015-11-22 | Discharge: 2015-11-22 | Disposition: A | Discharge status: Home / Self Care | Payer: Medicare Other | Source: Ambulatory Visit | Attending: Oncology | Admitting: Oncology

## 2015-11-22 ENCOUNTER — Telehealth: Payer: Self-pay | Admitting: Oncology

## 2015-11-22 VITALS — BP 124/87 | HR 113 | Temp 97.3°F | Resp 20 | Ht 63.5 in | Wt 138.8 lb

## 2015-11-22 VITALS — BP 131/81 | HR 98 | Temp 98.5°F | Resp 18

## 2015-11-22 VITALS — BP 124/87 | HR 113 | Temp 97.3°F | Resp 20

## 2015-11-22 DIAGNOSIS — C250 Malignant neoplasm of head of pancreas: Secondary | ICD-10-CM

## 2015-11-22 DIAGNOSIS — G620 Drug-induced polyneuropathy: Secondary | ICD-10-CM

## 2015-11-22 DIAGNOSIS — D6481 Anemia due to antineoplastic chemotherapy: Secondary | ICD-10-CM | POA: Diagnosis present

## 2015-11-22 DIAGNOSIS — C259 Malignant neoplasm of pancreas, unspecified: Secondary | ICD-10-CM

## 2015-11-22 DIAGNOSIS — R180 Malignant ascites: Secondary | ICD-10-CM | POA: Diagnosis not present

## 2015-11-22 DIAGNOSIS — R5381 Other malaise: Secondary | ICD-10-CM | POA: Diagnosis not present

## 2015-11-22 DIAGNOSIS — C257 Malignant neoplasm of other parts of pancreas: Secondary | ICD-10-CM

## 2015-11-22 DIAGNOSIS — D638 Anemia in other chronic diseases classified elsewhere: Secondary | ICD-10-CM

## 2015-11-22 DIAGNOSIS — I2699 Other pulmonary embolism without acute cor pulmonale: Secondary | ICD-10-CM | POA: Diagnosis not present

## 2015-11-22 DIAGNOSIS — E46 Unspecified protein-calorie malnutrition: Secondary | ICD-10-CM | POA: Diagnosis not present

## 2015-11-22 LAB — CBC WITH DIFFERENTIAL/PLATELET
BASO%: 0.8 % (ref 0.0–2.0)
BASOS ABS: 0 10*3/uL (ref 0.0–0.1)
EOS%: 1.6 % (ref 0.0–7.0)
Eosinophils Absolute: 0 10*3/uL (ref 0.0–0.5)
HEMATOCRIT: 23.4 % — AB (ref 34.8–46.6)
HEMOGLOBIN: 7.5 g/dL — AB (ref 11.6–15.9)
LYMPH#: 0.3 10*3/uL — AB (ref 0.9–3.3)
LYMPH%: 9.3 % — AB (ref 14.0–49.7)
MCH: 36.7 pg — AB (ref 25.1–34.0)
MCHC: 32.1 g/dL (ref 31.5–36.0)
MCV: 114.1 fL — AB (ref 79.5–101.0)
MONO#: 0.6 10*3/uL (ref 0.1–0.9)
MONO%: 19.8 % — ABNORMAL HIGH (ref 0.0–14.0)
NEUT#: 2.1 10*3/uL (ref 1.5–6.5)
NEUT%: 68.5 % (ref 38.4–76.8)
Platelets: 217 10*3/uL (ref 145–400)
RBC: 2.05 10*6/uL — ABNORMAL LOW (ref 3.70–5.45)
RDW: 24.2 % — ABNORMAL HIGH (ref 11.2–14.5)
WBC: 3 10*3/uL — ABNORMAL LOW (ref 3.9–10.3)
nRBC: 0 % (ref 0–0)

## 2015-11-22 LAB — COMPREHENSIVE METABOLIC PANEL
ALBUMIN: 1.5 g/dL — AB (ref 3.5–5.0)
ALK PHOS: 424 U/L — AB (ref 40–150)
ALT: 15 U/L (ref 0–55)
ANION GAP: 6 meq/L (ref 3–11)
AST: 34 U/L (ref 5–34)
BUN: 11 mg/dL (ref 7.0–26.0)
CALCIUM: 7.8 mg/dL — AB (ref 8.4–10.4)
CHLORIDE: 116 meq/L — AB (ref 98–109)
CO2: 15 mEq/L — ABNORMAL LOW (ref 22–29)
Creatinine: 0.8 mg/dL (ref 0.6–1.1)
EGFR: 78 mL/min/{1.73_m2} — AB (ref >90)
Glucose: 129 mg/dl (ref 70–140)
POTASSIUM: 3.4 meq/L — AB (ref 3.5–5.1)
Sodium: 137 mEq/L (ref 136–145)
Total Bilirubin: 1.14 mg/dL (ref 0.20–1.20)
Total Protein: 5.5 g/dL — ABNORMAL LOW (ref 6.4–8.3)

## 2015-11-22 LAB — PREPARE RBC (CROSSMATCH)

## 2015-11-22 MED ORDER — MEGESTROL ACETATE 40 MG PO TABS: 80.0000 mg | ORAL_TABLET | Freq: Two times a day (BID) | ORAL | Status: DC

## 2015-11-22 MED ORDER — HEPARIN SOD (PORK) LOCK FLUSH 100 UNIT/ML IV SOLN
500.0000 [IU] | Freq: Every day | INTRAVENOUS | Status: AC | PRN
  Administered 2015-11-22: 500 [IU]
  Filled 2015-11-22: qty 5

## 2015-11-22 MED ORDER — LIDOCAINE-PRILOCAINE 2.5-2.5 % EX CREA: 1.0000 "application " | TOPICAL_CREAM | CUTANEOUS | Status: AC | PRN

## 2015-11-22 MED ORDER — SODIUM CHLORIDE 0.9 % IJ SOLN
10.0000 mL | INTRAMUSCULAR | Status: AC | PRN
  Administered 2015-11-22: 10 mL
  Filled 2015-11-22: qty 10

## 2015-11-22 MED ORDER — SODIUM CHLORIDE 0.9 % IV SOLN
250.0000 mL | Freq: Once | INTRAVENOUS | Status: AC
  Administered 2015-11-22: 250 mL via INTRAVENOUS

## 2015-11-22 MED ORDER — SODIUM CHLORIDE 0.9 % IJ SOLN
10.0000 mL | INTRAMUSCULAR | Status: DC | PRN
  Administered 2015-11-22: 10 mL via INTRAVENOUS
  Filled 2015-11-22: qty 10

## 2015-11-22 MED ORDER — HYDROMORPHONE HCL 2 MG PO TABS: 2.0000 mg | ORAL_TABLET | ORAL | Status: DC | PRN

## 2015-11-22 NOTE — Telephone Encounter (Signed)
Appointments made and avs will be printed in chemo,no avail time on 1/3 to do chemo same day

## 2015-11-22 NOTE — Progress Notes (Signed)
[facility] OFFICE PROGRESS NOTE   Diagnosis:  Pancreas cancer  INTERVAL HISTORY:    Ms. Kilgour returns as scheduled. She completed another treatment with gemcitabine/Abraxane on 10/23/2015. She reports mild nausea. She continues to have neuropathy symptoms in the hands and feet. She has difficulty holding objects. Her chief complaint is malaise. This has progressed. She stays in the house most of the time. Her appetite is poor.  Objective:  Vital signs in last 24 hours:  Blood pressure 124/87, pulse 113, temperature 97.3 F (36.3 C), temperature source Oral, resp. rate 20, height 5' 3.5" (1.613 m), weight 138 lb 12.8 oz (62.959 kg), last menstrual period 10/31/2005, SpO2 97 %.    HEENT:   Mild thrush at the right side of the tongue Resp:  Lungs clear bilaterally Cardio:  Regular rate and rhythm GI:  No hepatomegaly, distended with ascites, nontender Vascular:  Pitting edema throughout the left greater than right leg Neuro: moderate decrease in vibratory sense at the fingertips bilaterally   Portacath/PICC-without erythema  Lab Results:  Lab Results  Component Value Date   WBC 3.0* 11/22/2015   HGB 7.5* 11/22/2015   HCT 23.4* 11/22/2015   MCV 114.1* 11/22/2015   PLT 217 11/22/2015   NEUTROABS 2.1 11/22/2015    potassium 3.4, creatinine 0.8 , bilirubin 1.14, alk phosphatase 424, albumin 1.5  Medications: I have reviewed the patient's current medications.  Assessment/Plan: 1. Adenocarcinoma the pancreas, uncinate mass, EUS April 2015 revealed a uT3,uN1 lesion with an FNA biopsy confirming adenocarcinoma  MRI abdomen 07/04/2014 confirmed a pancreas uncinate 8 mass, 11 mm portacaval lymph node, no vascular involvement, and no evidence of distant metastatic disease   PET scan 07/20/2012 with a hypermetabolic uncinate process mass and hypermetabolic periportal lymph node   Markedly elevated CA 19-9   Negative diagnostic laparoscopy 07/24/2014    Initiation of Xeloda/radiation 08/10/2014.completed 09/21/2014.  CT 10/03/2014 confirmed a decrease in the pancreas mass, stable portacaval lymph node, and apparent involvement of the superior mesenteric artery  Pancreaticoduodenectomy on 11/02/2014 confirmed a poorly differentiated adenocarcinoma, ypT2,ypN0  Initiation of adjuvant gemcitabine 12/27/2014  Rising CA 19-07 February 2015  Left thoracentesis 03/08/2015 confirmed metastatic adenocarcinoma  CTs 03/16/2015 with a left pleural effusion and new right lung nodule  Cycle 1 FOLFIRINOX 03/21/2015  Cycle 2 FOLFIRINOX 04/04/2015  Cycle 3 FOLFIRINOX 04/18/2015  Cycle 4 FOLFIRINOX 05/02/2015  Cycle 5 FOLFIRINOX 05/16/2015 (oxaliplatin dose reduced secondary to thrombocytopenia)  Chemotherapy held 05/30/2015 due to thrombocytopenia and diarrhea.  Cycle 6 FOLFIRINOX 06/06/2015  Cycle 7 FOLFIRINOX 06/27/2015 (oxaliplatin and irinotecan dose reduced)  Chest x-ray 07/10/2015 with a stable small left pleural effusion.  CT 07/24/2015 with a decreased left pleural effusion and resolution of pulmonary nodules, severe fatty infiltration of the liver, new large volume ascites, and a right lower lung pulmonary embolism  Paracentesis 07/26/2015 confirmed metastatic adenocarcinoma  Cycle 1 gemcitabine/Abraxane 10/09/2015  Cycle 2 gemcitabine/Abraxane 10/23/2015  Cycle 3 gemcitabine/Abraxane 11/07/2015 2. Hypertension 3. Hyperlipidemia  4. Vaginal spotting summer 2015-evaluated by gynecology  5. Zoster rash at June 2015  6. Asthma  7. Family history of pancreas cancer 8. Nausea following the pancreaticoduodenectomy procedure. Improved. 9. Anemia secondary to chemotherapy , chronic disease, and malnutrition- progressive  10. Admission with a fever 02/09/2015-no source for infection identified, treated for presumptive pneumonia 11. Left pleural effusion. Slightly larger on follow-up chest x-ray 03/06/2015. Status post a  diagnostic/therapeutic thoracentesis 03/08/2015 cytology confirmed metastatic adenocarcinoma 12. Dysarthria/tongue numbness at the completion of oxaliplatin with cycle 1   FOLFIRINOX 03/21/2015-spontaneously resolved over hours, similar symptoms including diplopia following subsequent cycles of FOLFIRINOX 13. History of Hypokalemia-likely secondary to diarrhea, she is maintained on a potassium supplement 14. Thrombocytopenia secondary to chemotherapy-chemotherapy held 05/30/2015; marked thrombocytopenia (12,000) 06/12/2015, resolved 15. Epistaxis 06/12/2015 secondary to #14. Status post packing and follow up with ENT. Packing removed 06/18/2015. Recurrent epistaxis, status post cautery by ENT 10/16/2015 16. Right lower lobe pulmonary embolism noted on the CT 07/24/2015- xarelto started 07/25/2015 17. Jaundice-improved 18. 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 Ungerer has increased malaise and a diminished performance status. Her symptoms may be related to anemia versus progression of the pancreas cancer. We decided to hold chemotherapy today. She will be transfused with 2 units of packed red blood cells today.  The neuropathy is most likely related to oxaliplatin.   She will return for a therapeutic paracentesis on 11/23/2015. Ms. Latendresse will return for an office visit and planned chemotherapy on 12/03/2014. We will follow-up on the CA 19-9 from today.  SHERRILL, GARY, MD  11/22/2015  1:25 PM    

## 2015-11-22 NOTE — Patient Instructions (Signed)

## 2015-11-23 ENCOUNTER — Ambulatory Visit (HOSPITAL_COMMUNITY)
Admission: RE | Admit: 2015-11-23 | Discharge: 2015-11-23 | Disposition: A | Discharge status: Home / Self Care | Payer: Medicare Other | Source: Ambulatory Visit | Attending: Nurse Practitioner | Admitting: Nurse Practitioner

## 2015-11-23 ENCOUNTER — Ambulatory Visit: Payer: Medicare Other

## 2015-11-23 DIAGNOSIS — C259 Malignant neoplasm of pancreas, unspecified: Secondary | ICD-10-CM | POA: Diagnosis not present

## 2015-11-23 DIAGNOSIS — R188 Other ascites: Secondary | ICD-10-CM | POA: Insufficient documentation

## 2015-11-23 DIAGNOSIS — C250 Malignant neoplasm of head of pancreas: Secondary | ICD-10-CM

## 2015-11-23 LAB — TYPE AND SCREEN
ABO/RH(D): O POS
ANTIBODY SCREEN: NEGATIVE
UNIT DIVISION: 0
UNIT DIVISION: 0

## 2015-11-23 LAB — CANCER ANTIGEN 19-9: CA 19-9: 2367.1 U/mL — ABNORMAL HIGH (ref <35.0)

## 2015-11-23 NOTE — Procedures (Signed)
US guided therapeutic paracentesis performed yielding 5.1 L yellow fluid. No immediate complications.

## 2015-11-30 ENCOUNTER — Ambulatory Visit (HOSPITAL_COMMUNITY)
Admission: RE | Admit: 2015-11-30 | Discharge: 2015-11-30 | Disposition: A | Discharge status: Home / Self Care | Payer: Medicare Other | Source: Ambulatory Visit | Attending: Nurse Practitioner | Admitting: Nurse Practitioner

## 2015-11-30 DIAGNOSIS — C259 Malignant neoplasm of pancreas, unspecified: Secondary | ICD-10-CM | POA: Diagnosis not present

## 2015-11-30 DIAGNOSIS — C250 Malignant neoplasm of head of pancreas: Secondary | ICD-10-CM

## 2015-11-30 DIAGNOSIS — R180 Malignant ascites: Secondary | ICD-10-CM | POA: Diagnosis present

## 2015-11-30 NOTE — Procedures (Signed)
Successful US guided paracentesis from LLQ.  Yielded 5 liters of serous fluid.  No immediate complications.  Pt tolerated well.   Specimen was not sent for labs.  Tsosie Billing D PA-C 11/30/2015 9:44 AM

## 2015-12-02 ENCOUNTER — Other Ambulatory Visit: Payer: Self-pay | Admitting: Oncology

## 2015-12-03 ENCOUNTER — Other Ambulatory Visit: Payer: Self-pay | Admitting: Oncology

## 2015-12-04 ENCOUNTER — Ambulatory Visit (HOSPITAL_COMMUNITY)
Admission: RE | Admit: 2015-12-04 | Discharge: 2015-12-04 | Disposition: A | Discharge status: Home / Self Care | Payer: Medicare Other | Source: Ambulatory Visit | Attending: Nurse Practitioner | Admitting: Nurse Practitioner

## 2015-12-04 ENCOUNTER — Ambulatory Visit (HOSPITAL_BASED_OUTPATIENT_CLINIC_OR_DEPARTMENT_OTHER): Payer: Medicare Other | Admitting: Nurse Practitioner

## 2015-12-04 ENCOUNTER — Other Ambulatory Visit (HOSPITAL_BASED_OUTPATIENT_CLINIC_OR_DEPARTMENT_OTHER): Payer: Medicare Other

## 2015-12-04 ENCOUNTER — Other Ambulatory Visit: Payer: Self-pay | Admitting: *Deleted

## 2015-12-04 ENCOUNTER — Telehealth: Payer: Self-pay | Admitting: *Deleted

## 2015-12-04 ENCOUNTER — Telehealth: Payer: Self-pay | Admitting: Nurse Practitioner

## 2015-12-04 VITALS — BP 105/79 | HR 114 | Temp 97.4°F | Resp 17 | Ht 63.5 in | Wt 133.2 lb

## 2015-12-04 DIAGNOSIS — G609 Hereditary and idiopathic neuropathy, unspecified: Secondary | ICD-10-CM

## 2015-12-04 DIAGNOSIS — M160 Bilateral primary osteoarthritis of hip: Secondary | ICD-10-CM | POA: Insufficient documentation

## 2015-12-04 DIAGNOSIS — C250 Malignant neoplasm of head of pancreas: Secondary | ICD-10-CM

## 2015-12-04 DIAGNOSIS — M5136 Other intervertebral disc degeneration, lumbar region: Secondary | ICD-10-CM | POA: Insufficient documentation

## 2015-12-04 DIAGNOSIS — R109 Unspecified abdominal pain: Secondary | ICD-10-CM | POA: Diagnosis not present

## 2015-12-04 LAB — COMPREHENSIVE METABOLIC PANEL
ALBUMIN: 1.7 g/dL — AB (ref 3.5–5.0)
ALK PHOS: 530 U/L — AB (ref 40–150)
ALT: 22 U/L (ref 0–55)
ALT: 23 U/L (ref 0–55)
ALT: 22 U/L (ref 0–55)
ANION GAP: 5 meq/L (ref 3–11)
ANION GAP: 5 meq/L (ref 3–11)
AST: 46 U/L — AB (ref 5–34)
AST: 49 U/L — AB (ref 5–34)
AST: 46 U/L — ABNORMAL HIGH (ref 5–34)
Albumin: 1.8 g/dL — ABNORMAL LOW (ref 3.5–5.0)
Albumin: 1.7 g/dL — ABNORMAL LOW (ref 3.5–5.0)
Alkaline Phosphatase: 551 U/L — ABNORMAL HIGH (ref 40–150)
Alkaline Phosphatase: 530 U/L — ABNORMAL HIGH (ref 40–150)
Anion Gap: 5 mEq/L (ref 3–11)
BILIRUBIN TOTAL: 1.59 mg/dL — AB (ref 0.20–1.20)
BUN: 21.4 mg/dL (ref 7.0–26.0)
BUN: 22.2 mg/dL (ref 7.0–26.0)
BUN: 21.4 mg/dL (ref 7.0–26.0)
CALCIUM: 8.5 mg/dL (ref 8.4–10.4)
CHLORIDE: 111 meq/L — AB (ref 98–109)
CO2: 17 mEq/L — ABNORMAL LOW (ref 22–29)
CO2: 17 mEq/L — ABNORMAL LOW (ref 22–29)
CO2: 17 meq/L — AB (ref 22–29)
CREATININE: 1 mg/dL (ref 0.6–1.1)
Calcium: 8.1 mg/dL — ABNORMAL LOW (ref 8.4–10.4)
Calcium: 8.1 mg/dL — ABNORMAL LOW (ref 8.4–10.4)
Chloride: 111 mEq/L — ABNORMAL HIGH (ref 98–109)
Chloride: 113 mEq/L — ABNORMAL HIGH (ref 98–109)
Creatinine: 1 mg/dL (ref 0.6–1.1)
Creatinine: 1 mg/dL (ref 0.6–1.1)
EGFR: 57 mL/min/{1.73_m2} — ABNORMAL LOW (ref >90)
EGFR: 62 mL/min/{1.73_m2} — ABNORMAL LOW (ref >90)
EGFR: 62 mL/min/{1.73_m2} — AB (ref >90)
GLUCOSE: 99 mg/dL (ref 70–140)
Glucose: 104 mg/dl (ref 70–140)
Glucose: 99 mg/dl (ref 70–140)
Potassium: 5.1 mEq/L (ref 3.5–5.1)
Potassium: 5.1 mEq/L (ref 3.5–5.1)
SODIUM: 133 meq/L — AB (ref 136–145)
Sodium: 134 mEq/L — ABNORMAL LOW (ref 136–145)
Sodium: 133 mEq/L — ABNORMAL LOW (ref 136–145)
TOTAL PROTEIN: 6.7 g/dL (ref 6.4–8.3)
Total Bilirubin: 1.63 mg/dL — ABNORMAL HIGH (ref 0.20–1.20)
Total Bilirubin: 1.59 mg/dL — ABNORMAL HIGH (ref 0.20–1.20)
Total Protein: 6.9 g/dL (ref 6.4–8.3)
Total Protein: 6.7 g/dL (ref 6.4–8.3)

## 2015-12-04 LAB — CBC WITH DIFFERENTIAL/PLATELET
BASO%: 0.6 % (ref 0.0–2.0)
BASOS ABS: 0 10*3/uL (ref 0.0–0.1)
EOS ABS: 0 10*3/uL (ref 0.0–0.5)
EOS%: 0.3 % (ref 0.0–7.0)
HEMATOCRIT: 37.4 % (ref 34.8–46.6)
HEMOGLOBIN: 12.1 g/dL (ref 11.6–15.9)
LYMPH#: 0.3 10*3/uL — AB (ref 0.9–3.3)
LYMPH%: 4.1 % — ABNORMAL LOW (ref 14.0–49.7)
MCH: 34.8 pg — AB (ref 25.1–34.0)
MCHC: 32.2 g/dL (ref 31.5–36.0)
MCV: 108.1 fL — AB (ref 79.5–101.0)
MONO#: 0.5 10*3/uL (ref 0.1–0.9)
MONO%: 7.4 % (ref 0.0–14.0)
NEUT#: 6.2 10*3/uL (ref 1.5–6.5)
NEUT%: 87.6 % — AB (ref 38.4–76.8)
Platelets: 144 10*3/uL — ABNORMAL LOW (ref 145–400)
RBC: 3.46 10*6/uL — ABNORMAL LOW (ref 3.70–5.45)
RDW: 21.3 % — AB (ref 11.2–14.5)
WBC: 7 10*3/uL (ref 3.9–10.3)

## 2015-12-04 MED ORDER — ONDANSETRON HCL 8 MG PO TABS: ORAL_TABLET | ORAL | Status: DC

## 2015-12-04 NOTE — Progress Notes (Addendum)
[place] OFFICE PROGRESS NOTE   Diagnosis:   Pancreas cancer  INTERVAL HISTORY:   Sonya Larson returns as scheduled. She has completed 3 cycles of gemcitabine/Abraxane. Treatment was held 11/22/2015 due to malaise and a poor performance status. She was transfused 2 units of blood 11/22/2015.  She felt somewhat better after the blood transfusion. She continues to have malaise and has a poor appetite.  She estimates spending 80% of the day in bed. She is having increased nausea. No vomiting. She has had a recent change in bowel habits with constipation. Last bowel movement was 2 days ago. She describes the bowel movement as "normal". She has intermittent left-sided abdominal pain. Stable neuropathy symptoms in the hands and feet.   Objective:  Vital signs in last 24 hours:  Blood pressure 105/79, pulse 114, temperature 97.4 F (36.3 C), temperature source Oral, resp. rate 17, height 5' 3.5" (1.613 m), weight 133 lb 3.2 oz (60.419 kg), last menstrual period 10/31/2005, SpO2 100 %.    HEENT:  No thrush or ulcers. Resp:  Lungs clear bilaterally. Cardio:  Regular, mildly tachycardic. GI:  Abdomen is soft, distended. Ascites present. Vascular:  Pitting edema throughout both legs. Port-A-Cath without erythema.   Lab Results:  Lab Results  Component Value Date   WBC 7.0 12/04/2015   HGB 12.1 12/04/2015   HCT 37.4 12/04/2015   MCV 108.1* 12/04/2015   PLT 144* 12/04/2015   NEUTROABS 6.2 12/04/2015    Imaging:  No results found.  Medications: I have reviewed the patient's current medications.  Assessment/Plan: 1. Adenocarcinoma the pancreas, uncinate mass, EUS April 2015 revealed a uT3,uN1 lesion with an FNA biopsy confirming adenocarcinoma  MRI abdomen 07/04/2014 confirmed a pancreas uncinate 8 mass, 11 mm portacaval lymph node, no vascular involvement, and no evidence of distant metastatic disease   PET scan 07/20/2012 with a hypermetabolic uncinate process  mass and hypermetabolic periportal lymph node   Markedly elevated CA 19-9   Negative diagnostic laparoscopy 07/24/2014   Initiation of Xeloda/radiation 08/10/2014.completed 09/21/2014.  CT 10/03/2014 confirmed a decrease in the pancreas mass, stable portacaval lymph node, and apparent involvement of the superior mesenteric artery  Pancreaticoduodenectomy on 11/02/2014 confirmed a poorly differentiated adenocarcinoma, ypT2,ypN0  Initiation of adjuvant gemcitabine 12/27/2014  Rising CA 19-07 February 2015  Left thoracentesis 03/08/2015 confirmed metastatic adenocarcinoma  CTs 03/16/2015 with a left pleural effusion and new right lung nodule  Cycle 1 FOLFIRINOX 03/21/2015  Cycle 2 FOLFIRINOX 04/04/2015  Cycle 3 FOLFIRINOX 04/18/2015  Cycle 4 FOLFIRINOX 05/02/2015  Cycle 5 FOLFIRINOX 05/16/2015 (oxaliplatin dose reduced secondary to thrombocytopenia)  Chemotherapy held 05/30/2015 due to thrombocytopenia and diarrhea.  Cycle 6 FOLFIRINOX 06/06/2015  Cycle 7 FOLFIRINOX 06/27/2015 (oxaliplatin and irinotecan dose reduced)  Chest x-ray 07/10/2015 with a stable small left pleural effusion.  CT 07/24/2015 with a decreased left pleural effusion and resolution of pulmonary nodules, severe fatty infiltration of the liver, new large volume ascites, and a right lower lung pulmonary embolism  Paracentesis 07/26/2015 confirmed metastatic adenocarcinoma  Cycle 1 gemcitabine/Abraxane 10/09/2015  Cycle 2 gemcitabine/Abraxane 10/23/2015  Cycle 3 gemcitabine/Abraxane 11/07/2015  Cycle 4 held 11/22/2015 due to malaise, poor performance status  2. Hypertension 3. Hyperlipidemia  4. Vaginal spotting summer 2015-evaluated by gynecology  5. Zoster rash at June 2015  6. Asthma  7. Family history of pancreas cancer 8. Nausea following the pancreaticoduodenectomy procedure. Improved. 9. Anemia secondary to chemotherapy , chronic disease, and malnutrition- progressive  10. Admission  with a fever 02/09/2015-no source for  infection identified, treated for presumptive pneumonia 11. Left pleural effusion. Slightly larger on follow-up chest x-ray 03/06/2015. Status post a diagnostic/therapeutic thoracentesis 03/08/2015 cytology confirmed metastatic adenocarcinoma 12. Dysarthria/tongue numbness at the completion of oxaliplatin with cycle 1 FOLFIRINOX 03/21/2015-spontaneously resolved over hours, similar symptoms including diplopia following subsequent cycles of FOLFIRINOX 13. History of Hypokalemia-likely secondary to diarrhea, she is maintained on a potassium supplement 14. Thrombocytopenia secondary to chemotherapy-chemotherapy held 05/30/2015; marked thrombocytopenia (12,000) 06/12/2015, resolved 15. Epistaxis 06/12/2015 secondary to #14. Status post packing and follow up with ENT. Packing removed 06/18/2015. Recurrent epistaxis, status post cautery by ENT 10/16/2015 16. Right lower lobe pulmonary embolism noted on the CT 07/24/2015- xarelto started 07/25/2015 17. Jaundice-improved 18. 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 Severe hyperkalemia 12/04/2015.  21. Nausea, constipation 12/04/2015   Disposition: Sonya Larson has completed 3 cycles of gemcitabine/Abraxane. Cycle 4 was held on 11/22/2015 due to malaise/poor performance status. She continues to have malaise and a poor performance status. We are holding chemotherapy scheduled on 12/05/2015.  She has had recent nausea and constipation.  We are obtaining an abdominal x-ray today to evaluate for an obstruction. She was instructed on a soft/clear liquid diet and to begin a laxative regimen. She understands to contact the office if the constipation persists or she develops vomiting.  Potassium is markedly elevated on labs today. We are obtaining a stat repeat value. She will remain in the office until that lab result is available.  We scheduled a  return visit and possible chemotherapy in one week.  Patient seen with Dr. Benay Spice. 30 minutes were spent face-to-face at today's visit with the majority of that time involved in counseling/coordination of care.  Ned Card ANP/GNP-BC   12/04/2015  11:46 AM   Addendum-repeat potassium returned in upper normal range. She was instructed to discontinue oral potassium. Abdominal x-ray pending. We will contact her with the x-ray result.  This was a shared visit with Ned Card. Sonya Larson was interviewed and examined. Her performance status continues to decline. We decided to hold chemotherapy today. We are concerned she is developing obstructive symptoms. She will be referred for a plain x-ray of the abdomen.  Julieanne Manson, M.D.

## 2015-12-04 NOTE — Telephone Encounter (Signed)
I notified Sonya Larson the abdominal x-ray showed large stool burden in the right colon and transverse colon; nonobstructive bowel gas pattern. She will proceed with a laxative regimen. She understands to contact the office if this is not effective.

## 2015-12-04 NOTE — Telephone Encounter (Signed)
Per staff message and POF I have scheduled appts. Advised scheduler of appts. JMW  

## 2015-12-04 NOTE — Telephone Encounter (Signed)
per pof to sch pt appt-sent MW email to sch trmt-pt has MY chart and stated will look on that-adv pt Radiology willc all to sch [place]

## 2015-12-05 ENCOUNTER — Other Ambulatory Visit: Payer: Self-pay | Admitting: Nurse Practitioner

## 2015-12-05 ENCOUNTER — Ambulatory Visit: Payer: Medicare Other

## 2015-12-05 LAB — CANCER ANTIGEN 19-9: CA 19 9: 5296.1 U/mL — AB (ref <35.0)

## 2015-12-06 ENCOUNTER — Other Ambulatory Visit: Payer: Self-pay | Admitting: Nurse Practitioner

## 2015-12-06 ENCOUNTER — Encounter: Payer: Self-pay | Admitting: Oncology

## 2015-12-06 DIAGNOSIS — C250 Malignant neoplasm of head of pancreas: Secondary | ICD-10-CM

## 2015-12-06 NOTE — Progress Notes (Signed)
Faxed [employer] for prior auth ondansetron

## 2015-12-07 ENCOUNTER — Ambulatory Visit (HOSPITAL_COMMUNITY)
Admission: RE | Admit: 2015-12-07 | Discharge: 2015-12-07 | Disposition: A | Discharge status: Home / Self Care | Payer: Medicare Other | Source: Ambulatory Visit | Attending: Nurse Practitioner | Admitting: Nurse Practitioner

## 2015-12-07 DIAGNOSIS — C259 Malignant neoplasm of pancreas, unspecified: Secondary | ICD-10-CM | POA: Insufficient documentation

## 2015-12-07 DIAGNOSIS — R188 Other ascites: Secondary | ICD-10-CM | POA: Insufficient documentation

## 2015-12-07 DIAGNOSIS — C250 Malignant neoplasm of head of pancreas: Secondary | ICD-10-CM

## 2015-12-07 NOTE — Procedures (Signed)
Ultrasound-guided therapeutic paracentesis performed yielding 2.8 L yellow fluid. No immediate complications.

## 2015-12-11 ENCOUNTER — Other Ambulatory Visit: Payer: Self-pay | Admitting: Oncology

## 2015-12-11 ENCOUNTER — Telehealth: Payer: Self-pay

## 2015-12-11 ENCOUNTER — Telehealth: Payer: Self-pay | Admitting: Nurse Practitioner

## 2015-12-11 ENCOUNTER — Ambulatory Visit: Payer: Medicare Other

## 2015-12-11 ENCOUNTER — Ambulatory Visit (HOSPITAL_BASED_OUTPATIENT_CLINIC_OR_DEPARTMENT_OTHER): Payer: Medicare Other | Admitting: Nurse Practitioner

## 2015-12-11 ENCOUNTER — Encounter: Payer: Self-pay | Admitting: Nurse Practitioner

## 2015-12-11 ENCOUNTER — Encounter: Payer: Self-pay | Admitting: Oncology

## 2015-12-11 ENCOUNTER — Other Ambulatory Visit (HOSPITAL_BASED_OUTPATIENT_CLINIC_OR_DEPARTMENT_OTHER): Payer: Medicare Other

## 2015-12-11 VITALS — BP 113/86 | HR 112 | Temp 97.8°F | Resp 16 | Ht 63.5 in | Wt 130.4 lb

## 2015-12-11 DIAGNOSIS — C250 Malignant neoplasm of head of pancreas: Secondary | ICD-10-CM

## 2015-12-11 LAB — CBC WITH DIFFERENTIAL/PLATELET
BASO%: 0.1 % (ref 0.0–2.0)
Basophils Absolute: 0 10*3/uL (ref 0.0–0.1)
EOS%: 0.2 % (ref 0.0–7.0)
Eosinophils Absolute: 0 10*3/uL (ref 0.0–0.5)
HEMATOCRIT: 33.2 % — AB (ref 34.8–46.6)
HGB: 10.9 g/dL — ABNORMAL LOW (ref 11.6–15.9)
LYMPH%: 6.4 % — AB (ref 14.0–49.7)
MCH: 34.5 pg — ABNORMAL HIGH (ref 25.1–34.0)
MCHC: 32.8 g/dL (ref 31.5–36.0)
MCV: 105.1 fL — ABNORMAL HIGH (ref 79.5–101.0)
MONO#: 0.5 10*3/uL (ref 0.1–0.9)
MONO%: 5.7 % (ref 0.0–14.0)
NEUT%: 87.6 % — ABNORMAL HIGH (ref 38.4–76.8)
NEUTROS ABS: 7.6 10*3/uL — AB (ref 1.5–6.5)
Platelets: 162 10*3/uL (ref 145–400)
RBC: 3.16 10*6/uL — ABNORMAL LOW (ref 3.70–5.45)
RDW: 18.8 % — AB (ref 11.2–14.5)
WBC: 8.7 10*3/uL (ref 3.9–10.3)
lymph#: 0.6 10*3/uL — ABNORMAL LOW (ref 0.9–3.3)

## 2015-12-11 LAB — COMPREHENSIVE METABOLIC PANEL
ALT: 27 U/L (ref 0–55)
AST: 44 U/L — AB (ref 5–34)
Albumin: 1.8 g/dL — ABNORMAL LOW (ref 3.5–5.0)
Alkaline Phosphatase: 619 U/L — ABNORMAL HIGH (ref 40–150)
Anion Gap: 7 mEq/L (ref 3–11)
BILIRUBIN TOTAL: 1.4 mg/dL — AB (ref 0.20–1.20)
BUN: 29.3 mg/dL — AB (ref 7.0–26.0)
CALCIUM: 8.3 mg/dL — AB (ref 8.4–10.4)
CHLORIDE: 109 meq/L (ref 98–109)
CO2: 15 meq/L — AB (ref 22–29)
CREATININE: 1 mg/dL (ref 0.6–1.1)
EGFR: 59 mL/min/{1.73_m2} — ABNORMAL LOW (ref >90)
Glucose: 105 mg/dl (ref 70–140)
Potassium: 4.5 mEq/L (ref 3.5–5.1)
Sodium: 131 mEq/L — ABNORMAL LOW (ref 136–145)
TOTAL PROTEIN: 6.9 g/dL (ref 6.4–8.3)

## 2015-12-11 MED ORDER — HYDROMORPHONE HCL 2 MG PO TABS: 2.0000 mg | ORAL_TABLET | ORAL | Status: AC | PRN

## 2015-12-11 MED ORDER — SODIUM CHLORIDE 0.9 % IJ SOLN
10.0000 mL | INTRAMUSCULAR | Status: DC | PRN
Start: 2015-12-11 — End: 2015-12-11
  Administered 2015-12-11: 10 mL via INTRAVENOUS
  Filled 2015-12-11: qty 10

## 2015-12-11 MED ORDER — HEPARIN SOD (PORK) LOCK FLUSH 100 UNIT/ML IV SOLN
500.0000 [IU] | Freq: Once | INTRAVENOUS | Status: AC
  Administered 2015-12-11: 500 [IU] via INTRAVENOUS
  Filled 2015-12-11: qty 5

## 2015-12-11 NOTE — Progress Notes (Addendum)
[place] OFFICE PROGRESS NOTE   Diagnosis:  Pancreas cancer  INTERVAL HISTORY:   Sonya Larson returns as scheduled. She continues to have a poor energy level and poor appetite. She estimates spending 75% of the day in bed. Last bowel movement was 2 days ago. She discontinued Miralax. She feels that she likely needs to resume Miralax. The nausea she was experiencing at the time of her last visit improved with regular bowel movements. She now notes recurrent nausea.  Objective:  Vital signs in last 24 hours:  Blood pressure 113/86, pulse 112, temperature 97.8 F (36.6 C), temperature source Oral, resp. rate 16, height 5' 3.5" (1.613 m), weight 130 lb 6.4 oz (59.149 kg), last menstrual period 10/31/2005, SpO2 100 %.   Cachectic appearing. HEENT: No thrush or ulcers. Resp: Lungs clear bilaterally. Cardio: Regular, tachycardic. GI: Abdomen is soft, distended. Ascites present. Vascular: Pitting edema throughout both legs. Port-A-Cath without erythema.   Lab Results:  Lab Results  Component Value Date   WBC 8.7 12/11/2015   HGB 10.9* 12/11/2015   HCT 33.2* 12/11/2015   MCV 105.1* 12/11/2015   PLT 162 12/11/2015   NEUTROABS 7.6* 12/11/2015    Imaging:  No results found.  Medications: I have reviewed the patient's current medications.  Assessment/Plan: 1. Adenocarcinoma the pancreas, uncinate mass, EUS April 2015 revealed a uT3,uN1 lesion with an FNA biopsy confirming adenocarcinoma  MRI abdomen 07/04/2014 confirmed a pancreas uncinate 8 mass, 11 mm portacaval lymph node, no vascular involvement, and no evidence of distant metastatic disease   PET scan 07/20/2012 with a hypermetabolic uncinate process mass and hypermetabolic periportal lymph node   Markedly elevated CA 19-9   Negative diagnostic laparoscopy 07/24/2014   Initiation of Xeloda/radiation 08/10/2014.completed 09/21/2014.  CT 10/03/2014 confirmed a decrease in the pancreas mass, stable  portacaval lymph node, and apparent involvement of the superior mesenteric artery  Pancreaticoduodenectomy on 11/02/2014 confirmed a poorly differentiated adenocarcinoma, ypT2,ypN0  Initiation of adjuvant gemcitabine 12/27/2014  Rising CA 19-07 February 2015  Left thoracentesis 03/08/2015 confirmed metastatic adenocarcinoma  CTs 03/16/2015 with a left pleural effusion and new right lung nodule  Cycle 1 FOLFIRINOX 03/21/2015  Cycle 2 FOLFIRINOX 04/04/2015  Cycle 3 FOLFIRINOX 04/18/2015  Cycle 4 FOLFIRINOX 05/02/2015  Cycle 5 FOLFIRINOX 05/16/2015 (oxaliplatin dose reduced secondary to thrombocytopenia)  Chemotherapy held 05/30/2015 due to thrombocytopenia and diarrhea.  Cycle 6 FOLFIRINOX 06/06/2015  Cycle 7 FOLFIRINOX 06/27/2015 (oxaliplatin and irinotecan dose reduced)  Chest x-ray 07/10/2015 with a stable small left pleural effusion.  CT 07/24/2015 with a decreased left pleural effusion and resolution of pulmonary nodules, severe fatty infiltration of the liver, new large volume ascites, and a right lower lung pulmonary embolism  Paracentesis 07/26/2015 confirmed metastatic adenocarcinoma  Cycle 1 gemcitabine/Abraxane 10/09/2015  Cycle 2 gemcitabine/Abraxane 10/23/2015  Cycle 3 gemcitabine/Abraxane 11/07/2015  Cycle 4 held 11/22/2015 due to malaise, poor performance status  Chemotherapy held 12/05/2015 due to malaise, poor performance status  2. Hypertension 3. Hyperlipidemia  4. Vaginal spotting summer 2015-evaluated by gynecology  5. Zoster rash at June 2015  6. Asthma  7. Family history of pancreas cancer 8. Nausea following the pancreaticoduodenectomy procedure. Improved. 9. Anemia secondary to chemotherapy , chronic disease, and malnutrition- progressive  10. Admission with a fever 02/09/2015-no source for infection identified, treated for presumptive pneumonia 11. Left pleural effusion. Slightly larger on follow-up chest x-ray 03/06/2015. Status post a  diagnostic/therapeutic thoracentesis 03/08/2015 cytology confirmed metastatic adenocarcinoma 12. Dysarthria/tongue numbness at the completion of oxaliplatin with cycle 1  FOLFIRINOX 03/21/2015-spontaneously resolved over hours, similar symptoms including diplopia following subsequent cycles of FOLFIRINOX 13. History of Hypokalemia-likely secondary to diarrhea, she is maintained on a potassium supplement 14. Thrombocytopenia secondary to chemotherapy-chemotherapy held 05/30/2015; marked thrombocytopenia (12,000) 06/12/2015, resolved 15. Epistaxis 06/12/2015 secondary to #14. Status post packing and follow up with ENT. Packing removed 06/18/2015. Recurrent epistaxis, status post cautery by ENT 10/16/2015 16. Right lower lobe pulmonary embolism noted on the CT 07/24/2015- xarelto started 07/25/2015 17. Jaundice-improved 18. 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 Severe hyperkalemia 12/04/2015. Repeat lab showed potassium in normal range. 21. Nausea, constipation 12/04/2015. Abdominal x-ray with large stool burden. No signs of obstruction.   Disposition: Ms. Moerman continues to have a poor performance status. She and her family understand she is not a candidate for further chemotherapy in her current condition. Dr. Benay Spice recommends a supportive care approach. We discussed a referral to the [facility] program. They are in agreement. We discussed end-of-life issues. She will be placed on NO CODE BLUE status per her wishes. Plan to continue weekly paracentesis procedures for comfort.  She will return for a follow-up visit in approximately 3 weeks. She or her family will contact the office in the interim with any problems.  Patient seen with Dr. Benay Spice. 30 minutes were spent face-to-face at today's visit with the majority of that time involved in counseling/coordination of care.    Ned Card ANP/GNP-BC    12/11/2015  10:15 AM  This was a shared visit with Ned Card. Ms. Whitcomb has a declining performance status. She is not a candidate for further chemotherapy. She agrees to a [facility] referral. He discussed CPR and ACLS issues. She will be placed on a no CODE BLUE status.  She will continue palliative paracentesis procedures as needed.  Julieanne Manson, M.D.

## 2015-12-11 NOTE — Telephone Encounter (Signed)
As per [place] referral called in to [facility] for Mrs. Schlader

## 2015-12-11 NOTE — Progress Notes (Signed)
Per [employer] ondansetron approved 11/30/15-11/30/16 [id number]. I sent to medical records.

## 2015-12-11 NOTE — Telephone Encounter (Signed)
Mailed letter to patient. Made patient aware of upcoming appointment.       [facility]. °

## 2015-12-13 ENCOUNTER — Telehealth: Payer: Self-pay | Admitting: Nurse Practitioner

## 2015-12-13 ENCOUNTER — Ambulatory Visit (HOSPITAL_COMMUNITY)
Admission: RE | Admit: 2015-12-13 | Discharge: 2015-12-13 | Disposition: A | Discharge status: Home / Self Care | Payer: Medicare Other | Source: Ambulatory Visit | Attending: Nurse Practitioner | Admitting: Nurse Practitioner

## 2015-12-13 DIAGNOSIS — C250 Malignant neoplasm of head of pancreas: Secondary | ICD-10-CM | POA: Diagnosis not present

## 2015-12-13 DIAGNOSIS — R180 Malignant ascites: Secondary | ICD-10-CM | POA: Diagnosis not present

## 2015-12-13 NOTE — Procedures (Signed)
Ultrasound-guided therapeutic paracentesis performed yielding 2.1 L slightly hazy, yellow fluid. No immediate complications.

## 2015-12-13 NOTE — Telephone Encounter (Signed)
inbox to lisa to add orders for paracentesis 1/26 and 2/2

## 2015-12-14 ENCOUNTER — Telehealth: Payer: Self-pay | Admitting: *Deleted

## 2015-12-14 DIAGNOSIS — C250 Malignant neoplasm of head of pancreas: Secondary | ICD-10-CM

## 2015-12-14 MED ORDER — ONDANSETRON HCL 8 MG PO TABS: ORAL_TABLET | ORAL | Status: DC

## 2015-12-14 MED ORDER — LORAZEPAM 0.5 MG PO TABS: 0.5000 mg | ORAL_TABLET | Freq: Four times a day (QID) | ORAL | Status: AC | PRN

## 2015-12-14 NOTE — Telephone Encounter (Addendum)
Reviewed earlier call with Dr. Benay Spice: OK to order O2 for PRN use. De Burrs with order. Ativan and Zofran Rx faxed to [place].

## 2015-12-14 NOTE — Addendum Note (Signed)
Addended by: Brien Few on: 12/14/2015 05:51 PM   Modules accepted: Orders

## 2015-12-14 NOTE — Telephone Encounter (Signed)
Hall Busing called back.  There is no DNR form at the home.  They will have Hospice Physician sign and take to the home.

## 2015-12-14 NOTE — Telephone Encounter (Signed)
Hall Busing RN with Hospice - just admitted patient to Hospice services and she has a few questions. 1.  Patient needs a new script for zofran and ativan faxed to [facility] in [place] with 'HOSPICE PATIENT' on top of script.  2.  Patient is requesting oxygen for prn use @ 2L/min.  (O2 sats are in the high 90's - but hospice will cover if there is a physcian order). Please call Helene Kelp @ [phone].  She is getting ready to order equipment and would like to do this all together.  She will need to know if Dr. Benay Spice is willing to order this.

## 2015-12-18 ENCOUNTER — Encounter: Payer: Self-pay | Admitting: Oncology

## 2015-12-18 NOTE — Progress Notes (Signed)
I placed form for dr. Benay Spice for ondansetron. It has been approved but more needed from insurance

## 2015-12-19 ENCOUNTER — Telehealth: Payer: Self-pay | Admitting: *Deleted

## 2015-12-19 ENCOUNTER — Encounter: Payer: Self-pay | Admitting: Oncology

## 2015-12-19 NOTE — Telephone Encounter (Signed)
Per Dr. Benay Spice; notified Ria Comment, RN Hospice Dr. Benay Spice in agreement with DNR; pt can stop Xarelto; no new med for neuropathy unless painful.  Ria Comment reports pt says the neuropathy is painful but is only taking her Dilaudid twice a day.  Instruction given to increase pain med as prescribed and see if that helps neuropathy.  Regarding life expectancy--MD estimates possible few months.  Ria Comment verbalized understanding of all information and states she will relay to pt.

## 2015-12-19 NOTE — Progress Notes (Signed)
I faxed notes/labs to [facility] [phone]

## 2015-12-19 NOTE — Telephone Encounter (Signed)
See previous note

## 2015-12-19 NOTE — Telephone Encounter (Signed)
Ria Comment, RN from Hospice called. Saw patient for first time yesterday.  1. Pt is requesting DNR- needs verbal from Dr Benay Spice for [facility] and they will have Hospice MD sign.  2. Pt is taking xarelto- having nose bleeds. Is it OK to stop?  3. Biggest complaint is neuropathy in feet and hands. Is there anything we can give her?  Ria Comment call back # [phone]

## 2015-12-19 NOTE — Telephone Encounter (Signed)
Hospice nurse called back to say patient is also asking about life expectancy-time.Sonya KitchenMarland Larson

## 2015-12-20 ENCOUNTER — Ambulatory Visit (HOSPITAL_COMMUNITY)
Admission: RE | Admit: 2015-12-20 | Discharge: 2015-12-20 | Disposition: A | Discharge status: Home / Self Care | Source: Ambulatory Visit | Attending: Nurse Practitioner | Admitting: Nurse Practitioner

## 2015-12-20 ENCOUNTER — Telehealth: Payer: Self-pay | Admitting: *Deleted

## 2015-12-20 ENCOUNTER — Other Ambulatory Visit: Payer: Self-pay | Admitting: *Deleted

## 2015-12-20 DIAGNOSIS — C250 Malignant neoplasm of head of pancreas: Secondary | ICD-10-CM | POA: Diagnosis not present

## 2015-12-20 DIAGNOSIS — R180 Malignant ascites: Secondary | ICD-10-CM | POA: Insufficient documentation

## 2015-12-20 IMAGING — US US PARACENTESIS
1 series · 6 of 6 positions shown · non-contrast
Comparison: Prior paracentesis on 08/31/2015

MEDICATIONS:
None.

COMPLICATIONS:
None immediate

INDICATION: Pancreatic cancer, recurrent ascites. Request is made for
therapeutic paracentesis.

EXAM:
ULTRASOUND-GUIDED THERAPEUTIC PARACENTESIS
TECHNIQUE: Informed written consent was obtained from the patient after a
discussion of the risks, benefits and alternatives to treatment. A
timeout was performed prior to the initiation of the procedure.

[Series 1: us paracentesis · 0.27mm/px · 6 of 6 slices shown]
[im 1/6]
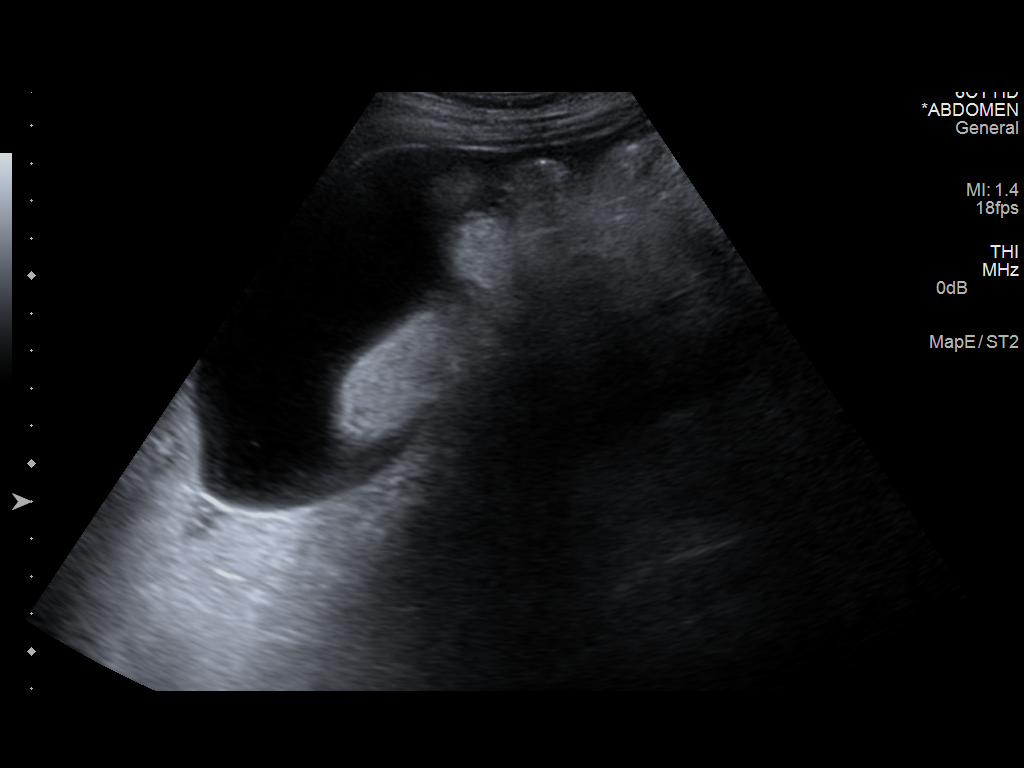
[im 2/6]
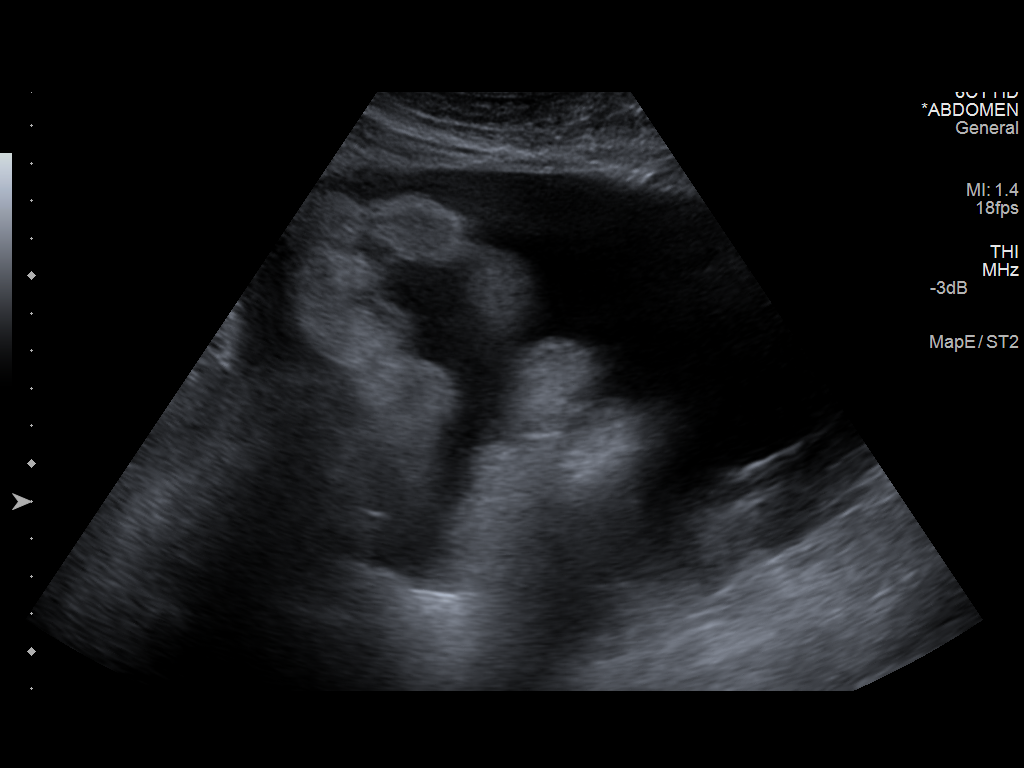
[im 3/6]
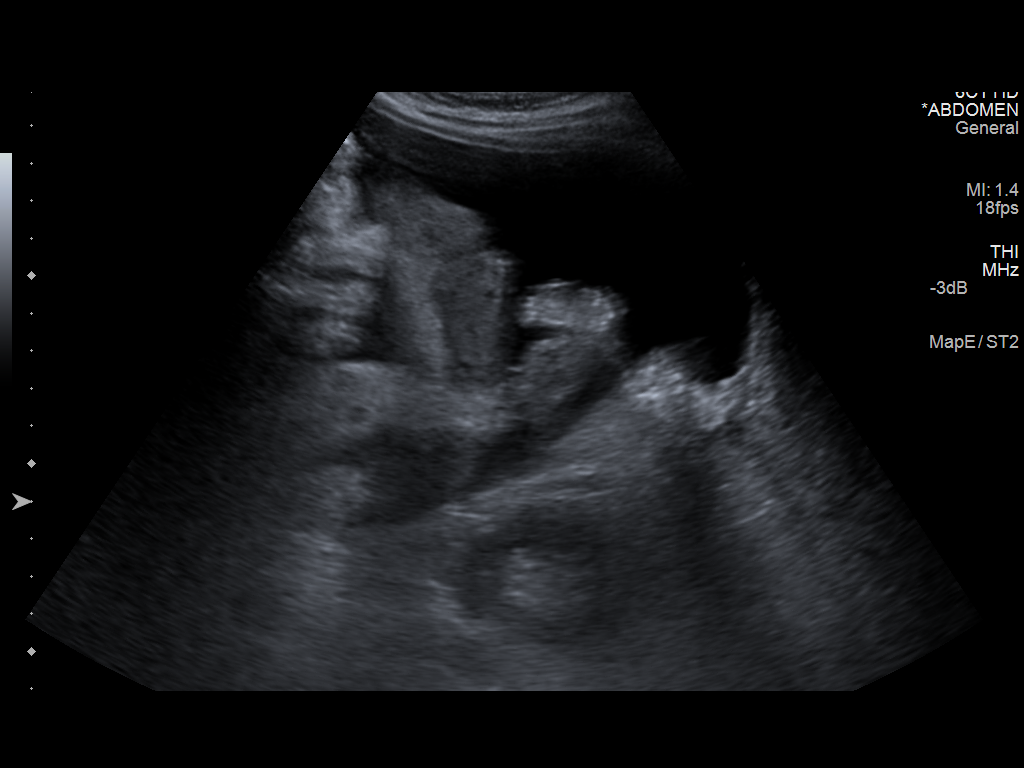
[im 4/6]
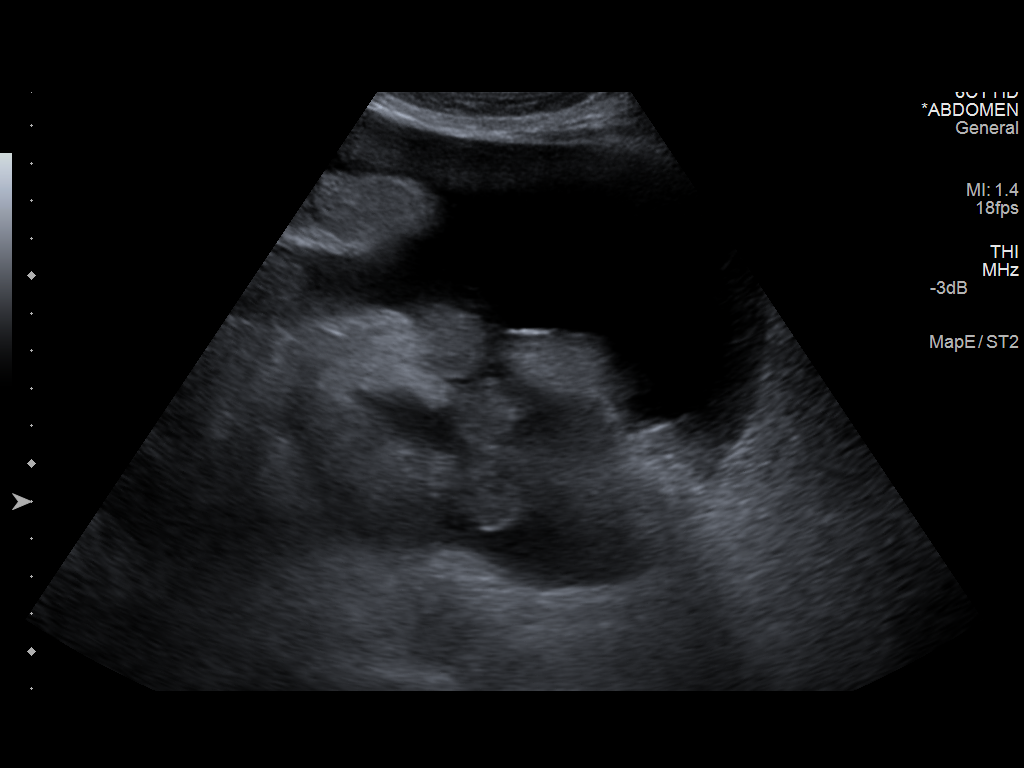
[im 5/6]
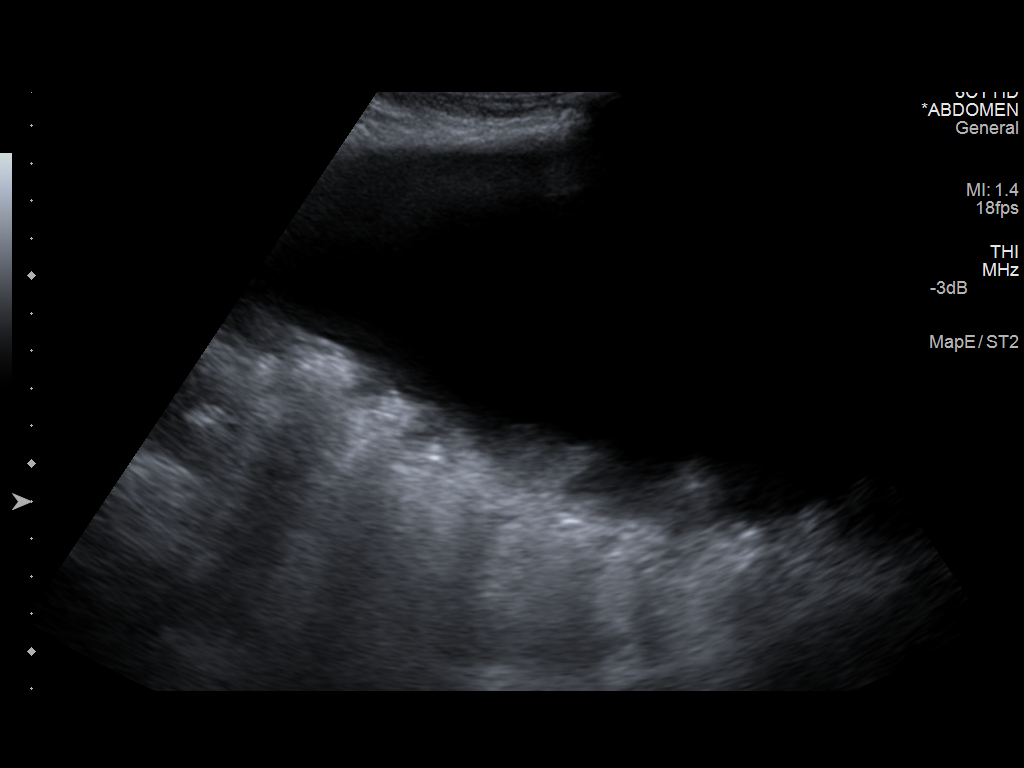
[im 6/6]
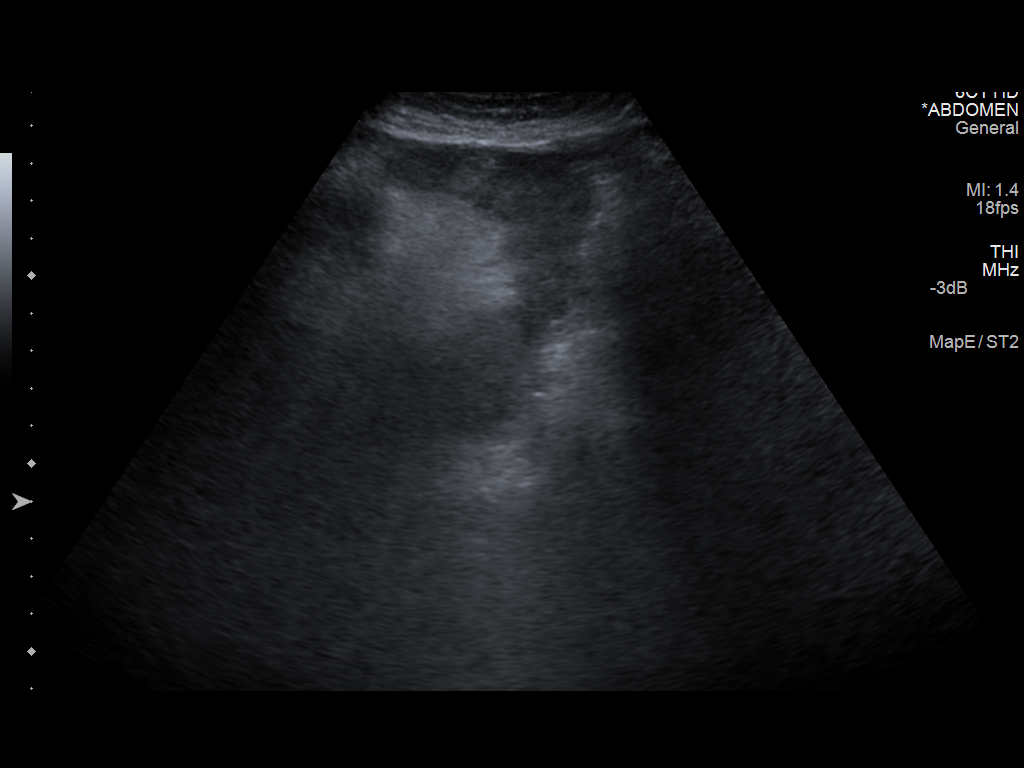

[6 of 6 positions shown; findings below may reference images not displayed]

Initial ultrasound scanning demonstrates a large amount of ascites
within the left lower abdominal quadrant. The left lower abdomen was
prepped and draped in the usual sterile fashion. 1% lidocaine was
used for local anesthesia. Under direct ultrasound guidance, a 19
gauge, 10-cm, Yueh catheter was introduced. An ultrasound image was
saved for documentation purposed. The paracentesis was performed.
The catheter was removed and a dressing was applied. The patient
tolerated the procedure well without immediate post procedural
complication.
FINDINGS: A total of approximately 5.3 liters of yellow fluid was removed.
IMPRESSION: Successful ultrasound-guided therapeutic paracentesis yielding
liters of peritoneal fluid.

## 2015-12-20 NOTE — Procedures (Signed)
Ultrasound-guided therapeutic paracentesis performed yielding 2.5  liters of hazy, amber colored fluid. No immediate complications.

## 2015-12-20 NOTE — Telephone Encounter (Signed)
Husband called stating that pt just had paracentesis done this am.  Pt was supposed to have 2 more paracentesis procedures before next office visit with Dr. Benay Spice.  Lavella Lemons, desk nurse notified.  OK to place orders as per desk nurse.  This triage nurse placed orders in EPIC.  Spoke with husband, and informed him of same info.  Instructed husband to call radiology central scheduling to schedule future appts for paracentesis.  Husband voiced understanding. [facility]   Phone    [phone].

## 2015-12-21 ENCOUNTER — Other Ambulatory Visit: Payer: Self-pay | Admitting: Nurse Practitioner

## 2015-12-21 DIAGNOSIS — R180 Malignant ascites: Secondary | ICD-10-CM

## 2015-12-21 DIAGNOSIS — C250 Malignant neoplasm of head of pancreas: Secondary | ICD-10-CM

## 2015-12-23 ENCOUNTER — Other Ambulatory Visit: Payer: Self-pay | Admitting: Oncology

## 2015-12-27 ENCOUNTER — Other Ambulatory Visit: Payer: Self-pay | Admitting: Oncology

## 2015-12-27 ENCOUNTER — Ambulatory Visit (HOSPITAL_COMMUNITY)
Admission: RE | Admit: 2015-12-27 | Discharge: 2015-12-27 | Disposition: A | Discharge status: Home / Self Care | Source: Ambulatory Visit | Attending: Oncology | Admitting: Oncology

## 2015-12-27 DIAGNOSIS — C250 Malignant neoplasm of head of pancreas: Secondary | ICD-10-CM

## 2016-01-02 ENCOUNTER — Other Ambulatory Visit: Payer: Self-pay | Admitting: *Deleted

## 2016-01-02 ENCOUNTER — Telehealth: Payer: Self-pay | Admitting: *Deleted

## 2016-01-02 NOTE — Telephone Encounter (Signed)
Called pt's husband, informed him no labs needed for 2/2. Come in for 0900 office visit. He voiced understanding.

## 2016-01-03 ENCOUNTER — Telehealth: Payer: Self-pay | Admitting: *Deleted

## 2016-01-03 ENCOUNTER — Other Ambulatory Visit: Payer: Medicare Other

## 2016-01-03 ENCOUNTER — Telehealth: Payer: Self-pay | Admitting: Oncology

## 2016-01-03 ENCOUNTER — Ambulatory Visit (HOSPITAL_BASED_OUTPATIENT_CLINIC_OR_DEPARTMENT_OTHER): Admitting: Oncology

## 2016-01-03 ENCOUNTER — Ambulatory Visit (HOSPITAL_COMMUNITY)
Admission: RE | Admit: 2016-01-03 | Discharge: 2016-01-03 | Disposition: A | Discharge status: Home / Self Care | Source: Ambulatory Visit | Attending: Oncology | Admitting: Oncology

## 2016-01-03 VITALS — BP 89/61 | HR 112 | Temp 97.8°F | Resp 17 | Wt 125.4 lb

## 2016-01-03 DIAGNOSIS — R180 Malignant ascites: Secondary | ICD-10-CM | POA: Insufficient documentation

## 2016-01-03 DIAGNOSIS — C250 Malignant neoplasm of head of pancreas: Secondary | ICD-10-CM

## 2016-01-03 DIAGNOSIS — R188 Other ascites: Secondary | ICD-10-CM | POA: Diagnosis present

## 2016-01-03 DIAGNOSIS — D6481 Anemia due to antineoplastic chemotherapy: Secondary | ICD-10-CM

## 2016-01-03 NOTE — Progress Notes (Signed)
Sonya Larson   Diagnosis: Pancreas cancer  INTERVAL HISTORY:   Sonya Larson returns as scheduled. She is enrolled in the [facility] program. The hospice nurse is visiting weekly. She continues intermittent palliative paracentesis procedures. There was not enough ascites present for a paracentesis 12/27/2015.  She reports leg swelling and pain. She is not walking much. Her oral intake is variable.  Objective:  Vital signs in last 24 hours:  Blood pressure 89/61, pulse 112, temperature 97.8 F (36.6 C), temperature source Oral, resp. rate 17, weight 125 lb 6.4 oz (56.881 kg), last menstrual period 10/31/2005, SpO2 100 %.   Resp: Lungs clear bilaterally Cardio: Regular rate and rhythm GI: Mildly distended, nontender Vascular: Pitting edema throughout the legs bilaterally Neuro: Alert and oriented     Portacath/PICC-without erythema  Lab Results:  Lab Results  Component Value Date   WBC 8.7 12/11/2015   HGB 10.9* 12/11/2015   HCT 33.2* 12/11/2015   MCV 105.1* 12/11/2015   PLT 162 12/11/2015   NEUTROABS 7.6* 12/11/2015    Medications: I have reviewed the patient's current medications.  Assessment/Plan: 1. Adenocarcinoma the pancreas, uncinate mass, EUS April 2015 revealed a uT3,uN1 lesion with an FNA biopsy confirming adenocarcinoma  MRI abdomen 07/04/2014 confirmed a pancreas uncinate 8 mass, 11 mm portacaval lymph node, no vascular involvement, and no evidence of distant metastatic disease   PET scan 07/20/2012 with a hypermetabolic uncinate process mass and hypermetabolic periportal lymph node   Markedly elevated CA 19-9   Negative diagnostic laparoscopy 07/24/2014   Initiation of Xeloda/radiation 08/10/2014.completed 09/21/2014.  CT 10/03/2014 confirmed a decrease in the pancreas mass, stable portacaval lymph node, and apparent involvement of the superior mesenteric artery  Pancreaticoduodenectomy on 11/02/2014  confirmed a poorly differentiated adenocarcinoma, ypT2,ypN0  Initiation of adjuvant gemcitabine 12/27/2014  Rising CA 19-07 February 2015  Left thoracentesis 03/08/2015 confirmed metastatic adenocarcinoma  CTs 03/16/2015 with a left pleural effusion and new right lung nodule  Cycle 1 FOLFIRINOX 03/21/2015  Cycle 2 FOLFIRINOX 04/04/2015  Cycle 3 FOLFIRINOX 04/18/2015  Cycle 4 FOLFIRINOX 05/02/2015  Cycle 5 FOLFIRINOX 05/16/2015 (oxaliplatin dose reduced secondary to thrombocytopenia)  Chemotherapy held 05/30/2015 due to thrombocytopenia and diarrhea.  Cycle 6 FOLFIRINOX 06/06/2015  Cycle 7 FOLFIRINOX 06/27/2015 (oxaliplatin and irinotecan dose reduced)  Chest x-ray 07/10/2015 with a stable small left pleural effusion.  CT 07/24/2015 with a decreased left pleural effusion and resolution of pulmonary nodules, severe fatty infiltration of the liver, new large volume ascites, and a right lower lung pulmonary embolism  Paracentesis 07/26/2015 confirmed metastatic adenocarcinoma  Cycle 1 gemcitabine/Abraxane 10/09/2015  Cycle 2 gemcitabine/Abraxane 10/23/2015  Cycle 3 gemcitabine/Abraxane 11/07/2015  Cycle 4 held 11/22/2015 due to malaise, poor performance status  Chemotherapy held 12/05/2015 due to malaise, poor performance status  Enrolled in the [place] hospice program  2. Hypertension 3. Hyperlipidemia  4. Vaginal spotting summer 2015-evaluated by gynecology  5. Zoster rash at June 2015  6. Asthma  7. Family history of pancreas cancer 8. Nausea following the pancreaticoduodenectomy procedure. Improved. 9. Anemia secondary to chemotherapy , chronic disease, and malnutrition- progressive  10. Admission with a fever 02/09/2015-no source for infection identified, treated for presumptive pneumonia 11. Left pleural effusion. Slightly larger on follow-up chest x-ray 03/06/2015. Status post a diagnostic/therapeutic thoracentesis 03/08/2015 cytology confirmed metastatic  adenocarcinoma 12. Dysarthria/tongue numbness at the completion of oxaliplatin with cycle 1 FOLFIRINOX 03/21/2015-spontaneously resolved over hours, similar symptoms including diplopia following subsequent cycles of FOLFIRINOX 13. History of Hypokalemia-likely secondary to  diarrhea, she is maintained on a potassium supplement 14. Thrombocytopenia secondary to chemotherapy-chemotherapy held 05/30/2015; marked thrombocytopenia (12,000) 06/12/2015, resolved 15. Epistaxis 06/12/2015 secondary to #14. Status post packing and follow up with ENT. Packing removed 06/18/2015. Recurrent epistaxis, status post cautery by ENT 10/16/2015 16. Right lower lobe pulmonary embolism noted on the CT 07/24/2015- xarelto started 07/25/2015 17. Jaundice-improved 18. 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 Severe hyperkalemia 12/04/2015. Repeat lab showed potassium in normal range. 21. Nausea, constipation 12/04/2015. Abdominal x-ray with large stool burden. No signs of obstruction.   Disposition:  Sonya Larson has metastatic pancreas cancer. Her performance status is declining. She will continue palliative paracentesis procedures as needed and follow up with the [facility] program. She will return for an office visit in 3 weeks.  Betsy Coder, MD  01/03/2016  9:17 AM

## 2016-01-03 NOTE — Telephone Encounter (Signed)
Spoke with Sonya Larson in radiology. Order given to limit paracentesis to 2L due to hypotension today. She will relay message to ultrasound.

## 2016-01-03 NOTE — Telephone Encounter (Signed)
Scheduled patient appt per pof, avs report printed.  °

## 2016-01-03 NOTE — Procedures (Signed)
Ultrasound-guided therapeutic paracentesis performed yielding 1 liters of serosanguinous colored fluid. No immediate complications.  Sonya Larson E 11:43 AM 01/03/2016

## 2016-01-04 ENCOUNTER — Other Ambulatory Visit: Payer: Self-pay | Admitting: *Deleted

## 2016-01-04 DIAGNOSIS — C250 Malignant neoplasm of head of pancreas: Secondary | ICD-10-CM

## 2016-01-04 MED ORDER — ONDANSETRON HCL 8 MG PO TABS: ORAL_TABLET | ORAL | Status: AC

## 2016-01-04 MED ORDER — MIRTAZAPINE 15 MG PO TABS
15.0000 mg | ORAL_TABLET | Freq: Every day | ORAL | Status: AC
Start: 2016-01-04 — End: ?

## 2016-01-04 NOTE — Telephone Encounter (Signed)
Call from pt's husband requesting Remeron and Zofran refill. Rx called in to [place].

## 2016-01-10 ENCOUNTER — Ambulatory Visit (HOSPITAL_COMMUNITY): Payer: Medicare Other

## 2016-01-11 ENCOUNTER — Other Ambulatory Visit: Payer: Self-pay | Admitting: *Deleted

## 2016-01-11 MED ORDER — MORPHINE SULFATE (CONCENTRATE) 20 MG/ML PO SOLN: ORAL | Status: AC

## 2016-01-11 NOTE — Telephone Encounter (Signed)
Sonya Larson, [place] reports "pt is nearing end of life; has not ate in 3 days and only fluid is with her pills; pt not comfortably resting; very restless; not really conscious"  Sonya Larson requesting liquid pain med d/t pt having difficulty swallowing.  Per Dr. Benay Spice, left message for Sonya Ryder, RN that Roxanol script faxed to [place] per request.

## 2016-01-14 ENCOUNTER — Telehealth: Payer: Self-pay | Admitting: *Deleted

## 2016-01-17 ENCOUNTER — Telehealth: Payer: Self-pay | Admitting: Oncology

## 2016-01-17 ENCOUNTER — Ambulatory Visit (HOSPITAL_COMMUNITY): Payer: Medicare Other

## 2016-01-17 NOTE — Telephone Encounter (Signed)
Contacted [employer] manager that certificate was ready for pick up and faxed the sign copy

## 2016-01-24 ENCOUNTER — Encounter

## 2016-01-24 ENCOUNTER — Ambulatory Visit: Admitting: Oncology

## 2016-01-24 ENCOUNTER — Ambulatory Visit (HOSPITAL_COMMUNITY): Payer: Medicare Other

## 2016-01-30 NOTE — Telephone Encounter (Signed)
Message from Becky Augusta, Hospice RN reporting pt expired this morning at 0933. Message forwarded to Dr. Benay Spice.

## 2016-01-30 DEATH — deceased

## 2016-01-31 ENCOUNTER — Ambulatory Visit (HOSPITAL_COMMUNITY): Payer: Medicare Other

## 2016-03-18 ENCOUNTER — Encounter: Payer: Self-pay | Admitting: Oncology

## 2016-03-18 NOTE — Progress Notes (Signed)
Forms faxed  [phone] I sent to medical records

## 2016-04-02 IMAGING — CR DG CHEST 2V
2 series · 2 of 2 positions shown · non-contrast
Comparison: Chest x-ray of 04/18/2015

CLINICAL DATA: Followup of right pleural effusion, history of
adenocarcinoma of the head of the pancreas

EXAM:
CHEST  2 VIEW

[w chest pa]
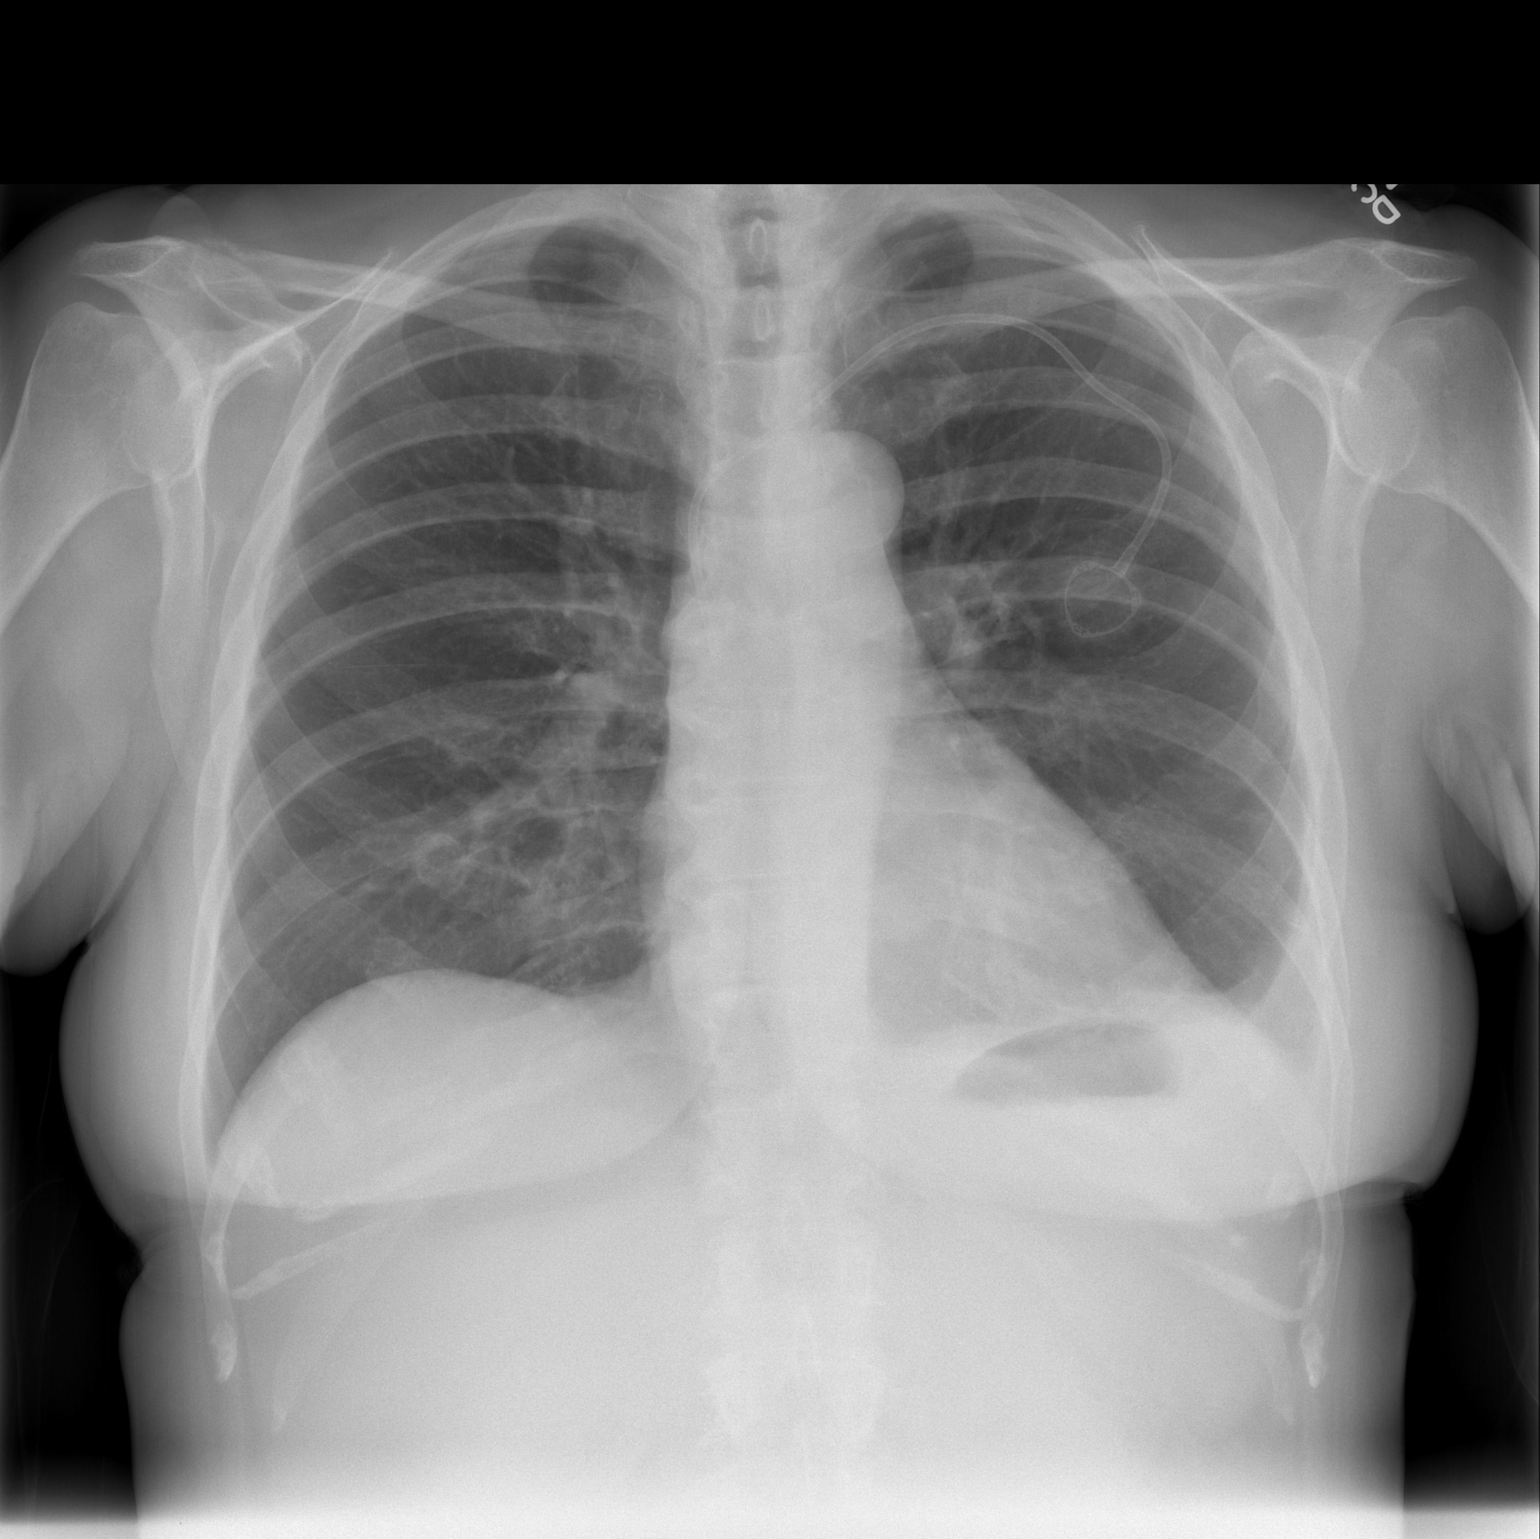

[w chest lat]
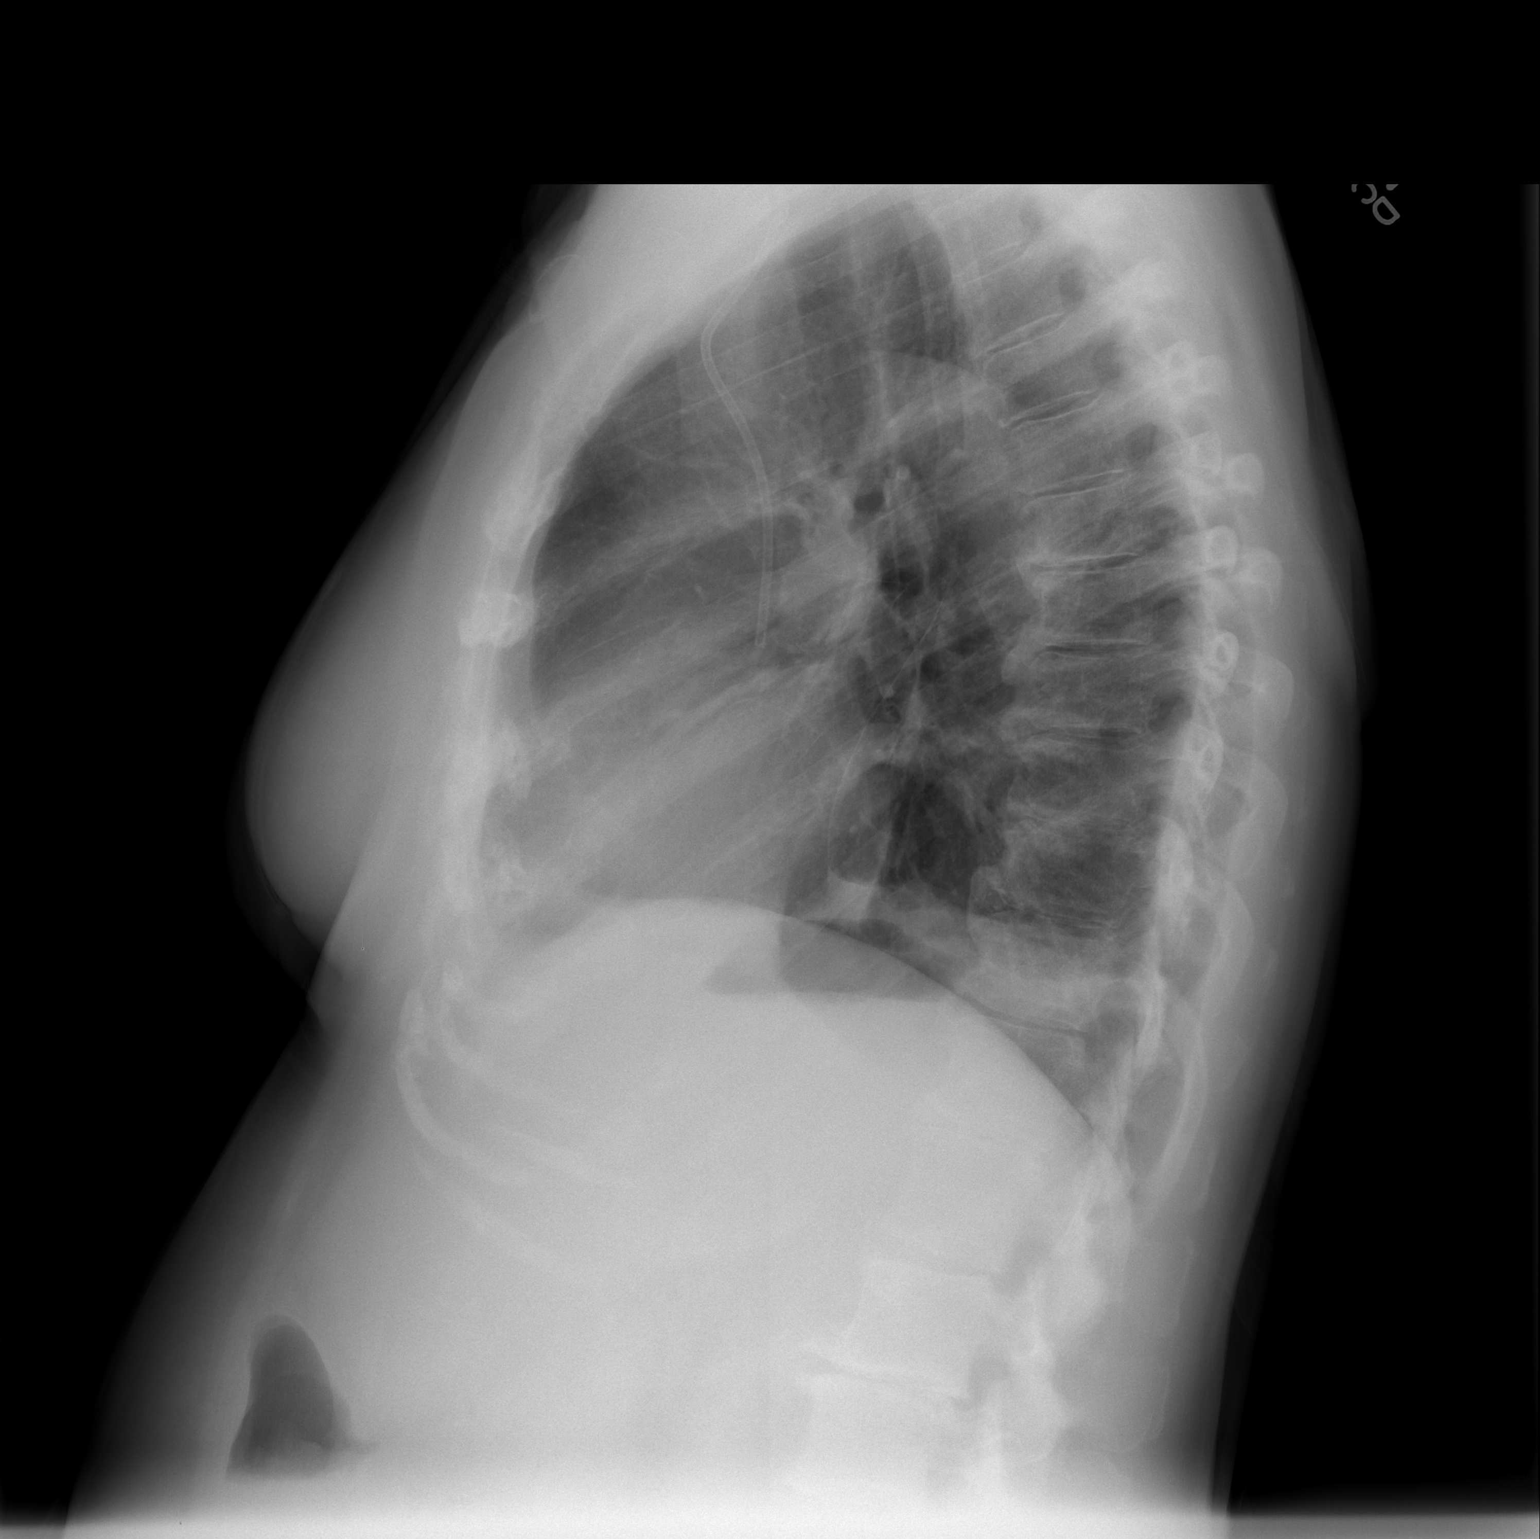

[2 of 2 positions shown; findings below may reference images not displayed]

FINDINGS: There has been decrease in volume of the small left pleural effusion
with mild left basilar atelectasis remaining. The right lung is
clear. Mediastinal and hilar contours are unremarkable. Left
Port-A-Cath is present with the tip seen to the mid SVC. The heart
is within normal limits in size. There are mild degenerative changes
in the mid to lower thoracic spine.
IMPRESSION: Decrease in volume of small left pleural effusion.

## 2016-06-12 IMAGING — US US PARACENTESIS
1 series · 8 of 8 positions shown · non-contrast
Comparison: None.

MEDICATIONS:
None.

COMPLICATIONS:
None immediate

INDICATION: Pancreatic cancer, ascites. Request is made for diagnostic and
therapeutic paracentesis.

EXAM:
ULTRASOUND-GUIDED DIAGNOSTIC AND THERAPEUTIC PARACENTESIS
TECHNIQUE: Informed written consent was obtained from the patient after a
discussion of the risks, benefits and alternatives to treatment. A
timeout was performed prior to the initiation of the procedure.

[Series 1: us paracentesis · 0.27mm/px · 8 of 8 slices shown]
[im 1/8]
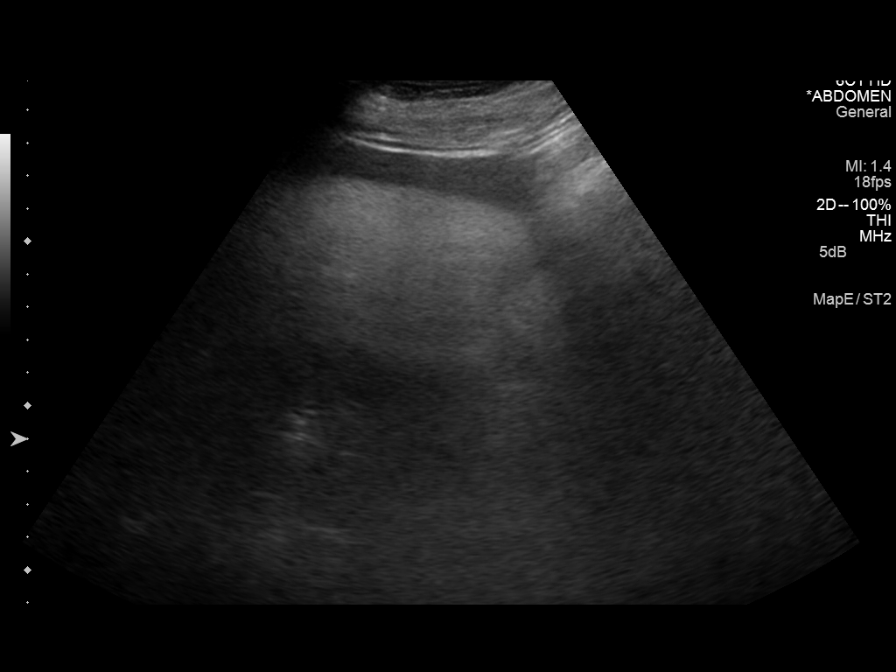
[im 2/8]
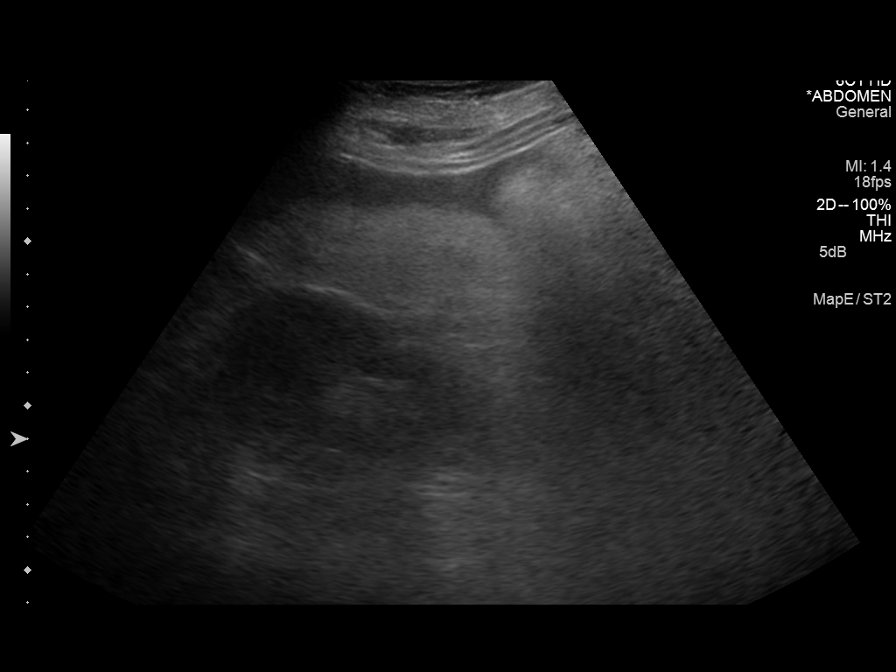
[im 3/8]
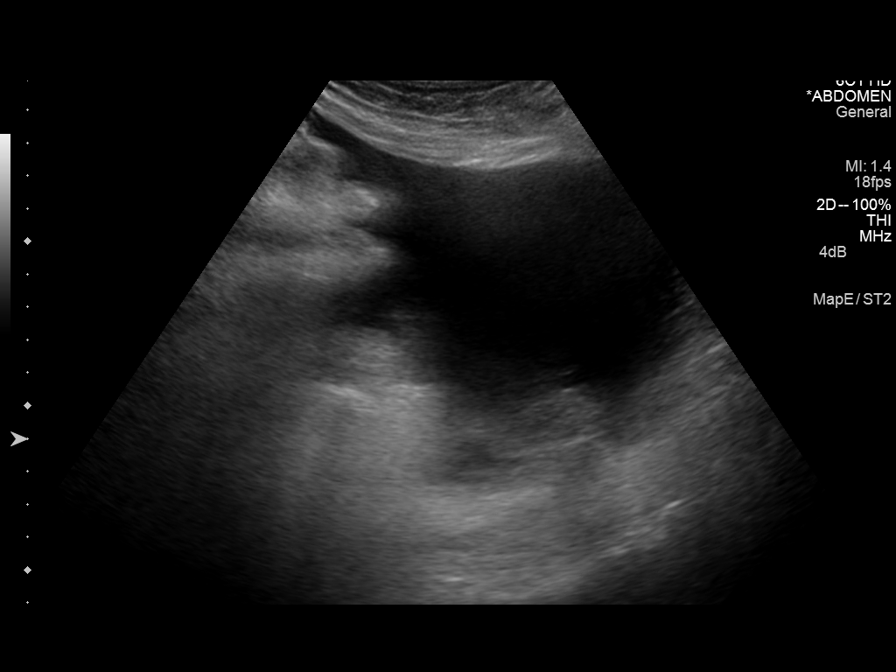
[im 4/8]
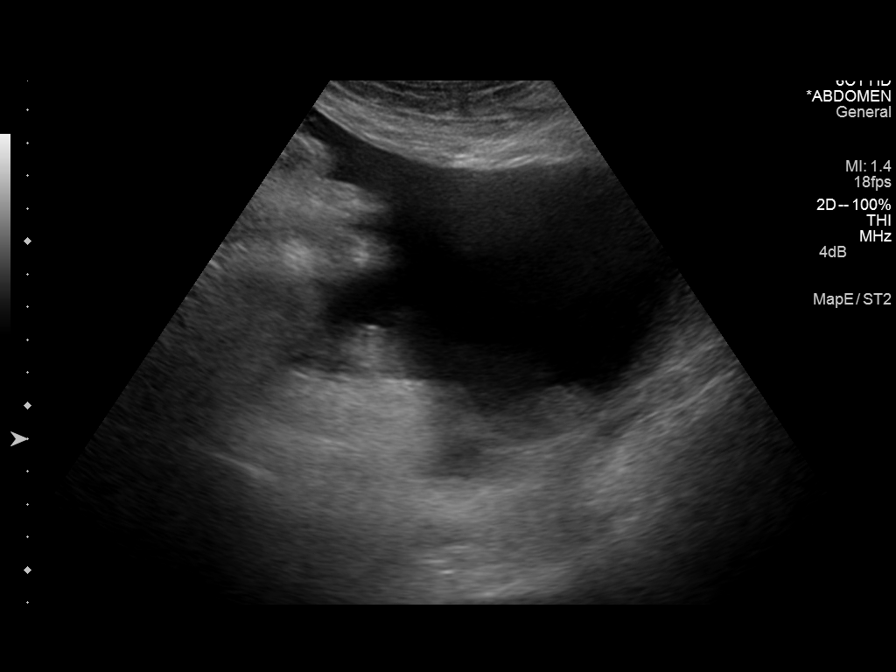
[im 5/8]
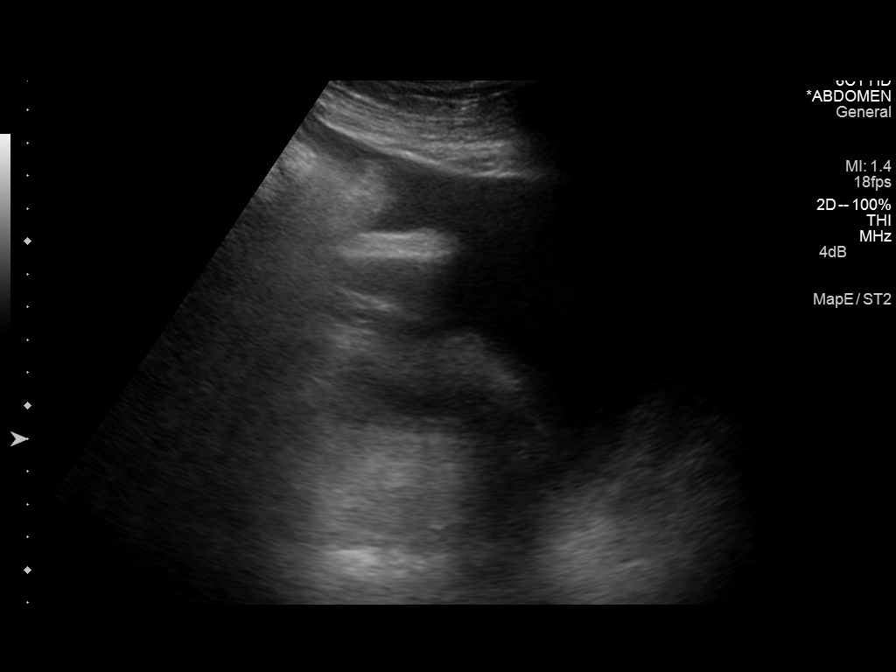
[im 6/8]
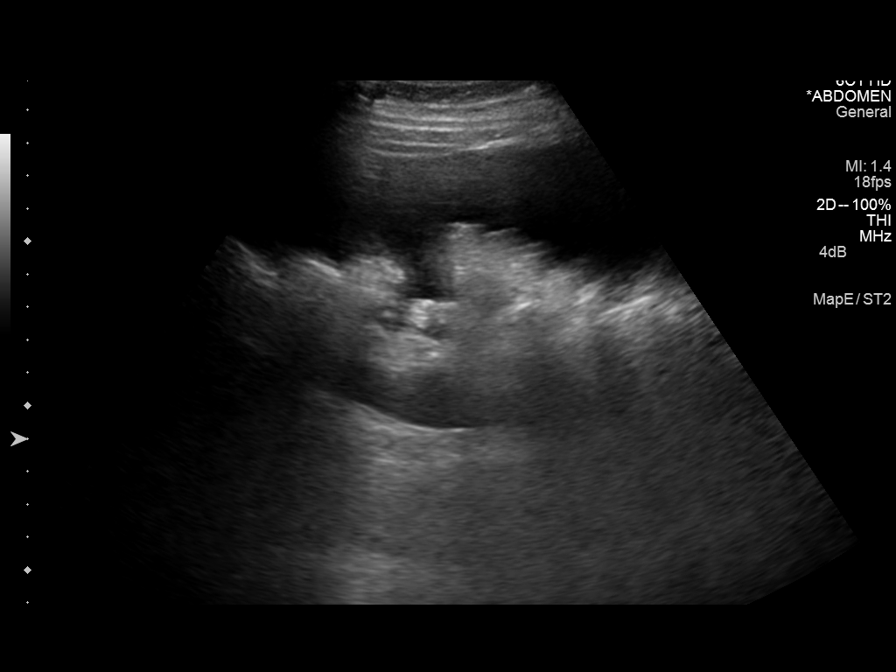
[im 7/8]
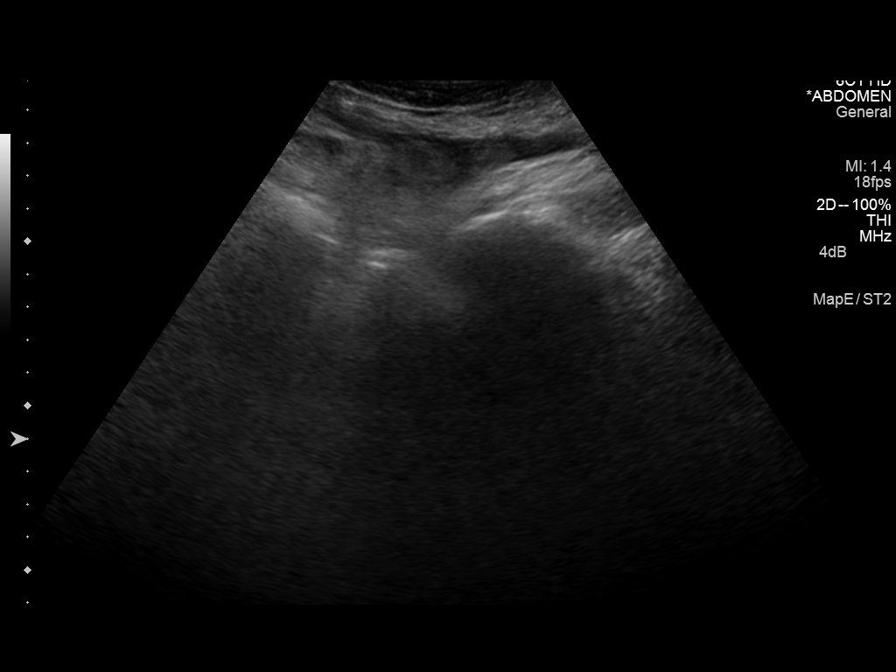
[im 8/8]
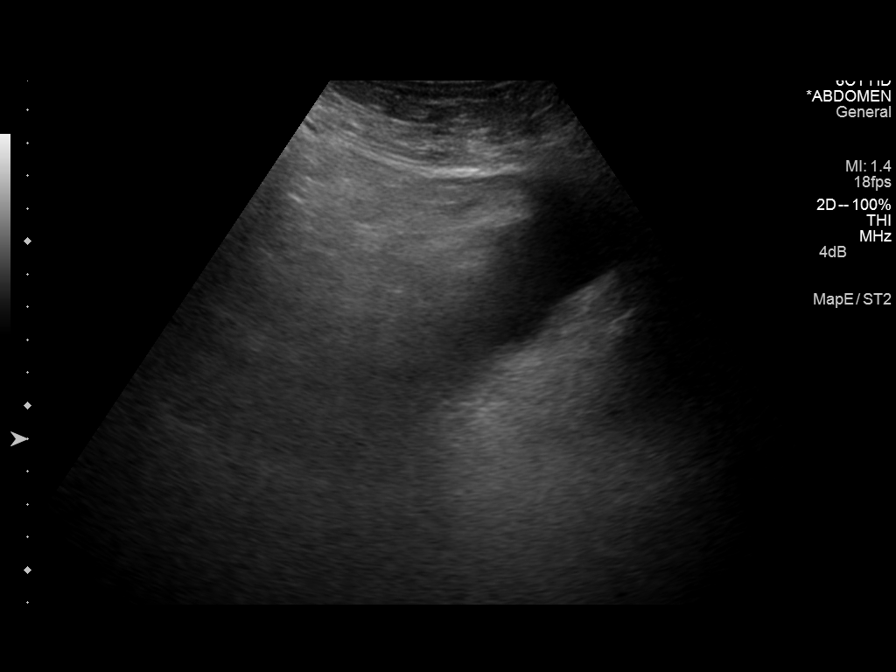

[8 of 8 positions shown; findings below may reference images not displayed]

Initial ultrasound scanning demonstrates a small to moderate amount
of ascites within the right lower abdominal quadrant. The right
lower abdomen was prepped and draped in the usual sterile fashion.
1% lidocaine was used for local anesthesia. Under direct ultrasound
guidance, a 19 gauge, 10-cm, Yueh catheter was introduced. An
ultrasound image was saved for documentation purposed. The
paracentesis was performed. The catheter was removed and a dressing
was applied. The patient tolerated the procedure well without
immediate post procedural complication.
FINDINGS: A total of approximately 2.1 liters of clear, yellow fluid was
removed. Samples were sent to the laboratory as requested by the
clinical team.
IMPRESSION: Successful ultrasound-guided diagnostic and therapeutic paracentesis
yielding 2.1 liters of peritoneal fluid.
